# Patient Record
Sex: Female | Born: 1970
Health system: Southern US, Community
[De-identification: ages and names within clinical notes are randomized; demographics above are authoritative.]

## PROBLEM LIST (undated history)

## (undated) ENCOUNTER — Emergency Department (HOSPITAL_BASED_OUTPATIENT_CLINIC_OR_DEPARTMENT_OTHER): Payer: Self-pay

## (undated) ENCOUNTER — Emergency Department (HOSPITAL_BASED_OUTPATIENT_CLINIC_OR_DEPARTMENT_OTHER): Admission: EM | Payer: Self-pay | Source: Home / Self Care

## (undated) DIAGNOSIS — C50919 Malignant neoplasm of unspecified site of unspecified female breast: Secondary | ICD-10-CM

## (undated) DIAGNOSIS — R112 Nausea with vomiting, unspecified: Secondary | ICD-10-CM

## (undated) DIAGNOSIS — E785 Hyperlipidemia, unspecified: Secondary | ICD-10-CM

## (undated) DIAGNOSIS — R87619 Unspecified abnormal cytological findings in specimens from cervix uteri: Secondary | ICD-10-CM

## (undated) DIAGNOSIS — K219 Gastro-esophageal reflux disease without esophagitis: Secondary | ICD-10-CM

## (undated) DIAGNOSIS — Z9889 Other specified postprocedural states: Secondary | ICD-10-CM

## (undated) DIAGNOSIS — E119 Type 2 diabetes mellitus without complications: Secondary | ICD-10-CM

## (undated) DIAGNOSIS — C801 Malignant (primary) neoplasm, unspecified: Secondary | ICD-10-CM

## (undated) DIAGNOSIS — T7840XA Allergy, unspecified, initial encounter: Secondary | ICD-10-CM

## (undated) DIAGNOSIS — J45909 Unspecified asthma, uncomplicated: Secondary | ICD-10-CM

## (undated) HISTORY — DX: Malignant (primary) neoplasm, unspecified: C80.1

## (undated) HISTORY — PX: TUBAL LIGATION: SHX77

## (undated) HISTORY — DX: Unspecified asthma, uncomplicated: J45.909

## (undated) HISTORY — DX: Malignant neoplasm of unspecified site of unspecified female breast: C50.919

## (undated) HISTORY — DX: Hyperlipidemia, unspecified: E78.5

## (undated) HISTORY — PX: APPENDECTOMY: SHX54

## (undated) HISTORY — DX: Gastro-esophageal reflux disease without esophagitis: K21.9

## (undated) HISTORY — DX: Type 2 diabetes mellitus without complications: E11.9

## (undated) HISTORY — DX: Allergy, unspecified, initial encounter: T78.40XA

## (undated) HISTORY — DX: Unspecified abnormal cytological findings in specimens from cervix uteri: R87.619

---

## 1994-07-21 HISTORY — PX: LAPAROSCOPIC SALPINGO OOPHERECTOMY: SHX5927

## 1994-07-21 HISTORY — PX: OTHER SURGICAL HISTORY: SHX169

## 1998-07-21 HISTORY — PX: CHOLECYSTECTOMY: SHX55

## 2005-07-21 DIAGNOSIS — C50919 Malignant neoplasm of unspecified site of unspecified female breast: Secondary | ICD-10-CM

## 2005-07-21 HISTORY — DX: Malignant neoplasm of unspecified site of unspecified female breast: C50.919

## 2006-07-21 HISTORY — PX: BREAST LUMPECTOMY: SHX2

## 2006-11-03 DIAGNOSIS — R1011 Right upper quadrant pain: Secondary | ICD-10-CM

## 2006-11-03 HISTORY — DX: Right upper quadrant pain: R10.11

## 2007-07-22 HISTORY — PX: ENDOMETRIAL ABLATION: SHX621

## 2009-10-03 DIAGNOSIS — D126 Benign neoplasm of colon, unspecified: Secondary | ICD-10-CM | POA: Insufficient documentation

## 2009-10-03 HISTORY — DX: Benign neoplasm of colon, unspecified: D12.6

## 2012-01-06 DIAGNOSIS — Z8041 Family history of malignant neoplasm of ovary: Secondary | ICD-10-CM

## 2012-01-06 DIAGNOSIS — Z803 Family history of malignant neoplasm of breast: Secondary | ICD-10-CM | POA: Insufficient documentation

## 2012-01-06 HISTORY — DX: Family history of malignant neoplasm of breast: Z80.3

## 2012-01-06 HISTORY — DX: Family history of malignant neoplasm of ovary: Z80.41

## 2012-08-02 DIAGNOSIS — G43909 Migraine, unspecified, not intractable, without status migrainosus: Secondary | ICD-10-CM | POA: Insufficient documentation

## 2012-08-02 HISTORY — DX: Migraine, unspecified, not intractable, without status migrainosus: G43.909

## 2012-08-13 DIAGNOSIS — D279 Benign neoplasm of unspecified ovary: Secondary | ICD-10-CM

## 2012-08-13 HISTORY — DX: Benign neoplasm of unspecified ovary: D27.9

## 2013-12-21 DIAGNOSIS — R110 Nausea: Secondary | ICD-10-CM | POA: Insufficient documentation

## 2013-12-21 HISTORY — DX: Nausea: R11.0

## 2014-05-26 ENCOUNTER — Ambulatory Visit (INDEPENDENT_AMBULATORY_CARE_PROVIDER_SITE_OTHER): Payer: BC Managed Care – PPO | Admitting: Medical

## 2014-05-26 ENCOUNTER — Encounter: Payer: Self-pay | Admitting: Medical

## 2014-05-26 ENCOUNTER — Ambulatory Visit (HOSPITAL_BASED_OUTPATIENT_CLINIC_OR_DEPARTMENT_OTHER)
Admission: RE | Admit: 2014-05-26 | Discharge: 2014-05-26 | Disposition: A | Discharge status: Home / Self Care | Payer: BC Managed Care – PPO | Source: Ambulatory Visit | Attending: Medical | Admitting: Medical

## 2014-05-26 VITALS — BP 143/87 | HR 99 | Temp 98.6°F | Ht 64.5 in | Wt 179.6 lb

## 2014-05-26 DIAGNOSIS — R062 Wheezing: Secondary | ICD-10-CM | POA: Insufficient documentation

## 2014-05-26 DIAGNOSIS — R5383 Other fatigue: Secondary | ICD-10-CM

## 2014-05-26 DIAGNOSIS — R079 Chest pain, unspecified: Secondary | ICD-10-CM | POA: Diagnosis not present

## 2014-05-26 DIAGNOSIS — R600 Localized edema: Secondary | ICD-10-CM

## 2014-05-26 LAB — CBC WITH DIFFERENTIAL/PLATELET
BASOS ABS: 0 10*3/uL (ref 0.0–0.1)
Basophils Relative: 0.5 % (ref 0.0–3.0)
EOS ABS: 0.2 10*3/uL (ref 0.0–0.7)
Eosinophils Relative: 2.3 % (ref 0.0–5.0)
HEMATOCRIT: 41.2 % (ref 36.0–46.0)
Hemoglobin: 13.6 g/dL (ref 12.0–15.0)
LYMPHS ABS: 2.9 10*3/uL (ref 0.7–4.0)
Lymphocytes Relative: 30.2 % (ref 12.0–46.0)
MCHC: 33.1 g/dL (ref 30.0–36.0)
MCV: 90.4 fl (ref 78.0–100.0)
MONO ABS: 0.5 10*3/uL (ref 0.1–1.0)
Monocytes Relative: 4.8 % (ref 3.0–12.0)
NEUTROS PCT: 62.2 % (ref 43.0–77.0)
Neutro Abs: 6 10*3/uL (ref 1.4–7.7)
Platelets: 268 10*3/uL (ref 150.0–400.0)
RBC: 4.56 Mil/uL (ref 3.87–5.11)
RDW: 13 % (ref 11.5–15.5)
WBC: 9.6 10*3/uL (ref 4.0–10.5)

## 2014-05-26 LAB — COMPREHENSIVE METABOLIC PANEL
ALBUMIN: 3.4 g/dL — AB (ref 3.5–5.2)
ALK PHOS: 66 U/L (ref 39–117)
ALT: 22 U/L (ref 0–35)
AST: 22 U/L (ref 0–37)
BILIRUBIN TOTAL: 0.3 mg/dL (ref 0.2–1.2)
BUN: 11 mg/dL (ref 6–23)
CO2: 27 mEq/L (ref 19–32)
Calcium: 9.1 mg/dL (ref 8.4–10.5)
Chloride: 106 mEq/L (ref 96–112)
Creatinine, Ser: 0.8 mg/dL (ref 0.4–1.2)
GFR: 84.45 mL/min (ref >60.00)
Glucose, Bld: 103 mg/dL — ABNORMAL HIGH (ref 70–99)
POTASSIUM: 4 meq/L (ref 3.5–5.1)
Sodium: 139 mEq/L (ref 135–145)
TOTAL PROTEIN: 7 g/dL (ref 6.0–8.3)

## 2014-05-26 LAB — TSH: TSH: 2.1 u[IU]/mL (ref 0.35–4.50)

## 2014-05-26 LAB — T4, FREE: Free T4: 0.76 ng/dL (ref 0.60–1.60)

## 2014-05-26 NOTE — Assessment & Plan Note (Signed)
Mild with recent weight gain. Get cbc,cmp, and tsh. Follow labs and recheck in 2 wks.

## 2014-05-26 NOTE — Patient Instructions (Signed)
For your weight gain and pedal edema reported I will order cxr and thyroid studies.  For you recent fatigue, I will get cbc and cmp.  Keep follow up appointment with [facility] for follow up on your ovarian cancer. If you get abdominal bloating or very tight pants with weight gain the would recommend quicker eval with specialsit before December(when routing follow up is).  Follow up here in 2 wks or as needed.

## 2014-05-26 NOTE — Progress Notes (Signed)
Subjective:    Patient ID: [name], female    DOB: 1970/11/06, 43 y.o.   MRN: [id number]  HPI   Pt here first time. Pmh, psh, fh, sh and social history.  Allergies- spring and fall.  Asthma- rare when sick. Controlled since teens. Breast cancer- 2006. Lumpectomy and radiation. Pt gets checked by oncologist every 6 month at [facility]. Ovarian cancer- 1996. Pt has left ovary. Rt ovary removed. GERD- controlled. No symptoms but found on egd on work up for gallbladder.  Pt states recent some swelling hands and her feet. She states gained 15 pounds in 2 wks. Pt states no change in diet. Denies high salt diet. Pt feel mid dyspnea if running around a lot. Lower ext worse than hands. Pt weight 179. 2 wks ago 161. Pt has been weighing herself on same scale.   LMP- 5 yrs ago after   Past Medical History  Diagnosis Date  . Allergy   . Asthma     HX  . Cancer     Breast and Ovarian  . GERD (gastroesophageal reflux disease)     History   Social History  . Marital Status: Married    Spouse Name: N/A    Number of Children: N/A  . Years of Education: N/A   Occupational History  . Not on file.   Social History Main Topics  . Smoking status: Never Smoker   . Smokeless tobacco: Never Used  . Alcohol Use: 0.0 oz/week    0 Not specified per week  . Drug Use: No  . Sexual Activity: Yes   Other Topics Concern  . Not on file   Social History Narrative  . No narrative on file    Past Surgical History  Procedure Laterality Date  . Appendectomy    . Cholecystectomy    . Breast lumpectomy    . Laparoscopic oopherectomy    . Endometrial ablation      5 yrs ago. No menses since.    Family History  Problem Relation Age of Onset  . Diabetes Mother     Allergies not on file  No current outpatient prescriptions on file prior to visit.   No current facility-administered medications on file prior to visit.    BP 143/87 mmHg  Pulse 99  Temp(Src) 98.6 F (37 C) (Oral)   Ht 5' 4.5" (1.638 m)  Wt 179 lb 9.6 oz (81.466 kg)  BMI 30.36 kg/m2  SpO2 99%        Review of Systems  Constitutional: Positive for fatigue and unexpected weight change. Negative for fever and chills.  HENT: Negative.   Respiratory: Negative for cough, chest tightness, shortness of breath and wheezing.        None know but rare occasional winded on acitivity.  Cardiovascular: Negative for chest pain and palpitations.  Gastrointestinal: Negative.   Genitourinary: Negative.   Musculoskeletal: Negative.   Neurological: Negative.   Hematological: Negative for adenopathy. Does not bruise/bleed easily.  Psychiatric/Behavioral: Negative.        Objective:   Physical Exam  Constitutional: She is oriented to person, place, and time. She appears well-developed and well-nourished. No distress.  HENT:  Head: Normocephalic and atraumatic.  Eyes: Conjunctivae are normal. Pupils are equal, round, and reactive to light.  Neck: Normal range of motion. Neck supple. No JVD present. No tracheal deviation present. No thyromegaly present.  Cardiovascular: Normal rate and regular rhythm.   Pulmonary/Chest: Effort normal and breath sounds normal. No stridor.  Abdominal: Soft. Bowel sounds are normal. She exhibits no distension and no mass. There is no tenderness. There is no rebound and no guarding.  No ascites.  Musculoskeletal:  No pedal edema presently. Neg homans signs.  Lymphadenopathy:    She has no cervical adenopathy.  Neurological: She is alert and oriented to person, place, and time. No cranial nerve deficit. Coordination normal.  Skin: Skin is warm and dry. She is not diaphoretic.  Psychiatric: She has a normal mood and affect. Her behavior is normal. Judgment and thought content normal.           Assessment & Plan:

## 2014-05-26 NOTE — Assessment & Plan Note (Signed)
With weight gain will get a cxr today.

## 2014-05-26 NOTE — Progress Notes (Signed)
Pre visit review using our clinic review tool, if applicable. No additional management support is needed unless otherwise documented below in the visit note. 

## 2014-06-09 ENCOUNTER — Encounter: Payer: Self-pay | Admitting: Medical

## 2014-06-09 ENCOUNTER — Ambulatory Visit (INDEPENDENT_AMBULATORY_CARE_PROVIDER_SITE_OTHER): Payer: BC Managed Care – PPO | Admitting: Medical

## 2014-06-09 VITALS — BP 118/83 | HR 98 | Temp 98.0°F | Ht 65.0 in | Wt 175.8 lb

## 2014-06-09 DIAGNOSIS — Z8543 Personal history of malignant neoplasm of ovary: Secondary | ICD-10-CM

## 2014-06-09 DIAGNOSIS — R600 Localized edema: Secondary | ICD-10-CM

## 2014-06-09 DIAGNOSIS — R5383 Other fatigue: Secondary | ICD-10-CM

## 2014-06-09 NOTE — Progress Notes (Signed)
Subjective:    Patient ID: Carolyn Wilson, female    DOB: Nov 23, 1970, 43 y.o.   MRN: [id number]  HPI   Pt states she still is feeling a little tired but better energy. Pt states leg swelling is still present to some degree. Pt is accountant and sits all day. For her fatigue and pedal edema we did cxr and labs. All were normal. Only mild high bs but minimal.  Pt states from last visit to this visit her pants feel about the same. By pt scale at home she has lost 3 pounds. Pt has repeat mri, [place] and lab work in January for follow up of her ovarian cancer history.  Past Medical History  Diagnosis Date  . Allergy   . Asthma     HX  . Cancer     Breast and Ovarian  . GERD (gastroesophageal reflux disease)     History   Social History  . Marital Status: Married    Spouse Name: N/A    Number of Children: N/A  . Years of Education: N/A   Occupational History  . Not on file.   Social History Main Topics  . Smoking status: Never Smoker   . Smokeless tobacco: Never Used  . Alcohol Use: 0.0 oz/week    0 Not specified per week  . Drug Use: No  . Sexual Activity: Yes   Other Topics Concern  . Not on file   Social History Narrative    Past Surgical History  Procedure Laterality Date  . Appendectomy    . Cholecystectomy    . Breast lumpectomy    . Laparoscopic oopherectomy    . Endometrial ablation      5 yrs ago. No menses since.    Family History  Problem Relation Age of Onset  . Diabetes Mother     Not on File  Current Outpatient Prescriptions on File Prior to Visit  Medication Sig Dispense Refill  . esomeprazole (NEXIUM) 40 MG capsule Take 40 mg by mouth daily at 12 noon.     No current facility-administered medications on file prior to visit.    BP 118/83 mmHg  Pulse 98  Temp(Src) 98 F (36.7 C) (Oral)  Ht 5\' 5"  (1.651 m)  Wt 175 lb 12.8 oz (79.742 kg)  BMI 29.25 kg/m2  SpO2 95%         Review of Systems  Constitutional: Positive for  fatigue. Negative for fever and chills.       Better but still mild.  Respiratory: Negative for cough, chest tightness, shortness of breath and wheezing.   Cardiovascular: Negative for chest pain and palpitations.  Gastrointestinal: Negative for nausea, vomiting, abdominal pain, diarrhea and constipation.  Genitourinary: Negative for dysuria and flank pain.  Musculoskeletal:       Pedal edema better.  Hematological: Negative for adenopathy. Does not bruise/bleed easily.  Psychiatric/Behavioral: Negative for suicidal ideas.       Objective:   Physical Exam  Constitutional: She is oriented to person, place, and time. She appears well-developed and well-nourished. No distress.  HENT:  Head: Normocephalic.  Nose: Nose normal.  Eyes: Conjunctivae and EOM are normal. Pupils are equal, round, and reactive to light. Right eye exhibits no discharge. Left eye exhibits no discharge.  Neck: Normal range of motion. Neck supple. No JVD present. No tracheal deviation present. No thyromegaly present.  Cardiovascular: Normal rate, regular rhythm, normal heart sounds and intact distal pulses.  Exam reveals no gallop and no  friction rub.   No murmur heard. Pulmonary/Chest: Effort normal and breath sounds normal. No stridor. No respiratory distress. She has no wheezes. She has no rales. She exhibits no tenderness.  Abdominal: Soft. Bowel sounds are normal. She exhibits no distension and no mass. There is no tenderness. There is no rebound and no guarding.  Musculoskeletal: Normal range of motion. She exhibits no edema or tenderness.  On exam. I really don't see any pedal edema. At very best on palpation maybe 1 + pedal edema pretibial area.  Negative homans signs.  Lymphadenopathy:    She has no cervical adenopathy.  Neurological: She is alert and oriented to person, place, and time. She has normal reflexes. No cranial nerve deficit. Coordination normal.  Skin: Skin is dry. No rash noted. She is not  diaphoretic. No erythema. No pallor.  Psychiatric: She has a normal mood and affect. Her behavior is normal. Judgment and thought content normal.             Assessment & Plan:

## 2014-06-09 NOTE — Progress Notes (Signed)
Pre visit review using our clinic review tool, if applicable. No additional management support is needed unless otherwise documented below in the visit note. 

## 2014-06-09 NOTE — Assessment & Plan Note (Signed)
Your work up for prior fatigue was negative. And cxr done for pedal edema was negative. Since you have ovarian cancer hx, report weight gain recently in short time which sounds abnormal and your pants feel tight, I do want you to contact your specialist and talk to his nurse. See if appointment in January needs to be moved up for imaging studies.

## 2014-06-09 NOTE — Assessment & Plan Note (Signed)
  Some better and lab negative. Only mild bs elevation. Advised pt regarding diet and exercise.

## 2014-06-09 NOTE — Assessment & Plan Note (Signed)
cxr normal and I don't appreciate any significant pedal edema today.

## 2014-06-09 NOTE — Patient Instructions (Signed)
Your work up for prior fatigue was negative. And cxr done for pedal edema was negative. Since you have ovarian cancer hx, report weight gain recently in short time which sounds abnormal and your pants feel tight, I do want you to contact your specialist and talk to his nurse. See if appointment in January needs to be moved up for imaging studies.  You have had recent labs and if you want you could schedule for wellness exam. At that appointment lipid panel could be done. Please get copies of mammograms and papsmears.  Follow up 3 months or as needed.

## 2014-06-20 ENCOUNTER — Ambulatory Visit: Payer: BC Managed Care – PPO | Admitting: Physician Assistant

## 2014-06-20 ENCOUNTER — Telehealth: Payer: Self-pay | Admitting: Medical

## 2014-06-20 NOTE — Telephone Encounter (Signed)
Pt requesting a referral to ENT, please advise

## 2014-06-23 NOTE — Telephone Encounter (Signed)
Called patient left message regarding symptoms she is having for the need to be referred to ENT.

## 2014-06-26 ENCOUNTER — Other Ambulatory Visit: Payer: Self-pay

## 2014-06-27 NOTE — Telephone Encounter (Signed)
Called patient again. (2nd call) Advised I called on 4th but had not received call back as to what symptoms she was having that warranted referral to ENT. Per ES nothing was mentioned to him in OV.

## 2014-08-10 ENCOUNTER — Encounter: Payer: Self-pay | Admitting: Internal Medicine

## 2014-08-10 ENCOUNTER — Encounter (INDEPENDENT_AMBULATORY_CARE_PROVIDER_SITE_OTHER): Payer: Self-pay

## 2014-08-10 ENCOUNTER — Ambulatory Visit (INDEPENDENT_AMBULATORY_CARE_PROVIDER_SITE_OTHER): Payer: BLUE CROSS/BLUE SHIELD | Admitting: Internal Medicine

## 2014-08-10 ENCOUNTER — Other Ambulatory Visit: Payer: Self-pay | Admitting: *Deleted

## 2014-08-10 VITALS — BP 127/85 | HR 93 | Resp 16 | Ht 64.5 in | Wt 184.0 lb

## 2014-08-10 DIAGNOSIS — Z800 Family history of malignant neoplasm of digestive organs: Secondary | ICD-10-CM

## 2014-08-10 DIAGNOSIS — Z1371 Encounter for nonprocreative screening for genetic disease carrier status: Secondary | ICD-10-CM

## 2014-08-10 DIAGNOSIS — Z9889 Other specified postprocedural states: Secondary | ICD-10-CM

## 2014-08-10 DIAGNOSIS — R87619 Unspecified abnormal cytological findings in specimens from cervix uteri: Secondary | ICD-10-CM | POA: Diagnosis not present

## 2014-08-10 DIAGNOSIS — K219 Gastro-esophageal reflux disease without esophagitis: Secondary | ICD-10-CM

## 2014-08-10 DIAGNOSIS — R197 Diarrhea, unspecified: Secondary | ICD-10-CM

## 2014-08-10 DIAGNOSIS — C562 Malignant neoplasm of left ovary: Secondary | ICD-10-CM | POA: Diagnosis not present

## 2014-08-10 DIAGNOSIS — Z0000 Encounter for general adult medical examination without abnormal findings: Secondary | ICD-10-CM

## 2014-08-10 DIAGNOSIS — C50912 Malignant neoplasm of unspecified site of left female breast: Secondary | ICD-10-CM

## 2014-08-10 DIAGNOSIS — T7801XA Anaphylactic reaction due to peanuts, initial encounter: Secondary | ICD-10-CM

## 2014-08-10 MED ORDER — EPINEPHRINE 0.3 MG/0.3ML IJ SOAJ: INTRAMUSCULAR | Status: DC

## 2014-08-10 NOTE — Progress Notes (Signed)
Subjective:    Patient ID: Carolyn Wilson, female    DOB: May 25, 1971, 44 y.o.   MRN: [id number]  HPI Carolyn Wilson is a new pt here for first visit primary care  Former care by Gyn/Onc Dr. Manuela Schwartz [facility]  Has been in [place] for the past 4 months.  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  She is S/P lumpectomy for her breast CA and reports she did not have XRT or CTX.  She has not had any chemoprophylaxis at this point.  (Pt does not have have any of her prior records from Dr. Rosario Jacks at [facility])  She tells me she is BRCA1 and BRCA2 negative.   Also PMH of GERD, and post cholecystectomy Diarrhea, allergic rhinitis  She has anaphylaxis to peanuts'  FHof colon cancer in Father   She is S/P leep for abnormal pap  - again I have no prior records.   She wishes to find a new oncologist here.  Has been going to [facility] .  She reports she alternates breast MRI and 3D mm q 6 months.   She is going back to [facility] for her MRI soon.   She has not had chemoprophylaxis.  Strong FH of breast CA in [place],  Bricelyn and M first cousin   Peanut anaphylaxis not sure if epi-pen is expired  Allergies  Allergen Reactions  . Peanut-Containing Drug Products Anaphylaxis  . Adhesive [Tape]   . Reglan [Metoclopramide] Tinitus  . Ciprofloxacin Rash  . Sulfur Rash   Past Medical History  Diagnosis Date  . Allergy   . Asthma     HX  . Cancer     Breast and Ovarian  . GERD (gastroesophageal reflux disease)    Past Surgical History  Procedure Laterality Date  . Appendectomy    . Cholecystectomy    . Breast lumpectomy    . Laparoscopic oopherectomy    . Endometrial ablation      5 yrs ago. No menses since.   History   Social History  . Marital Status: Married    Spouse Name: N/A    Number of Children: N/A  . Years of Education: N/A   Occupational History  . Not on file.   Social  History Main Topics  . Smoking status: Never Smoker   . Smokeless tobacco: Never Used  . Alcohol Use: 0.0 oz/week    0 Not specified per week  . Drug Use: No  . Sexual Activity: Yes   Other Topics Concern  . Not on file   Social History Narrative   Family History  Problem Relation Age of Onset  . Diabetes Mother   . Colon cancer Father   . Breast cancer Maternal Aunt   . Ovarian cancer Maternal Aunt   . Breast cancer Maternal Grandmother   . Ovarian cancer Cousin   . Colon cancer Cousin   . Melanoma Cousin    Patient Active Problem List   Diagnosis Date Noted  . History of ovarian cancer 06/09/2014  . Fatigue 05/26/2014  . Pedal edema 05/26/2014   Current Outpatient Prescriptions on File Prior to Visit  Medication Sig Dispense Refill  . esomeprazole (NEXIUM) 40 MG capsule Take 40 mg by mouth daily at 12 noon.    . ondansetron (ZOFRAN) 4 MG tablet Take 4  mg by mouth every 8 (eight) hours as needed for nausea or vomiting.     No current facility-administered medications on file prior to visit.       Review of Systems See HPI    Objective:   Physical Exam Physical Exam  Nursing note and vitals reviewed.  Constitutional: She is oriented to person, place, and time. She appears well-developed and well-nourished.  HENT:  Head: Normocephalic and atraumatic.  Cardiovascular: Normal rate and regular rhythm. Exam reveals no gallop and no friction rub.  No murmur heard.  Pulmonary/Chest: Breath sounds normal. She has no wheezes. She has no rales.  Neurological: She is alert and oriented to person, place, and time.  Skin: Skin is warm and dry.  Psychiatric: She has a normal mood and affect. Her behavior is normal.              Assessment & Plan:  Breast CA left  Will have pt sign for prior records.  She will continue with [facility] until new oncologist established here.  Will refer.  Get all labs today  Ovarian CA will refer to "GYN oncology  GERD continue  Nexium  Anaphylaxis to peanuts  Will re-order Epi-pen  Post cholecystectomy diarrhea  On Bentyl for this  Will occasionally have nausea with this and uses Zofaran   FHcolon CA father   She has had colonoscopy [place]  abnromal pap  S/P remote LEEP 1996  Allergic rhintis  Schedule CPE

## 2014-08-13 DIAGNOSIS — Z800 Family history of malignant neoplasm of digestive organs: Secondary | ICD-10-CM

## 2014-08-13 DIAGNOSIS — T7801XA Anaphylactic reaction due to peanuts, initial encounter: Secondary | ICD-10-CM

## 2014-08-13 DIAGNOSIS — R87619 Unspecified abnormal cytological findings in specimens from cervix uteri: Secondary | ICD-10-CM | POA: Insufficient documentation

## 2014-08-13 DIAGNOSIS — C50919 Malignant neoplasm of unspecified site of unspecified female breast: Secondary | ICD-10-CM | POA: Insufficient documentation

## 2014-08-13 DIAGNOSIS — Z1371 Encounter for nonprocreative screening for genetic disease carrier status: Secondary | ICD-10-CM | POA: Insufficient documentation

## 2014-08-13 DIAGNOSIS — J309 Allergic rhinitis, unspecified: Secondary | ICD-10-CM

## 2014-08-13 DIAGNOSIS — R197 Diarrhea, unspecified: Secondary | ICD-10-CM

## 2014-08-13 DIAGNOSIS — K219 Gastro-esophageal reflux disease without esophagitis: Secondary | ICD-10-CM | POA: Insufficient documentation

## 2014-08-13 DIAGNOSIS — Z9889 Other specified postprocedural states: Secondary | ICD-10-CM

## 2014-08-13 DIAGNOSIS — C569 Malignant neoplasm of unspecified ovary: Secondary | ICD-10-CM | POA: Insufficient documentation

## 2014-08-13 HISTORY — DX: Anaphylactic reaction due to peanuts, initial encounter: T78.01XA

## 2014-08-13 HISTORY — DX: Diarrhea, unspecified: R19.7

## 2014-08-13 HISTORY — DX: Allergic rhinitis, unspecified: J30.9

## 2014-08-13 HISTORY — DX: Gastro-esophageal reflux disease without esophagitis: K21.9

## 2014-08-13 HISTORY — DX: Family history of malignant neoplasm of digestive organs: Z80.0

## 2014-08-13 HISTORY — DX: Other specified postprocedural states: Z98.890

## 2014-08-13 NOTE — Patient Instructions (Signed)
Schedule cpe 

## 2014-08-14 ENCOUNTER — Encounter: Payer: Self-pay | Admitting: Internal Medicine

## 2014-08-31 ENCOUNTER — Telehealth: Payer: Self-pay | Admitting: *Deleted

## 2014-08-31 NOTE — Telephone Encounter (Addendum)
Dr Emi Belfast office called to request records needed for New pt consult scheduled 09/01/2014 with Dr. Fermin Schwab. Spoke with Jiles Garter that stated that patient records were not available. Dr Juel Burrow office has not requested them.  Advised that the patient's records would need to be sent to our office before an appt could be made.  Appt reschduled for a later date and Dr. Nevada Crane office notified that records would be needed with pathology reports, etc.  She was going to tell the patient to call our office to reschedule once records received.

## 2014-09-01 ENCOUNTER — Ambulatory Visit: Payer: BLUE CROSS/BLUE SHIELD | Admitting: Gynecology

## 2014-09-15 ENCOUNTER — Telehealth: Payer: Self-pay | Admitting: Hematology & Oncology

## 2014-09-15 NOTE — Telephone Encounter (Signed)
Left vm w NEW PATIENT today to remind them of their appointment with Dr. Ennever. Also, advised them to bring all medication bottles and insurance card information. ° °

## 2014-09-18 ENCOUNTER — Encounter: Payer: Self-pay | Admitting: Hematology & Oncology

## 2014-09-18 ENCOUNTER — Other Ambulatory Visit (HOSPITAL_BASED_OUTPATIENT_CLINIC_OR_DEPARTMENT_OTHER): Payer: BLUE CROSS/BLUE SHIELD | Admitting: Lab

## 2014-09-18 ENCOUNTER — Ambulatory Visit (HOSPITAL_BASED_OUTPATIENT_CLINIC_OR_DEPARTMENT_OTHER): Payer: BLUE CROSS/BLUE SHIELD | Admitting: Hematology & Oncology

## 2014-09-18 ENCOUNTER — Ambulatory Visit: Payer: BLUE CROSS/BLUE SHIELD

## 2014-09-18 VITALS — BP 120/82 | HR 95 | Temp 98.4°F | Resp 14 | Ht 64.0 in | Wt 180.0 lb

## 2014-09-18 DIAGNOSIS — L989 Disorder of the skin and subcutaneous tissue, unspecified: Secondary | ICD-10-CM

## 2014-09-18 DIAGNOSIS — C50912 Malignant neoplasm of unspecified site of left female breast: Secondary | ICD-10-CM

## 2014-09-18 DIAGNOSIS — Z8543 Personal history of malignant neoplasm of ovary: Secondary | ICD-10-CM

## 2014-09-18 DIAGNOSIS — Z803 Family history of malignant neoplasm of breast: Secondary | ICD-10-CM

## 2014-09-18 DIAGNOSIS — C561 Malignant neoplasm of right ovary: Secondary | ICD-10-CM

## 2014-09-18 DIAGNOSIS — Z8041 Family history of malignant neoplasm of ovary: Secondary | ICD-10-CM

## 2014-09-18 LAB — CBC WITH DIFFERENTIAL (CANCER CENTER ONLY)
BASO#: 0.1 10*3/uL (ref 0.0–0.2)
BASO%: 0.6 % (ref 0.0–2.0)
EOS%: 2 % (ref 0.0–7.0)
Eosinophils Absolute: 0.2 10*3/uL (ref 0.0–0.5)
HEMATOCRIT: 42.3 % (ref 34.8–46.6)
HEMOGLOBIN: 14.4 g/dL (ref 11.6–15.9)
LYMPH#: 3.3 10*3/uL (ref 0.9–3.3)
LYMPH%: 36.7 % (ref 14.0–48.0)
MCH: 30.1 pg (ref 26.0–34.0)
MCHC: 34 g/dL (ref 32.0–36.0)
MCV: 89 fL (ref 81–101)
MONO#: 0.4 10*3/uL (ref 0.1–0.9)
MONO%: 4.5 % (ref 0.0–13.0)
NEUT#: 5.1 10*3/uL (ref 1.5–6.5)
NEUT%: 56.2 % (ref 39.6–80.0)
Platelets: 319 10*3/uL (ref 145–400)
RBC: 4.78 10*6/uL (ref 3.70–5.32)
RDW: 12.4 % (ref 11.1–15.7)
WBC: 9 10*3/uL (ref 3.9–10.0)

## 2014-09-18 NOTE — Progress Notes (Signed)
Referral MD  Reason for Referral: History of Sertoli-Leydig tumor of the left ovary   Chief Complaint  Patient presents with  . NEW PATIENT  : I just moved here from [place]. I need a oncologist.  HPI: Mrs. Martone is a very charming 44 year old white female. She is from [place] originally area and she and her family moved down to [place] recently. Per she's been getting follow-up at the [facility].  She has a very strong family history of breast and ovarian cancer. A paternal aunt had breast cancer at age 59 and a cousin died of breast cancer in her 82s.  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 She underwent left salpingo-oophorectomy and chemotherapy.  She had endometrial ablation in 2010.  She was tested for BRCA 1/2 and p53. These were negative.  She had a lumpectomy for a tumor in the left breast in 2007.  She's been getting MRIs and mammograms alternating every 6 months. She's been getting CA 125 tests with her appointments up in [place].  She was not interested in taking tamoxifen for breast cancer reduction.  She now is living down in [facility], we are seeing her to establish her oncology care locally.  She feels well. 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  She has not had any problems with skin issues. She's had no skin lesions removed.                                                                  She has had her gallbladder taken out. I think she's had her appendix removed.  She's had a problem with bowels or bladder. She does have a pancreatic issue in which she occasionally gets pancreatic duct blockage. She said that a stent was attempted without success. She said that last time she was hospitalized for this was 3 years ago.  She now is seeing Dr. Coralyn Mark downstairs for her internal medicine follow-up.  Overall, her performance status is  ECOG 0. She exercises for 5 times a day.                                                                                                             Past Medical History  Diagnosis Date  . Allergy   . Asthma     HX  . Cancer     Breast and Ovarian  . GERD (gastroesophageal reflux disease)   :  Past Surgical History  Procedure Laterality Date  . Appendectomy    . Cholecystectomy    . Breast lumpectomy    . Laparoscopic oopherectomy    . Endometrial ablation      5 yrs ago. No menses since.  :  Current outpatient prescriptions:  .  dicyclomine (BENTYL) 10 MG capsule, Take 10 mg by mouth 4 times daily (before meals and nightly),, Disp: , Rfl:  .  EPINEPHrine 0.3 mg/0.3 mL IJ SOAJ injection, Inject once into muscle for any allergic reaction to peanut exposure, Disp: 1 Device, Rfl: 1 .  esomeprazole (NEXIUM) 40 MG capsule, Take 40 mg by mouth daily at 12 noon., Disp: , Rfl:  .  Multiple Vitamin (MULTIVITAMIN) tablet, Take 1 tablet by mouth daily., Disp: , Rfl:  .  ondansetron (ZOFRAN) 4 MG tablet, Take 4 mg by mouth every 8 (eight) hours as needed for nausea or vomiting., Disp: , Rfl: :  :  Allergies  Allergen Reactions  . Peanut-Containing Drug Products Anaphylaxis  . Adhesive [Tape]   . Reglan [Metoclopramide] Tinitus  . Ciprofloxacin Rash  . Sulfur Rash  :  Family History  Problem Relation Age of Onset  . Diabetes Mother   . Colon cancer Father   . Breast cancer Maternal Aunt   . Ovarian cancer Maternal Aunt   . Breast cancer Maternal Grandmother   . Ovarian cancer Cousin   . Colon cancer Cousin   . Melanoma Cousin   :  History   Social History  . Marital Status: Married    Spouse Name: N/A  . Number of Children: N/A  . Years of Education: N/A   Occupational History  . Not on file.   Social History Main Topics  . Smoking status: Never Smoker   . Smokeless tobacco: Never Used     Comment: NEVER USED TOBACCO  . Alcohol Use: 0.0 oz/week    0  Standard drinks or equivalent per week  . Drug Use: No  . Sexual Activity: Yes   Other Topics Concern  . Not on file   Social History Narrative  :  Pertinent items are noted in HPI.  Exam: '@IPVITALS' @ Well-developed and well-nourished white female in no obvious distress. Vital signs show a temperature of 98.4. Pulse 95. Blood pressure 120/82. Weight is 180 pounds. Head and neck exam shows no ocular or oral lesions. She has no palpable cervical or supraclavicular lymph nodes. Lungs are clear. Cardiac exam regular rate and rhythm with no murmurs, rubs or bruits. Abdomen is soft. She has good bowel sounds. There is no fluid wave. She has a laparoscopy scars that are well-healed. Breast exam shows right breast with no masses, edema or erythema. There is no right axillary adenopathy. Left breast shows a well-healed lumpectomy at about the 2:00 position. No distinct masses noted in the left breast. There is no left axillary adenopathy. Back exam shows no tenderness over the spine, ribs or hips. Extremities shows no clubbing, cyanosis or edema. Skin exam shows no suspicious hyperpigmented lesions. There is a small dark lesion on her mid back just to the right of the spine. This probably measures about 3 mm. Neurological exam shows no focal deficits.    Recent Labs  09/18/14 1507  WBC 9.0  HGB 14.4  HCT 42.3  PLT 319   No results for input(s): NA, K, CL, CO2, GLUCOSE, BUN, CREATININE, CALCIUM in the last 72 hours.  l Bloodsmear review: none  Pathology:None    Assessment and Plan: [name] is a 44 year old premenopausal female. 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 This was back in 1996. She has no obvious history of breast cancer.  She certainly is at risk for disease. She has a strong family  history.  We will go ahead and set her up with a mammogram in July. She'll alternate mammograms with MRIs.  I talked to her about doing an extensive genetic  analysis. I talked her about the Myriad genetic profile that we can run. This test about 25 different mutations. I think it would be worthwhile to think about this. Again, she was tested for BRCA 1/2 an p53 which were normal.  She does have this skin lesion on her back. We will have to watch this closely.  I will plan to get her back in 6 months time.  I spent about 45 minutes with her today.

## 2014-09-19 ENCOUNTER — Telehealth: Payer: Self-pay | Admitting: *Deleted

## 2014-09-19 LAB — COMPREHENSIVE METABOLIC PANEL
ALK PHOS: 75 U/L (ref 39–117)
ALT: 20 U/L (ref 0–35)
AST: 20 U/L (ref 0–37)
Albumin: 4.4 g/dL (ref 3.5–5.2)
BUN: 8 mg/dL (ref 6–23)
CALCIUM: 9.9 mg/dL (ref 8.4–10.5)
CO2: 27 mEq/L (ref 19–32)
Chloride: 100 mEq/L (ref 96–112)
Creatinine, Ser: 0.83 mg/dL (ref 0.50–1.10)
GLUCOSE: 127 mg/dL — AB (ref 70–99)
POTASSIUM: 4.3 meq/L (ref 3.5–5.3)
SODIUM: 138 meq/L (ref 135–145)
TOTAL PROTEIN: 7.1 g/dL (ref 6.0–8.3)
Total Bilirubin: 0.4 mg/dL (ref 0.2–1.2)

## 2014-09-19 LAB — VITAMIN D 25 HYDROXY (VIT D DEFICIENCY, FRACTURES): Vit D, 25-Hydroxy: 25 ng/mL — ABNORMAL LOW (ref 30–100)

## 2014-09-19 LAB — CA 125: CA 125: 13 U/mL (ref <35)

## 2014-09-19 NOTE — Telephone Encounter (Addendum)
Message given. Patient will start supplement.   ----- Message from Carolyn Napoleon, MD sent at 09/19/2014  8:15 AM EST ----- Call - vit D is low!!!  Must be taking 2000units a day!!!  [name]

## 2014-09-20 ENCOUNTER — Ambulatory Visit: Payer: BLUE CROSS/BLUE SHIELD | Admitting: Internal Medicine

## 2014-09-22 ENCOUNTER — Telehealth: Payer: Self-pay | Admitting: Obstetrics & Gynecology

## 2014-09-22 NOTE — Telephone Encounter (Signed)
Called patient and left message to call back to schedule a new patient Dr. [name] Heck will see on behalf of Dr. Marin Olp.

## 2014-09-26 ENCOUNTER — Other Ambulatory Visit: Payer: Self-pay | Admitting: Hematology & Oncology

## 2014-09-26 DIAGNOSIS — Z1231 Encounter for screening mammogram for malignant neoplasm of breast: Secondary | ICD-10-CM

## 2014-09-28 ENCOUNTER — Encounter: Payer: Self-pay | Admitting: Hematology & Oncology

## 2014-10-04 ENCOUNTER — Telehealth: Payer: Self-pay | Admitting: Hematology & Oncology

## 2014-10-04 NOTE — Telephone Encounter (Addendum)
Pt called is getting disc of her scans sent to her, and said if I call [phone] at [facility] they will send the rest of the medical records. Bessie at [facility] will resend the records.

## 2014-10-05 ENCOUNTER — Encounter: Payer: Self-pay | Admitting: Hematology & Oncology

## 2014-10-05 ENCOUNTER — Telehealth: Payer: Self-pay | Admitting: Hematology & Oncology

## 2014-10-05 NOTE — Telephone Encounter (Signed)
Pt came in brought release of info to send to [facility] to mail disc of scans. I faxed it to [phone], phone [phone]. She also filled out our release of information for [place]. I scanned everything into Epic.

## 2014-10-11 ENCOUNTER — Encounter: Payer: Self-pay | Admitting: Hematology & Oncology

## 2014-10-31 ENCOUNTER — Encounter: Payer: Self-pay | Admitting: *Deleted

## 2014-11-03 ENCOUNTER — Telehealth: Payer: Self-pay | Admitting: *Deleted

## 2014-11-03 NOTE — Telephone Encounter (Signed)
Unable to reach patient at time of Pre-Visit Call.  Left message for patient to return call when available.    

## 2014-11-06 ENCOUNTER — Ambulatory Visit (INDEPENDENT_AMBULATORY_CARE_PROVIDER_SITE_OTHER): Payer: BLUE CROSS/BLUE SHIELD | Admitting: Medical

## 2014-11-06 ENCOUNTER — Telehealth: Payer: Self-pay | Admitting: Medical

## 2014-11-06 ENCOUNTER — Encounter: Payer: Self-pay | Admitting: Medical

## 2014-11-06 VITALS — BP 132/92 | HR 95 | Temp 98.1°F | Ht 64.0 in | Wt 178.6 lb

## 2014-11-06 DIAGNOSIS — R7989 Other specified abnormal findings of blood chemistry: Secondary | ICD-10-CM | POA: Diagnosis not present

## 2014-11-06 DIAGNOSIS — Z0000 Encounter for general adult medical examination without abnormal findings: Secondary | ICD-10-CM

## 2014-11-06 DIAGNOSIS — Z23 Encounter for immunization: Secondary | ICD-10-CM

## 2014-11-06 LAB — POCT URINALYSIS DIPSTICK
BILIRUBIN UA: NEGATIVE
GLUCOSE UA: NEGATIVE
Ketones, UA: NEGATIVE
LEUKOCYTES UA: NEGATIVE
Nitrite, UA: NEGATIVE
Protein, UA: NEGATIVE
RBC UA: NEGATIVE
Spec Grav, UA: >=1.03
Urobilinogen, UA: 4
pH, UA: 6

## 2014-11-06 LAB — CBC WITH DIFFERENTIAL/PLATELET
BASOS ABS: 0 10*3/uL (ref 0.0–0.1)
Basophils Relative: 0.5 % (ref 0.0–3.0)
EOS PCT: 2.1 % (ref 0.0–5.0)
Eosinophils Absolute: 0.2 10*3/uL (ref 0.0–0.7)
HCT: 42.5 % (ref 36.0–46.0)
Hemoglobin: 14.5 g/dL (ref 12.0–15.0)
Lymphocytes Relative: 26.4 % (ref 12.0–46.0)
Lymphs Abs: 2.1 10*3/uL (ref 0.7–4.0)
MCHC: 34.2 g/dL (ref 30.0–36.0)
MCV: 87.5 fl (ref 78.0–100.0)
MONO ABS: 0.3 10*3/uL (ref 0.1–1.0)
Monocytes Relative: 4.3 % (ref 3.0–12.0)
Neutro Abs: 5.4 10*3/uL (ref 1.4–7.7)
Neutrophils Relative %: 66.7 % (ref 43.0–77.0)
PLATELETS: 291 10*3/uL (ref 150.0–400.0)
RBC: 4.86 Mil/uL (ref 3.87–5.11)
RDW: 13.5 % (ref 11.5–15.5)
WBC: 8 10*3/uL (ref 4.0–10.5)

## 2014-11-06 LAB — COMPREHENSIVE METABOLIC PANEL
ALK PHOS: 84 U/L (ref 39–117)
ALT: 37 U/L — ABNORMAL HIGH (ref 0–35)
AST: 27 U/L (ref 0–37)
Albumin: 4.2 g/dL (ref 3.5–5.2)
BUN: 10 mg/dL (ref 6–23)
CO2: 28 mEq/L (ref 19–32)
CREATININE: 0.82 mg/dL (ref 0.40–1.20)
Calcium: 10.1 mg/dL (ref 8.4–10.5)
Chloride: 101 mEq/L (ref 96–112)
GFR: 80.72 mL/min (ref >60.00)
GLUCOSE: 132 mg/dL — AB (ref 70–99)
Potassium: 4.2 mEq/L (ref 3.5–5.1)
Sodium: 139 mEq/L (ref 135–145)
Total Bilirubin: 0.5 mg/dL (ref 0.2–1.2)
Total Protein: 7.3 g/dL (ref 6.0–8.3)

## 2014-11-06 LAB — LIPID PANEL
Cholesterol: 248 mg/dL — ABNORMAL HIGH (ref 0–200)
HDL: 40.4 mg/dL (ref >39.00)
NONHDL: 207.6
Total CHOL/HDL Ratio: 6
Triglycerides: 259 mg/dL — ABNORMAL HIGH (ref 0.0–149.0)
VLDL: 51.8 mg/dL — ABNORMAL HIGH (ref 0.0–40.0)

## 2014-11-06 LAB — TSH: TSH: 2.98 u[IU]/mL (ref 0.35–4.50)

## 2014-11-06 LAB — LDL CHOLESTEROL, DIRECT: Direct LDL: 165 mg/dL

## 2014-11-06 MED ORDER — SIMVASTATIN 20 MG PO TABS: 20.0000 mg | ORAL_TABLET | Freq: Every day | ORAL | Status: DC

## 2014-11-06 NOTE — Telephone Encounter (Signed)
rx simvistatin

## 2014-11-06 NOTE — Assessment & Plan Note (Signed)
Cbc, tsh, lipid, cmp. Tdap today. Pt had HIV screening in 2005. Will see gyn upcoming 1-2 weeks.

## 2014-11-06 NOTE — Progress Notes (Signed)
Pre visit review using our clinic review tool, if applicable. No additional management support is needed unless otherwise documented below in the visit note. 

## 2014-11-06 NOTE — Progress Notes (Signed)
Subjective:    Patient ID: Carolyn Wilson, female    DOB: 04-05-1971, 44 y.o.   MRN: [id number]  HPI  Pt in for physical. Pt states work and her insurance her to get a physical.    Pt has been exercising. Every day walks or runs. Pt diet good. Eats very healthy. No caffeine beverages. No smoke. No alcohol.  Pt has seen Dr. Marin Olp due to her history. Pt did follow up with oncologist after I last saw her.(hx of ovraian cancer)  Pt will see gynecologist for pap smear was 2 yrs ago. Last mammogram last July. Which was negative. Next one schedule in august.  Pt med records have been sent to Dr. Marin Olp.  Pt states HIV screening done in 2005 and negative.   Pt not sure when tdap done. I don't think we have full records. Pt states last time was [place]. So she is willing to get today.  Pt has some mild allergies recently.            Review of Systems  Constitutional: Negative for fever, chills and fatigue.  HENT: Positive for congestion and rhinorrhea. Negative for ear discharge, ear pain, nosebleeds, postnasal drip, sinus pressure, sore throat and trouble swallowing.   Respiratory: Negative for cough, chest tightness, shortness of breath and wheezing.   Cardiovascular: Negative for chest pain and palpitations.  Gastrointestinal: Negative for nausea, vomiting, abdominal pain, diarrhea and constipation.  Genitourinary: Negative for dysuria and flank pain.  Musculoskeletal: Negative for back pain.  Neurological: Negative for dizziness, tremors, seizures, syncope, numbness and headaches.  Hematological: Negative for adenopathy. Does not bruise/bleed easily.  Psychiatric/Behavioral: Negative for suicidal ideas and dysphoric mood. The patient is not nervous/anxious.    Past Medical History  Diagnosis Date  . Allergy   . Asthma     HX  . Cancer     Breast and Ovarian  . GERD (gastroesophageal reflux disease)     History   Social History  . Marital Status:  Married    Spouse Name: N/A  . Number of Children: N/A  . Years of Education: N/A   Occupational History  . Not on file.   Social History Main Topics  . Smoking status: Never Smoker   . Smokeless tobacco: Never Used     Comment: NEVER USED TOBACCO  . Alcohol Use: 0.0 oz/week    0 Standard drinks or equivalent per week  . Drug Use: No  . Sexual Activity: Yes   Other Topics Concern  . Not on file   Social History Narrative    Past Surgical History  Procedure Laterality Date  . Appendectomy    . Cholecystectomy    . Breast lumpectomy    . Laparoscopic oopherectomy    . Endometrial ablation      5 yrs ago. No menses since.    Family History  Problem Relation Age of Onset  . Diabetes Mother   . Colon cancer Father   . Breast cancer Maternal Aunt   . Ovarian cancer Maternal Aunt   . Breast cancer Maternal Grandmother   . Ovarian cancer Cousin   . Colon cancer Cousin   . Melanoma Cousin     Allergies  Allergen Reactions  . Peanut-Containing Drug Products Anaphylaxis  . Adhesive [Tape]   . Reglan [Metoclopramide] Tinitus  . Ciprofloxacin Rash  . Sulfur Rash    Current Outpatient Prescriptions on File Prior to Visit  Medication Sig Dispense Refill  . dicyclomine (BENTYL)  10 MG capsule Take 10 mg by mouth 4 times daily (before meals and nightly),    . EPINEPHrine 0.3 mg/0.3 mL IJ SOAJ injection Inject once into muscle for any allergic reaction to peanut exposure 1 Device 1  . esomeprazole (NEXIUM) 40 MG capsule Take 40 mg by mouth daily at 12 noon.    . ondansetron (ZOFRAN) 4 MG tablet Take 4 mg by mouth every 8 (eight) hours as needed for nausea or vomiting.     No current facility-administered medications on file prior to visit.    BP 132/92 mmHg  Pulse 95  Temp(Src) 98.1 F (36.7 C) (Oral)  Ht 5\' 4"  (1.626 m)  Wt 178 lb 9.6 oz (81.012 kg)  BMI 30.64 kg/m2  SpO2 97%      Objective:   Physical Exam  General   Mental Status- Alert.   Orientation-Oriented x3. Build and Nutrition Well Nourished and Well Developed.  Skin General: Normal.  Color- Normal color. Moisture- Dry.Temperature warm. Lesions: No suspicious lesions  Head, Eyes, Ears, Nose, Thoat Ears-Normal. Auditory Canal-Bilateral-Normal. Tympanic Membrane- Bilateral-Normal. Eyes Fundi- Bilateral-Normal. Pupil- Bilateral- Direct reaction to light normal. Nose & Sinuses- mild boggy turbinates. +pnd.  Neck Neck- No Bruits or Masses. Thyroid- Normal. No thyromegaly or nodules.  Breast Deffered to gyn. [place].  Chest and Lung Exam  Percussion: Quality and Intensity:-Percussion normal. Percussion of chest reveals- No Dullness. Palpation of the chest reveals- Non-tender. Auscultation: Breath sounds-Normal. Adventitious  Sounds:No adventitious   Vaginal Deferred to gyn upcoming.  Abdomen Inspection:- Inspection Normal. Inspection of abdomen reveals- No Hernias. Palpation/Percussion: Palpation and Percussion of the abdomen reveal- Non Tender and No Palpable masses. Liver: Other Characteristics- No Hepatmegaly Spleen:Other Characteristics- No Splenomegaly. Auscultation: Auscultation of the abdomen reveals-Bowel sounds normal and No Abdominal bruits.     Neurologic Mental Status- Normal Cranial Nerves- Normal Bilaterally, Motor- Normal. Strength:5/5 normal muscle strength- All Muscles.  Musculoskeletal Global Assessment General- Joints show full range of motion without obvious deformity and Normal muscle mass. Strength 5/5 in upper and lower extremities.  Lymphatic General lymphatics Description-No Generalized lymphadenopathy.        Assessment & Plan:

## 2014-11-06 NOTE — Patient Instructions (Addendum)
Wellness examination Cbc, tsh, lipid, cmp. Tdap today. Pt had HIV screening in 2005. Will see gyn upcoming 1-2 weeks.     UA as well. For your mild allergies on review. Recommend flonase otc and otc antihistamine such as zyrtec, allegra or claritin.  Preventive Care for Adults A healthy lifestyle and preventive care can promote health and wellness. Preventive health guidelines for women include the following key practices.  A routine yearly physical is a good way to check with your health care provider about your health and preventive screening. It is a chance to share any concerns and updates on your health and to receive a thorough exam.  Visit your dentist for a routine exam and preventive care every 6 months. Brush your teeth twice a day and floss once a day. Good oral hygiene prevents tooth decay and gum disease.  The frequency of eye exams is based on your age, health, family medical history, use of contact lenses, and other factors. Follow your health care provider's recommendations for frequency of eye exams.  Eat a healthy diet. Foods like vegetables, fruits, whole grains, low-fat dairy products, and lean protein foods contain the nutrients you need without too many calories. Decrease your intake of foods high in solid fats, added sugars, and salt. Eat the right amount of calories for you.Get information about a proper diet from your health care provider, if necessary.  Regular physical exercise is one of the most important things you can do for your health. Most adults should get at least 150 minutes of moderate-intensity exercise (any activity that increases your heart rate and causes you to sweat) each week. In addition, most adults need muscle-strengthening exercises on 2 or more days a week.  Maintain a healthy weight. The body mass index (BMI) is a screening tool to identify possible weight problems. It provides an estimate of body fat based on height and weight. Your health  care provider can find your BMI and can help you achieve or maintain a healthy weight.For adults 20 years and older:  A BMI below 18.5 is considered underweight.  A BMI of 18.5 to 24.9 is normal.  A BMI of 25 to 29.9 is considered overweight.  A BMI of 30 and above is considered obese.  Maintain normal blood lipids and cholesterol levels by exercising and minimizing your intake of saturated fat. Eat a balanced diet with plenty of fruit and vegetables. Blood tests for lipids and cholesterol should begin at age 23 and be repeated every 5 years. If your lipid or cholesterol levels are high, you are over 50, or you are at high risk for heart disease, you may need your cholesterol levels checked more frequently.Ongoing high lipid and cholesterol levels should be treated with medicines if diet and exercise are not working.  If you smoke, find out from your health care provider how to quit. If you do not use tobacco, do not start.  Lung cancer screening is recommended for adults aged 74-80 years who are at high risk for developing lung cancer because of a history of smoking. A yearly low-dose CT scan of the lungs is recommended for people who have at least a 30-pack-year history of smoking and are a current smoker or have quit within the past 15 years. A pack year of smoking is smoking an average of 1 pack of cigarettes a day for 1 year (for example: 1 pack a day for 30 years or 2 packs a day for 15 years). Yearly screening should  continue until the smoker has stopped smoking for at least 15 years. Yearly screening should be stopped for people who develop a health problem that would prevent them from having lung cancer treatment.  If you are pregnant, do not drink alcohol. If you are breastfeeding, be very cautious about drinking alcohol. If you are not pregnant and choose to drink alcohol, do not have more than 1 drink per day. One drink is considered to be 12 ounces (355 mL) of beer, 5 ounces (148 mL)  of wine, or 1.5 ounces (44 mL) of liquor.  Avoid use of street drugs. Do not share needles with anyone. Ask for help if you need support or instructions about stopping the use of drugs.  High blood pressure causes heart disease and increases the risk of stroke. Your blood pressure should be checked at least every 1 to 2 years. Ongoing high blood pressure should be treated with medicines if weight loss and exercise do not work.  If you are 12-56 years old, ask your health care provider if you should take aspirin to prevent strokes.  Diabetes screening involves taking a blood sample to check your fasting blood sugar level. This should be done once every 3 years, after age 63, if you are within normal weight and without risk factors for diabetes. Testing should be considered at a younger age or be carried out more frequently if you are overweight and have at least 1 risk factor for diabetes.  Breast cancer screening is essential preventive care for women. You should practice "breast self-awareness." This means understanding the normal appearance and feel of your breasts and may include breast self-examination. Any changes detected, no matter how small, should be reported to a health care provider. Women in their 72s and 30s should have a clinical breast exam (CBE) by a health care provider as part of a regular health exam every 1 to 3 years. After age 69, women should have a CBE every year. Starting at age 67, women should consider having a mammogram (breast X-ray test) every year. Women who have a family history of breast cancer should talk to their health care provider about genetic screening. Women at a high risk of breast cancer should talk to their health care providers about having an MRI and a mammogram every year.  Breast cancer gene (BRCA)-related cancer risk assessment is recommended for women who have family members with BRCA-related cancers. BRCA-related cancers include breast, ovarian, tubal,  and peritoneal cancers. Having family members with these cancers may be associated with an increased risk for harmful changes (mutations) in the breast cancer genes BRCA1 and BRCA2. Results of the assessment will determine the need for genetic counseling and BRCA1 and BRCA2 testing.  Routine pelvic exams to screen for cancer are no longer recommended for nonpregnant women who are considered low risk for cancer of the pelvic organs (ovaries, uterus, and vagina) and who do not have symptoms. Ask your health care provider if a screening pelvic exam is right for you.  If you have had past treatment for cervical cancer or a condition that could lead to cancer, you need Pap tests and screening for cancer for at least 20 years after your treatment. If Pap tests have been discontinued, your risk factors (such as having a new sexual partner) need to be reassessed to determine if screening should be resumed. Some women have medical problems that increase the chance of getting cervical cancer. In these cases, your health care provider may recommend more  frequent screening and Pap tests.  The HPV test is an additional test that may be used for cervical cancer screening. The HPV test looks for the virus that can cause the cell changes on the cervix. The cells collected during the Pap test can be tested for HPV. The HPV test could be used to screen women aged 2 years and older, and should be used in women of any age who have unclear Pap test results. After the age of 36, women should have HPV testing at the same frequency as a Pap test.  Colorectal cancer can be detected and often prevented. Most routine colorectal cancer screening begins at the age of 25 years and continues through age 37 years. However, your health care provider may recommend screening at an earlier age if you have risk factors for colon cancer. On a yearly basis, your health care provider may provide home test kits to check for hidden blood in the  stool. Use of a small camera at the end of a tube, to directly examine the colon (sigmoidoscopy or colonoscopy), can detect the earliest forms of colorectal cancer. Talk to your health care provider about this at age 80, when routine screening begins. Direct exam of the colon should be repeated every 5-10 years through age 72 years, unless early forms of pre-cancerous polyps or small growths are found.  People who are at an increased risk for hepatitis B should be screened for this virus. You are considered at high risk for hepatitis B if:  You were born in a country where hepatitis B occurs often. Talk with your health care provider about which countries are considered high risk.  Your parents were born in a high-risk country and you have not received a shot to protect against hepatitis B (hepatitis B vaccine).  You have HIV or AIDS.  You use needles to inject street drugs.  You live with, or have sex with, someone who has hepatitis B.  You get hemodialysis treatment.  You take certain medicines for conditions like cancer, organ transplantation, and autoimmune conditions.  Hepatitis C blood testing is recommended for all people born from [birth year] through 1965 and any individual with known risks for hepatitis C.  Practice safe sex. Use condoms and avoid high-risk sexual practices to reduce the spread of sexually transmitted infections (STIs). STIs include gonorrhea, chlamydia, syphilis, trichomonas, herpes, HPV, and human immunodeficiency virus (HIV). Herpes, HIV, and HPV are viral illnesses that have no cure. They can result in disability, cancer, and death.  You should be screened for sexually transmitted illnesses (STIs) including gonorrhea and chlamydia if:  You are sexually active and are younger than 24 years.  You are older than 24 years and your health care provider tells you that you are at risk for this type of infection.  Your sexual activity has changed since you were last  screened and you are at an increased risk for chlamydia or gonorrhea. Ask your health care provider if you are at risk.  If you are at risk of being infected with HIV, it is recommended that you take a prescription medicine daily to prevent HIV infection. This is called preexposure prophylaxis (PrEP). You are considered at risk if:  You are a heterosexual woman, are sexually active, and are at increased risk for HIV infection.  You take drugs by injection.  You are sexually active with a partner who has HIV.  Talk with your health care provider about whether you are at high risk of  being infected with HIV. If you choose to begin PrEP, you should first be tested for HIV. You should then be tested every 3 months for as long as you are taking PrEP.  Osteoporosis is a disease in which the bones lose minerals and strength with aging. This can result in serious bone fractures or breaks. The risk of osteoporosis can be identified using a bone density scan. Women ages 87 years and over and women at risk for fractures or osteoporosis should discuss screening with their health care providers. Ask your health care provider whether you should take a calcium supplement or vitamin D to reduce the rate of osteoporosis.  Menopause can be associated with physical symptoms and risks. Hormone replacement therapy is available to decrease symptoms and risks. You should talk to your health care provider about whether hormone replacement therapy is right for you.  Use sunscreen. Apply sunscreen liberally and repeatedly throughout the day. You should seek shade when your shadow is shorter than you. Protect yourself by wearing long sleeves, pants, a wide-brimmed hat, and sunglasses year round, whenever you are outdoors.  Once a month, do a whole body skin exam, using a mirror to look at the skin on your back. Tell your health care provider of new moles, moles that have irregular borders, moles that are larger than a pencil  eraser, or moles that have changed in shape or color.  Stay current with required vaccines (immunizations).  Influenza vaccine. All adults should be immunized every year.  Tetanus, diphtheria, and acellular pertussis (Td, Tdap) vaccine. Pregnant women should receive 1 dose of Tdap vaccine during each pregnancy. The dose should be obtained regardless of the length of time since the last dose. Immunization is preferred during the 27th-36th week of gestation. An adult who has not previously received Tdap or who does not know her vaccine status should receive 1 dose of Tdap. This initial dose should be followed by tetanus and diphtheria toxoids (Td) booster doses every 10 years. Adults with an unknown or incomplete history of completing a 3-dose immunization series with Td-containing vaccines should begin or complete a primary immunization series including a Tdap dose. Adults should receive a Td booster every 10 years.  Varicella vaccine. An adult without evidence of immunity to varicella should receive 2 doses or a second dose if she has previously received 1 dose. Pregnant females who do not have evidence of immunity should receive the first dose after pregnancy. This first dose should be obtained before leaving the health care facility. The second dose should be obtained 4-8 weeks after the first dose.  Human papillomavirus (HPV) vaccine. Females aged 13-26 years who have not received the vaccine previously should obtain the 3-dose series. The vaccine is not recommended for use in pregnant females. However, pregnancy testing is not needed before receiving a dose. If a female is found to be pregnant after receiving a dose, no treatment is needed. In that case, the remaining doses should be delayed until after the pregnancy. Immunization is recommended for any person with an immunocompromised condition through the age of 55 years if she did not get any or all doses earlier. During the 3-dose series, the  second dose should be obtained 4-8 weeks after the first dose. The third dose should be obtained 24 weeks after the first dose and 16 weeks after the second dose.  Zoster vaccine. One dose is recommended for adults aged 50 years or older unless certain conditions are present.  Measles, mumps, and  rubella (MMR) vaccine. Adults born before 55 generally are considered immune to measles and mumps. Adults born in [birth year] or later should have 1 or more doses of MMR vaccine unless there is a contraindication to the vaccine or there is laboratory evidence of immunity to each of the three diseases. A routine second dose of MMR vaccine should be obtained at least 28 days after the first dose for students attending postsecondary schools, health care workers, or international travelers. People who received inactivated measles vaccine or an unknown type of measles vaccine during 1963-1967 should receive 2 doses of MMR vaccine. People who received inactivated mumps vaccine or an unknown type of mumps vaccine before 1979 and are at high risk for mumps infection should consider immunization with 2 doses of MMR vaccine. For females of childbearing age, rubella immunity should be determined. If there is no evidence of immunity, females who are not pregnant should be vaccinated. If there is no evidence of immunity, females who are pregnant should delay immunization until after pregnancy. Unvaccinated health care workers born before [birth year] who lack laboratory evidence of measles, mumps, or rubella immunity or laboratory confirmation of disease should consider measles and mumps immunization with 2 doses of MMR vaccine or rubella immunization with 1 dose of MMR vaccine.  Pneumococcal 13-valent conjugate (PCV13) vaccine. When indicated, a person who is uncertain of her immunization history and has no record of immunization should receive the PCV13 vaccine. An adult aged 62 years or older who has certain medical conditions and has not  been previously immunized should receive 1 dose of PCV13 vaccine. This PCV13 should be followed with a dose of pneumococcal polysaccharide (PPSV23) vaccine. The PPSV23 vaccine dose should be obtained at least 8 weeks after the dose of PCV13 vaccine. An adult aged 45 years or older who has certain medical conditions and previously received 1 or more doses of PPSV23 vaccine should receive 1 dose of PCV13. The PCV13 vaccine dose should be obtained 1 or more years after the last PPSV23 vaccine dose.  Pneumococcal polysaccharide (PPSV23) vaccine. When PCV13 is also indicated, PCV13 should be obtained first. All adults aged 3 years and older should be immunized. An adult younger than age 22 years who has certain medical conditions should be immunized. Any person who resides in a nursing home or long-term care facility should be immunized. An adult smoker should be immunized. People with an immunocompromised condition and certain other conditions should receive both PCV13 and PPSV23 vaccines. People with human immunodeficiency virus (HIV) infection should be immunized as soon as possible after diagnosis. Immunization during chemotherapy or radiation therapy should be avoided. Routine use of PPSV23 vaccine is not recommended for American Indians, [place]atives, or people younger than 65 years unless there are medical conditions that require PPSV23 vaccine. When indicated, people who have unknown immunization and have no record of immunization should receive PPSV23 vaccine. One-time revaccination 5 years after the first dose of PPSV23 is recommended for people aged 19-64 years who have chronic kidney failure, nephrotic syndrome, asplenia, or immunocompromised conditions. People who received 1-2 doses of PPSV23 before age 99 years should receive another dose of PPSV23 vaccine at age 61 years or later if at least 5 years have passed since the previous dose. Doses of PPSV23 are not needed for people immunized with PPSV23 at  or after age 51 years.  Meningococcal vaccine. Adults with asplenia or persistent complement component deficiencies should receive 2 doses of quadrivalent meningococcal conjugate (MenACWY-D) vaccine. The doses should  be obtained at least 2 months apart. Microbiologists working with certain meningococcal bacteria, [place] recruits, people at risk during an outbreak, and people who travel to or live in countries with a high rate of meningitis should be immunized. A first-year college student up through age 67 years who is living in a residence hall should receive a dose if she did not receive a dose on or after her 16th birthday. Adults who have certain high-risk conditions should receive one or more doses of vaccine.  Hepatitis A vaccine. Adults who wish to be protected from this disease, have certain high-risk conditions, work with hepatitis A-infected animals, work in hepatitis A research labs, or travel to or work in countries with a high rate of hepatitis A should be immunized. Adults who were previously unvaccinated and who anticipate close contact with an international adoptee during the first 60 days after arrival in the [place] from a country with a high rate of hepatitis A should be immunized.  Hepatitis B vaccine. Adults who wish to be protected from this disease, have certain high-risk conditions, may be exposed to blood or other infectious body fluids, are household contacts or sex partners of hepatitis B positive people, are clients or workers in certain care facilities, or travel to or work in countries with a high rate of hepatitis B should be immunized.  Haemophilus influenzae type b (Hib) vaccine. A previously unvaccinated person with asplenia or sickle cell disease or having a scheduled splenectomy should receive 1 dose of Hib vaccine. Regardless of previous immunization, a recipient of a hematopoietic stem cell transplant should receive a 3-dose series 6-12 months after her  successful transplant. Hib vaccine is not recommended for adults with HIV infection. Preventive Services / Frequency Ages 72 to 65 years  Blood pressure check.** / Every 1 to 2 years.  Lipid and cholesterol check.** / Every 5 years beginning at age 25.  Clinical breast exam.** / Every 3 years for women in their 83s and 70s.  BRCA-related cancer risk assessment.** / For women who have family members with a BRCA-related cancer (breast, ovarian, tubal, or peritoneal cancers).  Pap test.** / Every 2 years from ages 28 through 49. Every 3 years starting at age 3 through age 49 or 13 with a history of 3 consecutive normal Pap tests.  HPV screening.** / Every 3 years from ages 65 through ages 24 to 10 with a history of 3 consecutive normal Pap tests.  Hepatitis C blood test.** / For any individual with known risks for hepatitis C.  Skin self-exam. / Monthly.  Influenza vaccine. / Every year.  Tetanus, diphtheria, and acellular pertussis (Tdap, Td) vaccine.** / Consult your health care provider. Pregnant women should receive 1 dose of Tdap vaccine during each pregnancy. 1 dose of Td every 10 years.  Varicella vaccine.** / Consult your health care provider. Pregnant females who do not have evidence of immunity should receive the first dose after pregnancy.  HPV vaccine. / 3 doses over 6 months, if 74 and younger. The vaccine is not recommended for use in pregnant females. However, pregnancy testing is not needed before receiving a dose.  Measles, mumps, rubella (MMR) vaccine.** / You need at least 1 dose of MMR if you were born in 1957 or later. You may also need a 2nd dose. For females of childbearing age, rubella immunity should be determined. If there is no evidence of immunity, females who are not pregnant should be vaccinated. If there is no evidence of  immunity, females who are pregnant should delay immunization until after pregnancy.  Pneumococcal 13-valent conjugate (PCV13) vaccine.** /  Consult your health care provider.  Pneumococcal polysaccharide (PPSV23) vaccine.** / 1 to 2 doses if you smoke cigarettes or if you have certain conditions.  Meningococcal vaccine.** / 1 dose if you are age 36 to 9 years and a Market researcher living in a residence hall, or have one of several medical conditions, you need to get vaccinated against meningococcal disease. You may also need additional booster doses.  Hepatitis A vaccine.** / Consult your health care provider.  Hepatitis B vaccine.** / Consult your health care provider.  Haemophilus influenzae type b (Hib) vaccine.** / Consult your health care provider. Ages 50 to 55 years  Blood pressure check.** / Every 1 to 2 years.  Lipid and cholesterol check.** / Every 5 years beginning at age 59 years.  Lung cancer screening. / Every year if you are aged 83-80 years and have a 30-pack-year history of smoking and currently smoke or have quit within the past 15 years. Yearly screening is stopped once you have quit smoking for at least 15 years or develop a health problem that would prevent you from having lung cancer treatment.  Clinical breast exam.** / Every year after age 63 years.  BRCA-related cancer risk assessment.** / For women who have family members with a BRCA-related cancer (breast, ovarian, tubal, or peritoneal cancers).  Mammogram.** / Every year beginning at age 77 years and continuing for as long as you are in good health. Consult with your health care provider.  Pap test.** / Every 3 years starting at age 52 years through age 51 or 69 years with a history of 3 consecutive normal Pap tests.  HPV screening.** / Every 3 years from ages 28 years through ages 17 to 7 years with a history of 3 consecutive normal Pap tests.  Fecal occult blood test (FOBT) of stool. / Every year beginning at age 5 years and continuing until age 22 years. You may not need to do this test if you get a colonoscopy every 10  years.  Flexible sigmoidoscopy or colonoscopy.** / Every 5 years for a flexible sigmoidoscopy or every 10 years for a colonoscopy beginning at age 92 years and continuing until age 76 years.  Hepatitis C blood test.** / For all people born from [birth year] through 1965 and any individual with known risks for hepatitis C.  Skin self-exam. / Monthly.  Influenza vaccine. / Every year.  Tetanus, diphtheria, and acellular pertussis (Tdap/Td) vaccine.** / Consult your health care provider. Pregnant women should receive 1 dose of Tdap vaccine during each pregnancy. 1 dose of Td every 10 years.  Varicella vaccine.** / Consult your health care provider. Pregnant females who do not have evidence of immunity should receive the first dose after pregnancy.  Zoster vaccine.** / 1 dose for adults aged 64 years or older.  Measles, mumps, rubella (MMR) vaccine.** / You need at least 1 dose of MMR if you were born in 1957 or later. You may also need a 2nd dose. For females of childbearing age, rubella immunity should be determined. If there is no evidence of immunity, females who are not pregnant should be vaccinated. If there is no evidence of immunity, females who are pregnant should delay immunization until after pregnancy.  Pneumococcal 13-valent conjugate (PCV13) vaccine.** / Consult your health care provider.  Pneumococcal polysaccharide (PPSV23) vaccine.** / 1 to 2 doses if you smoke cigarettes or if you  have certain conditions.  Meningococcal vaccine.** / Consult your health care provider.  Hepatitis A vaccine.** / Consult your health care provider.  Hepatitis B vaccine.** / Consult your health care provider.  Haemophilus influenzae type b (Hib) vaccine.** / Consult your health care provider. Ages 50 years and over  Blood pressure check.** / Every 1 to 2 years.  Lipid and cholesterol check.** / Every 5 years beginning at age 33 years.  Lung cancer screening. / Every year if you are aged 29-80 years  and have a 30-pack-year history of smoking and currently smoke or have quit within the past 15 years. Yearly screening is stopped once you have quit smoking for at least 15 years or develop a health problem that would prevent you from having lung cancer treatment.  Clinical breast exam.** / Every year after age 73 years.  BRCA-related cancer risk assessment.** / For women who have family members with a BRCA-related cancer (breast, ovarian, tubal, or peritoneal cancers).  Mammogram.** / Every year beginning at age 40 years and continuing for as long as you are in good health. Consult with your health care provider.  Pap test.** / Every 3 years starting at age 32 years through age 88 or 47 years with 3 consecutive normal Pap tests. Testing can be stopped between 65 and 70 years with 3 consecutive normal Pap tests and no abnormal Pap or HPV tests in the past 10 years.  HPV screening.** / Every 3 years from ages 81 years through ages 23 or 74 years with a history of 3 consecutive normal Pap tests. Testing can be stopped between 65 and 70 years with 3 consecutive normal Pap tests and no abnormal Pap or HPV tests in the past 10 years.  Fecal occult blood test (FOBT) of stool. / Every year beginning at age 47 years and continuing until age 67 years. You may not need to do this test if you get a colonoscopy every 10 years.  Flexible sigmoidoscopy or colonoscopy.** / Every 5 years for a flexible sigmoidoscopy or every 10 years for a colonoscopy beginning at age 12 years and continuing until age 16 years.  Hepatitis C blood test.** / For all people born from [birth year] through 1965 and any individual with known risks for hepatitis C.  Osteoporosis screening.** / A one-time screening for women ages 54 years and over and women at risk for fractures or osteoporosis.  Skin self-exam. / Monthly.  Influenza vaccine. / Every year.  Tetanus, diphtheria, and acellular pertussis (Tdap/Td) vaccine.** / 1 dose of Td  every 10 years.  Varicella vaccine.** / Consult your health care provider.  Zoster vaccine.** / 1 dose for adults aged 39 years or older.  Pneumococcal 13-valent conjugate (PCV13) vaccine.** / Consult your health care provider.  Pneumococcal polysaccharide (PPSV23) vaccine.** / 1 dose for all adults aged 2 years and older.  Meningococcal vaccine.** / Consult your health care provider.  Hepatitis A vaccine.** / Consult your health care provider.  Hepatitis B vaccine.** / Consult your health care provider.  Haemophilus influenzae type b (Hib) vaccine.** / Consult your health care provider. ** Family history and personal history of risk and conditions may change your health care provider's recommendations. Document Released: 09/02/2001 Document Revised: 11/21/2013 Document Reviewed: 12/02/2010 [facility] Patient Information 2015 [place], [place]. This information is not intended to replace advice given to you by your health care provider. Make sure you discuss any questions you have with your health care provider.

## 2014-11-07 NOTE — Telephone Encounter (Signed)
Caller name:Kimbery Haigh Relationship to patient:self Can be reached:[phone] Pharmacy:[facility] in [place] on [address]  Reason for call: RX called in for simvastatin (ZOCOR) 20 MG tablet - pharmacy called PT and stated that her current med cholestyranine ( powder) - takes intermittently and may interact with the RX for simvastatin

## 2014-11-07 NOTE — Telephone Encounter (Signed)
Called patient with lab results. Advised Rx for Simvastatin was sent to pharmacy. Advised to return in 3 months for repeat labs and follow up. Advised pregnancy should be avoided wile on Simvastatin. Patient states  LMP was 5 years ago.

## 2014-11-08 ENCOUNTER — Telehealth: Payer: Self-pay | Admitting: Medical

## 2014-11-08 NOTE — Telephone Encounter (Signed)
Pt states she rarely has to use cholestyramine. Maybe one time every 2 wks. So I advised pt to use simvastatin. But in event has to use choestyramine then hold dosing of simvistatin for 48 hours.

## 2014-11-09 ENCOUNTER — Encounter: Payer: Self-pay | Admitting: Obstetrics & Gynecology

## 2014-11-09 ENCOUNTER — Ambulatory Visit (INDEPENDENT_AMBULATORY_CARE_PROVIDER_SITE_OTHER): Payer: BLUE CROSS/BLUE SHIELD | Admitting: Obstetrics & Gynecology

## 2014-11-09 VITALS — BP 110/78 | HR 60 | Resp 16 | Ht 64.5 in | Wt 177.4 lb

## 2014-11-09 DIAGNOSIS — Z01419 Encounter for gynecological examination (general) (routine) without abnormal findings: Secondary | ICD-10-CM | POA: Diagnosis not present

## 2014-11-09 DIAGNOSIS — Z9889 Other specified postprocedural states: Secondary | ICD-10-CM

## 2014-11-09 DIAGNOSIS — Z124 Encounter for screening for malignant neoplasm of cervix: Secondary | ICD-10-CM

## 2014-11-09 DIAGNOSIS — D271 Benign neoplasm of left ovary: Secondary | ICD-10-CM | POA: Diagnosis not present

## 2014-11-09 NOTE — Progress Notes (Addendum)
44 y.o. [id number] MarriedCaucasianF here for annual exam/new patient visit.  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  After delivery had surgery and staging.  Left ovary and tube was removed.  Has done well since.  Having yearly PUS and ca-125's.  Pt reports she's never had any other blood work for follow-up.  D/W pt typical blood work that is followed is not a ca-125 but having been so long, seems like PUS yearly is reasonable and stopping ca-125 would be as well.  Pt comfortable with this.  Did have an endometrial ablation about five years ago for bleeding control.  It is not "working" now like it used to and she has some spotting, irregular bleeding now.  This is light but manageable.    No LMP recorded. Patient has had an ablation.          Sexually active: Yes.    The current method of family planning is none.    Exercising: Yes.    walking, weights, and cardio Smoker:  no  Health Maintenance: Pap:  7/15 WNL History of abnormal Pap:  yes MMG:  7/15-scheduled in 8/16 Colonoscopy:  2013-repeat in 5 years BMD:   none TDaP:  11/06/14 Screening Labs: PCP, Hb today: PCP, Urine today: PCP   reports that she has never smoked. She has never used smokeless tobacco. She reports that she drinks about 1.2 oz of alcohol per week. She reports that she does not use illicit drugs.  Past Medical History  Diagnosis Date  . Allergy   . Asthma     HX  . Cancer [phone]    ovarian  . GERD (gastroesophageal reflux disease)   . Breast cancer 2007  . Abnormal Pap smear of cervix     h/o colposcopy/biopsy  . Hyperlipemia     on medication     Past Surgical History  Procedure Laterality Date  . Appendectomy    . Cholecystectomy    . Breast lumpectomy      left breast  . Laparoscopic oopherectomy      right ovary and tube  . Endometrial ablation      5 yrs ago. No menses since.    Family History  Problem Relation Age of Onset  . Diabetes Mother   . Colon  cancer Father   . Breast cancer Maternal Aunt   . Ovarian cancer Maternal Aunt   . Breast cancer Maternal Grandmother   . Ovarian cancer Cousin   . Colon cancer Cousin   . Melanoma Cousin   . Cancer Other     breast and ovarian-maternal side    ROS:  Pertinent items are noted in HPI.  Otherwise, a comprehensive ROS was negative.  Exam:      Ht Readings from Last 3 Encounters:  11/06/14 5\' 4"  (1.626 m)  09/18/14 5\' 4"  (1.626 m)  08/10/14 5' 4.5" (1.638 m)    General appearance: alert, cooperative and appears stated age Head: Normocephalic, without obvious abnormality, atraumatic Neck: no adenopathy, supple, symmetrical, trachea midline and thyroid normal to inspection and palpation Lungs: clear to auscultation bilaterally Breasts: normal appearance, no masses or tenderness Heart: regular rate and rhythm Abdomen: soft, non-tender; bowel sounds normal; no masses,  no organomegaly Extremities: extremities normal, atraumatic, no cyanosis or edema Skin: Skin color, texture, turgor normal. No rashes or lesions Lymph nodes: Cervical, supraclavicular, and axillary nodes normal. No abnormal inguinal nodes palpated Neurologic: Grossly normal   Pelvic: External genitalia:  no lesions  Urethra:  normal appearing urethra with no masses, tenderness or lesions              Bartholins and Skenes: normal                 Vagina: normal appearing vagina with normal color and discharge, no lesions              Cervix: no lesions              Pap taken: Yes.   Bimanual Exam:  Uterus:  normal size, contour, position, consistency, mobility, non-tender              Adnexa: normal adnexa               Rectovaginal: Confirms               Anus:  normal sphincter tone, no lesions  Chaperone was present for exam.  A/P:  Well Woman with normal exam -Pap only obtained today.  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  Pt desires genetic testing.   MRI yearly vs every other yr for follow-up.  Will need to discuss with expert in field. PUS and endometrial biopsy planned.  Pt in agreement with this. return annually or prn  Note from Karen Chafe, RN:   Dr. [name] Heck, referral was placed and patient has not returned phone calls from Genetics to schedule.   I did note that there is a message on appointment notes from Dr. Marin Olp that lab at cancer center is to draw "[facility]" when she goes for next appointment there on 02/19/15.

## 2014-11-14 ENCOUNTER — Telehealth: Payer: Self-pay | Admitting: Genetic Counselor

## 2014-11-14 NOTE — Telephone Encounter (Signed)
Called pt and left message to schedule new pt gen counseling appt.  Dx: Sertoli-leydig cell tumor of ovary, left Referring: [place]

## 2014-11-15 ENCOUNTER — Telehealth: Payer: Self-pay | Admitting: Obstetrics & Gynecology

## 2014-11-15 NOTE — Telephone Encounter (Signed)
Opened in error

## 2014-11-16 ENCOUNTER — Telehealth: Payer: Self-pay | Admitting: Genetic Counselor

## 2014-11-16 LAB — IPS PAP TEST WITH REFLEX TO HPV

## 2014-11-16 NOTE — Telephone Encounter (Signed)
Left second message for patient regarding gen counseling.

## 2014-11-23 ENCOUNTER — Ambulatory Visit (INDEPENDENT_AMBULATORY_CARE_PROVIDER_SITE_OTHER): Payer: BLUE CROSS/BLUE SHIELD

## 2014-11-23 ENCOUNTER — Encounter: Payer: Self-pay | Admitting: Obstetrics & Gynecology

## 2014-11-23 ENCOUNTER — Ambulatory Visit (INDEPENDENT_AMBULATORY_CARE_PROVIDER_SITE_OTHER): Payer: BLUE CROSS/BLUE SHIELD | Admitting: Obstetrics & Gynecology

## 2014-11-23 VITALS — BP 128/84 | Resp 20 | Ht 64.5 in | Wt 181.0 lb

## 2014-11-23 DIAGNOSIS — Z9889 Other specified postprocedural states: Secondary | ICD-10-CM

## 2014-11-23 DIAGNOSIS — N8320 Unspecified ovarian cysts: Secondary | ICD-10-CM

## 2014-11-23 DIAGNOSIS — D271 Benign neoplasm of left ovary: Secondary | ICD-10-CM | POA: Diagnosis not present

## 2014-11-23 DIAGNOSIS — N938 Other specified abnormal uterine and vaginal bleeding: Secondary | ICD-10-CM | POA: Diagnosis not present

## 2014-11-23 DIAGNOSIS — N83201 Unspecified ovarian cyst, right side: Secondary | ICD-10-CM

## 2014-11-23 NOTE — Progress Notes (Signed)
44 y.o.  G1P1 MWF here for PUS after being seen after new patient exam on 11/08/13.  Pt with hx of Sertoli-Leydig tumor of left ovary diagnosed as 23.  S/P x-lap and LSO.  Had yearly PUS and ca-125's (?) since then.  Pt never had other tumor markers.  Reviewed with her lack of use of Ca-125 with this hx so will not continue this.  Pt also with hx of endometrial ablation and now with DUB.  Aware I am going to attempt and endometrial biopsy today but this may not be very useful, depending on scarring of endometrial cavity.  Have planned to du uner ultrasound guidance.      Patient's last menstrual period was 11/04/2014.  Sexually active:  yes  Contraception: no method  FINDINGS: UTERUS:  7 x 4.2 x 3.8cm EMS: 7.74mm, difficult visualization due to endometrial ablation hx ADNEXA:   Left ovary surgically absent   Right ovary 3.3 x 1.6 x 2.2cm with 2.0cm probable hemorrhagiccorpus luteal cyst CUL DE SAC: no free fluid  Endometrial biopsy recommended.  Discussed with patient.  Verbal and written consent obtained.   Procedure:  Speculum placed.  Cervix visualized and cleansed with betadine prep.  A single toothed tenaculum was applied to the anterior lip of the cervix.  Endometrial pipelle was advanced through the cervix into the endometrial cavity without difficulty.  Pipelle passed to 4.5cm.  Suction applied and pipelle removed with scant tissue sample obtained.  This was passed a second time.  I wil clearly not fully within the uterine cavity but also clearly had not performated the uterus.  Pt with significant discomfort so procedure ended.  Tenculum removed.  No bleeding noted.  Patient tolerated procedure well.  Reviewed findings with pt.  Due to complex but most likely benign finding on ovary will plan to repeat PUS in six-eight weeks to ensure cyst fully resolved.  Pt in agreement with plan.  Assessment:  DUB in pt with hx of endometrial ablation H/O Sertoli-leydig tumor on left ovary s/p  LSO Irregular endometrium on PUS today  Plan: Probably inadequate endometrial biopsy obtained today.  Could consider micronor for pt or additional OCP options.  Pt not really interested in this.  Would rather just watch.  Will consider.  Pt will be called with results of biopsy.  Repeat PUS 6-8 weeks.

## 2014-11-23 NOTE — Progress Notes (Signed)
Patient scheduled for follow up Pelvic ultrasound with Dr. [name] Heck in 6 weeks for 01/04/15. Patient verbalized understanding of the [facility] appointment cancellation policy. Advised will need to cancel or reschedule within 72 business hours of appointment (3 business days) or will have $100.00 late cancellation fee placed to account.

## 2014-11-24 ENCOUNTER — Telehealth: Payer: Self-pay | Admitting: Genetic Counselor

## 2014-11-24 NOTE — Telephone Encounter (Signed)
Left third message for pt regarding gen counseling appt.

## 2014-11-26 ENCOUNTER — Encounter: Payer: Self-pay | Admitting: Obstetrics & Gynecology

## 2014-11-30 ENCOUNTER — Encounter: Payer: BLUE CROSS/BLUE SHIELD | Admitting: Internal Medicine

## 2014-12-05 ENCOUNTER — Encounter: Payer: Self-pay | Admitting: *Deleted

## 2014-12-06 ENCOUNTER — Telehealth: Payer: Self-pay | Admitting: Emergency Medicine

## 2014-12-06 NOTE — Telephone Encounter (Addendum)
Called patient. She is advised of message from Dr. [name] Heck and verbalized understanding. She states will call back with any heavy or irregular bleeding. Routing to provider for final review. Patient agreeable to disposition. Will close encounter.

## 2014-12-06 NOTE — Telephone Encounter (Signed)
-----   Message from Megan Salon, MD sent at 12/05/2014  1:00 PM EDT ----- Please inform pt endometrial biopsy was negative for abnormal cells but there was not endometrial cells in the biopsy.  She's had a hx of an endometrial ablation and she knew when I did the biopsy that I didn't think I got into the endometrial canal due to scarring from her prior procedure.  She does not want to do anything else at this point but should let me know if her bleeding becomes any heavier or more irregular.  Thanks.

## 2015-01-04 ENCOUNTER — Encounter: Payer: Self-pay | Admitting: Obstetrics & Gynecology

## 2015-01-04 ENCOUNTER — Ambulatory Visit (INDEPENDENT_AMBULATORY_CARE_PROVIDER_SITE_OTHER): Payer: BLUE CROSS/BLUE SHIELD | Admitting: Obstetrics & Gynecology

## 2015-01-04 ENCOUNTER — Ambulatory Visit (INDEPENDENT_AMBULATORY_CARE_PROVIDER_SITE_OTHER): Payer: BLUE CROSS/BLUE SHIELD

## 2015-01-04 VITALS — BP 120/86 | HR 80 | Resp 14 | Wt 176.0 lb

## 2015-01-04 DIAGNOSIS — N8320 Unspecified ovarian cysts: Secondary | ICD-10-CM | POA: Diagnosis not present

## 2015-01-04 DIAGNOSIS — Z9889 Other specified postprocedural states: Secondary | ICD-10-CM

## 2015-01-04 DIAGNOSIS — D271 Benign neoplasm of left ovary: Secondary | ICD-10-CM

## 2015-01-04 DIAGNOSIS — N83201 Unspecified ovarian cyst, right side: Secondary | ICD-10-CM

## 2015-01-04 NOTE — Progress Notes (Signed)
44 y.o. Carolyn Wilson here for a pelvic ultrasound to re-evaluate prior 2.0cm probable hemorrhagic corpus luteal cyst.  Pt denies any pain.  Continues to have a little irregular spotting.  H/O endometrial ablation and attempted biopsy at last visit that was unsuccessful due to findings of endometrial scarring.  Biopsy was non-diagnostic which wasn't surprising given the scarring present.  Not on any birth control.  Discussed this at last visit.  Pt feels is unnecessary due to prior hx.  Aware she may have risk for pregnancy.  She is ok with this.    No LMP recorded. Patient has had an ablation.  Sexually active:  yes  Contraception: no method  FINDINGS: UTERUS: 6.7 x 5 x 3 cm without fibroids EMS: 3.88mm ADNEXA:   Left ovary surgically absent   Right ovary 2.7 x 1.0 x 2.0cm without any cyst present today.  Small 72mm right follicle present CUL DE SAC: no free fluid  Findings discussed with pt.  For now, no additional follow up needed.  Pt advised to call if has changes in bleeding.  Knows only option at this point would be hysterectomy and just doesn't feel problem is bad enough to consider at this point.    Assessment:  Resolution of right hemorrhagic corpus luteal cyst H/O DUB in pt with prior endometrial ablation, non-diagnostic endometrial biopsy obtained 11/23/14 H/o Sertoli-leydig tumor on left ovary s/p LSO No contraception, pt declines  Plan: Pt aware to call with any changes in bleeding Aware genetic counseling has also been trying to reach her.  States she will call back Has MMG scheduled Has follow up with Dr. Marin Olp scheduled  ~15 minutes spent with patient >50% of time was in face to face discussion of above.

## 2015-02-16 ENCOUNTER — Ambulatory Visit: Payer: BLUE CROSS/BLUE SHIELD

## 2015-02-19 ENCOUNTER — Other Ambulatory Visit (HOSPITAL_BASED_OUTPATIENT_CLINIC_OR_DEPARTMENT_OTHER): Payer: BLUE CROSS/BLUE SHIELD

## 2015-02-19 ENCOUNTER — Ambulatory Visit (HOSPITAL_BASED_OUTPATIENT_CLINIC_OR_DEPARTMENT_OTHER): Payer: BLUE CROSS/BLUE SHIELD | Admitting: Family

## 2015-02-19 VITALS — BP 132/89 | HR 92 | Temp 98.1°F | Resp 18 | Ht 64.5 in | Wt 178.0 lb

## 2015-02-19 DIAGNOSIS — Z8543 Personal history of malignant neoplasm of ovary: Secondary | ICD-10-CM

## 2015-02-19 DIAGNOSIS — C50912 Malignant neoplasm of unspecified site of left female breast: Secondary | ICD-10-CM

## 2015-02-19 DIAGNOSIS — C561 Malignant neoplasm of right ovary: Secondary | ICD-10-CM

## 2015-02-19 DIAGNOSIS — Z803 Family history of malignant neoplasm of breast: Secondary | ICD-10-CM | POA: Diagnosis not present

## 2015-02-19 DIAGNOSIS — D271 Benign neoplasm of left ovary: Secondary | ICD-10-CM

## 2015-02-19 DIAGNOSIS — Z8041 Family history of malignant neoplasm of ovary: Secondary | ICD-10-CM

## 2015-02-19 LAB — CBC WITH DIFFERENTIAL (CANCER CENTER ONLY)
BASO#: 0.1 10*3/uL (ref 0.0–0.2)
BASO%: 0.6 % (ref 0.0–2.0)
EOS ABS: 0.2 10*3/uL (ref 0.0–0.5)
EOS%: 2.3 % (ref 0.0–7.0)
HCT: 41.2 % (ref 34.8–46.6)
HGB: 14.3 g/dL (ref 11.6–15.9)
LYMPH#: 2.8 10*3/uL (ref 0.9–3.3)
LYMPH%: 27.2 % (ref 14.0–48.0)
MCH: 30.8 pg (ref 26.0–34.0)
MCHC: 34.7 g/dL (ref 32.0–36.0)
MCV: 89 fL (ref 81–101)
MONO#: 0.5 10*3/uL (ref 0.1–0.9)
MONO%: 5.2 % (ref 0.0–13.0)
NEUT%: 64.7 % (ref 39.6–80.0)
NEUTROS ABS: 6.6 10*3/uL — AB (ref 1.5–6.5)
PLATELETS: 303 10*3/uL (ref 145–400)
RBC: 4.64 10*6/uL (ref 3.70–5.32)
RDW: 12.8 % (ref 11.1–15.7)
WBC: 10.2 10*3/uL — ABNORMAL HIGH (ref 3.9–10.0)

## 2015-02-19 NOTE — Progress Notes (Signed)
Hematology and Oncology Follow Up Visit  Carolyn Wilson [id number] 31-May-1971 44 y.o. 02/19/2015   Principle Diagnosis:  History of Sertoli-Leydig tumor of the left ovary  Strong family history of breast and ovarian cancer - BRCA 1/2 and p53 negative  Current Therapy:   Observation    Interim History:  Carolyn Wilson is here today for a follow-up. She is doing well and has no complaints at this time. She is being followed by Dr. [name] Heck with gynecology now. She had a follow-up US for what was thought to be a hemorrhagic cyst. This has now resolved.  She has had no changes with her chest. No mass, lesion, rash or lymphadenopathy found on exam. He mammogram is scheduled for August 15th.  She has had no SOB which she attributes to allergies. This is with exertion and when she is outside.  She has had no problem with infections. No fever, chills, n/v, cough, rash, dizziness, SOB, chest pain, palpitations, abdominal pain, changes in bowel or bladder habits. No blood in her urine or stool.  No swelling, tenderness, numbness or tingling in her extremities. No c/o pain at this time.  She has a good appetite and is eating healthy. She is staying hydrated. Her weight is stable.     Medications:    Medication List       This list is accurate as of: 02/19/15 10:00 AM.  Always use your most recent med list.               cholestyramine 4 GM/DOSE powder  Commonly known as:  QUESTRAN  as needed.     dicyclomine 10 MG capsule  Commonly known as:  BENTYL  as needed. Take 10 mg by mouth 4 times daily (before meals and nightly),     EPINEPHrine 0.3 mg/0.3 mL Soaj injection  Commonly known as:  EPI-PEN  Inject once into muscle for any allergic reaction to peanut exposure     esomeprazole 40 MG capsule  Commonly known as:  NEXIUM  Take 40 mg by mouth daily at 12 noon.     ondansetron 4 MG tablet  Commonly known as:  ZOFRAN  Take 4 mg by mouth every 8 (eight) hours as needed for nausea or  vomiting.     Vitamin D 2000 UNITS Caps  Take 2,000 Units by mouth daily.        Allergies:  Allergies  Allergen Reactions  . Latex Rash    Causes blisters  . Peanut-Containing Drug Products Anaphylaxis  . Adhesive [Tape]   . Reglan [Metoclopramide] Tinitus  . Ciprofloxacin Rash  . Sulfur Rash    Past Medical History, Surgical history, Social history, and Family History were reviewed and updated.  Review of Systems: All other 10 point review of systems is negative.   Physical Exam:  height is 5' 4.5" (1.638 m) and weight is 178 lb (80.74 kg). Her oral temperature is 98.1 F (36.7 C). Her blood pressure is 132/89 and her pulse is 92. Her respiration is 18.   Wt Readings from Last 3 Encounters:  02/19/15 178 lb (80.74 kg)  01/04/15 176 lb (79.833 kg)  11/23/14 181 lb (82.101 kg)    Ocular: Sclerae unicteric, pupils equal, round and reactive to light Ear-nose-throat: Oropharynx clear, dentition fair Lymphatic: No cervical or supraclavicular adenopathy Lungs no rales or rhonchi, good excursion bilaterally Heart regular rate and rhythm, no murmur appreciated Abd soft, nontender, positive bowel sounds MSK no focal spinal tenderness, no joint edema Neuro:  non-focal, well-oriented, appropriate affect Breasts: No changes. No mass, lesion, rash or lymphadenopathy found on assessment.   Lab Results  Component Value Date   WBC 10.2* 02/19/2015   HGB 14.3 02/19/2015   HCT 41.2 02/19/2015   MCV 89 02/19/2015   PLT 303 02/19/2015   No results found for: FERRITIN, IRON, TIBC, UIBC, IRONPCTSAT Lab Results  Component Value Date   RBC 4.64 02/19/2015   No results found for: KPAFRELGTCHN, LAMBDASER, KAPLAMBRATIO No results found for: IGGSERUM, IGA, IGMSERUM No results found for: Odetta Pink, SPEI   Chemistry      Component Value Date/Time   NA 139 11/06/2014 0927   K 4.2 11/06/2014 0927   CL 101 11/06/2014 0927    CO2 28 11/06/2014 0927   BUN 10 11/06/2014 0927   CREATININE 0.82 11/06/2014 0927      Component Value Date/Time   CALCIUM 10.1 11/06/2014 0927   ALKPHOS 84 11/06/2014 0927   AST 27 11/06/2014 0927   ALT 37* 11/06/2014 0927   BILITOT 0.5 11/06/2014 0927     Impression and Plan: Carolyn Wilson is a 44 yo premenopausal female with a 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  She has a strong family history of breast and ovarian cancer.  She is scheduled to have a mammogram on August 15th. We will alternate mammograms and MRIs every 6 months. I place the order for the MRI in February 2017.  I will speak with her about genetic analysis with [facility]. If she is interested we will check this for her.  We will plan to see her back in 1 year for labs and follow-up.  She knows to contact us with any questions or concerns. We can certainly see her sooner if need be.   Eliezer Bottom, NP 8/1/201610:00 AM

## 2015-02-20 ENCOUNTER — Encounter: Payer: Self-pay | Admitting: *Deleted

## 2015-02-20 LAB — COMPREHENSIVE METABOLIC PANEL
ALK PHOS: 71 U/L (ref 33–115)
ALT: 26 U/L (ref 6–29)
AST: 19 U/L (ref 10–30)
Albumin: 4 g/dL (ref 3.6–5.1)
BILIRUBIN TOTAL: 0.4 mg/dL (ref 0.2–1.2)
BUN: 9 mg/dL (ref 7–25)
CHLORIDE: 103 mmol/L (ref 98–110)
CO2: 26 mmol/L (ref 20–31)
CREATININE: 0.73 mg/dL (ref 0.50–1.10)
Calcium: 9.4 mg/dL (ref 8.6–10.2)
GLUCOSE: 165 mg/dL — AB (ref 65–99)
Potassium: 3.9 mmol/L (ref 3.5–5.3)
Sodium: 140 mmol/L (ref 135–146)
Total Protein: 7.1 g/dL (ref 6.1–8.1)

## 2015-02-20 LAB — VITAMIN D 25 HYDROXY (VIT D DEFICIENCY, FRACTURES): Vit D, 25-Hydroxy: 36 ng/mL (ref 30–100)

## 2015-02-23 ENCOUNTER — Other Ambulatory Visit: Payer: Self-pay | Admitting: Family

## 2015-02-23 ENCOUNTER — Telehealth: Payer: Self-pay | Admitting: *Deleted

## 2015-02-23 NOTE — Telephone Encounter (Signed)
Patient would like to have the [facility] test completed, but had concerns about insurance coverage.  Spoke to the [facility] rep regarding coverage. Her insurance will not pay for the test, however [facility] has a program where the out of pocket expenses for patient's will not exceed $375. They also have financial assistance for those who can't afford that cost. Explained all of this to patient who chooses to come in for test. She will come into the office on Tuesday, August 16th at 11:30am.

## 2015-03-05 ENCOUNTER — Ambulatory Visit
Admission: RE | Admit: 2015-03-05 | Discharge: 2015-03-05 | Disposition: A | Discharge status: Home / Self Care | Payer: BLUE CROSS/BLUE SHIELD | Source: Ambulatory Visit | Attending: Hematology & Oncology | Admitting: Hematology & Oncology

## 2015-03-05 DIAGNOSIS — Z1231 Encounter for screening mammogram for malignant neoplasm of breast: Secondary | ICD-10-CM

## 2015-03-12 ENCOUNTER — Other Ambulatory Visit: Payer: Self-pay | Admitting: Hematology & Oncology

## 2015-03-12 DIAGNOSIS — R928 Other abnormal and inconclusive findings on diagnostic imaging of breast: Secondary | ICD-10-CM

## 2015-03-15 ENCOUNTER — Ambulatory Visit
Admission: RE | Admit: 2015-03-15 | Discharge: 2015-03-15 | Disposition: A | Discharge status: Home / Self Care | Payer: BLUE CROSS/BLUE SHIELD | Source: Ambulatory Visit | Attending: Hematology & Oncology | Admitting: Hematology & Oncology

## 2015-03-15 ENCOUNTER — Telehealth: Payer: Self-pay | Admitting: *Deleted

## 2015-03-15 DIAGNOSIS — R928 Other abnormal and inconclusive findings on diagnostic imaging of breast: Secondary | ICD-10-CM

## 2015-03-15 NOTE — Telephone Encounter (Signed)
Result Note     Call and tell her that I am very happy that the ultrasound did not show any obvious cancer. I am very happy for her. Hope that she has a Tax inspector Day!!!! Thanks   Message left on personal voice mail

## 2015-04-11 ENCOUNTER — Encounter: Payer: Self-pay | Admitting: Hematology & Oncology

## 2015-04-18 ENCOUNTER — Other Ambulatory Visit: Payer: Self-pay

## 2015-07-22 DIAGNOSIS — E119 Type 2 diabetes mellitus without complications: Secondary | ICD-10-CM

## 2015-07-22 HISTORY — DX: Type 2 diabetes mellitus without complications: E11.9

## 2015-07-23 ENCOUNTER — Encounter: Payer: Self-pay | Admitting: Obstetrics & Gynecology

## 2015-07-24 ENCOUNTER — Ambulatory Visit (INDEPENDENT_AMBULATORY_CARE_PROVIDER_SITE_OTHER): Payer: BLUE CROSS/BLUE SHIELD | Admitting: Obstetrics & Gynecology

## 2015-07-24 ENCOUNTER — Telehealth: Payer: Self-pay | Admitting: Emergency Medicine

## 2015-07-24 ENCOUNTER — Encounter: Payer: Self-pay | Admitting: Obstetrics & Gynecology

## 2015-07-24 VITALS — BP 100/60 | HR 80 | Resp 16 | Ht 64.5 in | Wt 180.0 lb

## 2015-07-24 DIAGNOSIS — Z9889 Other specified postprocedural states: Secondary | ICD-10-CM

## 2015-07-24 DIAGNOSIS — N9489 Other specified conditions associated with female genital organs and menstrual cycle: Secondary | ICD-10-CM | POA: Diagnosis not present

## 2015-07-24 DIAGNOSIS — N926 Irregular menstruation, unspecified: Secondary | ICD-10-CM

## 2015-07-24 DIAGNOSIS — R102 Pelvic and perineal pain: Secondary | ICD-10-CM

## 2015-07-24 LAB — POCT URINALYSIS DIPSTICK
Bilirubin, UA: NEGATIVE
Glucose, UA: NEGATIVE
KETONES UA: NEGATIVE
LEUKOCYTES UA: NEGATIVE
Nitrite, UA: NEGATIVE
PROTEIN UA: NEGATIVE
UROBILINOGEN UA: NEGATIVE
pH, UA: 7

## 2015-07-24 LAB — POCT URINE PREGNANCY: PREG TEST UR: NEGATIVE

## 2015-07-24 NOTE — Telephone Encounter (Signed)
Responded to patient via mychart and telephone call created for clinical triage.

## 2015-07-24 NOTE — Telephone Encounter (Signed)
Chief Complaint  Patient presents with  . Advice Only    Patient sent mychart message     ===View-only below this line===   ----- Message -----    From: [name]    Sent: 07/23/2015  3:26 PM EST      To: Lyman Speller, MD Subject: Non-Urgent Medical Question   I am having pain in my left side and have been bleeding some today some old looking blood and some bright red. Should I wait to see what happens or should I schedule an appointment?

## 2015-07-24 NOTE — Progress Notes (Signed)
GYNECOLOGY  VISIT   HPI: 45 y.o. G53P0101 Married Caucasian female here for complaint of vaginal bleeding that started yesterday.  She reports she had to change her pad about five times yesterday and there was real bleeding like a period.  There wasn't just spotting like in the spring and summer of last year.    This weekend she had some nausea and did have emesis.  This is better today.  Denies acne or breast tenderness.  She is having a little lower abdominal cramping but this is mild and not requiring medication.  Pt did have genetic testing last year.  This was negative except for an unknown variant.    Pt had PUS in June.  Endometrium was 3.85mm then.  Attempted biopsy was done last year as well and was un diagnostic (and was difficult) due to prior endometrial ablation.    GYNECOLOGIC HISTORY: Patient's last menstrual period was 07/22/2015. Contraception: Pt did have tubal ligation with LSO (did not have then in my notes so this was updated)        Past Medical History  Diagnosis Date  . Allergy   . Asthma     HX  . Cancer ([place]) [id number]    ovarian  . GERD (gastroesophageal reflux disease)   . Breast cancer ([place]) 2007  . Abnormal Pap smear of cervix     h/o colposcopy/biopsy  . Hyperlipemia     on medication     Past Surgical History  Procedure Laterality Date  . Appendectomy    . Cholecystectomy    . Breast lumpectomy      left breast  . Laparoscopic salpingo oopherectomy Left    . Endometrial ablation      5 yrs ago. No menses since.    Current Outpatient Prescriptions  Medication Sig Dispense Refill  . Cholecalciferol (VITAMIN D) 2000 UNITS CAPS Take 2,000 Units by mouth daily.    [name] dicyclomine (BENTYL) 10 MG capsule as needed. Take 10 mg by mouth 4 times daily (before meals and nightly),    . EPINEPHrine 0.3 mg/0.3 mL IJ SOAJ injection Inject once into muscle for any allergic reaction to peanut exposure 1 Device 1  . esomeprazole (NEXIUM) 40 MG capsule Take 40  mg by mouth daily at 12 noon.    . Multiple Vitamin (MULTI VITAMIN DAILY) TABS     . ondansetron (ZOFRAN) 4 MG tablet Take 4 mg by mouth every 8 (eight) hours as needed for nausea or vomiting.    . vitamin B-12 (CYANOCOBALAMIN) 1000 MCG tablet Take 1,000 mcg by mouth daily,     No current facility-administered medications for this visit.     ALLERGIES: Latex; Peanut-containing drug products; Sulfa antibiotics; Adhesive; Reglan; Ciprofloxacin; and Sulfur  Family History  Problem Relation Age of Onset  . Diabetes Mother   . Colon cancer Father   . Breast cancer Maternal Aunt   . Ovarian cancer Maternal Aunt   . Breast cancer Maternal Grandmother   . Ovarian cancer Cousin   . Colon cancer Cousin   . Melanoma Cousin   . Cancer Other     breast and ovarian-maternal side    SH:  Married, non smoker   ROS  PHYSICAL EXAMINATION:    BP 100/60 mmHg  Pulse 80  Resp 16  Ht 5' 4.5" (1.638 m)  Wt 180 lb (81.647 kg)  BMI 30.43 kg/m2  LMP 07/22/2015    General appearance: alert, cooperative and appears stated age Abdomen: soft, non-tender; bowel  sounds normal; no masses,  no organomegaly  Pelvic: External genitalia:  no lesions              Urethra:  normal appearing urethra with no masses, tenderness or lesions              Bartholins and Skenes: normal                 Vagina: normal appearing vagina with normal color and discharge, no lesions              Cervix: no lesions              Bimanual Exam:  Uterus:  normal size, contour, position, consistency, mobility, non-tender              Adnexa: no mass, fullness, tenderness              Rectovaginal: No..  Confirms.              Anus:  normal sphincter tone, no lesions  UPT Neg today  ASSESSMENT DUB in pt with prior endometrial ablation H/O Sertoli Leydig tumor s/p laparoscopic LSO  PLAN With normal Pap smear and normal ultrasound evaluation within the last 6-8 months, feel ok to just monitor for now.  Pt will call if has  any future bleeding or if her bleeding does not stop over the next two to three days.  Pt may be failing her ablation but with ovarian hx, she is a little anxious.  If bleeding continues or occurs again, will proceed with PUS.  Pt comfortable with plan.   An After Visit Summary was printed and given to the patient.  ______ minutes face to face time of which over 50% was spent in counseling.

## 2015-07-24 NOTE — Telephone Encounter (Signed)
Call to patient. Reviewed mychart message. Patient with history of ablation and LSO.  She states she has not had a cycle in 6 months. C/o of a "dull ache" in LLQ. Motrin helps with pain. Some nausea and dizziness a few days ago but that has resolved. Not on contraception. No bowel or bladder changes per patient.  Office visit today with Dr. [name] Heck scheduled for 915.  Routing to Damary Doland for final review. Patient agreeable to disposition. Will close encounter.

## 2015-08-06 ENCOUNTER — Telehealth: Payer: Self-pay | Admitting: Medical

## 2015-08-06 DIAGNOSIS — R739 Hyperglycemia, unspecified: Secondary | ICD-10-CM

## 2015-08-06 NOTE — Telephone Encounter (Signed)
[facility]  Patient Name: Carolyn Wilson  DOB: 08-Feb-1971    Initial Comment Caller states she is having dizziness and she tried eatig and it did not help- muscle aches and pains- goign on for about a week   Nurse Assessment  Nurse: Harlow Mares, RN, Suanne Marker Date/Time (Eastern Time): 08/06/2015 10:35:16 AM  Confirm and document reason for call. If symptomatic, describe symptoms. You must click the next button to save text entered. ---Caller states she is having dizziness and she tried eating and it did not help- muscle aches and pains- going on for about a week. Reports a feeling of the room was spinning when she closes her eyes. The only increase of meds: has been increase in her Nexium but has been taking for about 1 month.  Has the patient traveled out of the country within the last 30 days? ---No  Does the patient have any new or worsening symptoms? ---Yes  Will a triage be completed? ---Yes  Related visit to physician within the last 2 weeks? ---No  Does the PT have any chronic conditions? (i.e. diabetes, asthma, etc.) ---Yes  List chronic conditions. ---acid reflux, IBS  Did the patient indicate they were pregnant? ---No  Is this a behavioral health or substance abuse call? ---No     Guidelines    Guideline Title Affirmed Question Affirmed Notes       Final Disposition User        Comments  Caller scheduled to see Dr. Wylie Hail tomorrow 08/07/15 @ 8am at the [facility] location.

## 2015-08-06 NOTE — Telephone Encounter (Signed)
Noted. Message routed to PCP for FYI.  

## 2015-08-06 NOTE — Telephone Encounter (Signed)
Pt called in with dizziness. She ate but it's not helping. Transferred to [facility] with [facility].

## 2015-08-06 NOTE — Telephone Encounter (Signed)
Looks like I saw pt about a year ago. She needs to be seen.

## 2015-08-07 ENCOUNTER — Encounter: Payer: Self-pay | Admitting: Medical

## 2015-08-07 ENCOUNTER — Ambulatory Visit (INDEPENDENT_AMBULATORY_CARE_PROVIDER_SITE_OTHER): Payer: BLUE CROSS/BLUE SHIELD | Admitting: Medical

## 2015-08-07 VITALS — BP 100/60 | HR 79 | Temp 97.7°F | Ht 64.5 in | Wt 181.0 lb

## 2015-08-07 DIAGNOSIS — R5383 Other fatigue: Secondary | ICD-10-CM | POA: Diagnosis not present

## 2015-08-07 DIAGNOSIS — R252 Cramp and spasm: Secondary | ICD-10-CM

## 2015-08-07 DIAGNOSIS — R42 Dizziness and giddiness: Secondary | ICD-10-CM | POA: Diagnosis not present

## 2015-08-07 LAB — CBC WITH DIFFERENTIAL/PLATELET
BASOS ABS: 0 10*3/uL (ref 0.0–0.1)
Basophils Relative: 0.4 % (ref 0.0–3.0)
Eosinophils Absolute: 0.2 10*3/uL (ref 0.0–0.7)
Eosinophils Relative: 2.7 % (ref 0.0–5.0)
HEMATOCRIT: 41.8 % (ref 36.0–46.0)
HEMOGLOBIN: 14.1 g/dL (ref 12.0–15.0)
LYMPHS PCT: 27 % (ref 12.0–46.0)
Lymphs Abs: 2.3 10*3/uL (ref 0.7–4.0)
MCHC: 33.8 g/dL (ref 30.0–36.0)
MCV: 88.3 fl (ref 78.0–100.0)
MONOS PCT: 5 % (ref 3.0–12.0)
Monocytes Absolute: 0.4 10*3/uL (ref 0.1–1.0)
NEUTROS ABS: 5.6 10*3/uL (ref 1.4–7.7)
Neutrophils Relative %: 64.9 % (ref 43.0–77.0)
Platelets: 308 10*3/uL (ref 150.0–400.0)
RBC: 4.73 Mil/uL (ref 3.87–5.11)
RDW: 12.7 % (ref 11.5–15.5)
WBC: 8.6 10*3/uL (ref 4.0–10.5)

## 2015-08-07 LAB — COMPREHENSIVE METABOLIC PANEL
ALK PHOS: 89 U/L (ref 39–117)
ALT: 28 U/L (ref 0–35)
AST: 20 U/L (ref 0–37)
Albumin: 3.9 g/dL (ref 3.5–5.2)
BUN: 10 mg/dL (ref 6–23)
CO2: 30 mEq/L (ref 19–32)
Calcium: 9.1 mg/dL (ref 8.4–10.5)
Chloride: 102 mEq/L (ref 96–112)
Creatinine, Ser: 0.78 mg/dL (ref 0.40–1.20)
GFR: 85.22 mL/min (ref >60.00)
GLUCOSE: 190 mg/dL — AB (ref 70–99)
POTASSIUM: 3.8 meq/L (ref 3.5–5.1)
Sodium: 138 mEq/L (ref 135–145)
TOTAL PROTEIN: 6.9 g/dL (ref 6.0–8.3)
Total Bilirubin: 0.4 mg/dL (ref 0.2–1.2)

## 2015-08-07 LAB — MAGNESIUM: MAGNESIUM: 1.9 mg/dL (ref 1.5–2.5)

## 2015-08-07 MED ORDER — MECLIZINE HCL 12.5 MG PO TABS: 12.5000 mg | ORAL_TABLET | Freq: Three times a day (TID) | ORAL | Status: DC | PRN

## 2015-08-07 NOTE — Patient Instructions (Addendum)
Your dizziness and cramps have improved since onset. But you still report some residual symptoms.  We will get labs today to see if abnormality reveals cause.  If you have dizziness that persists could try meclizine(presciption given).  If dizziness with neurologic symptoms(as discussed) then emergency department evaluation.  Follow up in 10-14 days or as needed

## 2015-08-07 NOTE — Progress Notes (Signed)
Pre visit review using our clinic review tool, if applicable. No additional management support is needed unless otherwise documented below in the visit note. 

## 2015-08-07 NOTE — Progress Notes (Signed)
Subjective:    Patient ID: Carolyn Wilson, female    DOB: [birth year], 45 y.o.   MRN: [id number]  HPI  Pt in dizzy recently. Pt states started around about one week ago. Pt states around this time she had some muscle contractions/crampng. She could actually see and feel cramp at times. Remembers seeing small muscle in hands cramps. Her calf muscles were cramping at night.  Pt states dizziness has been on and off and light headed sensation intermittent. Now this is improved and occuring mostly at night.  Pt cramps still occuring occasional but worse on week ago.  Pt has been eating 2 banana a day since she got the cramps.  Pt not exercising. But she plans to. She bought treadmill and elliptical.  Was feeling fatigued with dizziness as well.   Review of Systems  Constitutional: Positive for fatigue. Negative for fever, chills, diaphoresis and activity change.       See hpi.  Respiratory: Negative for cough, chest tightness and shortness of breath.   Cardiovascular: Negative for chest pain, palpitations and leg swelling.  Gastrointestinal: Negative for nausea, vomiting and abdominal pain.  Musculoskeletal: Negative for neck pain and neck stiffness.       Cramps  Neurological: Positive for dizziness. Negative for facial asymmetry, speech difficulty and weakness.       See hpi.  Psychiatric/Behavioral: Negative for behavioral problems, confusion and agitation. The patient is not nervous/anxious.     Past Medical History  Diagnosis Date  . Allergy   . Asthma     HX  . Cancer ([place]) [id number]    ovarian  . GERD (gastroesophageal reflux disease)   . Breast cancer ([place]) 2007  . Abnormal Pap smear of cervix     h/o colposcopy/biopsy  . Hyperlipemia     on medication     Social History   Social History  . Marital Status: Married    Spouse Name: N/A  . Number of Children: N/A  . Years of Education: N/A   Occupational History  . Not on file.   Social History Main  Topics  . Smoking status: Never Smoker   . Smokeless tobacco: Never Used     Comment: NEVER USED TOBACCO  . Alcohol Use: 1.2 oz/week    2 Standard drinks or equivalent per week  . Drug Use: No  . Sexual Activity:    Partners: Male   Other Topics Concern  . Not on file   Social History Narrative    Past Surgical History  Procedure Laterality Date  . Appendectomy    . Cholecystectomy    . Breast lumpectomy      left breast  . Laparoscopic salpingo oopherectomy Left      with right tubal ligation  . Endometrial ablation      5 yrs ago. No menses since.    Family History  Problem Relation Age of Onset  . Diabetes Mother   . Colon cancer Father   . Breast cancer Maternal Aunt   . Ovarian cancer Maternal Aunt   . Breast cancer Maternal Grandmother   . Ovarian cancer Cousin   . Colon cancer Cousin   . Melanoma Cousin   . Cancer Other     breast and ovarian-maternal side    Allergies  Allergen Reactions  . Latex Rash    Causes blisters  . Peanut-Containing Drug Products Anaphylaxis  . Sulfa Antibiotics Rash and Shortness Of Breath    Shortness of breath, rash  .  Adhesive [Tape]   . Reglan [Metoclopramide] Tinitus  . Ciprofloxacin Rash  . Sulfur Rash    Current Outpatient Prescriptions on File Prior to Visit  Medication Sig Dispense Refill  . Cholecalciferol (VITAMIN D) 2000 UNITS CAPS Take 2,000 Units by mouth daily.    [name] EPINEPHrine 0.3 mg/0.3 mL IJ SOAJ injection Inject once into muscle for any allergic reaction to peanut exposure 1 Device 1  . Multiple Vitamin (MULTI VITAMIN DAILY) TABS     . ondansetron (ZOFRAN) 4 MG tablet Take 4 mg by mouth every 8 (eight) hours as needed for nausea or vomiting.    . vitamin B-12 (CYANOCOBALAMIN) 1000 MCG tablet Take 1,000 mcg by mouth daily,     No current facility-administered medications on file prior to visit.    BP 100/60 mmHg  Pulse 79  Temp(Src) 97.7 F (36.5 C) (Oral)  Ht 5' 4.5" (1.638 m)  Wt 181 lb  (82.101 kg)  BMI 30.60 kg/m2  SpO2 97%  LMP 07/22/2015       Objective:   Physical Exam  General Mental Status- Alert. General Appearance- Not in acute distress.   Skin General: Color- Normal Color. Moisture- Normal Moisture.  Neck Carotid Arteries- Normal color. Moisture- Normal Moisture. No carotid bruits. No JVD.   Chest and Lung Exam Auscultation: Breath Sounds:-Normal.   Cardiovascular Auscultation:Rythm- Regular. Murmurs & Other Heart Sounds:Auscultation of the heart reveals- No Murmurs.   Abdomen Inspection:-Inspeection Normal. Palpation/Percussion:Note:No mass. Palpation and Percussion of the abdomen reveal- Non Tender, Non Distended + BS, no rebound or guarding.  Neurologic Cranial Nerve exam:- CN III-XII intact(No nystagmus), symmetric smile. Drift Test:- No drift. Romberg Exam:- Negative.  .Finger to Nose:- Normal/Intact Strength:- 5/5 equal and symmetric strength both upper and lower extremities.      Assessment & Plan:  Your dizziness and cramps have improved since onset. But you still report some residual symptoms.  We will get labs today to see if abnormality reveals cause.  If you have dizziness that persists could try meclizine(presciption given).  If dizziness with neurologic symptoms(as discussed) then emergency department evaluation.  Follow up in 10-14 days or as needed

## 2015-08-08 ENCOUNTER — Other Ambulatory Visit (INDEPENDENT_AMBULATORY_CARE_PROVIDER_SITE_OTHER): Payer: BLUE CROSS/BLUE SHIELD

## 2015-08-08 ENCOUNTER — Telehealth: Payer: Self-pay | Admitting: Medical

## 2015-08-08 ENCOUNTER — Encounter: Payer: Self-pay | Admitting: Medical

## 2015-08-08 DIAGNOSIS — R739 Hyperglycemia, unspecified: Secondary | ICD-10-CM

## 2015-08-08 LAB — HEMOGLOBIN A1C: HEMOGLOBIN A1C: 7.9 % — AB (ref 4.6–6.5)

## 2015-08-08 MED ORDER — METFORMIN HCL 500 MG PO TABS: 500.0000 mg | ORAL_TABLET | Freq: Two times a day (BID) | ORAL | Status: DC

## 2015-08-08 NOTE — Telephone Encounter (Signed)
Left message for pt that Rx was sent into pharmacy.

## 2015-08-08 NOTE — Telephone Encounter (Signed)
Can you add a1-c to lab that was drawn yesterday?

## 2015-08-08 NOTE — Telephone Encounter (Signed)
Will you make sure a1-c runs the lab. I put in the order. Thanks.

## 2015-08-08 NOTE — Telephone Encounter (Signed)
Lab add on sheet faxed.

## 2015-08-08 NOTE — Telephone Encounter (Signed)
rx metformin sent in. 

## 2015-08-09 NOTE — Progress Notes (Signed)
Quick Note:  Pt has seen results on MyChart and message also sent for patient to call back if any questions. ______ 

## 2015-08-15 ENCOUNTER — Encounter: Payer: Self-pay | Admitting: Family

## 2015-08-22 ENCOUNTER — Other Ambulatory Visit: Payer: Self-pay | Admitting: Family

## 2015-08-22 ENCOUNTER — Ambulatory Visit (HOSPITAL_COMMUNITY)
Admission: RE | Admit: 2015-08-22 | Discharge: 2015-08-22 | Disposition: A | Discharge status: Home / Self Care | Payer: BLUE CROSS/BLUE SHIELD | Source: Ambulatory Visit | Attending: Family | Admitting: Family

## 2015-08-22 DIAGNOSIS — D271 Benign neoplasm of left ovary: Secondary | ICD-10-CM

## 2015-08-22 DIAGNOSIS — Z803 Family history of malignant neoplasm of breast: Secondary | ICD-10-CM | POA: Diagnosis not present

## 2015-08-22 DIAGNOSIS — Z8041 Family history of malignant neoplasm of ovary: Secondary | ICD-10-CM | POA: Insufficient documentation

## 2015-08-22 MED ORDER — GADOBENATE DIMEGLUMINE 529 MG/ML IV SOLN
20.0000 mL | Freq: Once | INTRAVENOUS | Status: AC | PRN
Start: 2015-08-22 — End: 2015-08-22
  Administered 2015-08-22: 17 mL via INTRAVENOUS

## 2015-08-23 ENCOUNTER — Other Ambulatory Visit: Payer: Self-pay | Admitting: Family

## 2015-08-23 DIAGNOSIS — N6310 Unspecified lump in the right breast, unspecified quadrant: Secondary | ICD-10-CM

## 2015-08-24 ENCOUNTER — Other Ambulatory Visit: Payer: Self-pay | Admitting: Family

## 2015-08-24 ENCOUNTER — Other Ambulatory Visit: Payer: Self-pay

## 2015-08-24 DIAGNOSIS — N63 Unspecified lump in unspecified breast: Secondary | ICD-10-CM

## 2015-08-25 ENCOUNTER — Encounter: Payer: Self-pay | Admitting: Family

## 2015-08-28 ENCOUNTER — Encounter: Payer: Self-pay | Admitting: Family

## 2015-08-28 ENCOUNTER — Other Ambulatory Visit: Payer: BLUE CROSS/BLUE SHIELD

## 2015-08-28 ENCOUNTER — Other Ambulatory Visit: Payer: Self-pay | Admitting: Family

## 2015-08-28 DIAGNOSIS — Z09 Encounter for follow-up examination after completed treatment for conditions other than malignant neoplasm: Secondary | ICD-10-CM

## 2015-08-28 DIAGNOSIS — N6310 Unspecified lump in the right breast, unspecified quadrant: Secondary | ICD-10-CM

## 2015-08-29 ENCOUNTER — Ambulatory Visit
Admission: RE | Admit: 2015-08-29 | Discharge: 2015-08-29 | Disposition: A | Discharge status: Home / Self Care | Payer: BLUE CROSS/BLUE SHIELD | Source: Ambulatory Visit

## 2015-08-29 ENCOUNTER — Ambulatory Visit
Admission: RE | Admit: 2015-08-29 | Discharge: 2015-08-29 | Disposition: A | Discharge status: Home / Self Care | Payer: BLUE CROSS/BLUE SHIELD | Source: Ambulatory Visit | Attending: Family | Admitting: Family

## 2015-08-29 ENCOUNTER — Other Ambulatory Visit: Payer: Self-pay | Admitting: Family

## 2015-08-29 ENCOUNTER — Encounter: Payer: Self-pay | Admitting: Family

## 2015-08-29 DIAGNOSIS — N63 Unspecified lump in unspecified breast: Secondary | ICD-10-CM

## 2015-08-30 ENCOUNTER — Encounter: Payer: Self-pay | Admitting: Medical

## 2015-08-30 ENCOUNTER — Telehealth: Payer: Self-pay | Admitting: Hematology & Oncology

## 2015-08-30 ENCOUNTER — Telehealth: Payer: Self-pay | Admitting: Family

## 2015-08-30 NOTE — Telephone Encounter (Signed)
I spoke with [name] and went over her mammogram and [place] results with her. I got verbal consent to obtain her breast scan results from [facility] for comparison per radiology's recommendation. Request is being sent by Baxter Flattery.

## 2015-08-30 NOTE — Telephone Encounter (Signed)
[employer]:  [facility] in [facility] Address: [address] [place] [address] Phone: [phone] Medical Records: [phone]   Medical Records Received:

## 2015-09-04 ENCOUNTER — Encounter: Payer: Self-pay | Admitting: Medical

## 2015-09-04 ENCOUNTER — Ambulatory Visit (INDEPENDENT_AMBULATORY_CARE_PROVIDER_SITE_OTHER): Payer: BLUE CROSS/BLUE SHIELD | Admitting: Medical

## 2015-09-04 VITALS — BP 110/70 | HR 90 | Temp 97.7°F | Ht 64.5 in | Wt 174.6 lb

## 2015-09-04 DIAGNOSIS — E119 Type 2 diabetes mellitus without complications: Secondary | ICD-10-CM | POA: Diagnosis not present

## 2015-09-04 HISTORY — DX: Type 2 diabetes mellitus without complications: E11.9

## 2015-09-04 LAB — POCT GLUCOSE (DEVICE FOR HOME USE)
GLUCOSE FASTING, POC: 132 mg/dL — AB (ref 70–99)
POC GLUCOSE: 132 mg/dL — AB (ref 70–99)

## 2015-09-04 MED ORDER — CANAGLIFLOZIN 100 MG PO TABS: 100.0000 mg | ORAL_TABLET | Freq: Every day | ORAL | Status: DC

## 2015-09-04 NOTE — Progress Notes (Signed)
Subjective:    Patient ID: Carolyn Wilson, female    DOB: [birth year], 45 y.o.   MRN: [id number]  HPI   Pt in for follow up. She states no longer has any dizziness. She also has more energy. Pt states with metformin at onset had diarrhea. But also at same time she feels had superimposed Gi illness.   Pt has not stopped med all together but is taking it one tablet a day. With one tablet a day will have 2 loose a day. That is not bothering her. She is willing just to stay on 1 tablet a day.  Pt does not have a glucometer.  Pt has not eating breakfast today.  Pt is exercising a couple of days a week.    Review of Systems  Constitutional: Negative for fever, chills and fatigue.  Respiratory: Negative for cough, chest tightness, shortness of breath and wheezing.   Cardiovascular: Negative for chest pain and palpitations.  Musculoskeletal: Negative for back pain.  Hematological: Negative for adenopathy. Does not bruise/bleed easily.  Psychiatric/Behavioral: Negative for suicidal ideas, hallucinations, behavioral problems and confusion. The patient is not nervous/anxious.      Past Medical History  Diagnosis Date  . Allergy   . Asthma     HX  . Cancer ([place]) [id number]    ovarian  . GERD (gastroesophageal reflux disease)   . Breast cancer ([place]) 2007  . Abnormal Pap smear of cervix     h/o colposcopy/biopsy  . Hyperlipemia     on medication     Social History   Social History  . Marital Status: Married    Spouse Name: N/A  . Number of Children: N/A  . Years of Education: N/A   Occupational History  . Not on file.   Social History Main Topics  . Smoking status: Never Smoker   . Smokeless tobacco: Never Used     Comment: NEVER USED TOBACCO  . Alcohol Use: 1.2 oz/week    2 Standard drinks or equivalent per week  . Drug Use: No  . Sexual Activity:    Partners: Male   Other Topics Concern  . Not on file   Social History Narrative    Past Surgical History    Procedure Laterality Date  . Appendectomy    . Cholecystectomy    . Breast lumpectomy      left breast  . Laparoscopic salpingo oopherectomy Left      with right tubal ligation  . Endometrial ablation      5 yrs ago. No menses since.    Family History  Problem Relation Age of Onset  . Diabetes Mother   . Colon cancer Father   . Breast cancer Maternal Aunt   . Ovarian cancer Maternal Aunt   . Breast cancer Maternal Grandmother   . Ovarian cancer Cousin   . Colon cancer Cousin   . Melanoma Cousin   . Cancer Other     breast and ovarian-maternal side    Allergies  Allergen Reactions  . Latex Rash    Causes blisters  . Peanut-Containing Drug Products Anaphylaxis  . Sulfa Antibiotics Rash and Shortness Of Breath    Shortness of breath, rash  . Adhesive [Tape]   . Reglan [Metoclopramide] Tinitus  . Ciprofloxacin Rash  . Sulfur Rash    Current Outpatient Prescriptions on File Prior to Visit  Medication Sig Dispense Refill  . EPINEPHrine 0.3 mg/0.3 mL IJ SOAJ injection Inject once into muscle for any allergic  reaction to peanut exposure 1 Device 1  . meclizine (ANTIVERT) 12.5 MG tablet Take 1 tablet (12.5 mg total) by mouth 3 (three) times daily as needed for dizziness. 21 tablet 0  . metFORMIN (GLUCOPHAGE) 500 MG tablet Take 1 tablet (500 mg total) by mouth 2 (two) times daily with a meal. 60 tablet 3  . Multiple Vitamin (MULTI VITAMIN DAILY) TABS     . omeprazole (PRILOSEC) 20 MG capsule Take 20 mg by mouth daily.    . ondansetron (ZOFRAN) 4 MG tablet Take 4 mg by mouth every 8 (eight) hours as needed for nausea or vomiting.    . vitamin B-12 (CYANOCOBALAMIN) 1000 MCG tablet Take 1,000 mcg by mouth daily,     No current facility-administered medications on file prior to visit.    BP 110/70 mmHg  Pulse 90  Temp(Src) 97.7 F (36.5 C) (Oral)  Ht 5' 4.5" (1.638 m)  Wt 174 lb 9.6 oz (79.198 kg)  BMI 29.52 kg/m2  SpO2 98%       Objective:   Physical  Exam   General Mental Status- Alert. General Appearance- Not in acute distress.   Skin General: Color- Normal Color. Moisture- Normal Moisture.  Neck Carotid Arteries- Normal color. Moisture- Normal Moisture. No carotid bruits. No JVD.  Chest and Lung Exam Auscultation: Breath Sounds:-Normal.  Cardiovascular Auscultation:Rythm- Regular. Murmurs & Other Heart Sounds:Auscultation of the heart reveals- No Murmurs.  Abdomen Inspection:-Inspeection Normal. Palpation/Percussion:Note:No mass. Palpation and Percussion of the abdomen reveal- Non Tender, Non Distended + BS, no rebound or guarding.   Neurologic Cranial Nerve exam:- CN III-XII intact(No nystagmus), symmetric smile. Strength:- 5/5 equal and symmetric strength both upper and lower extremities.     Assessment & Plan:  Your blood sugar today is 132. You had severe  loose stools with metformin twice daily. Now much improved with just one tablet a day.   Your GI symptoms may resolve completely and then can increase to twice daily.(Can do this after next a1c if needed)  I can add invokana today.  Check with insurance regarding if they have a preference on glucometer. Then let us know and could give rx.  Give urine microalbumin today.  Follow up in November 06, 2015 or as needed

## 2015-09-04 NOTE — Patient Instructions (Addendum)
Your blood sugar today is 132. You had severe  loose stools with metformin twice daily. Now much improved with just one tablet a day.   Your GI symptoms may resolve completely and then can increase to twice daily.(Can do this after next a1c if needed)  I can add invokana today.  Check with insurance regarding if they have a preference on glucometer. Then let us know and could give rx.  Give urine microalbumin today.  Follow up in November 06, 2015 or as needed

## 2015-09-04 NOTE — Progress Notes (Signed)
Pre visit review using our clinic review tool, if applicable. No additional management support is needed unless otherwise documented below in the visit note. 

## 2015-09-04 NOTE — Telephone Encounter (Signed)
Pt has Contour Next glucometer. Will you send her in strips. She is going to use [facility]. I asked her to let us know which store she will use.

## 2015-09-05 LAB — MICROALBUMIN, URINE: Microalb, Ur: 0.6 mg/dL

## 2015-09-05 MED ORDER — GLUCOSE BLOOD VI STRP: 1.0000 | ORAL_STRIP | Status: DC | PRN

## 2015-09-05 NOTE — Addendum Note (Signed)
Addended by: Tasia Catchings on: 09/05/2015 08:52 AM   Modules accepted: Orders

## 2015-09-05 NOTE — Addendum Note (Signed)
Addended by: Tasia Catchings on: 09/05/2015 08:55 AM   Modules accepted: Orders

## 2015-09-05 NOTE — Addendum Note (Signed)
Addended by: Tasia Catchings on: 09/05/2015 08:51 AM   Modules accepted: Orders, Medications

## 2015-09-06 ENCOUNTER — Encounter: Payer: Self-pay | Admitting: Family

## 2015-09-11 ENCOUNTER — Encounter: Payer: Self-pay | Admitting: Family

## 2015-09-12 ENCOUNTER — Encounter: Payer: Self-pay | Admitting: Family

## 2015-09-12 ENCOUNTER — Other Ambulatory Visit: Payer: Self-pay | Admitting: Family

## 2015-09-13 ENCOUNTER — Encounter: Payer: Self-pay | Admitting: Family

## 2015-09-17 ENCOUNTER — Other Ambulatory Visit: Payer: Self-pay | Admitting: Family

## 2015-09-17 DIAGNOSIS — N6001 Solitary cyst of right breast: Secondary | ICD-10-CM

## 2015-09-17 DIAGNOSIS — Z803 Family history of malignant neoplasm of breast: Secondary | ICD-10-CM

## 2015-09-20 DIAGNOSIS — K6289 Other specified diseases of anus and rectum: Secondary | ICD-10-CM

## 2015-09-20 DIAGNOSIS — K625 Hemorrhage of anus and rectum: Secondary | ICD-10-CM | POA: Insufficient documentation

## 2015-09-20 HISTORY — DX: Hemorrhage of anus and rectum: K62.5

## 2015-09-20 HISTORY — DX: Other specified diseases of anus and rectum: K62.89

## 2015-09-21 ENCOUNTER — Encounter: Payer: Self-pay | Admitting: Medical

## 2015-09-21 ENCOUNTER — Other Ambulatory Visit: Payer: Self-pay | Admitting: Family

## 2015-09-21 DIAGNOSIS — E119 Type 2 diabetes mellitus without complications: Secondary | ICD-10-CM

## 2015-09-21 DIAGNOSIS — Z803 Family history of malignant neoplasm of breast: Secondary | ICD-10-CM

## 2015-09-23 NOTE — Telephone Encounter (Signed)
See referral. Can you get her in before mid April.

## 2015-10-15 ENCOUNTER — Encounter: Payer: Self-pay | Admitting: Medical

## 2015-10-17 ENCOUNTER — Telehealth: Payer: Self-pay | Admitting: *Deleted

## 2015-10-17 MED ORDER — METFORMIN HCL ER 500 MG PO TB24: 500.0000 mg | ORAL_TABLET | Freq: Every day | ORAL | Status: DC

## 2015-10-17 MED ORDER — METFORMIN HCL ER (MOD) 500 MG PO TB24: 500.0000 mg | ORAL_TABLET | Freq: Every day | ORAL | Status: DC

## 2015-10-17 NOTE — Telephone Encounter (Signed)
I sent in metformin ER 500 mg 1 tab po a day. Hopefully her insurance will cover. Assume it will since this is what diabetic educator through insurance recommended

## 2015-10-17 NOTE — Telephone Encounter (Signed)
Received fax from [facility] stating that Metformin [glumets] 500mg  tablet is not covered by [employer]; request change to generic Glucophage XR 500  Mg/SLS 03/29

## 2015-10-17 NOTE — Telephone Encounter (Signed)
Will you change her meformin to XR. Er is not covered. Same sig

## 2015-11-21 ENCOUNTER — Encounter: Payer: Self-pay | Admitting: Medical

## 2015-11-21 ENCOUNTER — Ambulatory Visit (INDEPENDENT_AMBULATORY_CARE_PROVIDER_SITE_OTHER): Payer: BLUE CROSS/BLUE SHIELD | Admitting: Medical

## 2015-11-21 VITALS — BP 108/78 | HR 87 | Temp 98.1°F | Ht 65.0 in | Wt 162.6 lb

## 2015-11-21 DIAGNOSIS — Z0189 Encounter for other specified special examinations: Secondary | ICD-10-CM

## 2015-11-21 DIAGNOSIS — Z113 Encounter for screening for infections with a predominantly sexual mode of transmission: Secondary | ICD-10-CM

## 2015-11-21 DIAGNOSIS — R739 Hyperglycemia, unspecified: Secondary | ICD-10-CM

## 2015-11-21 DIAGNOSIS — Z0000 Encounter for general adult medical examination without abnormal findings: Secondary | ICD-10-CM

## 2015-11-21 HISTORY — DX: Encounter for general adult medical examination without abnormal findings: Z00.00

## 2015-11-21 LAB — POC URINALSYSI DIPSTICK (AUTOMATED)
BILIRUBIN UA: NEGATIVE
Blood, UA: NEGATIVE
GLUCOSE UA: NEGATIVE
Ketones, UA: NEGATIVE
LEUKOCYTES UA: NEGATIVE
NITRITE UA: NEGATIVE
Spec Grav, UA: 1.025
Urobilinogen, UA: 0.2
pH, UA: 6

## 2015-11-21 LAB — CBC WITH DIFFERENTIAL/PLATELET
BASOS PCT: 0.3 % (ref 0.0–3.0)
Basophils Absolute: 0 10*3/uL (ref 0.0–0.1)
EOS ABS: 0.2 10*3/uL (ref 0.0–0.7)
Eosinophils Relative: 1.7 % (ref 0.0–5.0)
HCT: 43.2 % (ref 36.0–46.0)
HEMOGLOBIN: 14.5 g/dL (ref 12.0–15.0)
Lymphocytes Relative: 24.7 % (ref 12.0–46.0)
Lymphs Abs: 2.5 10*3/uL (ref 0.7–4.0)
MCHC: 33.5 g/dL (ref 30.0–36.0)
MCV: 88.2 fl (ref 78.0–100.0)
MONO ABS: 0.4 10*3/uL (ref 0.1–1.0)
Monocytes Relative: 4.1 % (ref 3.0–12.0)
Neutro Abs: 6.9 10*3/uL (ref 1.4–7.7)
Neutrophils Relative %: 69.2 % (ref 43.0–77.0)
Platelets: 295 10*3/uL (ref 150.0–400.0)
RBC: 4.9 Mil/uL (ref 3.87–5.11)
RDW: 13.5 % (ref 11.5–15.5)
WBC: 10 10*3/uL (ref 4.0–10.5)

## 2015-11-21 LAB — COMPREHENSIVE METABOLIC PANEL
ALBUMIN: 4.4 g/dL (ref 3.5–5.2)
ALK PHOS: 80 U/L (ref 39–117)
ALT: 26 U/L (ref 0–35)
AST: 20 U/L (ref 0–37)
BILIRUBIN TOTAL: 0.6 mg/dL (ref 0.2–1.2)
BUN: 12 mg/dL (ref 6–23)
CO2: 25 mEq/L (ref 19–32)
CREATININE: 0.88 mg/dL (ref 0.40–1.20)
Calcium: 9.7 mg/dL (ref 8.4–10.5)
Chloride: 103 mEq/L (ref 96–112)
GFR: 74.05 mL/min (ref >60.00)
Glucose, Bld: 125 mg/dL — ABNORMAL HIGH (ref 70–99)
Potassium: 4.3 mEq/L (ref 3.5–5.1)
SODIUM: 138 meq/L (ref 135–145)
TOTAL PROTEIN: 7.5 g/dL (ref 6.0–8.3)

## 2015-11-21 LAB — LIPID PANEL
CHOLESTEROL: 221 mg/dL — AB (ref 0–200)
HDL: 36.8 mg/dL — ABNORMAL LOW (ref >39.00)
LDL Cholesterol: 155 mg/dL — ABNORMAL HIGH (ref 0–99)
NonHDL: 183.78
Total CHOL/HDL Ratio: 6
Triglycerides: 144 mg/dL (ref 0.0–149.0)
VLDL: 28.8 mg/dL (ref 0.0–40.0)

## 2015-11-21 LAB — HIV ANTIBODY (ROUTINE TESTING W REFLEX): HIV: NONREACTIVE

## 2015-11-21 LAB — TSH: TSH: 2.78 u[IU]/mL (ref 0.35–4.50)

## 2015-11-21 MED ORDER — ROSUVASTATIN CALCIUM 10 MG PO TABS: 10.0000 mg | ORAL_TABLET | Freq: Every day | ORAL | Status: DC

## 2015-11-21 MED ORDER — METFORMIN HCL ER 500 MG PO TB24: 500.0000 mg | ORAL_TABLET | Freq: Every day | ORAL | Status: DC

## 2015-11-21 NOTE — Progress Notes (Signed)
Pre visit review using our clinic review tool, if applicable. No additional management support is needed unless otherwise documented below in the visit note. 

## 2015-11-21 NOTE — Telephone Encounter (Signed)
Sent in crestor to her pharmacy. Repeat lipid panel in 3 months fasting

## 2015-11-21 NOTE — Patient Instructions (Signed)
Wellness examination Cbc, cmp, tsh,lipid panel, hiv screen and ua today.   Please try to find out which pneumovaccine was given to your in past. Then we could give second/other type.   We will repeat your a1-c in July to make sure not done too soon. But good job on your weight loss, exercise and sugar control.     Follow up mid July for a1-c.   Preventive Care for Adults, Female A healthy lifestyle and preventive care can promote health and wellness. Preventive health guidelines for women include the following key practices.  A routine yearly physical is a good way to check with your health care provider about your health and preventive screening. It is a chance to share any concerns and updates on your health and to receive a thorough exam.  Visit your dentist for a routine exam and preventive care every 6 months. Brush your teeth twice a day and floss once a day. Good oral hygiene prevents tooth decay and gum disease.  The frequency of eye exams is based on your age, health, family medical history, use of contact lenses, and other factors. Follow your health care provider's recommendations for frequency of eye exams.  Eat a healthy diet. Foods like vegetables, fruits, whole grains, low-fat dairy products, and lean protein foods contain the nutrients you need without too many calories. Decrease your intake of foods high in solid fats, added sugars, and salt. Eat the right amount of calories for you.Get information about a proper diet from your health care provider, if necessary.  Regular physical exercise is one of the most important things you can do for your health. Most adults should get at least 150 minutes of moderate-intensity exercise (any activity that increases your heart rate and causes you to sweat) each week. In addition, most adults need muscle-strengthening exercises on 2 or more days a week.  Maintain a healthy weight. The body mass index (BMI) is a screening tool to  identify possible weight problems. It provides an estimate of body fat based on height and weight. Your health care provider can find your BMI and can help you achieve or maintain a healthy weight.For adults 20 years and older:  A BMI below 18.5 is considered underweight.  A BMI of 18.5 to 24.9 is normal.  A BMI of 25 to 29.9 is considered overweight.  A BMI of 30 and above is considered obese.  Maintain normal blood lipids and cholesterol levels by exercising and minimizing your intake of saturated fat. Eat a balanced diet with plenty of fruit and vegetables. Blood tests for lipids and cholesterol should begin at age 20 and be repeated every 5 years. If your lipid or cholesterol levels are high, you are over 50, or you are at high risk for heart disease, you may need your cholesterol levels checked more frequently.Ongoing high lipid and cholesterol levels should be treated with medicines if diet and exercise are not working.  If you smoke, find out from your health care provider how to quit. If you do not use tobacco, do not start.  Lung cancer screening is recommended for adults aged 55-80 years who are at high risk for developing lung cancer because of a history of smoking. A yearly low-dose CT scan of the lungs is recommended for people who have at least a 30-pack-year history of smoking and are a current smoker or have quit within the past 15 years. A pack year of smoking is smoking an average of 1 pack of   cigarettes a day for 1 year (for example: 1 pack a day for 30 years or 2 packs a day for 15 years). Yearly screening should continue until the smoker has stopped smoking for at least 15 years. Yearly screening should be stopped for people who develop a health problem that would prevent them from having lung cancer treatment.  If you are pregnant, do not drink alcohol. If you are breastfeeding, be very cautious about drinking alcohol. If you are not pregnant and choose to drink alcohol, do  not have more than 1 drink per day. One drink is considered to be 12 ounces (355 mL) of beer, 5 ounces (148 mL) of wine, or 1.5 ounces (44 mL) of liquor.  Avoid use of street drugs. Do not share needles with anyone. Ask for help if you need support or instructions about stopping the use of drugs.  High blood pressure causes heart disease and increases the risk of stroke. Your blood pressure should be checked at least every 1 to 2 years. Ongoing high blood pressure should be treated with medicines if weight loss and exercise do not work.  If you are 37-79 years old, ask your health care provider if you should take aspirin to prevent strokes.  Diabetes screening is done by taking a blood sample to check your blood glucose level after you have not eaten for a certain period of time (fasting). If you are not overweight and you do not have risk factors for diabetes, you should be screened once every 3 years starting at age 71. If you are overweight or obese and you are 45-48 years of age, you should be screened for diabetes every year as part of your cardiovascular risk assessment.  Breast cancer screening is essential preventive care for women. You should practice "breast self-awareness." This means understanding the normal appearance and feel of your breasts and may include breast self-examination. Any changes detected, no matter how small, should be reported to a health care provider. Women in their 3s and 30s should have a clinical breast exam (CBE) by a health care provider as part of a regular health exam every 1 to 3 years. After age 69, women should have a CBE every year. Starting at age 64, women should consider having a mammogram (breast X-ray test) every year. Women who have a family history of breast cancer should talk to their health care provider about genetic screening. Women at a high risk of breast cancer should talk to their health care providers about having an MRI and a mammogram every  year.  Breast cancer gene (BRCA)-related cancer risk assessment is recommended for women who have family members with BRCA-related cancers. BRCA-related cancers include breast, ovarian, tubal, and peritoneal cancers. Having family members with these cancers may be associated with an increased risk for harmful changes (mutations) in the breast cancer genes BRCA1 and BRCA2. Results of the assessment will determine the need for genetic counseling and BRCA1 and BRCA2 testing.  Your health care provider may recommend that you be screened regularly for cancer of the pelvic organs (ovaries, uterus, and vagina). This screening involves a pelvic examination, including checking for microscopic changes to the surface of your cervix (Pap test). You may be encouraged to have this screening done every 3 years, beginning at age 28.  For women ages 22-65, health care providers may recommend pelvic exams and Pap testing every 3 years, or they may recommend the Pap and pelvic exam, combined with testing for human papilloma virus (  HPV), every 5 years. Some types of HPV increase your risk of cervical cancer. Testing for HPV may also be done on women of any age with unclear Pap test results.  Other health care providers may not recommend any screening for nonpregnant women who are considered low risk for pelvic cancer and who do not have symptoms. Ask your health care provider if a screening pelvic exam is right for you.  If you have had past treatment for cervical cancer or a condition that could lead to cancer, you need Pap tests and screening for cancer for at least 20 years after your treatment. If Pap tests have been discontinued, your risk factors (such as having a new sexual partner) need to be reassessed to determine if screening should resume. Some women have medical problems that increase the chance of getting cervical cancer. In these cases, your health care provider may recommend more frequent screening and Pap  tests.  Colorectal cancer can be detected and often prevented. Most routine colorectal cancer screening begins at the age of 50 years and continues through age 75 years. However, your health care provider may recommend screening at an earlier age if you have risk factors for colon cancer. On a yearly basis, your health care provider may provide home test kits to check for hidden blood in the stool. Use of a small camera at the end of a tube, to directly examine the colon (sigmoidoscopy or colonoscopy), can detect the earliest forms of colorectal cancer. Talk to your health care provider about this at age 50, when routine screening begins. Direct exam of the colon should be repeated every 5-10 years through age 75 years, unless early forms of precancerous polyps or small growths are found.  People who are at an increased risk for hepatitis B should be screened for this virus. You are considered at high risk for hepatitis B if:  You were born in a country where hepatitis B occurs often. Talk with your health care provider about which countries are considered high risk.  Your parents were born in a high-risk country and you have not received a shot to protect against hepatitis B (hepatitis B vaccine).  You have HIV or AIDS.  You use needles to inject street drugs.  You live with, or have sex with, someone who has hepatitis B.  You get hemodialysis treatment.  You take certain medicines for conditions like cancer, organ transplantation, and autoimmune conditions.  Hepatitis C blood testing is recommended for all people born from 1945 through 1965 and any individual with known risks for hepatitis C.  Practice safe sex. Use condoms and avoid high-risk sexual practices to reduce the spread of sexually transmitted infections (STIs). STIs include gonorrhea, chlamydia, syphilis, trichomonas, herpes, HPV, and human immunodeficiency virus (HIV). Herpes, HIV, and HPV are viral illnesses that have no cure.  They can result in disability, cancer, and death.  You should be screened for sexually transmitted illnesses (STIs) including gonorrhea and chlamydia if:  You are sexually active and are younger than 24 years.  You are older than 24 years and your health care provider tells you that you are at risk for this type of infection.  Your sexual activity has changed since you were last screened and you are at an increased risk for chlamydia or gonorrhea. Ask your health care provider if you are at risk.  If you are at risk of being infected with HIV, it is recommended that you take a prescription medicine daily to   prevent HIV infection. This is called preexposure prophylaxis (PrEP). You are considered at risk if:  You are sexually active and do not regularly use condoms or know the HIV status of your partner(s).  You take drugs by injection.  You are sexually active with a partner who has HIV.  Talk with your health care provider about whether you are at high risk of being infected with HIV. If you choose to begin PrEP, you should first be tested for HIV. You should then be tested every 3 months for as long as you are taking PrEP.  Osteoporosis is a disease in which the bones lose minerals and strength with aging. This can result in serious bone fractures or breaks. The risk of osteoporosis can be identified using a bone density scan. Women ages 74 years and over and women at risk for fractures or osteoporosis should discuss screening with their health care providers. Ask your health care provider whether you should take a calcium supplement or vitamin D to reduce the rate of osteoporosis.  Menopause can be associated with physical symptoms and risks. Hormone replacement therapy is available to decrease symptoms and risks. You should talk to your health care provider about whether hormone replacement therapy is right for you.  Use sunscreen. Apply sunscreen liberally and repeatedly throughout the  day. You should seek shade when your shadow is shorter than you. Protect yourself by wearing long sleeves, pants, a wide-brimmed hat, and sunglasses year round, whenever you are outdoors.  Once a month, do a whole body skin exam, using a mirror to look at the skin on your back. Tell your health care provider of new moles, moles that have irregular borders, moles that are larger than a pencil eraser, or moles that have changed in shape or color.  Stay current with required vaccines (immunizations).  Influenza vaccine. All adults should be immunized every year.  Tetanus, diphtheria, and acellular pertussis (Td, Tdap) vaccine. Pregnant women should receive 1 dose of Tdap vaccine during each pregnancy. The dose should be obtained regardless of the length of time since the last dose. Immunization is preferred during the 27th-36th week of gestation. An adult who has not previously received Tdap or who does not know her vaccine status should receive 1 dose of Tdap. This initial dose should be followed by tetanus and diphtheria toxoids (Td) booster doses every 10 years. Adults with an unknown or incomplete history of completing a 3-dose immunization series with Td-containing vaccines should begin or complete a primary immunization series including a Tdap dose. Adults should receive a Td booster every 10 years.  Varicella vaccine. An adult without evidence of immunity to varicella should receive 2 doses or a second dose if she has previously received 1 dose. Pregnant females who do not have evidence of immunity should receive the first dose after pregnancy. This first dose should be obtained before leaving the health care facility. The second dose should be obtained 4-8 weeks after the first dose.  Human papillomavirus (HPV) vaccine. Females aged 13-26 years who have not received the vaccine previously should obtain the 3-dose series. The vaccine is not recommended for use in pregnant females. However, pregnancy  testing is not needed before receiving a dose. If a female is found to be pregnant after receiving a dose, no treatment is needed. In that case, the remaining doses should be delayed until after the pregnancy. Immunization is recommended for any person with an immunocompromised condition through the age of 38 years if she  did not get any or all doses earlier. During the 3-dose series, the second dose should be obtained 4-8 weeks after the first dose. The third dose should be obtained 24 weeks after the first dose and 16 weeks after the second dose.  Zoster vaccine. One dose is recommended for adults aged 60 years or older unless certain conditions are present.  Measles, mumps, and rubella (MMR) vaccine. Adults born before 1957 generally are considered immune to measles and mumps. Adults born in 1957 or later should have 1 or more doses of MMR vaccine unless there is a contraindication to the vaccine or there is laboratory evidence of immunity to each of the three diseases. A routine second dose of MMR vaccine should be obtained at least 28 days after the first dose for students attending postsecondary schools, health care workers, or international travelers. People who received inactivated measles vaccine or an unknown type of measles vaccine during 1963-1967 should receive 2 doses of MMR vaccine. People who received inactivated mumps vaccine or an unknown type of mumps vaccine before 1979 and are at high risk for mumps infection should consider immunization with 2 doses of MMR vaccine. For females of childbearing age, rubella immunity should be determined. If there is no evidence of immunity, females who are not pregnant should be vaccinated. If there is no evidence of immunity, females who are pregnant should delay immunization until after pregnancy. Unvaccinated health care workers born before 1957 who lack laboratory evidence of measles, mumps, or rubella immunity or laboratory confirmation of disease should  consider measles and mumps immunization with 2 doses of MMR vaccine or rubella immunization with 1 dose of MMR vaccine.  Pneumococcal 13-valent conjugate (PCV13) vaccine. When indicated, a person who is uncertain of his immunization history and has no record of immunization should receive the PCV13 vaccine. All adults 65 years of age and older should receive this vaccine. An adult aged 19 years or older who has certain medical conditions and has not been previously immunized should receive 1 dose of PCV13 vaccine. This PCV13 should be followed with a dose of pneumococcal polysaccharide (PPSV23) vaccine. Adults who are at high risk for pneumococcal disease should obtain the PPSV23 vaccine at least 8 weeks after the dose of PCV13 vaccine. Adults older than 45 years of age who have normal immune system function should obtain the PPSV23 vaccine dose at least 1 year after the dose of PCV13 vaccine.  Pneumococcal polysaccharide (PPSV23) vaccine. When PCV13 is also indicated, PCV13 should be obtained first. All adults aged 65 years and older should be immunized. An adult younger than age 65 years who has certain medical conditions should be immunized. Any person who resides in a nursing home or long-term care facility should be immunized. An adult smoker should be immunized. People with an immunocompromised condition and certain other conditions should receive both PCV13 and PPSV23 vaccines. People with human immunodeficiency virus (HIV) infection should be immunized as soon as possible after diagnosis. Immunization during chemotherapy or radiation therapy should be avoided. Routine use of PPSV23 vaccine is not recommended for American Indians, [place] Natives, or people younger than 65 years unless there are medical conditions that require PPSV23 vaccine. When indicated, people who have unknown immunization and have no record of immunization should receive PPSV23 vaccine. One-time revaccination 5 years after the first  dose of PPSV23 is recommended for people aged 19-64 years who have chronic kidney failure, nephrotic syndrome, asplenia, or immunocompromised conditions. People who received 1-2 doses of PPSV23   before age 65 years should receive another dose of PPSV23 vaccine at age 65 years or later if at least 5 years have passed since the previous dose. Doses of PPSV23 are not needed for people immunized with PPSV23 at or after age 65 years.  Meningococcal vaccine. Adults with asplenia or persistent complement component deficiencies should receive 2 doses of quadrivalent meningococcal conjugate (MenACWY-D) vaccine. The doses should be obtained at least 2 months apart. Microbiologists working with certain meningococcal bacteria, military recruits, people at risk during an outbreak, and people who travel to or live in countries with a high rate of meningitis should be immunized. A first-year college student up through age 21 years who is living in a residence hall should receive a dose if she did not receive a dose on or after her 16th birthday. Adults who have certain high-risk conditions should receive one or more doses of vaccine.  Hepatitis A vaccine. Adults who wish to be protected from this disease, have certain high-risk conditions, work with hepatitis A-infected animals, work in hepatitis A research labs, or travel to or work in countries with a high rate of hepatitis A should be immunized. Adults who were previously unvaccinated and who anticipate close contact with an international adoptee during the first 60 days after arrival in the [place] from a country with a high rate of hepatitis A should be immunized.  Hepatitis B vaccine. Adults who wish to be protected from this disease, have certain high-risk conditions, may be exposed to blood or other infectious body fluids, are household contacts or sex partners of hepatitis B positive people, are clients or workers in certain care facilities, or travel to or  work in countries with a high rate of hepatitis B should be immunized.  Haemophilus influenzae type b (Hib) vaccine. A previously unvaccinated person with asplenia or sickle cell disease or having a scheduled splenectomy should receive 1 dose of Hib vaccine. Regardless of previous immunization, a recipient of a hematopoietic stem cell transplant should receive a 3-dose series 6-12 months after her successful transplant. Hib vaccine is not recommended for adults with HIV infection. Preventive Services / Frequency Ages 19 to 39 years  Blood pressure check.** / Every 3-5 years.  Lipid and cholesterol check.** / Every 5 years beginning at age 20.  Clinical breast exam.** / Every 3 years for women in their 20s and 30s.  BRCA-related cancer risk assessment.** / For women who have family members with a BRCA-related cancer (breast, ovarian, tubal, or peritoneal cancers).  Pap test.** / Every 2 years from ages 21 through 29. Every 3 years starting at age 30 through age 65 or 70 with a history of 3 consecutive normal Pap tests.  HPV screening.** / Every 3 years from ages 30 through ages 65 to 70 with a history of 3 consecutive normal Pap tests.  Hepatitis C blood test.** / For any individual with known risks for hepatitis C.  Skin self-exam. / Monthly.  Influenza vaccine. / Every year.  Tetanus, diphtheria, and acellular pertussis (Tdap, Td) vaccine.** / Consult your health care provider. Pregnant women should receive 1 dose of Tdap vaccine during each pregnancy. 1 dose of Td every 10 years.  Varicella vaccine.** / Consult your health care provider. Pregnant females who do not have evidence of immunity should receive the first dose after pregnancy.  HPV vaccine. / 3 doses over 6 months, if 26 and younger. The vaccine is not recommended for use in pregnant females. However, pregnancy testing is   not needed before receiving a dose.  Measles, mumps, rubella (MMR) vaccine.** / You need at least 1 dose  of MMR if you were born in 1957 or later. You may also need a 2nd dose. For females of childbearing age, rubella immunity should be determined. If there is no evidence of immunity, females who are not pregnant should be vaccinated. If there is no evidence of immunity, females who are pregnant should delay immunization until after pregnancy.  Pneumococcal 13-valent conjugate (PCV13) vaccine.** / Consult your health care provider.  Pneumococcal polysaccharide (PPSV23) vaccine.** / 1 to 2 doses if you smoke cigarettes or if you have certain conditions.  Meningococcal vaccine.** / 1 dose if you are age 19 to 21 years and a first-year college student living in a residence hall, or have one of several medical conditions, you need to get vaccinated against meningococcal disease. You may also need additional booster doses.  Hepatitis A vaccine.** / Consult your health care provider.  Hepatitis B vaccine.** / Consult your health care provider.  Haemophilus influenzae type b (Hib) vaccine.** / Consult your health care provider. Ages 40 to 64 years  Blood pressure check.** / Every year.  Lipid and cholesterol check.** / Every 5 years beginning at age 20 years.  Lung cancer screening. / Every year if you are aged 55-80 years and have a 30-pack-year history of smoking and currently smoke or have quit within the past 15 years. Yearly screening is stopped once you have quit smoking for at least 15 years or develop a health problem that would prevent you from having lung cancer treatment.  Clinical breast exam.** / Every year after age 40 years.  BRCA-related cancer risk assessment.** / For women who have family members with a BRCA-related cancer (breast, ovarian, tubal, or peritoneal cancers).  Mammogram.** / Every year beginning at age 40 years and continuing for as long as you are in good health. Consult with your health care provider.  Pap test.** / Every 3 years starting at age 30 years through age  65 or 70 years with a history of 3 consecutive normal Pap tests.  HPV screening.** / Every 3 years from ages 30 years through ages 65 to 70 years with a history of 3 consecutive normal Pap tests.  Fecal occult blood test (FOBT) of stool. / Every year beginning at age 50 years and continuing until age 75 years. You may not need to do this test if you get a colonoscopy every 10 years.  Flexible sigmoidoscopy or colonoscopy.** / Every 5 years for a flexible sigmoidoscopy or every 10 years for a colonoscopy beginning at age 50 years and continuing until age 75 years.  Hepatitis C blood test.** / For all people born from 1945 through 1965 and any individual with known risks for hepatitis C.  Skin self-exam. / Monthly.  Influenza vaccine. / Every year.  Tetanus, diphtheria, and acellular pertussis (Tdap/Td) vaccine.** / Consult your health care provider. Pregnant women should receive 1 dose of Tdap vaccine during each pregnancy. 1 dose of Td every 10 years.  Varicella vaccine.** / Consult your health care provider. Pregnant females who do not have evidence of immunity should receive the first dose after pregnancy.  Zoster vaccine.** / 1 dose for adults aged 60 years or older.  Measles, mumps, rubella (MMR) vaccine.** / You need at least 1 dose of MMR if you were born in 1957 or later. You may also need a second dose. For females of childbearing age, rubella immunity   should be determined. If there is no evidence of immunity, females who are not pregnant should be vaccinated. If there is no evidence of immunity, females who are pregnant should delay immunization until after pregnancy.  Pneumococcal 13-valent conjugate (PCV13) vaccine.** / Consult your health care provider.  Pneumococcal polysaccharide (PPSV23) vaccine.** / 1 to 2 doses if you smoke cigarettes or if you have certain conditions.  Meningococcal vaccine.** / Consult your health care provider.  Hepatitis A vaccine.** / Consult your  health care provider.  Hepatitis B vaccine.** / Consult your health care provider.  Haemophilus influenzae type b (Hib) vaccine.** / Consult your health care provider. Ages 13 years and over  Blood pressure check.** / Every year.  Lipid and cholesterol check.** / Every 5 years beginning at age 85 years.  Lung cancer screening. / Every year if you are aged 41-80 years and have a 30-pack-year history of smoking and currently smoke or have quit within the past 15 years. Yearly screening is stopped once you have quit smoking for at least 15 years or develop a health problem that would prevent you from having lung cancer treatment.  Clinical breast exam.** / Every year after age 25 years.  BRCA-related cancer risk assessment.** / For women who have family members with a BRCA-related cancer (breast, ovarian, tubal, or peritoneal cancers).  Mammogram.** / Every year beginning at age 49 years and continuing for as long as you are in good health. Consult with your health care provider.  Pap test.** / Every 3 years starting at age 24 years through age 65 or 89 years with 3 consecutive normal Pap tests. Testing can be stopped between 65 and 70 years with 3 consecutive normal Pap tests and no abnormal Pap or HPV tests in the past 10 years.  HPV screening.** / Every 3 years from ages 49 years through ages 26 or 44 years with a history of 3 consecutive normal Pap tests. Testing can be stopped between 65 and 70 years with 3 consecutive normal Pap tests and no abnormal Pap or HPV tests in the past 10 years.  Fecal occult blood test (FOBT) of stool. / Every year beginning at age 12 years and continuing until age 11 years. You may not need to do this test if you get a colonoscopy every 10 years.  Flexible sigmoidoscopy or colonoscopy.** / Every 5 years for a flexible sigmoidoscopy or every 10 years for a colonoscopy beginning at age 52 years and continuing until age 64 years.  Hepatitis C blood test.** /  For all people born from [birth year] through 1965 and any individual with known risks for hepatitis C.  Osteoporosis screening.** / A one-time screening for women ages 84 years and over and women at risk for fractures or osteoporosis.  Skin self-exam. / Monthly.  Influenza vaccine. / Every year.  Tetanus, diphtheria, and acellular pertussis (Tdap/Td) vaccine.** / 1 dose of Td every 10 years.  Varicella vaccine.** / Consult your health care provider.  Zoster vaccine.** / 1 dose for adults aged 96 years or older.  Pneumococcal 13-valent conjugate (PCV13) vaccine.** / Consult your health care provider.  Pneumococcal polysaccharide (PPSV23) vaccine.** / 1 dose for all adults aged 75 years and older.  Meningococcal vaccine.** / Consult your health care provider.  Hepatitis A vaccine.** / Consult your health care provider.  Hepatitis B vaccine.** / Consult your health care provider.  Haemophilus influenzae type b (Hib) vaccine.** / Consult your health care provider. ** Family history and personal history of  risk and conditions may change your health care provider's recommendations.   This information is not intended to replace advice given to you by your health care provider. Make sure you discuss any questions you have with your health care provider.   Document Released: 09/02/2001 Document Revised: 07/28/2014 Document Reviewed: 12/02/2010 Elsevier Interactive Patient Education [employer].

## 2015-11-21 NOTE — Assessment & Plan Note (Signed)
Cbc, cmp, tsh,lipid panel, hiv screen and ua today.   Please try to find out which pneumovaccine was given to your in past. Then we could give second/other type.   We will repeat your a1-c in July to make sure not done too soon. But good job on your weight loss, exercise and sugar control.

## 2015-11-21 NOTE — Progress Notes (Signed)
Subjective:    Patient ID: Carolyn Wilson, female    DOB: 01/17/1971, 45 y.o.   MRN: [id number]  HPI Pt here for cpe.  I have reviewed pt PMH, PSH, FH, Social History and Surgical History  Pt eating well recently. No sodas. Avoid processed foods with sugars. Exercising 4 days a week.   Pt is fasting.  Pt last pap smear sept 2016 and normal. [facility] Pt had mammogram just recently. Some finding that were worrisome but in end radiologist from [facility] Radiologist stated negative and area of concern had already been biopsied.  Pt not sure which pneumonia vaccine she got in past. She will investigate which one she got.  Pt went to her optometrist in January. Pt had eye exam in January. No comments on changes related to diabetes although. She was not aware of diagnosis at the time.  Will get hiv screening.   Pt gives update that sugars have been in controlled.     Review of Systems  Constitutional: Negative for fever, chills, activity change and fatigue.  Respiratory: Negative for cough, chest tightness and shortness of breath.   Cardiovascular: Negative for chest pain, palpitations and leg swelling.  Gastrointestinal: Negative for nausea, vomiting and abdominal pain.  Musculoskeletal: Negative for neck pain and neck stiffness.  Skin: Negative for pallor and rash.  Neurological: Negative for dizziness, seizures, syncope, weakness, light-headedness and headaches.  Hematological: Negative for adenopathy. Does not bruise/bleed easily.  Psychiatric/Behavioral: Negative for behavioral problems, confusion and agitation. The patient is not nervous/anxious.     Past Medical History  Diagnosis Date  . Allergy   . Asthma     HX  . Cancer ([place]) [id number]    ovarian  . GERD (gastroesophageal reflux disease)   . Breast cancer ([place]) 2007  . Abnormal Pap smear of cervix     h/o colposcopy/biopsy  . Hyperlipemia     on  medication      Social History   Social History  . Marital Status: Married    Spouse Name: N/A  . Number of Children: N/A  . Years of Education: N/A   Occupational History  . Not on file.   Social History Main Topics  . Smoking status: Never Smoker   . Smokeless tobacco: Never Used     Comment: NEVER USED TOBACCO  . Alcohol Use: 1.2 oz/week    2 Standard drinks or equivalent per week  . Drug Use: No  . Sexual Activity:    Partners: Male   Other Topics Concern  . Not on file   Social History Narrative    Past Surgical History  Procedure Laterality Date  . Appendectomy    . Cholecystectomy    . Breast lumpectomy      left breast  . Laparoscopic salpingo oopherectomy Left      with right tubal ligation  . Endometrial ablation      5 yrs ago. No menses since.    Family History  Problem Relation Age of Onset  . Diabetes Mother   . Colon cancer Father   . Breast cancer Maternal Aunt   . Ovarian cancer Maternal Aunt   . Breast cancer Maternal Grandmother   . Ovarian cancer Cousin   . Colon cancer Cousin   . Melanoma Cousin   . Cancer Other     breast and ovarian-maternal side    Allergies  Allergen Reactions  . Latex Rash  Causes blisters  . Peanut-Containing Drug Products Anaphylaxis  . Sulfa Antibiotics Rash and Shortness Of Breath    Shortness of breath, rash  . Adhesive [Tape]   . Reglan [Metoclopramide] Tinitus  . Ciprofloxacin Rash  . Sulfur Rash    Current Outpatient Prescriptions on File Prior to Visit  Medication Sig Dispense Refill  . EPINEPHrine 0.3 mg/0.3 mL IJ SOAJ injection Inject once into muscle for any allergic reaction to peanut exposure 1 Device 1  . glucose blood (BAYER CONTOUR NEXT TEST) test strip 1 each by Other route as needed for other. Check blood sugar twice a day before meals and as needed. E11.9 100 each 10  . meclizine (ANTIVERT) 12.5 MG tablet Take 1 tablet (12.5 mg total) by mouth 3 (three) times daily as needed for  dizziness. 21 tablet 0  . Multiple Vitamin (MULTI VITAMIN DAILY) TABS     . omeprazole (PRILOSEC) 20 MG capsule Take 20 mg by mouth daily.    . ondansetron (ZOFRAN) 4 MG tablet Take 4 mg by mouth every 8 (eight) hours as needed for nausea or vomiting.    . vitamin B-12 (CYANOCOBALAMIN) 1000 MCG tablet Take 1,000 mcg by mouth daily,     No current facility-administered medications on file prior to visit.    BP 108/78 mmHg  Pulse 87  Temp(Src) 98.1 F (36.7 C) (Oral)  Ht 5\' 5"  (1.651 m)  Wt 162 lb 9.6 oz (73.755 kg)  BMI 27.06 kg/m2  SpO2 98%       Objective:   Physical Exam  .General Mental Status- Alert. General Appearance- Not in acute distress.   Skin General: Color- Normal Color. Moisture- Normal Moisture. No worrisome lesion on skin.  Neck Carotid Arteries- Normal color. Moisture- Normal Moisture. No carotid bruits. No JVD.  Chest and Lung Exam Auscultation: Breath Sounds:-Normal.  Cardiovascular Auscultation:Rythm- Regular. Murmurs & Other Heart Sounds:Auscultation of the heart reveals- No Murmurs.  Abdomen Inspection:-Inspeection Normal. Palpation/Percussion:Note:No mass. Palpation and Percussion of the abdomen reveal- Non Tender, Non Distended + BS, no rebound or guarding.  Neurologic Cranial Nerve exam:- CN III-XII intact(No nystagmus), symmetric smile. Strength:- 5/5 equal and symmetric strength both upper and lower extremities.      Assessment & Plan:  Wellness examination Cbc, cmp, tsh,lipid panel, hiv screen and ua today.   Please try to find out which pneumovaccine was given to your in past. Then we could give second/other type.   We will repeat your a1-c in July to make sure not done too soon. But good job on your weight loss, exercise and sugar control.

## 2015-11-22 NOTE — Progress Notes (Signed)
Quick Note:  Pt has seen results on MyChart and message also sent for patient to call back if any questions. ______ 

## 2015-11-27 NOTE — Telephone Encounter (Signed)
Its already in her chart. Its been updated.

## 2015-11-27 NOTE — Telephone Encounter (Signed)
Pt states doctor in [place] records show psv 23 done 05-22-2012 and 05-12-2002. Will you abstract that. Thanks.

## 2015-12-19 ENCOUNTER — Encounter: Payer: Self-pay | Admitting: Medical

## 2015-12-24 ENCOUNTER — Encounter: Payer: Self-pay | Admitting: Obstetrics & Gynecology

## 2015-12-25 ENCOUNTER — Telehealth: Payer: Self-pay

## 2015-12-25 NOTE — Telephone Encounter (Signed)
Telephone encounter created to discuss mychart message with patient. 

## 2015-12-25 NOTE — Telephone Encounter (Signed)
Non-Urgent Medical Question  Message [id number]   From  Zeyah Cobo Hickle   To  Megan Salon, MD   Sent  12/24/2015 1:37 PM     Good Afternoon,  For about the past week I have developed what I think are hot flashes.. I feel like my temperature is high but when you feel of me I am in a cold sweat. It happens several times a day and night with not real reason for it.. How do I know if it's hot flashes or something else going on? Is there anything I can do to help them? Sorry for all the questions...  Thanks,  Carolyn Wilson      Responsible Party    Pool - [facility] Clinical Pool No one has taken responsibility for this message.     No actions have been taken on this message.     Spoke with patient. Patient states that for about a week she has been feeling very hot like she has a temperature, but when she feels her skin she is in a "cold sweat." Has taken her temperature and denies fever. Denies any recent illness or any other symptoms. Advised she will need to be seen in the office for further evaluation and possible lab work. She is agreeable. Appointment scheduled for 12/27/2015 at 8:45 am with Dr.Miller. She is agreeable to date and time.  Routing to provider for final review. Patient agreeable to disposition. Will close encounter.

## 2015-12-27 ENCOUNTER — Encounter: Payer: Self-pay | Admitting: Obstetrics & Gynecology

## 2015-12-27 ENCOUNTER — Ambulatory Visit (INDEPENDENT_AMBULATORY_CARE_PROVIDER_SITE_OTHER): Payer: BLUE CROSS/BLUE SHIELD | Admitting: Obstetrics & Gynecology

## 2015-12-27 VITALS — BP 118/76 | HR 84 | Resp 16 | Wt 159.0 lb

## 2015-12-27 DIAGNOSIS — N951 Menopausal and female climacteric states: Secondary | ICD-10-CM | POA: Diagnosis not present

## 2015-12-27 DIAGNOSIS — R232 Flushing: Secondary | ICD-10-CM

## 2015-12-27 NOTE — Progress Notes (Signed)
GYNECOLOGY  VISIT   HPI: 45 y.o. G67P0101 Married Caucasian female with 2 1/2 to 3 weeks of hot flashes that seem to occur around 5-6am.  This isn't really happening at night.  She hasn't noted increased vaginal dryness.    Started Glucophage in January when diagnosed with diabetes.  Pt reports she didn't tolerated it from GI standpoint.  She has been on the ER since April.  She did see a GI specialist during this time due to the significant changes with her GI tract while she was on the non-extended release glucophage.  Seeing endocrinologist at [facility].  Last hbA1C was 09/25/15 was 6.9.    Pt would like to consider HRT if appropriate.  Discussed with patient risks and benefits and specifically the WHI study including but not limited to risks of increased risks of heart disease, MI, stroke, DVT, and breast cancer.  Increased risks of gall bladder disease and change in cholesterol panels also discussed.  Possibility of bleeding was discussed as patient does have a uterus.  This is not likely considering the hx of endometrial ablation and no bleeding since the procedure.  Benefits of improved quality of life, improved bone density and decreased risks of colon cancer also discussed.   Lastly, pt reports she will NOT go back to the breast center for any breast evaluation.  Had right breast finding and need for possible biopsy.  Spent almost two hours trying to convince them that she does not need to have the left breast biopsied.  States there was "confusion" and this was very nerve wracking.  Supposed to have six month follow up planned.  Patient Active Problem List   Diagnosis Date Noted  . Wellness examination 11/21/2015  . Diabetes ([place]) 09/04/2015  . Allergy with anaphylaxis due to peanuts 08/13/2014  . Abnormal Pap smear of cervix  remote Leep  1996 per pt 08/13/2014  . Allergic rhinitis 08/13/2014  . S/P endometrial ablation 08/13/2014  . GERD (gastroesophageal reflux disease) 08/13/2014  .  Diarrhea post cholecystectomy since 1999 08/13/2014  . FHx: colon cancer  father  08/13/2014  . 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 Headache, migraine 08/02/2012  . Family history of breast cancer 01/06/2012  . Family history of malignant neoplasm of ovary 01/06/2012    Past Medical History  Diagnosis Date  . Allergy   . Asthma     HX  . Cancer ([place]) [id number]    ovarian  . GERD (gastroesophageal reflux disease)   . Breast cancer ([place]) 2007  . Abnormal Pap smear of cervix     h/o colposcopy/biopsy  . Hyperlipemia     on medication   . Diabetes mellitus without complication ([place]) [id number]     diagnosed by PCP, [name]     Past Surgical History  Procedure Laterality Date  . Appendectomy    . Cholecystectomy    . Breast lumpectomy      left breast  . Laparoscopic salpingo oopherectomy Left      with right tubal ligation  . Endometrial ablation      5 yrs ago. No menses since.    MEDS:  Reviewed in EPIC and UTD  ALLERGIES: Latex; Peanut-containing drug products; Sulfa antibiotics; Adhesive; Canagliflozin; Reglan; Ciprofloxacin; and Sulfur  Family History  Problem Relation Age of Onset  . Diabetes Mother   . Colon cancer Father   . Breast cancer Maternal Aunt   . Ovarian cancer Maternal Aunt   . Breast cancer Maternal Grandmother   .  Ovarian cancer Cousin   . Colon cancer Cousin   . Melanoma Cousin   . Cancer Other     breast and ovarian-maternal side    SH:  Married, non smoker  Review of Systems  All other systems reviewed and are negative.   PHYSICAL EXAMINATION:    BP 118/76 mmHg  Pulse 84  Resp 16  Wt 159 lb (72.122 kg)    Physical Exam  Constitutional: She is oriented to person, place, and time. She appears well-developed and well-nourished.  Neurological: She is alert and oriented to person, place, and time.  Skin: Skin is warm and dry.  Psychiatric: She has a normal mood and affect.   Assessment: Hot flashes,  possibly menopausal related H/O endometrial ablation Recent diagnosis of diabetes H/O Laparoscopic LSO with tubal libation  Plan: FSH and estradiol levels today.  If Benedict in menopausal range, will plan to start HRT.   ~25 minutes spent with patient >50% of time was in face to face discussion of above.

## 2015-12-28 LAB — FOLLICLE STIMULATING HORMONE: FSH: 67.8 m[IU]/mL

## 2015-12-28 LAB — ESTRADIOL: ESTRADIOL: 26 pg/mL

## 2015-12-31 ENCOUNTER — Telehealth: Payer: Self-pay | Admitting: Obstetrics & Gynecology

## 2015-12-31 MED ORDER — ESTRADIOL-NORETHINDRONE ACET 0.5-0.1 MG PO TABS: 1.0000 | ORAL_TABLET | Freq: Every day | ORAL | Status: DC

## 2015-12-31 NOTE — Telephone Encounter (Signed)
Patient calling for recent lab results.

## 2015-12-31 NOTE — Telephone Encounter (Signed)
Call to patient. Advised results still pending. Patient states just very anxious. PCP indicated she would need additional testing if these labs were normal. Advised would check with Dr [name] Heck to let her know of concerns and call her back as soon as possible.

## 2015-12-31 NOTE — Addendum Note (Signed)
Addended by: Megan Salon on: 12/31/2015 05:57 PM   Modules accepted: Orders

## 2016-01-02 NOTE — Telephone Encounter (Signed)
This phone message was actually prior to her receiving the phone call with initial results. She was worried if the results did not show menopause, then something else was wrong. She has talked to [facility], see result note and has office visit scheduled for 01-18-16. Encounter closed.

## 2016-01-02 NOTE — Telephone Encounter (Signed)
I'm not sure I understand her concerns about being post menopausal.  We discussed this thoroughly, I thought.  Please schedule OV, again, so we can discuss further.  Thanks.

## 2016-01-03 ENCOUNTER — Encounter: Payer: Self-pay | Admitting: Medical

## 2016-01-15 ENCOUNTER — Telehealth: Payer: Self-pay | Admitting: Obstetrics & Gynecology

## 2016-01-15 NOTE — Telephone Encounter (Signed)
Patient called and cancelled her appointment for 01/18/16 with Dr. [name] Heck for a "medication concern." She will be out of town from 01/18/16-02/04/16. She said, "I am not really having any symptoms right now so I am fine to wait until 02/07/16." She was already scheduled for a one month follow up on 02/07/16 so she will discuss her concerns then.  FYI only.

## 2016-01-18 ENCOUNTER — Ambulatory Visit: Payer: Self-pay | Admitting: Obstetrics & Gynecology

## 2016-01-21 ENCOUNTER — Telehealth: Payer: Self-pay | Admitting: Medical

## 2016-01-21 NOTE — Telephone Encounter (Signed)
Order placed for A1C pt contacted to inform that the orders are in. Pt will come in on 01/23/16 for A1C recheck.

## 2016-01-21 NOTE — Telephone Encounter (Signed)
Pt called in because she says that she received a reminding to schedule an appt. to have A1C checked. Pt would like to have orders placed. Pt's lab appt is scheduled for Wednesday.

## 2016-01-21 NOTE — Addendum Note (Signed)
Addended by: Emi Holes on: 01/21/2016 10:36 AM   Modules accepted: Orders

## 2016-01-23 ENCOUNTER — Other Ambulatory Visit: Payer: BLUE CROSS/BLUE SHIELD

## 2016-01-25 ENCOUNTER — Encounter: Payer: Self-pay | Admitting: Medical

## 2016-01-25 ENCOUNTER — Other Ambulatory Visit (INDEPENDENT_AMBULATORY_CARE_PROVIDER_SITE_OTHER): Payer: BLUE CROSS/BLUE SHIELD

## 2016-01-25 DIAGNOSIS — R739 Hyperglycemia, unspecified: Secondary | ICD-10-CM | POA: Diagnosis not present

## 2016-01-25 LAB — HEMOGLOBIN A1C: Hgb A1c MFr Bld: 6.5 % (ref 4.6–6.5)

## 2016-01-28 ENCOUNTER — Other Ambulatory Visit: Payer: Self-pay

## 2016-01-28 MED ORDER — METFORMIN HCL ER 500 MG PO TB24: 500.0000 mg | ORAL_TABLET | Freq: Every day | ORAL | Status: DC

## 2016-01-28 NOTE — Telephone Encounter (Signed)
Rx for metformin filled 01/28/16 until patient comes in the office for a follow up appointment.

## 2016-01-28 NOTE — Progress Notes (Signed)
Quick Note:  Pt has seen results on MyChart and message also sent for patient to call back if any questions. ______ 

## 2016-02-07 ENCOUNTER — Encounter: Payer: Self-pay | Admitting: Obstetrics & Gynecology

## 2016-02-07 ENCOUNTER — Ambulatory Visit (INDEPENDENT_AMBULATORY_CARE_PROVIDER_SITE_OTHER): Payer: BLUE CROSS/BLUE SHIELD | Admitting: Obstetrics & Gynecology

## 2016-02-07 VITALS — BP 118/74 | HR 92 | Resp 16 | Ht 65.0 in | Wt 159.2 lb

## 2016-02-07 DIAGNOSIS — E349 Endocrine disorder, unspecified: Secondary | ICD-10-CM

## 2016-02-07 DIAGNOSIS — Z840 Family history of diseases of the skin and subcutaneous tissue: Secondary | ICD-10-CM

## 2016-02-07 DIAGNOSIS — Z842 Family history of other diseases of the genitourinary system: Secondary | ICD-10-CM

## 2016-02-07 NOTE — Progress Notes (Signed)
Scheduled patient while in office for bilateral 3D screening mammogram on 03/17/2016 at 9:15 am at [facility]. Patient is agreeable to date and time. ROI form filled out by patient while in office and faxed to the [facility] for release of records to [place]. Patient placed in mammogram hold.

## 2016-02-09 NOTE — Progress Notes (Signed)
GYNECOLOGY  VISIT   HPI: 45 y.o. G25P0101 Married Caucasian female to discuss two issues.  First is about recommendation to start HRT.  Pt was seen in June with complaint of hot flashes.  FSH was obtained and was 50.  Estradiol level was low as well.  D/W pt this indicated menopausal range.  She is confused how this could be possible "without knowing it".  Pt with hx of endometrial ablation so she hasn't bled in many years.  D/W pt, typically, first symptoms noted approaching menopause is menstrual cycle changes.  With ablation, she did not have any of these.  Typical menopausal symptoms reviewed as well as possibility of really having very minimal symptoms.   Pt reports that she started on lipid lowering agent and wondered if this was the cause of hot flashes so she stopped it.  Hot flashes have resolved as well as some joint issues.  She does not want to start HRT.  Reviewed with pt risks and that the decision is hers whether she should start this.  She is relieved by that as she thought she "had to".  Also reviewed with pt, due to younger age, would like to repeat [facility] and would plan to do this with her next AEX.  Pt comfortable with plan.  Second issue is that pt was recently contacted by [place] [facility] about follow up MRI that is due.  Pt was seen in February and was called back for follow up ultrasound on the right side.  When she went for follow-up, the tech was insistent follow up was due on the left.  Pt knew it was on the right.  Ultimately, radiologist had to come out and talk with pt and then realized pt was correct and imaging was due on the right.  She states she just had no confidence in the breast center.  Therefore, she feels repeating the MRI is just overkill as well.  Has hx of breast biopsies done at [facility] in same area where [place] is following pt (at least per her hx).  Would like recommendation about another facility for testing/screening.  GYNECOLOGIC HISTORY: No LMP recorded.  Patient has had an ablation.   Past Medical History  Diagnosis Date  . Allergy   . Asthma     HX  . Cancer ([place]) [id number]    ovarian  . GERD (gastroesophageal reflux disease)   . Breast cancer ([place]) 2007  . Abnormal Pap smear of cervix     h/o colposcopy/biopsy  . Hyperlipemia     on medication   . Diabetes mellitus without complication ([place]) [id number]     diagnosed by PCP, [name]     Past Surgical History  Procedure Laterality Date  . Appendectomy    . Cholecystectomy    . Breast lumpectomy      left breast  . Laparoscopic salpingo oopherectomy Left      with right tubal ligation  . Endometrial ablation      5 yrs ago. No menses since.    MEDS:  Reviewed in EPIC and UTD  ALLERGIES: Latex; Peanut-containing drug products; Sulfa antibiotics; Adhesive; Canagliflozin; Reglan; Ciprofloxacin; and Sulfur  Family History  Problem Relation Age of Onset  . Diabetes Mother   . Colon cancer Father   . Breast cancer Maternal Aunt   . Ovarian cancer Maternal Aunt   . Breast cancer Maternal Grandmother   . Ovarian cancer Cousin   . Colon cancer Cousin   . Melanoma Cousin   .  Cancer Other     breast and ovarian-maternal side    SH:  Married, non smoker  Review of Systems  All other systems reviewed and are negative.   PHYSICAL EXAMINATION:    BP 118/74 mmHg  Pulse 92  Resp 16  Ht 5\' 5"  (1.651 m)  Wt 159 lb 3.2 oz (72.213 kg)  BMI 26.49 kg/m2    General appearance: alert, cooperative and appears stated age No other physical exam performed.  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  Ultrasound showed simple cyst.  Plan: Pt will not start HRT at this time.  I am comfortable with plan and her decision Will see if [facility] will do screening 3D MMG and review all  prior images and make recommendation about follow up for pt.   ~15 minutes spent with patient >50% of time was in face to face discussion of above.

## 2016-02-20 ENCOUNTER — Encounter: Payer: Self-pay | Admitting: Obstetrics & Gynecology

## 2016-02-25 ENCOUNTER — Encounter: Payer: Self-pay | Admitting: Hematology & Oncology

## 2016-02-25 ENCOUNTER — Other Ambulatory Visit (HOSPITAL_BASED_OUTPATIENT_CLINIC_OR_DEPARTMENT_OTHER): Payer: BLUE CROSS/BLUE SHIELD

## 2016-02-25 ENCOUNTER — Ambulatory Visit (HOSPITAL_BASED_OUTPATIENT_CLINIC_OR_DEPARTMENT_OTHER): Payer: BLUE CROSS/BLUE SHIELD | Admitting: Hematology & Oncology

## 2016-02-25 VITALS — BP 113/71 | HR 78 | Temp 97.4°F | Resp 20 | Ht 65.0 in | Wt 156.0 lb

## 2016-02-25 DIAGNOSIS — D270 Benign neoplasm of right ovary: Secondary | ICD-10-CM

## 2016-02-25 DIAGNOSIS — Z8041 Family history of malignant neoplasm of ovary: Secondary | ICD-10-CM

## 2016-02-25 DIAGNOSIS — Z8543 Personal history of malignant neoplasm of ovary: Secondary | ICD-10-CM

## 2016-02-25 DIAGNOSIS — R197 Diarrhea, unspecified: Secondary | ICD-10-CM

## 2016-02-25 DIAGNOSIS — Z803 Family history of malignant neoplasm of breast: Secondary | ICD-10-CM

## 2016-02-25 DIAGNOSIS — R922 Inconclusive mammogram: Secondary | ICD-10-CM

## 2016-02-25 DIAGNOSIS — E119 Type 2 diabetes mellitus without complications: Secondary | ICD-10-CM

## 2016-02-25 DIAGNOSIS — D271 Benign neoplasm of left ovary: Secondary | ICD-10-CM

## 2016-02-25 LAB — CMP (CANCER CENTER ONLY)
ALBUMIN: 3.5 g/dL (ref 3.3–5.5)
ALK PHOS: 67 U/L (ref 26–84)
ALT: 22 U/L (ref 10–47)
AST: 20 U/L (ref 11–38)
BILIRUBIN TOTAL: 0.7 mg/dL (ref 0.20–1.60)
BUN, Bld: 13 mg/dL (ref 7–22)
CALCIUM: 9.1 mg/dL (ref 8.0–10.3)
CO2: 29 meq/L (ref 18–33)
CREATININE: 1.1 mg/dL (ref 0.6–1.2)
Chloride: 105 mEq/L (ref 98–108)
Glucose, Bld: 182 mg/dL — ABNORMAL HIGH (ref 73–118)
Potassium: 4.7 mEq/L (ref 3.3–4.7)
SODIUM: 139 meq/L (ref 128–145)
Total Protein: 6.7 g/dL (ref 6.4–8.1)

## 2016-02-25 LAB — CBC WITH DIFFERENTIAL (CANCER CENTER ONLY)
BASO#: 0 10*3/uL (ref 0.0–0.2)
BASO%: 0.3 % (ref 0.0–2.0)
EOS%: 2.4 % (ref 0.0–7.0)
Eosinophils Absolute: 0.1 10*3/uL (ref 0.0–0.5)
HCT: 39.9 % (ref 34.8–46.6)
HGB: 13.3 g/dL (ref 11.6–15.9)
LYMPH#: 1.8 10*3/uL (ref 0.9–3.3)
LYMPH%: 31.1 % (ref 14.0–48.0)
MCH: 30.4 pg (ref 26.0–34.0)
MCHC: 33.3 g/dL (ref 32.0–36.0)
MCV: 91 fL (ref 81–101)
MONO#: 0.2 10*3/uL (ref 0.1–0.9)
MONO%: 4.2 % (ref 0.0–13.0)
NEUT#: 3.5 10*3/uL (ref 1.5–6.5)
NEUT%: 62 % (ref 39.6–80.0)
Platelets: 269 10*3/uL (ref 145–400)
RBC: 4.38 10*6/uL (ref 3.70–5.32)
RDW: 12.9 % (ref 11.1–15.7)
WBC: 5.7 10*3/uL (ref 3.9–10.0)

## 2016-02-25 NOTE — Progress Notes (Signed)
Hematology and Oncology Follow Up Visit  Carolyn Wilson [id number] August 27, 1970 45 y.o. 02/25/2016   Principle Diagnosis:  History of Sertoli-Leydig tumor of the left ovary  Strong family history of breast and ovarian cancer - BRCA 1/2 and p53 negative  Current Therapy:   Observation    Interim History:  Carolyn Wilson is here today for a follow-up. She is doing well and has no complaints at this time. She is being followed by Dr. Hyacinth Meeker with gynecology now.   She was found have diabetes. I'm quite surprised by this. She is very active. She is on metformin.   Her son is a wrestler up at [employer]. He is in the heavyweight category. It sounds like he has a future with wrestling.   There is no problems with nausea or vomiting. She has had no change in bowel or bladder habits. She is now postmenopausal according to her gynecologist.   She is due for a mammogram this month.   Of note, she is BRCA negative.  Overall, her performance status is ECOG 0.   Medications:    Medication List       Accurate as of 02/25/16  9:31 AM. Always use your most recent med list.          aspirin 81 MG tablet Take 81 mg by mouth daily.   EPINEPHrine 0.3 mg/0.3 mL Soaj injection Commonly known as:  EPI-PEN Inject once into muscle for any allergic reaction to peanut exposure   glucose blood test strip Commonly known as:  BAYER CONTOUR NEXT TEST 1 each by Other route as needed for other. Check blood sugar twice a day before meals and as needed. E11.9   metFORMIN 500 MG 24 hr tablet Commonly known as:  GLUCOPHAGE XR Take 1 tablet (500 mg total) by mouth daily with breakfast.   MULTI VITAMIN DAILY Tabs   omeprazole 20 MG capsule Commonly known as:  PRILOSEC Take 20 mg by mouth daily.   ondansetron 4 MG tablet Commonly known as:  ZOFRAN Take 4 mg by mouth every 8 (eight) hours as needed for nausea or vomiting.       Allergies:  Allergies  Allergen Reactions  . Latex  Rash    Causes blisters  . Peanut-Containing Drug Products Anaphylaxis  . Sulfa Antibiotics Rash and Shortness Of Breath    Shortness of breath, rash  . Adhesive [Tape]   . Canagliflozin Other (See Comments)  . Reglan [Metoclopramide] Tinitus  . Ciprofloxacin Rash  . Sulfur Rash    Past Medical History, Surgical history, Social history, and Family History were reviewed and updated.  Review of Systems: All other 10 point review of systems is negative.   Physical Exam:  height is 5\' 5"  (1.651 m) and weight is 156 lb (70.8 kg). Her oral temperature is 97.4 F (36.3 C). Her blood pressure is 113/71 and her pulse is 78. Her respiration is 20.   Wt Readings from Last 3 Encounters:  02/25/16 156 lb (70.8 kg)  02/07/16 159 lb 3.2 oz (72.2 kg)  12/27/15 159 lb (72.1 kg)    Well-developed and well-nourished white female in no obvious distress. Head and neck exam shows no ocular or oral lesions. She has no palpable cervical or supraclavicular lymph nodes. Lungs are clear bilaterally. Cardiac exam regular rate and rhythm with no murmurs, rubs or bruits. Breast exam shows left breast with no masses, edema or erythema. There is no left axillary adenopathy. Right breast shows some slight  firmness about the aerial at the 10:00 position. No distinct masses noted. There is no discharge noted. There is no right axillary adenopathy. Abdomen is soft. She has good bowel sounds. There is no fluid wave. There is no palpable liver or spleen tip. Back exam shows no tenderness over the spine, ribs or hips. Extremities shows no clubbing, cyanosis or edema. She has good range was rejoice. Neurological exam shows no focal neurological deficits.  .   Lab Results  Component Value Date   WBC 5.7 02/25/2016   HGB 13.3 02/25/2016   HCT 39.9 02/25/2016   MCV 91 02/25/2016   PLT 269 02/25/2016   No results found for: FERRITIN, IRON, TIBC, UIBC, IRONPCTSAT Lab Results  Component Value Date   RBC 4.38 02/25/2016     No results found for: KPAFRELGTCHN, LAMBDASER, KAPLAMBRATIO No results found for: IGGSERUM, IGA, IGMSERUM No results found for: Odetta Pink, SPEI   Chemistry      Component Value Date/Time   NA 139 02/25/2016 0909   K 4.7 02/25/2016 0909   CL 105 02/25/2016 0909   CO2 29 02/25/2016 0909   BUN 13 02/25/2016 0909   CREATININE 1.1 02/25/2016 0909      Component Value Date/Time   CALCIUM 9.1 02/25/2016 0909   ALKPHOS 67 02/25/2016 0909   AST 20 02/25/2016 0909   ALT 22 02/25/2016 0909   BILITOT 0.70 02/25/2016 0909     Impression and Plan: Carolyn Wilson is a 45 yo post-menopausal female with a 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  She has a strong family history of breast and ovarian cancer.  She is scheduled to have a mammogram on August 28th.   She is BRCA negative. As such, I'm not sure we will have to go with MRIs. She does have dense breasts. I'll like to hope that the new 3-D mammograms will be able to detect any problem.   On her genetic testing, she was found to be positive for BARD1, which does not have any clinical significance.  I would like to see her back in 6 months.  Volanda Napoleon, MD 8/7/20179:31 AM

## 2016-02-26 LAB — VITAMIN D 25 HYDROXY (VIT D DEFICIENCY, FRACTURES): VIT D 25 HYDROXY: 39.1 ng/mL (ref 30.0–100.0)

## 2016-03-27 ENCOUNTER — Encounter: Payer: Self-pay | Admitting: Obstetrics & Gynecology

## 2016-03-28 ENCOUNTER — Encounter: Payer: Self-pay | Admitting: Hematology & Oncology

## 2016-04-01 ENCOUNTER — Ambulatory Visit: Payer: BLUE CROSS/BLUE SHIELD | Admitting: Obstetrics & Gynecology

## 2016-04-01 ENCOUNTER — Encounter: Payer: Self-pay | Admitting: Obstetrics & Gynecology

## 2016-04-02 ENCOUNTER — Telehealth: Payer: Self-pay | Admitting: Obstetrics & Gynecology

## 2016-04-02 NOTE — Telephone Encounter (Signed)
Spoke with Janett Billow at [facility]. Janett Billow is calling to report that the patient's bilateral mammogram from 03/17/2016 is normal, but the radiologist has reviewed the patient's prior bilateral breast MRI that was performed at [facility] on 08/22/2015 and is agreeable with the recommendation for a 6 month follow up bilateral breast MRI. Janett Billow has contacted the patient and discussed recommendations with her. Per Janett Billow the patient is agreeable to proceed with 6 month follow up bilateral breast MRI at this time. Janett Billow has discussed with patient recommendation to have this performed at [facility] so that the same machine is used for imaging comparison. The patient is agreeable per Janett Billow. Bennie Pierini I will review this with Dr.Miller so that appropriate orders may be placed and imaging can be scheduled. Janett Billow is agreeable.  Dr.Miller, okay to proceed with ordering bilateral breast MRI with and without contrast?

## 2016-04-02 NOTE — Telephone Encounter (Signed)
Spoke with patient regarding my chart message seen below. Patient reports she was advised by [facility] that it is recommended that she proceed with having an MRI at this time. Patient is agreeable and asking about the process for scheduling. Advised patient I will review with Dr.Miller and order will be placed for imaging. This will then be pre-approved with her insurance company and we will notify the imaging location once this has been performed. At that time the imaging location will contact her directly to schedule her appointment. Patient has concerns due to prior experience she had with MRI imaging performed at [facility] in 08/2015.  Requesting Dr.Miller's recommendation regarding imaging location due to her concerns. Advised I will speak with Dr.Miller and return call with further recommendations. Patient is agreeable.  Non-Urgent Medical Question  Message [id number]  From Zayneb Parilla Maiello To Megan Salon, MD Sent 04/01/2016 10:44 AM  I spoke with a tech today at [facility], they said the radiologist had left mammogram open and was not wanting to override the order for the MRI, I spoke with my insurance company this morning and they said it would have to be done as a pre-approval? Any suggestions? If I do the MRI do I let [facility] reschedule they keep sending me letters... Sorry for all the questions.   Responsible Party   Pool - [facility] Clinical Pool No one has taken responsibility for this message.  No actions have been taken on this message.

## 2016-04-02 NOTE — Telephone Encounter (Signed)
Spoke with patient. Please see telephone encounter dated 04/02/2016.

## 2016-04-02 NOTE — Telephone Encounter (Signed)
Janett Billow at [facility] is asking to talk with Dr.Miller's nurse regarding a Breast MRI on this patient.

## 2016-04-05 NOTE — Telephone Encounter (Signed)
She did have a negative experience as she reports the area of concern was on one side and when the imaging was to be done they kept trying to image the opposite side.  She had to advocate for herself in a way that she felt was more than should be necessary.  So, we could schedule the MRI with [facility] on [address].  That's my suggestion.

## 2016-04-08 NOTE — Telephone Encounter (Signed)
Patient returned call to office. Patient states that she received her flu shot yesterday and was asked if she had recently had an MRI with or without contrast. Patient is asking if this is contraindicated for her to have her MRI performed tomorrow. Advised I am unaware of this interfering with MRI imaging. Advised I will review this with Dr.Miller and return call with further recommendations. Patient is agreeable.

## 2016-04-08 NOTE — Telephone Encounter (Signed)
Spoke with [facility] on [address] who state they do not perform breast MRIs at their location. [facility] in [place] does perform breast MRIs. Call to patient to see how she would like to proceed with scheduling. Patient reports she would like to be seen with [facility] in [place]. States she is available any day and time in the morning. Advised I will contact [facility] regarding scheduling and return call with appointment date and time. Patient is agreeable.  Call to [facility]. Bilateral breast MRI w wo contrast scheduled for tomorrow 04/09/2016 at 9 am with 8:30 am arrival. Patient has been notified of appointment date and time and is agreeable.   Paper order for bilateral breast MRI w wo contrast to Dr.Miller's desk for review and signature to be faxed to [facility] at [phone].

## 2016-04-08 NOTE — Telephone Encounter (Signed)
PA for MRI bilateral breast w wo contrast requires peer to peer review through [facility]. Contact number is [phone]. There is no reference number to the call per Wandra Feinstein representative. Patient's name and DOB will need to be referenced.   Call to [facility] appointment rescheduled to 04/17/2016 at 9 am with 8:30 am arrival to ensure enough time for peer to peer review. Signed order has been faxed to [facility] with cover sheet and confirmation to [phone].  Call to patient advised PA requires further review and appointment has been rescheduled to ensure enough time for completion. Patient is agreeable to new appointment date and time and verbalizes understanding.

## 2016-04-08 NOTE — Telephone Encounter (Signed)
Spoke with [facility]. Per imaging center no contraindication for bilateral breast MRI w wo contrast following flu vaccination. Patient has been notified.

## 2016-04-10 NOTE — Telephone Encounter (Signed)
Her MRI was denied.  I do not know what else to do.  They will cover her MRI in February but not a six month repeat, follow up MRI.  Please let her know.  We can cancel the MRI if she desires.    I am also happy to let her have follow up with a breast surgeon, as well, for consultation.

## 2016-04-11 ENCOUNTER — Other Ambulatory Visit: Payer: Self-pay | Admitting: Family

## 2016-04-11 DIAGNOSIS — Z803 Family history of malignant neoplasm of breast: Secondary | ICD-10-CM

## 2016-04-11 DIAGNOSIS — N6001 Solitary cyst of right breast: Secondary | ICD-10-CM

## 2016-04-11 NOTE — Telephone Encounter (Signed)
Spoke with patient. Patient has spoken with [place] and her insurance company and bilateral breast MRI w wo contrast will be $3,000 without insurance PA and having to meet deductible. Patient has decided she would like to cancel her MRI that is scheduled for 04/17/2016 and will call [facility] to cancel this appointment. Patient would like to proceed with consultation with a breast surgeon at this time. Advised I will let Dr.Miller know so that referral can be placed for consultation. Patient is aware our referrals coordinator will work to get this appointment scheduled and she will be notified of appointment date and time.

## 2016-04-11 NOTE — Telephone Encounter (Signed)
Spoke with patient. Advised patient of message as seen below from [place]. Patient verbalizes understanding. Patient would like to speak with [place] and her insurance company before canceling her appointment. She is aware she may cancel her appointment with [facility] directly if she desire after reviewing cost. If she chooses to cancel her appointment she will contact the office to let Dr.Miller know.

## 2016-04-14 ENCOUNTER — Other Ambulatory Visit: Payer: Self-pay | Admitting: Obstetrics & Gynecology

## 2016-04-14 DIAGNOSIS — Z9189 Other specified personal risk factors, not elsewhere classified: Secondary | ICD-10-CM

## 2016-04-14 DIAGNOSIS — Z803 Family history of malignant neoplasm of breast: Secondary | ICD-10-CM

## 2016-04-14 NOTE — Telephone Encounter (Signed)
Referral has been place for Dr. Serita Grammes.  Ok to close encounter.

## 2016-04-22 ENCOUNTER — Encounter: Payer: Self-pay | Admitting: Medical

## 2016-04-28 ENCOUNTER — Encounter: Payer: Self-pay | Admitting: Obstetrics & Gynecology

## 2016-04-28 ENCOUNTER — Ambulatory Visit (INDEPENDENT_AMBULATORY_CARE_PROVIDER_SITE_OTHER): Payer: BLUE CROSS/BLUE SHIELD | Admitting: Obstetrics & Gynecology

## 2016-04-28 VITALS — BP 120/80 | HR 96 | Resp 16 | Ht 64.0 in | Wt 154.0 lb

## 2016-04-28 DIAGNOSIS — Z01419 Encounter for gynecological examination (general) (routine) without abnormal findings: Secondary | ICD-10-CM

## 2016-04-28 NOTE — Progress Notes (Signed)
45 y.o. [id number] MarriedCaucasianF here for annual exam.  Doing well.  Denies vaginal bleeding.  Pt and I discussed recent MRI denial.  Pt feels this is overkill and doesn't really want to do this if not covered by insurance.  I did the peer to peer review and it will be covered again for yearly MRI.  Pt comfortable with waiting until then for MRI.  Son is junior in TEFL teacher for wrestling event for the [employer].  Triple major on scholarship.  Will graduate 1 1/2 years early.  Patient's last menstrual period was 07/22/2007 (approximate).          Sexually active: Yes.    The current method of family planning is tubal ligation.    Exercising: Yes.    Cardio, weights Smoker:  yes  Health Maintenance: Pap:  11/09/14 Neg  History of abnormal Pap:  yes MMG:  09/12/15 MR Breast Bilateral w wo Contrast BIRADS4A: Low suspicious.  Colonoscopy:  None BMD:   None TDaP:  10/2014  Pneumonia vaccine(s):  05/2012  Zostavax:   No Hep C testing: No Screening Labs: PCP, Urine today: PCP   reports that she has never smoked. She has never used smokeless tobacco. She reports that she drinks about 1.2 oz of alcohol per week . She reports that she does not use drugs.  Past Medical History:  Diagnosis Date  . Abnormal Pap smear of cervix    h/o colposcopy/biopsy  . Allergy   . Asthma    HX  . Breast cancer ([place]) 2007  . Cancer ([place]) [id number]   ovarian  . Diabetes mellitus without complication ([place]) [id number]    diagnosed by PCP, [name]   . GERD (gastroesophageal reflux disease)   . Hyperlipemia    on medication     Past Surgical History:  Procedure Laterality Date  . APPENDECTOMY    . BREAST LUMPECTOMY     left breast  . CHOLECYSTECTOMY    . ENDOMETRIAL ABLATION     5 yrs ago. No menses since.  [name] LAPAROSCOPIC SALPINGO OOPHERECTOMY Left     with right tubal ligation    Current Outpatient Prescriptions  Medication Sig Dispense Refill  . aspirin 81 MG tablet Take 81 mg by mouth  daily.    [name] EPINEPHrine 0.3 mg/0.3 mL IJ SOAJ injection Inject once into muscle for any allergic reaction to peanut exposure 1 Device 1  . glucose blood (BAYER CONTOUR NEXT TEST) test strip 1 each by Other route as needed for other. Check blood sugar twice a day before meals and as needed. E11.9 100 each 10  . metFORMIN (GLUCOPHAGE XR) 500 MG 24 hr tablet Take 1 tablet (500 mg total) by mouth daily with breakfast. 30 tablet 3  . Multiple Vitamin (MULTI VITAMIN DAILY) TABS     . omeprazole (PRILOSEC) 20 MG capsule Take 20 mg by mouth daily.    . ondansetron (ZOFRAN) 4 MG tablet Take 4 mg by mouth every 8 (eight) hours as needed for nausea or vomiting.     No current facility-administered medications for this visit.     Family History  Problem Relation Age of Onset  . Diabetes Mother   . Colon cancer Father   . Breast cancer Maternal Aunt   . Ovarian cancer Maternal Aunt   . Ovarian cancer Cousin   . Colon cancer Cousin   . Melanoma Cousin   . Breast cancer Maternal Grandmother   . Cancer Other  breast and ovarian-maternal side    ROS:  Pertinent items are noted in HPI.  Otherwise, a comprehensive ROS was negative.  Exam:   BP 120/80 (BP Location: Right Arm, Patient Position: Sitting, Cuff Size: Normal)   Pulse 96   Resp 16   Ht 5\' 4"  (1.626 m)   Wt 154 lb (69.9 kg)   LMP 07/22/2007 (Approximate)   BMI 26.43 kg/m   Weight change:-26#    Height: 5\' 4"  (162.6 cm)  Ht Readings from Last 3 Encounters:  04/28/16 5\' 4"  (1.626 m)  02/25/16 5\' 5"  (1.651 m)  02/07/16 5\' 5"  (1.651 m)    General appearance: alert, cooperative and appears stated age Head: Normocephalic, without obvious abnormality, atraumatic Neck: no adenopathy, supple, symmetrical, trachea midline and thyroid normal to inspection and palpation Lungs: clear to auscultation bilaterally Breasts: normal appearance, no masses or tenderness Heart: regular rate and rhythm Abdomen: soft, non-tender; bowel sounds  normal; no masses,  no organomegaly Extremities: extremities normal, atraumatic, no cyanosis or edema Skin: Skin color, texture, turgor normal. No rashes or lesions Lymph nodes: Cervical, supraclavicular, and axillary nodes normal. No abnormal inguinal nodes palpated Neurologic: Grossly normal   Pelvic: External genitalia:  no lesions              Urethra:  normal appearing urethra with no masses, tenderness or lesions              Bartholins and Skenes: normal                 Vagina: normal appearing vagina with normal color and discharge, no lesions              Cervix: no lesions              Pap taken: No. Bimanual Exam:  Uterus:  normal size, contour, position, consistency, mobility, non-tender              Adnexa: normal adnexa and no mass, fullness, tenderness               Rectovaginal: Confirms               Anus:  normal sphincter tone, no lesions  Chaperone was present for exam.  A:    Well Woman with normal exam H/O endometrial ablation 2007, now with some spotting, DUB H/O sertoli Leydig tumor diagnosed age 11, s/p LSO H/O periductal stromal tumor of breast Strong family hx of ovarian and breast cancer "Real" peanut allergy  P:         Mammogram yearly that is done alternately with MRI.  Recent follow up diagnostic imaging with MRI was declined by insurance.  Pt does not want to pay out of pocket for this. Pap 2016.  Pt had neg HR HPV testing at [facility].  Will repeat pap and HR HPV 1 year.  Return annually or prn

## 2016-05-27 ENCOUNTER — Telehealth: Payer: Self-pay | Admitting: Medical

## 2016-05-27 NOTE — Telephone Encounter (Signed)
Patient scheduled with nurse for 06/03/2016

## 2016-05-27 NOTE — Telephone Encounter (Signed)
ok 

## 2016-05-27 NOTE — Telephone Encounter (Signed)
Relation to [id number] Call back number: [phone]    Reason for call:  Patient would like to schedule TB nurse visit, due to new hire with [employer], please advise

## 2016-05-27 NOTE — Telephone Encounter (Signed)
Ok to schedule as long as PPD hx is negative?

## 2016-06-03 ENCOUNTER — Ambulatory Visit (INDEPENDENT_AMBULATORY_CARE_PROVIDER_SITE_OTHER): Payer: BLUE CROSS/BLUE SHIELD

## 2016-06-03 ENCOUNTER — Telehealth: Payer: Self-pay | Admitting: Medical

## 2016-06-03 DIAGNOSIS — Z111 Encounter for screening for respiratory tuberculosis: Secondary | ICD-10-CM | POA: Diagnosis not present

## 2016-06-03 NOTE — Telephone Encounter (Signed)
I spoke with the patient and let her know that when she came in to the office on 06/05/16 to have her TB skin test read that Percell Miller would listen to her heart and lungs at that visit so we could complete the form that was dropped off on 06/03/16. Patient voices understanding.

## 2016-06-03 NOTE — Progress Notes (Signed)
Pre visit review using our clinic tool,if applicable. No additional management support is needed unless otherwise documented below in the visit note.   Patient in for PPD. Given Left forearm.

## 2016-06-03 NOTE — Telephone Encounter (Signed)
When pt is in for ppd read. Will you get me and I will do a quick physical. Listen to lungs, heart, check gross hearing and if you or other could check vision. I don't feel comfortable pulling info from gyn exam. But my exam will be very quick. So please hold form.

## 2016-06-05 LAB — TB SKIN TEST
Induration: 0 mm
TB Skin Test: NEGATIVE

## 2016-07-01 ENCOUNTER — Encounter: Payer: Self-pay | Admitting: Medical

## 2016-07-02 MED ORDER — METFORMIN HCL ER 500 MG PO TB24: 500.0000 mg | ORAL_TABLET | Freq: Every day | ORAL | 1 refills | Status: DC

## 2016-08-04 ENCOUNTER — Ambulatory Visit (INDEPENDENT_AMBULATORY_CARE_PROVIDER_SITE_OTHER): Payer: BC Managed Care – PPO | Admitting: Medical

## 2016-08-04 ENCOUNTER — Encounter: Payer: Self-pay | Admitting: Medical

## 2016-08-04 VITALS — HR 89 | Temp 98.0°F | Resp 16 | Ht 64.0 in | Wt 160.0 lb

## 2016-08-04 DIAGNOSIS — Z789 Other specified health status: Secondary | ICD-10-CM | POA: Diagnosis not present

## 2016-08-04 DIAGNOSIS — E119 Type 2 diabetes mellitus without complications: Secondary | ICD-10-CM

## 2016-08-04 DIAGNOSIS — Z0000 Encounter for general adult medical examination without abnormal findings: Secondary | ICD-10-CM | POA: Diagnosis not present

## 2016-08-04 DIAGNOSIS — Z1283 Encounter for screening for malignant neoplasm of skin: Secondary | ICD-10-CM | POA: Diagnosis not present

## 2016-08-04 LAB — CBC WITH DIFFERENTIAL/PLATELET
BASOS ABS: 0 10*3/uL (ref 0.0–0.1)
BASOS PCT: 0.4 % (ref 0.0–3.0)
EOS ABS: 0.2 10*3/uL (ref 0.0–0.7)
Eosinophils Relative: 2.7 % (ref 0.0–5.0)
HCT: 40.4 % (ref 36.0–46.0)
Hemoglobin: 13.7 g/dL (ref 12.0–15.0)
LYMPHS PCT: 35.1 % (ref 12.0–46.0)
Lymphs Abs: 2.4 10*3/uL (ref 0.7–4.0)
MCHC: 33.8 g/dL (ref 30.0–36.0)
MCV: 87.9 fl (ref 78.0–100.0)
MONO ABS: 0.3 10*3/uL (ref 0.1–1.0)
Monocytes Relative: 4.2 % (ref 3.0–12.0)
Neutro Abs: 3.9 10*3/uL (ref 1.4–7.7)
Neutrophils Relative %: 57.6 % (ref 43.0–77.0)
Platelets: 301 10*3/uL (ref 150.0–400.0)
RBC: 4.59 Mil/uL (ref 3.87–5.11)
RDW: 13.6 % (ref 11.5–15.5)
WBC: 6.7 10*3/uL (ref 4.0–10.5)

## 2016-08-04 LAB — COMPREHENSIVE METABOLIC PANEL
ALBUMIN: 4.2 g/dL (ref 3.5–5.2)
ALT: 14 U/L (ref 0–35)
AST: 19 U/L (ref 0–37)
Alkaline Phosphatase: 53 U/L (ref 39–117)
BILIRUBIN TOTAL: 0.4 mg/dL (ref 0.2–1.2)
BUN: 15 mg/dL (ref 6–23)
CHLORIDE: 104 meq/L (ref 96–112)
CO2: 28 meq/L (ref 19–32)
CREATININE: 0.87 mg/dL (ref 0.40–1.20)
Calcium: 9.7 mg/dL (ref 8.4–10.5)
GFR: 74.79 mL/min (ref >60.00)
Glucose, Bld: 104 mg/dL — ABNORMAL HIGH (ref 70–99)
Potassium: 4.2 mEq/L (ref 3.5–5.1)
SODIUM: 138 meq/L (ref 135–145)
Total Protein: 7 g/dL (ref 6.0–8.3)

## 2016-08-04 LAB — LIPID PANEL
CHOLESTEROL: 218 mg/dL — AB (ref 0–200)
HDL: 48 mg/dL (ref >39.00)
LDL CALC: 146 mg/dL — AB (ref 0–99)
NONHDL: 170.45
Total CHOL/HDL Ratio: 5
Triglycerides: 122 mg/dL (ref 0.0–149.0)
VLDL: 24.4 mg/dL (ref 0.0–40.0)

## 2016-08-04 LAB — HM DIABETES EYE EXAM

## 2016-08-04 LAB — MICROALBUMIN / CREATININE URINE RATIO
Creatinine,U: 185.3 mg/dL
MICROALB UR: 0.8 mg/dL (ref 0.0–1.9)
Microalb Creat Ratio: 0.4 mg/g (ref 0.0–30.0)

## 2016-08-04 LAB — HEMOGLOBIN A1C: HEMOGLOBIN A1C: 6.5 % (ref 4.6–6.5)

## 2016-08-04 LAB — TSH: TSH: 1.52 u[IU]/mL (ref 0.35–4.50)

## 2016-08-04 NOTE — Progress Notes (Signed)
Subjective:    Patient ID: Carolyn Wilson, female    DOB: [birth year], 46 y.o.   MRN: [id number]  HPI   I have reviewed pt PMH, PSH, FH, Social History and Surgical History.  Pt eating well recently. No sodas. Avoid processed foods with sugars. Exercising 4 days a week.  Pt is fasting.  Pt last paps mear was normal and in 2016.  Pt states her last mammogram was normal in end. But she had various studies done. Some confusion on read. [facility] read most recent and compared to prior and she stated nothing new. Pt not feeling any lumps or area of concern as well.   Pt bs in am usually 116-124. Pt on metformin xr. She used to be on invokana but stopped.(no side effect and reasonably priced at the time.   Pt has some gerd. She uses nexium and rare zofran.  Pt also mentions today may have never had chicken pox. She is not sure. So will do labs today.     Review of Systems  Constitutional: Negative for chills, fatigue and fever.  HENT: Negative.   Respiratory: Negative for cough, chest tightness, shortness of breath and wheezing.   Cardiovascular: Negative for palpitations.  Gastrointestinal: Negative.   Musculoskeletal: Negative for back pain.  Skin: Negative for rash.  Neurological: Negative for dizziness and headaches.  Hematological: Negative for adenopathy. Does not bruise/bleed easily.  Psychiatric/Behavioral: Negative for agitation, behavioral problems, decreased concentration, dysphoric mood, self-injury and suicidal ideas. The patient is not nervous/anxious.     Past Medical History:  Diagnosis Date  . Abnormal Pap smear of cervix    h/o colposcopy/biopsy  . Allergy   . Asthma    HX  . Breast cancer ([place]) 2007  . Cancer ([place]) [id number]   ovarian  . Diabetes mellitus without complication ([place]) [id number]    diagnosed by PCP, [name]   . GERD (gastroesophageal reflux disease)   . Hyperlipemia    on medication      Social History   Social  History  . Marital status: Married    Spouse name: N/A  . Number of children: N/A  . Years of education: N/A   Occupational History  . Not on file.   Social History Main Topics  . Smoking status: Never Smoker  . Smokeless tobacco: Never Used     Comment: NEVER USED TOBACCO  . Alcohol use 1.2 oz/week    2 Standard drinks or equivalent per week  . Drug use: No  . Sexual activity: Yes    Partners: Male    Birth control/ protection: Surgical   Other Topics Concern  . Not on file   Social History Narrative  . No narrative on file    Past Surgical History:  Procedure Laterality Date  . APPENDECTOMY    . BREAST LUMPECTOMY     left breast  . CHOLECYSTECTOMY    . ENDOMETRIAL ABLATION     5 yrs ago. No menses since.  [name] LAPAROSCOPIC SALPINGO OOPHERECTOMY Left     with right tubal ligation    Family History  Problem Relation Age of Onset  . Diabetes Mother   . Colon cancer Father   . Breast cancer Maternal Aunt   . Ovarian cancer Maternal Aunt   . Ovarian cancer Cousin   . Colon cancer Cousin   . Melanoma Cousin   . Breast cancer Maternal Grandmother   . Cancer Other     breast and ovarian-maternal  side    Allergies  Allergen Reactions  . Latex Rash    Causes blisters  . Peanut-Containing Drug Products Anaphylaxis  . Sulfa Antibiotics Rash and Shortness Of Breath    Shortness of breath, rash Shortness of breath, rash  . Adhesive [Tape]   . Canagliflozin Other (See Comments)  . Reglan [Metoclopramide] Tinitus  . Ciprofloxacin Rash  . Sulfur Rash    Current Outpatient Prescriptions on File Prior to Visit  Medication Sig Dispense Refill  . aspirin 81 MG tablet Take 81 mg by mouth daily.    [name] EPINEPHrine 0.3 mg/0.3 mL IJ SOAJ injection Inject once into muscle for any allergic reaction to peanut exposure 1 Device 1  . glucose blood (BAYER CONTOUR NEXT TEST) test strip 1 each by Other route as needed for other. Check blood sugar twice a day before meals and as  needed. E11.9 100 each 10  . metFORMIN (GLUCOPHAGE XR) 500 MG 24 hr tablet Take 1 tablet (500 mg total) by mouth daily with breakfast. 30 tablet 1  . Multiple Vitamin (MULTI VITAMIN DAILY) TABS     . ondansetron (ZOFRAN) 4 MG tablet Take 4 mg by mouth every 8 (eight) hours as needed for nausea or vomiting.     No current facility-administered medications on file prior to visit.     Pulse 89   Temp 98 F (36.7 C) (Oral)   Resp 16   Ht 5\' 4"  (1.626 m)   Wt 160 lb (72.6 kg)   SpO2 100%   BMI 27.46 kg/m       Objective:   Physical Exam  General Mental Status- Alert. General Appearance- Not in acute distress.   Skin  Scattered small moles on her back. One moderate size and mild irregular. Neck Carotid Arteries- Normal color. Moisture- Normal Moisture. No carotid bruits. No JVD.  Chest and Lung Exam Auscultation: Breath Sounds:-Normal.  Cardiovascular Auscultation:Rythm- Regular. Murmurs & Other Heart Sounds:Auscultation of the heart reveals- No Murmurs.  Abdomen Inspection:-Inspeection Normal. Palpation/Percussion:Note:No mass. Palpation and Percussion of the abdomen reveal- Non Tender, Non Distended + BS, no rebound or guarding.    Neurologic Cranial Nerve exam:- CN III-XII intact(No nystagmus), symmetric smile. Drift Test:- No drift. Romberg Exam:- Negative.  Heal to Toe Gait exam:-Normal. Finger to Nose:- Normal/Intact Strength:- 5/5 equal and symmetric strength both upper and lower extremities.      Assessment & Plan:  For you wellness exam today I have ordered cbc, cmp, tsh, lipid panel, and  ua.  Vaccines up to date  Will get a1c today.   When you get your diabetic eye exam done please get them to send [place] notes.  Recommend exercise and healthy diet.  We will let you know lab results as they come in.  Varicella igg antibody testing today.  Follow up date appointment will be determined after lab review.  Janisse Ghan, Percell Miller, PA-C

## 2016-08-04 NOTE — Progress Notes (Signed)
Pre visit review using our clinic review tool, if applicable. No additional management support is needed unless otherwise documented below in the visit note/SLS  

## 2016-08-04 NOTE — Patient Instructions (Addendum)
For you wellness exam today I have ordered cbc, cmp, tsh, lipid panel, and  ua.  Vaccines up to date  Will get a1c today.   When you get your diabetic eye exam done please get them to send [place] notes.  Recommend exercise and healthy diet.  We will let you know lab results as they come in.  Varicella igg antibody testing today.  Follow up date appointment will be determined after lab review.   Preventive Care 40-64 Years, Female Preventive care refers to lifestyle choices and visits with your health care provider that can promote health and wellness. What does preventive care include?  A yearly physical exam. This is also called an annual well check.  Dental exams once or twice a year.  Routine eye exams. Ask your health care provider how often you should have your eyes checked.  Personal lifestyle choices, including:  Daily care of your teeth and gums.  Regular physical activity.  Eating a healthy diet.  Avoiding tobacco and drug use.  Limiting alcohol use.  Practicing safe sex.  Taking low-dose aspirin daily starting at age 28.  Taking vitamin and mineral supplements as recommended by your health care provider. What happens during an annual well check? The services and screenings done by your health care provider during your annual well check will depend on your age, overall health, lifestyle risk factors, and family history of disease. Counseling  Your health care provider may ask you questions about your:  Alcohol use.  Tobacco use.  Drug use.  Emotional well-being.  Home and relationship well-being.  Sexual activity.  Eating habits.  Work and work Statistician.  Method of birth control.  Menstrual cycle.  Pregnancy history. Screening  You may have the following tests or measurements:  Height, weight, and BMI.  Blood pressure.  Lipid and cholesterol levels. These may be checked every 5 years, or more frequently if you are over 47 years  old.  Skin check.  Lung cancer screening. You may have this screening every year starting at age 68 if you have a 30-pack-year history of smoking and currently smoke or have quit within the past 15 years.  Fecal occult blood test (FOBT) of the stool. You may have this test every year starting at age 41.  Flexible sigmoidoscopy or colonoscopy. You may have a sigmoidoscopy every 5 years or a colonoscopy every 10 years starting at age 17.  Hepatitis C blood test.  Hepatitis B blood test.  Sexually transmitted disease (STD) testing.  Diabetes screening. This is done by checking your blood sugar (glucose) after you have not eaten for a while (fasting). You may have this done every 1-3 years.  Mammogram. This may be done every 1-2 years. Talk to your health care provider about when you should start having regular mammograms. This may depend on whether you have a family history of breast cancer.  BRCA-related cancer screening. This may be done if you have a family history of breast, ovarian, tubal, or peritoneal cancers.  Pelvic exam and Pap test. This may be done every 3 years starting at age 64. Starting at age 22, this may be done every 5 years if you have a Pap test in combination with an HPV test.  Bone density scan. This is done to screen for osteoporosis. You may have this scan if you are at high risk for osteoporosis. Discuss your test results, treatment options, and if necessary, the need for more tests with your health care provider. Vaccines  Your health care provider may recommend certain vaccines, such as:  Influenza vaccine. This is recommended every year.  Tetanus, diphtheria, and acellular pertussis (Tdap, Td) vaccine. You may need a Td booster every 10 years.  Varicella vaccine. You may need this if you have not been vaccinated.  Zoster vaccine. You may need this after age 58.  Measles, mumps, and rubella (MMR) vaccine. You may need at least one dose of MMR if you were  born in 1957 or later. You may also need a second dose.  Pneumococcal 13-valent conjugate (PCV13) vaccine. You may need this if you have certain conditions and were not previously vaccinated.  Pneumococcal polysaccharide (PPSV23) vaccine. You may need one or two doses if you smoke cigarettes or if you have certain conditions.  Meningococcal vaccine. You may need this if you have certain conditions.  Hepatitis A vaccine. You may need this if you have certain conditions or if you travel or work in places where you may be exposed to hepatitis A.  Hepatitis B vaccine. You may need this if you have certain conditions or if you travel or work in places where you may be exposed to hepatitis B.  Haemophilus influenzae type b (Hib) vaccine. You may need this if you have certain conditions. Talk to your health care provider about which screenings and vaccines you need and how often you need them. This information is not intended to replace advice given to you by your health care provider. Make sure you discuss any questions you have with your health care provider. Document Released: 08/03/2015 Document Revised: 03/26/2016 Document Reviewed: 05/08/2015 Elsevier Interactive Patient Education  2017 [employer].

## 2016-08-05 LAB — VARICELLA ZOSTER ANTIBODY, IGG: Varicella IgG: 175.2 Index — ABNORMAL HIGH (ref <135.00)

## 2016-08-20 ENCOUNTER — Telehealth: Payer: Self-pay | Admitting: Medical

## 2016-08-20 NOTE — Telephone Encounter (Signed)
°  Relation to pt: self  Call back number: [phone]  Pharmacy: [facility]   Reason for call:  Due to insurance change please remove [place], [place] and send all refill request 90 day supply to [facility]   Patient requesting a 90 day supply esomeprazole (NEXIUM) 20 MG capsule please send to mail order.

## 2016-08-22 MED ORDER — ESOMEPRAZOLE MAGNESIUM 20 MG PO CPDR: 20.0000 mg | DELAYED_RELEASE_CAPSULE | Freq: Every day | ORAL | 1 refills | Status: DC

## 2016-08-22 NOTE — Telephone Encounter (Signed)
Rx request to pharmacy/SLS  

## 2016-08-25 ENCOUNTER — Ambulatory Visit (HOSPITAL_BASED_OUTPATIENT_CLINIC_OR_DEPARTMENT_OTHER): Payer: BC Managed Care – PPO | Admitting: Hematology & Oncology

## 2016-08-25 ENCOUNTER — Encounter: Payer: Self-pay | Admitting: Medical

## 2016-08-25 ENCOUNTER — Other Ambulatory Visit (HOSPITAL_BASED_OUTPATIENT_CLINIC_OR_DEPARTMENT_OTHER): Payer: BC Managed Care – PPO

## 2016-08-25 VITALS — BP 119/74 | HR 90 | Temp 97.4°F | Wt 155.0 lb

## 2016-08-25 DIAGNOSIS — Z803 Family history of malignant neoplasm of breast: Secondary | ICD-10-CM | POA: Diagnosis not present

## 2016-08-25 DIAGNOSIS — R197 Diarrhea, unspecified: Secondary | ICD-10-CM

## 2016-08-25 DIAGNOSIS — D270 Benign neoplasm of right ovary: Secondary | ICD-10-CM

## 2016-08-25 DIAGNOSIS — Z8543 Personal history of malignant neoplasm of ovary: Secondary | ICD-10-CM | POA: Diagnosis not present

## 2016-08-25 DIAGNOSIS — Z8041 Family history of malignant neoplasm of ovary: Secondary | ICD-10-CM

## 2016-08-25 DIAGNOSIS — E785 Hyperlipidemia, unspecified: Secondary | ICD-10-CM

## 2016-08-25 DIAGNOSIS — E559 Vitamin D deficiency, unspecified: Secondary | ICD-10-CM

## 2016-08-25 LAB — CBC WITH DIFFERENTIAL (CANCER CENTER ONLY)
BASO#: 0 10*3/uL (ref 0.0–0.2)
BASO%: 0.3 % (ref 0.0–2.0)
EOS%: 2.2 % (ref 0.0–7.0)
Eosinophils Absolute: 0.2 10*3/uL (ref 0.0–0.5)
HCT: 40.7 % (ref 34.8–46.6)
HEMOGLOBIN: 13.8 g/dL (ref 11.6–15.9)
LYMPH#: 2.3 10*3/uL (ref 0.9–3.3)
LYMPH%: 30.4 % (ref 14.0–48.0)
MCH: 30.7 pg (ref 26.0–34.0)
MCHC: 33.9 g/dL (ref 32.0–36.0)
MCV: 90 fL (ref 81–101)
MONO#: 0.5 10*3/uL (ref 0.1–0.9)
MONO%: 6.2 % (ref 0.0–13.0)
NEUT%: 60.9 % (ref 39.6–80.0)
NEUTROS ABS: 4.5 10*3/uL (ref 1.5–6.5)
PLATELETS: 271 10*3/uL (ref 145–400)
RBC: 4.5 10*6/uL (ref 3.70–5.32)
RDW: 13.3 % (ref 11.1–15.7)
WBC: 7.4 10*3/uL (ref 3.9–10.0)

## 2016-08-25 LAB — CMP (CANCER CENTER ONLY)
ALBUMIN: 3.8 g/dL (ref 3.3–5.5)
ALT(SGPT): 17 U/L (ref 10–47)
AST: 25 U/L (ref 11–38)
Alkaline Phosphatase: 58 U/L (ref 26–84)
BUN, Bld: 10 mg/dL (ref 7–22)
CALCIUM: 10 mg/dL (ref 8.0–10.3)
CHLORIDE: 102 meq/L (ref 98–108)
CO2: 29 mEq/L (ref 18–33)
CREATININE: 0.9 mg/dL (ref 0.6–1.2)
Glucose, Bld: 131 mg/dL — ABNORMAL HIGH (ref 73–118)
Potassium: 3.8 mEq/L (ref 3.3–4.7)
SODIUM: 143 meq/L (ref 128–145)
TOTAL PROTEIN: 6.8 g/dL (ref 6.4–8.1)
Total Bilirubin: 0.6 mg/dl (ref 0.20–1.60)

## 2016-08-25 NOTE — Progress Notes (Signed)
Hematology and Oncology Follow Up Visit  Carolyn Wilson [id number] Oct 05, 1970 46 y.o. 08/25/2016   Principle Diagnosis:  History of Sertoli-Leydig tumor of the left ovary  Strong family history of breast and ovarian cancer - BRCA 1/2 and p53 negative  Current Therapy:   Observation    Interim History:  Carolyn Wilson is here today for a follow-up. Unfortunately, her mom is in the hospital up in [place]. She has pulmonary emboli. She apparently has a pelvic mass. There is cancer in the family. I told her that we can really help out anyway possible. She has a questions she can always call us.   Otherwise, she, herself is doing well. She does have elevated lipids. She is working on this. She is exercising.   She did have a nice Christmas. She denies new years.   Her son is in graduate program up in [place] [employer]. It sounds like he is doing very well.   There's been no problems with nausea or vomiting. She's had no change in bowel or bladder habits. She's had no cough. She's got through the flu season. She did get a flu vaccine.   She is due for a mammogram I think in March.   Of note, she is BRCA negative.  Overall, her performance status is ECOG 0.   Medications:  Allergies as of 08/25/2016      Reactions   Latex Rash   Causes blisters   Peanut-containing Drug Products Anaphylaxis   Sulfa Antibiotics Rash, Shortness Of Breath   Shortness of breath, rash Shortness of breath, rash   Adhesive [tape]    Canagliflozin Other (See Comments)   Reglan [metoclopramide] Tinitus   Ciprofloxacin Rash   Sulfur Rash      Medication List       Accurate as of 08/25/16  8:14 AM. Always use your most recent med list.          aspirin 81 MG tablet Take 81 mg by mouth daily.   EPINEPHrine 0.3 mg/0.3 mL Soaj injection Commonly known as:  EPI-PEN Inject once into muscle for any allergic reaction to peanut exposure   esomeprazole 20 MG capsule Commonly known as:   NEXIUM Take 1 capsule (20 mg total) by mouth daily at 12 noon.   glucose blood test strip Commonly known as:  BAYER CONTOUR NEXT TEST 1 each by Other route as needed for other. Check blood sugar twice a day before meals and as needed. E11.9   metFORMIN 500 MG 24 hr tablet Commonly known as:  GLUCOPHAGE XR Take 1 tablet (500 mg total) by mouth daily with breakfast.   MULTI VITAMIN DAILY Tabs   ondansetron 4 MG tablet Commonly known as:  ZOFRAN Take 4 mg by mouth every 8 (eight) hours as needed for nausea or vomiting.       Allergies:  Allergies  Allergen Reactions  . Latex Rash    Causes blisters  . Peanut-Containing Drug Products Anaphylaxis  . Sulfa Antibiotics Rash and Shortness Of Breath    Shortness of breath, rash Shortness of breath, rash  . Adhesive [Tape]   . Canagliflozin Other (See Comments)  . Reglan [Metoclopramide] Tinitus  . Ciprofloxacin Rash  . Sulfur Rash    Past Medical History, Surgical history, Social history, and Family History were reviewed and updated.  Review of Systems: All other 10 point review of systems is negative.   Physical Exam:  weight is 155 lb (70.3 kg). Her oral temperature is 97.4 F (  36.3 C). Her blood pressure is 119/74 and her pulse is 90.   Wt Readings from Last 3 Encounters:  08/25/16 155 lb (70.3 kg)  08/04/16 160 lb (72.6 kg)  04/28/16 154 lb (69.9 kg)    Well-developed and well-nourished white female in no obvious distress. Head and neck exam shows no ocular or oral lesions. She has no palpable cervical or supraclavicular lymph nodes. Lungs are clear bilaterally. Cardiac exam regular rate and rhythm with no murmurs, rubs or bruits. Breast exam shows left breast with no masses, edema or erythema. There is no left axillary adenopathy. Right breast shows some slight firmness about the aerial at the 10:00 position. No distinct masses noted. There is no discharge noted. There is no right axillary adenopathy. Abdomen is soft.  She has good bowel sounds. There is no fluid wave. There is no palpable liver or spleen tip. Back exam shows no tenderness over the spine, ribs or hips. Extremities shows no clubbing, cyanosis or edema. She has good range was rejoice. Neurological exam shows no focal neurological deficits.  .   Lab Results  Component Value Date   WBC 6.7 08/04/2016   HGB 13.7 08/04/2016   HCT 40.4 08/04/2016   MCV 87.9 08/04/2016   PLT 301.0 08/04/2016   No results found for: FERRITIN, IRON, TIBC, UIBC, IRONPCTSAT Lab Results  Component Value Date   RBC 4.59 08/04/2016   No results found for: KPAFRELGTCHN, LAMBDASER, KAPLAMBRATIO No results found for: IGGSERUM, IGA, IGMSERUM No results found for: Odetta Pink, SPEI   Chemistry      Component Value Date/Time   NA 143 08/25/2016 0741   K 3.8 08/25/2016 0741   CL 102 08/25/2016 0741   CO2 29 08/25/2016 0741   BUN 10 08/25/2016 0741   CREATININE 0.9 08/25/2016 0741      Component Value Date/Time   CALCIUM 10.0 08/25/2016 0741   ALKPHOS 58 08/25/2016 0741   AST 25 08/25/2016 0741   ALT 17 08/25/2016 0741   BILITOT 0.60 08/25/2016 0741     Impression and Plan: Carolyn Wilson is a 46 yo post-menopausal female with a 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   She has a strong family history of breast and ovarian cancer.   From my point of view everything looks okay.  We will plan to get her back in 6 months. She does have gynecology follow-up. She does see Dr. Harvie Heck of internal medicine. I'm sure he will help manage the hyperlipidemia.   I did tell her to take 2000 units of vitamin D.  I'll have to see one her last bone density test was.  Volanda Napoleon, MD 2/5/20188:14 AM

## 2016-08-27 MED ORDER — ONETOUCH ULTRASOFT LANCETS MISC: 12 refills | Status: DC

## 2016-08-27 MED ORDER — GLUCOSE BLOOD VI STRP: ORAL_STRIP | 12 refills | Status: DC

## 2016-09-02 ENCOUNTER — Telehealth: Payer: Self-pay

## 2016-09-02 NOTE — Telephone Encounter (Signed)
Left message to call [place] [facility] at [phone].  Need to check to see if patient is scheduled for mammogram and breast MRI. If not does she need assistance with scheduling? Which location?

## 2016-09-04 NOTE — Telephone Encounter (Signed)
Patient returned call

## 2016-09-05 NOTE — Telephone Encounter (Signed)
Spoke with the [facility] regarding recommended imaging at this time as patient is due for MRI and mammogram. Per [facility] the patient will need to have a mammogram first. Spoke with patient. Patient has moved to [facility] and would like to have her imaging done with [facility] imaging. Patient is available to any day for appointment. Advised will contact their office and return call regarding appointment date and time. Patient is agreeable.  Spoke with [facility]. 3D mammogram scheduled for 09/09/2016 at 9:00 am. Per [facility] they will request all patient records from the [facility] before the patient's appointment.  Left message for patient to call [facility] at [phone].

## 2016-09-05 NOTE — Telephone Encounter (Signed)
Spoke with patient. Advised of information received from the [facility]. Advised of 3D screening mammogram appointment scheduled for 09/09/2016 at 9 am at [place], [place] [id number]. Patient is agreeable to date and time. [facility] will request her previous records. Advised recommendations for MRI will be made following screening. Patient is agreeable.  Routing to [place] for review before closing.

## 2016-09-10 ENCOUNTER — Encounter: Payer: Self-pay | Admitting: Medical

## 2016-09-10 MED ORDER — ONDANSETRON 4 MG PO TBDP: 4.0000 mg | ORAL_TABLET | Freq: Three times a day (TID) | ORAL | 0 refills | Status: DC | PRN

## 2016-09-10 NOTE — Telephone Encounter (Signed)
zofran rx sent to pt pharmacy. Notify pt.

## 2016-09-11 NOTE — Telephone Encounter (Signed)
MyChart message sent to patient making her aware.

## 2016-09-15 ENCOUNTER — Other Ambulatory Visit: Payer: Self-pay | Admitting: Medical

## 2016-09-16 ENCOUNTER — Telehealth: Payer: Self-pay | Admitting: Obstetrics & Gynecology

## 2016-09-16 NOTE — Telephone Encounter (Signed)
MMG results to Dr. Talbert Nan, covering provider for Dr. [name] Heck,  for review.  Dr. Talbert Nan can you review MMG results and advise?  Cc: Dr. [name] Heck

## 2016-09-16 NOTE — Telephone Encounter (Signed)
Spoke with patient, advised of results and recommendations as seen below per Dr. Talbert Nan. Patient verbalizes understanding and is agreeable.  Routing to provider for final review. Patient is agreeable to disposition. Will close encounter.

## 2016-09-16 NOTE — Telephone Encounter (Signed)
Please let the patient know that her mammogram is normal. She has dense breasts. She has a high risk of breast cancer and is gettting MRI's as well. F/U mammogram in 1 year.

## 2016-09-16 NOTE — Telephone Encounter (Signed)
Patient called requesting her mammogram results from last week done at [facility].

## 2016-09-23 ENCOUNTER — Encounter: Payer: Self-pay | Admitting: Obstetrics & Gynecology

## 2016-10-13 ENCOUNTER — Ambulatory Visit (INDEPENDENT_AMBULATORY_CARE_PROVIDER_SITE_OTHER): Payer: BC Managed Care – PPO | Admitting: Medical

## 2016-10-13 VITALS — BP 115/80 | HR 97 | Temp 98.0°F | Ht 65.4 in | Wt 147.4 lb

## 2016-10-13 DIAGNOSIS — J3489 Other specified disorders of nose and nasal sinuses: Secondary | ICD-10-CM | POA: Diagnosis not present

## 2016-10-13 DIAGNOSIS — R110 Nausea: Secondary | ICD-10-CM | POA: Diagnosis not present

## 2016-10-13 DIAGNOSIS — R059 Cough, unspecified: Secondary | ICD-10-CM

## 2016-10-13 DIAGNOSIS — R0981 Nasal congestion: Secondary | ICD-10-CM

## 2016-10-13 DIAGNOSIS — R05 Cough: Secondary | ICD-10-CM | POA: Diagnosis not present

## 2016-10-13 DIAGNOSIS — B349 Viral infection, unspecified: Secondary | ICD-10-CM | POA: Diagnosis not present

## 2016-10-13 LAB — POC INFLUENZA A&B (BINAX/QUICKVUE)
INFLUENZA B, POC: NEGATIVE
Influenza A, POC: NEGATIVE

## 2016-10-13 NOTE — Progress Notes (Addendum)
Subjective:    Patient ID: Carolyn Wilson, female    DOB: 07/28/1970, 46 y.o.   MRN: [id number]  HPI   Pt in with recent illness since Saturday. Pt states on Saturday got sick with some dizziness followed by vomiting. She vomited about 10 times on Saturday. Pt states on sunday vomiting started to tapered off(today no vomiting). Pt states no diarrhea. Pt does not describe eating any suspicious foods.    Pt states on Saturday night she started to get nasal congested. Pt is blowing out some mucous from her sinuses. Pt ears hurt some.  Pt coughing up some mild mucous. No wheezing.  No states faint body aches at best. Really does not repeat diffuse severes myalgias.  Pt states flew back from conference.(work conference.)   Pt states up until yesterday sugars have been 101 on average. With recent illness max was 150 recent.     Review of Systems  Constitutional: Positive for chills, fatigue and fever.       10 0 at most.  HENT: Positive for congestion, ear pain, sinus pain and sinus pressure. Negative for sneezing.   Eyes: Negative for itching.  Respiratory: Positive for cough. Negative for chest tightness and wheezing.   Cardiovascular: Negative for chest pain and palpitations.  Gastrointestinal: Positive for vomiting. Negative for abdominal pain, anal bleeding, constipation and diarrhea.  Endocrine: Negative for polydipsia, polyphagia and polyuria.  Genitourinary: Negative for difficulty urinating, dysuria and vaginal pain.  Musculoskeletal: Negative for back pain, joint swelling, neck pain and neck stiffness.       Faint low level mygaliga at best  Neurological: Positive for headaches. Negative for dizziness.  Hematological: Negative for adenopathy. Does not bruise/bleed easily.  Psychiatric/Behavioral: Negative for behavioral problems and confusion.    Past Medical History:  Diagnosis Date  . Abnormal Pap smear of cervix    h/o colposcopy/biopsy  . Allergy   . Asthma     HX  . Breast cancer ([place]) 2007  . Cancer ([place]) [id number]   ovarian  . Diabetes mellitus without complication ([place]) 57/2620    diagnosed by PCP, [name]   . GERD (gastroesophageal reflux disease)   . Hyperlipemia    on medication      Social History   Social History  . Marital status: Married    Spouse name: N/A  . Number of children: N/A  . Years of education: N/A   Occupational History  . Not on file.   Social History Main Topics  . Smoking status: Never Smoker  . Smokeless tobacco: Never Used     Comment: NEVER USED TOBACCO  . Alcohol use 1.2 oz/week    2 Standard drinks or equivalent per week  . Drug use: No  . Sexual activity: Yes    Partners: Male    Birth control/ protection: Surgical   Other Topics Concern  . Not on file   Social History Narrative  . No narrative on file    Past Surgical History:  Procedure Laterality Date  . APPENDECTOMY    . BREAST LUMPECTOMY     left breast  . CHOLECYSTECTOMY    . ENDOMETRIAL ABLATION     5 yrs ago. No menses since.  [name] LAPAROSCOPIC SALPINGO OOPHERECTOMY Left     with right tubal ligation    Family History  Problem Relation Age of Onset  . Diabetes Mother   . Colon cancer Father   . Breast cancer Maternal Aunt   . Ovarian cancer Maternal  Aunt   . Ovarian cancer Cousin   . Colon cancer Cousin   . Melanoma Cousin   . Breast cancer Maternal Grandmother   . Cancer Other     breast and ovarian-maternal side    Allergies  Allergen Reactions  . Latex Rash    Causes blisters  . Peanut-Containing Drug Products Anaphylaxis  . Sulfa Antibiotics Rash and Shortness Of Breath    Shortness of breath, rash Shortness of breath, rash  . Adhesive [Tape]   . Canagliflozin Other (See Comments)  . Reglan [Metoclopramide] Tinitus  . Ciprofloxacin Rash  . Sulfur Rash    Current Outpatient Prescriptions on File Prior to Visit  Medication Sig Dispense Refill  . aspirin 81 MG tablet Take 81 mg by mouth daily.      [name] EPINEPHrine 0.3 mg/0.3 mL IJ SOAJ injection Inject once into muscle for any allergic reaction to peanut exposure 1 Device 1  . esomeprazole (NEXIUM) 20 MG capsule Take 1 capsule (20 mg total) by mouth daily at 12 noon. 90 capsule 1  . glucose blood (ONETOUCH VERIO) test strip Check blood sugar daily as directed 100 each 12  . Lancets (ONETOUCH ULTRASOFT) lancets Check blood sugar daily as directed. 100 each 12  . metFORMIN (GLUCOPHAGE-XR) 500 MG 24 hr tablet take 1 tablet by mouth once daily WITH BREAKFAST 30 tablet 1  . Multiple Vitamin (MULTI VITAMIN DAILY) TABS     . ondansetron (ZOFRAN ODT) 4 MG disintegrating tablet Take 1 tablet (4 mg total) by mouth every 8 (eight) hours as needed for nausea or vomiting. 20 tablet 0  . ondansetron (ZOFRAN) 4 MG tablet Take 4 mg by mouth every 8 (eight) hours as needed for nausea or vomiting.     No current facility-administered medications on file prior to visit.     BP 115/80   Pulse 97   Temp 98 F (36.7 C) (Oral)   Ht 5' 5.4" (1.661 m)   Wt 147 lb 6.4 oz (66.9 kg)   SpO2 98%   BMI 24.23 kg/m       Objective:   Physical Exam   General  Mental Status - Alert. General Appearance - Well groomed. Not in acute distress.  Skin Rashes- No Rashes.  HEENT Head- Normal. Ear Auditory Canal - Left- Normal. Right - Normal.Tympanic Membrane- Left- Normal. Right- Normal. Eye Sclera/Conjunctiva- Left- Normal. Right- Normal. Nose & Sinuses Nasal Mucosa- Left-  Boggy and Congested. Right-  Boggy and  Congested.Bilateral  No maxillary and  No frontal sinus pressure. Mouth & Throat Lips: Upper Lip- Normal: no dryness, cracking, pallor, cyanosis, or vesicular eruption. Lower Lip-Normal: no dryness, cracking, pallor, cyanosis or vesicular eruption. Buccal Mucosa- Bilateral- No Aphthous ulcers. Oropharynx- No Discharge or Erythema. Tonsils: Characteristics- Bilateral- No Erythema or Congestion. Size/Enlargement- Bilateral- No enlargement.  Discharge- bilateral-None.  Neck Neck- Supple. No Masses.   Chest and Lung Exam Auscultation: Breath Sounds:-Clear even and unlabored.  Cardiovascular Auscultation:Rythm- Regular, rate and rhythm. Murmurs & Other Heart Sounds:Ausculatation of the heart reveal- No Murmurs.  Lymphatic Head & Neck General Head & Neck Lymphatics: Bilateral: Description- No Localized lymphadenopathy.    Abdomen Inspection:-Inspection Normal.  Palpation/Perucssion: Palpation and Percussion of the abdomen reveal- Non Tender, No Rebound tenderness, No rigidity(Guarding) and No Palpable abdominal masses.  Liver:-Normal.  Spleen:- Normal.   Back- no cva tenderness.         Assessment & Plan:  You have possible flu but doubtful. Since at edge of treatment window will get  rapid test and follow. Treat if positive.  For viral syndrome rest hydrate and tylenol for fever.  For nasal congestion  And sinus pressure flonase rx. For cough benzonatate.  For recent nausea and vomiting. Rx zofran.   Work until March 28,2018.  If symptoms worsen or change notify us.  Rapid flu test was negative today.  Follow up 7-10 days or as needed.

## 2016-10-13 NOTE — Patient Instructions (Addendum)
You have possible flu but doubtful. Since at edge of treatment window will get rapid test and follow. Treat if positive.  For viral syndrome rest hydrate and tylenol for fever.  For nasal congestion  And sinus pressure flonase rx. For cough benzonatate.  For recent nausea and vomiting. Rx zofran.   Work until March 28,2018.  If symptoms worsen or change notify us.  Follow up 7-10 days or as needed.

## 2016-10-13 NOTE — Progress Notes (Signed)
Pre visit review using our clinic tool,if applicable. No additional management support is needed unless otherwise documented below in the visit note.  

## 2016-10-23 ENCOUNTER — Encounter: Payer: Self-pay | Admitting: Medical

## 2016-10-23 NOTE — Progress Notes (Signed)
Abstract Diabetic eye exam from [place], Cathy A Fulp, OD on 08/04/2016; send for scan/SLS 04/045

## 2016-11-06 ENCOUNTER — Encounter: Payer: Self-pay | Admitting: Medical

## 2016-11-07 MED ORDER — METFORMIN HCL ER 500 MG PO TB24: ORAL_TABLET | ORAL | 0 refills | Status: DC

## 2016-11-25 ENCOUNTER — Ambulatory Visit (INDEPENDENT_AMBULATORY_CARE_PROVIDER_SITE_OTHER): Payer: BC Managed Care – PPO | Admitting: Medical

## 2016-11-25 ENCOUNTER — Telehealth: Payer: Self-pay | Admitting: Medical

## 2016-11-25 ENCOUNTER — Encounter: Payer: Self-pay | Admitting: Medical

## 2016-11-25 VITALS — BP 113/83 | HR 93 | Temp 97.8°F | Resp 16 | Ht 65.0 in | Wt 149.4 lb

## 2016-11-25 DIAGNOSIS — E785 Hyperlipidemia, unspecified: Secondary | ICD-10-CM

## 2016-11-25 DIAGNOSIS — E119 Type 2 diabetes mellitus without complications: Secondary | ICD-10-CM | POA: Diagnosis not present

## 2016-11-25 LAB — COMPREHENSIVE METABOLIC PANEL
ALK PHOS: 58 U/L (ref 39–117)
ALT: 20 U/L (ref 0–35)
AST: 18 U/L (ref 0–37)
Albumin: 4.3 g/dL (ref 3.5–5.2)
BILIRUBIN TOTAL: 0.6 mg/dL (ref 0.2–1.2)
BUN: 12 mg/dL (ref 6–23)
CO2: 31 meq/L (ref 19–32)
Calcium: 9.8 mg/dL (ref 8.4–10.5)
Chloride: 103 mEq/L (ref 96–112)
Creatinine, Ser: 0.86 mg/dL (ref 0.40–1.20)
GFR: 75.69 mL/min (ref >60.00)
GLUCOSE: 125 mg/dL — AB (ref 70–99)
POTASSIUM: 4.3 meq/L (ref 3.5–5.1)
SODIUM: 139 meq/L (ref 135–145)
Total Protein: 7.2 g/dL (ref 6.0–8.3)

## 2016-11-25 LAB — LIPID PANEL
CHOL/HDL RATIO: 5
Cholesterol: 227 mg/dL — ABNORMAL HIGH (ref 0–200)
HDL: 49 mg/dL (ref >39.00)
LDL Cholesterol: 148 mg/dL — ABNORMAL HIGH (ref 0–99)
NONHDL: 178.14
Triglycerides: 151 mg/dL — ABNORMAL HIGH (ref 0.0–149.0)
VLDL: 30.2 mg/dL (ref 0.0–40.0)

## 2016-11-25 LAB — HEMOGLOBIN A1C: Hgb A1c MFr Bld: 6.6 % — ABNORMAL HIGH (ref 4.6–6.5)

## 2016-11-25 MED ORDER — METFORMIN HCL ER 500 MG PO TB24
ORAL_TABLET | ORAL | 0 refills | Status: DC
Start: 2016-11-25 — End: 2016-12-30

## 2016-11-25 MED ORDER — EZETIMIBE 10 MG PO TABS: 10.0000 mg | ORAL_TABLET | Freq: Every day | ORAL | 3 refills | Status: DC

## 2016-11-25 NOTE — Progress Notes (Signed)
Subjective:    Patient ID: Carolyn Wilson, female    DOB: Jul 20, 1971, 46 y.o.   MRN: [id number]  HPI  Pt feels good/ well today  Pt in for follow on sugar levels/diabetes. Pt has been exercising. 6 days a week. She does kick boxing, zumba and lifting weights. Pt has lost wight since last January. Purposeful weight loss.  Pt cholesterol in January was on high side. Pt tried statin in past and had muscle aches. Mom had very high cholesterol. Pt has eaten better/ess cholesterol since last visit.     Review of Systems  Constitutional: Negative for chills, fatigue and fever.  Respiratory: Negative for cough, chest tightness, shortness of breath and wheezing.   Cardiovascular: Negative for palpitations.  Gastrointestinal: Negative for abdominal pain.  Genitourinary: Negative for dysuria and frequency.  Musculoskeletal: Negative for back pain and gait problem.  Neurological: Negative for dizziness, seizures, speech difficulty, numbness and headaches.  Hematological: Negative for adenopathy. Does not bruise/bleed easily.  Psychiatric/Behavioral: Negative for confusion.    Past Medical History:  Diagnosis Date  . Abnormal Pap smear of cervix    h/o colposcopy/biopsy  . Allergy   . Asthma    HX  . Breast cancer ([place]) 2007  . Cancer ([place]) [id number]   ovarian  . Diabetes mellitus without complication ([place]) 21/1941    diagnosed by PCP, [name]   . GERD (gastroesophageal reflux disease)   . Hyperlipemia    on medication      Social History   Social History  . Marital status: Married    Spouse name: N/A  . Number of children: N/A  . Years of education: N/A   Occupational History  . Not on file.   Social History Main Topics  . Smoking status: Never Smoker  . Smokeless tobacco: Never Used     Comment: NEVER USED TOBACCO  . Alcohol use 1.2 oz/week    2 Standard drinks or equivalent per week  . Drug use: No  . Sexual activity: Yes    Partners: Male    Birth  control/ protection: Surgical   Other Topics Concern  . Not on file   Social History Narrative  . No narrative on file    Past Surgical History:  Procedure Laterality Date  . APPENDECTOMY    . BREAST LUMPECTOMY     left breast  . CHOLECYSTECTOMY    . ENDOMETRIAL ABLATION     5 yrs ago. No menses since.  [name] LAPAROSCOPIC SALPINGO OOPHERECTOMY Left     with right tubal ligation    Family History  Problem Relation Age of Onset  . Diabetes Mother   . Colon cancer Father   . Breast cancer Maternal Aunt   . Ovarian cancer Maternal Aunt   . Ovarian cancer Cousin   . Colon cancer Cousin   . Melanoma Cousin   . Breast cancer Maternal Grandmother   . Cancer Other     breast and ovarian-maternal side    Allergies  Allergen Reactions  . Latex Rash    Causes blisters  . Peanut-Containing Drug Products Anaphylaxis  . Sulfa Antibiotics Rash and Shortness Of Breath    Shortness of breath, rash Shortness of breath, rash  . Adhesive [Tape]   . Canagliflozin Other (See Comments)  . Reglan [Metoclopramide] Tinitus  . Ciprofloxacin Rash  . Sulfur Rash    Current Outpatient Prescriptions on File Prior to Visit  Medication Sig Dispense Refill  . aspirin 81 MG tablet  Take 81 mg by mouth daily.    [name] EPINEPHrine 0.3 mg/0.3 mL IJ SOAJ injection Inject once into muscle for any allergic reaction to peanut exposure 1 Device 1  . esomeprazole (NEXIUM) 20 MG capsule Take 1 capsule (20 mg total) by mouth daily at 12 noon. 90 capsule 1  . glucose blood (ONETOUCH VERIO) test strip Check blood sugar daily as directed 100 each 12  . Lancets (ONETOUCH ULTRASOFT) lancets Check blood sugar daily as directed. 100 each 12  . metFORMIN (GLUCOPHAGE-XR) 500 MG 24 hr tablet take 1 tablet by mouth once daily WITH BREAKFAST 30 tablet 0  . Multiple Vitamin (MULTI VITAMIN DAILY) TABS     . ondansetron (ZOFRAN ODT) 4 MG disintegrating tablet Take 1 tablet (4 mg total) by mouth every 8 (eight) hours as needed  for nausea or vomiting. 20 tablet 0  . ondansetron (ZOFRAN) 4 MG tablet Take 4 mg by mouth every 8 (eight) hours as needed for nausea or vomiting.     No current facility-administered medications on file prior to visit.     BP 113/83 (BP Location: Right Arm, Patient Position: Sitting, Cuff Size: Normal)   Pulse 93   Temp 97.8 F (36.6 C) (Oral)   Resp 16   Ht 5\' 5"  (1.651 m)   Wt 149 lb 6.4 oz (67.8 kg)   SpO2 100%   BMI 24.86 kg/m       Objective:   Physical Exam  General- No acute distress. Pleasant patient. Neck- Full range of motion, no jvd Lungs- Clear, even and unlabored. Heart- regular rate and rhythm. Neurologic- CNII- XII grossly intact.   Lower ext- see quality metrics.      Assessment & Plan:  For diabetes will get a1c today and assess average blood sugar. Then decide if changes need to be made.  For hyperlipidemia will check lipid panel and consider medication if needed.   Continue diet and exercise.  Follow up date to be determined after lab review.  Lindy Pennisi, Percell Miller, PA-C   Pt uses [facility] in [place].

## 2016-11-25 NOTE — Patient Instructions (Signed)
For diabetes will get a1c today and assess average blood sugar. Then decide if changes need to be made.  For hyperlipidemia will check lipid panel and consider medication if needed.   Continue diet and exercise.  Follow up date to be determined after lab review.

## 2016-11-25 NOTE — Progress Notes (Signed)
Pre visit review using our clinic review tool, if applicable. No additional management support is needed unless otherwise documented below in the visit note. 

## 2016-11-25 NOTE — Telephone Encounter (Signed)
rx zetia and meformin sent to pharmacy.

## 2016-11-27 ENCOUNTER — Encounter: Payer: Self-pay | Admitting: Medical

## 2016-11-27 ENCOUNTER — Telehealth: Payer: Self-pay

## 2016-11-27 MED ORDER — OMEPRAZOLE 20 MG PO CPDR: 20.0000 mg | DELAYED_RELEASE_CAPSULE | Freq: Every day | ORAL | 3 refills | Status: DC

## 2016-11-27 NOTE — Telephone Encounter (Signed)
Omeprazole 20 mg sent to pt pharmacy. Please notify her. Dc nexium.

## 2016-11-27 NOTE — Telephone Encounter (Signed)
Pt  requesting change to omeprazole from Nexium due to cost.   Please advise.

## 2016-12-03 ENCOUNTER — Ambulatory Visit: Payer: BC Managed Care – PPO | Admitting: Medical

## 2016-12-05 ENCOUNTER — Telehealth: Payer: Self-pay | Admitting: Medical

## 2016-12-05 NOTE — Telephone Encounter (Signed)
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 Could erupt if she is sick, stressed, etc.   Pt expresses understanding.

## 2016-12-05 NOTE — Telephone Encounter (Signed)
Caller name: Relationship to patient: Self Can be reached: [phone] Pharmacy:  Reason for call: Patient request call back to find out if she can get shingles from her husband who was just dx today.

## 2016-12-17 ENCOUNTER — Encounter: Payer: Self-pay | Admitting: Family Medicine

## 2016-12-17 ENCOUNTER — Telehealth: Payer: Self-pay | Admitting: Obstetrics & Gynecology

## 2016-12-17 ENCOUNTER — Ambulatory Visit (INDEPENDENT_AMBULATORY_CARE_PROVIDER_SITE_OTHER): Payer: BC Managed Care – PPO | Admitting: Family Medicine

## 2016-12-17 DIAGNOSIS — M25511 Pain in right shoulder: Secondary | ICD-10-CM | POA: Diagnosis not present

## 2016-12-17 MED ORDER — PREDNISONE 10 MG PO TABS: ORAL_TABLET | ORAL | 0 refills | Status: DC

## 2016-12-17 NOTE — Telephone Encounter (Signed)
Patient has not had an cycle in ten years. She has started bleeding and wants to speak with Dr. [name] Heck.

## 2016-12-17 NOTE — Telephone Encounter (Signed)
Spoke with patient. Patient reports spotting started on 12/16/16, woke up with blood in panties this morning, wearing a panty liner. Patient reports ablation 12 years ago, no cycle since. Patient reports vaginal dryness over a month ago, was recommended to use Replens, has not used. Patient denies pain, nausea/vomiting, or urinary complaints. Recommended OV for further evaluation, patient scheduled with Dr. [name] Heck on 12/18/16 at 9:15am. Patient is agreeable to date and time.   Routing to provider for final review. Patient is agreeable to disposition. Will close encounter.

## 2016-12-17 NOTE — Patient Instructions (Signed)
Your current exam is consistent with rotator cuff strain (of supraspinatus muscles) with nerve irritation. You may have scapulothoracic bursitis as well but right now your exam is negative for this. Take prednisone as directed for 6 days. Day AFTER finishing this you can take ibuprofen or aleve if needed for pain and inflammation. As pain starts to improve start the theraband strengthening exercises and scapular strengthening 3 sets of 10 once a day. Consider physical therapy, imaging if not improving as expected. Focus on lower body exercises and cardio the next couple days then as pain allows can do upper body exercises. Follow up with me in 2 weeks but call me early next week if you're still struggling.

## 2016-12-18 ENCOUNTER — Encounter: Payer: Self-pay | Admitting: Obstetrics & Gynecology

## 2016-12-18 ENCOUNTER — Ambulatory Visit: Payer: Self-pay | Admitting: Obstetrics & Gynecology

## 2016-12-18 VITALS — BP 104/78 | HR 92 | Resp 16 | Ht 65.0 in | Wt 151.0 lb

## 2016-12-18 DIAGNOSIS — M25511 Pain in right shoulder: Secondary | ICD-10-CM | POA: Insufficient documentation

## 2016-12-18 DIAGNOSIS — Z9189 Other specified personal risk factors, not elsewhere classified: Secondary | ICD-10-CM

## 2016-12-18 DIAGNOSIS — N950 Postmenopausal bleeding: Secondary | ICD-10-CM

## 2016-12-18 HISTORY — DX: Pain in right shoulder: M25.511

## 2016-12-18 NOTE — Progress Notes (Signed)
PCP and consultation requested by: Mackie Pai, PA-C  Subjective:   HPI: Patient is a 46 y.o. female here for right shoulder pain.  Patient denies known injury or trauma. She feels improved today compared to previous but states about 6 days ago she started to get right shoulder pain. Caused a constant ache. Pain worse with shrugging and reaching across her body. Tried heat, icing, naproxen, topical cream. Heard popping sounds when shrugging also. No prior issues with this shoulder. Pain is 4/10 in lateral shoulder, scapular regions. No skin changes, numbness.  Past Medical History:  Diagnosis Date  . Abnormal Pap smear of cervix    h/o colposcopy/biopsy  . Allergy   . Asthma    HX  . Breast cancer ([place]) 2007  . Cancer ([place]) [id number]   ovarian  . Diabetes mellitus without complication ([place]) 97/9480    diagnosed by PCP, [name]   . GERD (gastroesophageal reflux disease)   . Hyperlipemia    on medication     Current Outpatient Prescriptions on File Prior to Visit  Medication Sig Dispense Refill  . aspirin 81 MG tablet Take 81 mg by mouth daily.    [name] EPINEPHrine 0.3 mg/0.3 mL IJ SOAJ injection Inject once into muscle for any allergic reaction to peanut exposure 1 Device 1  . ezetimibe (ZETIA) 10 MG tablet Take 1 tablet (10 mg total) by mouth daily. 30 tablet 3  . glucose blood (ONETOUCH VERIO) test strip Check blood sugar daily as directed 100 each 12  . Lancets (ONETOUCH ULTRASOFT) lancets Check blood sugar daily as directed. 100 each 12  . metFORMIN (GLUCOPHAGE-XR) 500 MG 24 hr tablet take 1 tablet by mouth once daily WITH BREAKFAST 90 tablet 0  . Multiple Vitamin (MULTI VITAMIN DAILY) TABS     . omeprazole (PRILOSEC) 20 MG capsule Take 1 capsule (20 mg total) by mouth daily. 30 capsule 3  . ondansetron (ZOFRAN ODT) 4 MG disintegrating tablet Take 1 tablet (4 mg total) by mouth every 8 (eight) hours as needed for nausea or vomiting. 20 tablet 0   No current  facility-administered medications on file prior to visit.     Past Surgical History:  Procedure Laterality Date  . APPENDECTOMY    . BREAST LUMPECTOMY     left breast  . CHOLECYSTECTOMY    . ENDOMETRIAL ABLATION     5 yrs ago. No menses since.  [name] LAPAROSCOPIC SALPINGO OOPHERECTOMY Left     with right tubal ligation    Allergies  Allergen Reactions  . Latex Rash    Causes blisters  . Peanut-Containing Drug Products Anaphylaxis  . Sulfa Antibiotics Rash and Shortness Of Breath    Shortness of breath, rash Shortness of breath, rash  . Adhesive [Tape]   . Canagliflozin Other (See Comments)  . Reglan [Metoclopramide] Tinitus  . Ciprofloxacin Rash  . Sulfur Rash    Social History   Social History  . Marital status: Married    Spouse name: N/A  . Number of children: N/A  . Years of education: N/A   Occupational History  . Not on file.   Social History Main Topics  . Smoking status: Never Smoker  . Smokeless tobacco: Never Used     Comment: NEVER USED TOBACCO  . Alcohol use 1.2 oz/week    2 Standard drinks or equivalent per week  . Drug use: No  . Sexual activity: Yes    Partners: Male    Birth control/ protection: Surgical  Other Topics Concern  . Not on file   Social History Narrative  . No narrative on file    Family History  Problem Relation Age of Onset  . Diabetes Mother   . Colon cancer Father   . Breast cancer Maternal Aunt   . Ovarian cancer Maternal Aunt   . Ovarian cancer Cousin   . Colon cancer Cousin   . Melanoma Cousin   . Breast cancer Maternal Grandmother   . Cancer Other        breast and ovarian-maternal side    BP 114/80   Pulse 90   Ht 5\' 5"  (1.651 m)   Wt 148 lb (67.1 kg)   LMP 12/15/2016   BMI 24.63 kg/m   Review of Systems: See HPI above.     Objective:  Physical Exam:  Gen: NAD, comfortable in exam room  Right shoulder: No swelling, ecchymoses.  No gross deformity, no scapular winging. TTP medial to scapula,  in trapezius, over supraspinatus muscle. FROM without painful arc.  No crepitus of scapula on shrug. Negative Hawkins, Neers. Negative Yergasons. Strength 5/5 with empty can and resisted internal/external rotation.  Pain empty can only. Negative apprehension. NV intact distally.  Neck: No gross deformity, swelling, bruising. TTP noted above.  No midline/bony TTP. FROM neck without pain. BUE strength 5/5.   Sensation intact to light touch.   2+ equal reflexes in triceps, biceps, brachioradialis tendons. Negative spurlings. NV intact distal BUEs.   Assessment & Plan:  1. Right shoulder pain - current exam consistent with supraspinatus strain though she describes possible scapulothoracic bursitis, local nerve root irritation also.  We discussed options - she would like to try prednisone dose pack then start home exercise program.  She will consider physical therapy if not improving.  F/u in 2 weeks for reevaluation.  Call us if any changes in her current symptoms.

## 2016-12-18 NOTE — Progress Notes (Signed)
GYNECOLOGY  VISIT   HPI: 46 y.o. [id number] Married Caucasian female here for complaint of bleeding.  Pt reports this started three days ago.  She has passed clots with this.  Denies breast tenderness or other PMS symptoms.  Denies acne.  Is having some occasional hot flashes and some vaginal dryness.    Pt has hx of endometrial ablation.  Most recent [place] was 70 and obtained 12/27/15.  Last PUS was 2016.  Endometrium was 65mm then.    GYNECOLOGIC HISTORY: Patient's last menstrual period was 12/15/2016. Contraception: BTL Menopausal hormone therapy: none  Patient Active Problem List   Diagnosis Date Noted  . Wellness examination 11/21/2015  . Diabetes ([place]) 09/04/2015  . Allergy with anaphylaxis due to peanuts 08/13/2014  . Abnormal Pap smear of cervix  remote [place]  1996 per pt 08/13/2014  . Allergic rhinitis 08/13/2014  . S/P endometrial ablation 08/13/2014  . GERD (gastroesophageal reflux disease) 08/13/2014  . Diarrhea post cholecystectomy since 1999 08/13/2014  . FHx: colon cancer  father  08/13/2014  . 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 Headache, migraine 08/02/2012  . Family history of breast cancer 01/06/2012  . Family history of malignant neoplasm of ovary 01/06/2012    Past Medical History:  Diagnosis Date  . Abnormal Pap smear of cervix    h/o colposcopy/biopsy  . Allergy   . Asthma    HX  . Breast cancer ([place]) 2007  . Cancer ([place]) [id number]   ovarian  . Diabetes mellitus without complication ([place]) 49/1791    diagnosed by PCP, [name]   . GERD (gastroesophageal reflux disease)   . Hyperlipemia    on medication     Past Surgical History:  Procedure Laterality Date  . APPENDECTOMY    . BREAST LUMPECTOMY     left breast  . CHOLECYSTECTOMY    . ENDOMETRIAL ABLATION     5 yrs ago. No menses since.  [name] LAPAROSCOPIC SALPINGO OOPHERECTOMY Left     with right tubal ligation    MEDS:  Reviewed in EPIC and UTD  ALLERGIES: Latex;  Peanut-containing drug products; Sulfa antibiotics; Adhesive [tape]; Canagliflozin; Reglan [metoclopramide]; Ciprofloxacin; and Sulfur  Family History  Problem Relation Age of Onset  . Diabetes Mother   . Colon cancer Father   . Breast cancer Maternal Aunt   . Ovarian cancer Maternal Aunt   . Ovarian cancer Cousin   . Colon cancer Cousin   . Melanoma Cousin   . Breast cancer Maternal Grandmother   . Cancer Other        breast and ovarian-maternal side    SH:  Married, non smoker  Review of Systems  Genitourinary:       Vaginal bleeding  All other systems reviewed and are negative.   PHYSICAL EXAMINATION:    BP 104/78 (BP Location: Left Arm, Patient Position: Sitting, Cuff Size: Normal)   Pulse 92   Resp 16   Ht 5\' 5"  (1.651 m)   Wt 151 lb (68.5 kg)   LMP 12/15/2016   BMI 25.13 kg/m     General appearance: alert, cooperative and appears stated age Abdomen: soft, non-tender; bowel sounds normal; no masses,  no organomegaly  Pelvic: External genitalia:  no lesions              Urethra:  normal appearing urethra with no masses, tenderness or lesions              Bartholins and Skenes: normal  Vagina: normal appearing vagina with normal color and discharge, no lesions              Cervix: no lesions and dark, old blood present at os              Bimanual Exam:  Uterus:  normal size, contour, position, consistency, mobility, non-tender              Adnexa: no mass, fullness, tenderness              Anus:  no lesions  Endometrial biopsy recommended.  Discussed with patient.  Verbal and written consent obtained.   Procedure:  Speculum placed.  Cervix visualized and cleansed with betadine prep.  A single toothed tenaculum was applied to the anterior lip of the cervix.  Endometrial pipelle was advanced through the cervix into the endometrial cavity without difficulty.  Pipelle passed to 4.5cm.  Suction applied and pipelle removed with scant tissue obtained.  Second  pass performed.  Do not feel I was in the endometrial cavity with this biopsy but due to concerns about perforation and pt discomfort, no additional attempts made today.  Tenculum removed.  No bleeding noted.  Patient tolerated procedure well.  Chaperone was present for exam.  Assessment: PMP bleeding with menopausal range [facility] 6/17 Strong personal risk for breast cancer  Plan: Endometrial biopsy obtained today Repeat [place] obtained today Planning MRI in August.  Pt is aware she will be called for scheduling.  Lifetime risk of breast cancer is 26.5% with Ronelle Nigh.

## 2016-12-18 NOTE — Assessment & Plan Note (Signed)
current exam consistent with supraspinatus strain though she describes possible scapulothoracic bursitis, local nerve root irritation also.  We discussed options - she would like to try prednisone dose pack then start home exercise program.  She will consider physical therapy if not improving.  F/u in 2 weeks for reevaluation.  Call us if any changes in her current symptoms.

## 2016-12-19 LAB — FOLLICLE STIMULATING HORMONE: FSH: 17.3 m[IU]/mL

## 2016-12-23 ENCOUNTER — Telehealth: Payer: Self-pay | Admitting: Obstetrics & Gynecology

## 2016-12-23 NOTE — Telephone Encounter (Signed)
You can let her know the biopsy was normal and showed no abnormal cells but it didn't show any endometrial tissue.  This is due to the history of endometrial ablation.  Her [place] was in normal range as compared to the prior one so I think this was just a regular bleeding episode where she produced some estrogen.  I do not think any additional evaluation is needed at this time but I'd like to know if she has future bleeding episodes.  Thanks.

## 2016-12-23 NOTE — Telephone Encounter (Signed)
Spoke with patient. Advised of message as seen below from Dr.Miller. Patient is agreeable and verbalizes understanding.  Routing to provider for final review. Patient agreeable to disposition. Will close encounter   

## 2016-12-23 NOTE — Telephone Encounter (Signed)
Patient is asking for recent lab results.

## 2016-12-23 NOTE — Telephone Encounter (Signed)
Routing to [place] for review or labs and pathology report from 12/18/2016.

## 2016-12-26 ENCOUNTER — Telehealth: Payer: Self-pay | Admitting: Medical

## 2016-12-26 NOTE — Telephone Encounter (Signed)
Sugars should come down over the next few days.  Is she having sugar >300?

## 2016-12-26 NOTE — Telephone Encounter (Signed)
Notified pt and she voices understanding. States she took last prednisone on Tuesday. FBS today was 168 and usually in the lower 100s. Advised her to see how readings are over the weekend and let us know Monday if they are still elevated. Pt reports that she was feeling nauseated and a little dizzy when BS was elevated. Advised her if symptoms persist over the weekend to let us know and we should bring her in for further evaluation.

## 2016-12-26 NOTE — Telephone Encounter (Signed)
Noted and agree. 

## 2016-12-26 NOTE — Telephone Encounter (Signed)
Caller name: [place] Relation to pt: self  Call back number: [phone] Pharmacy:  Reason for call: Pt states saw the orthopedics for shoulder pain and was given by orthopedicts doctor rx of prednisone- pt states already finish her meds on Tuesday and still is having blood sugar level high, pt would like to know if this is normal and how long does it take it blood sugar level to come back to normal. Please advise.

## 2016-12-29 ENCOUNTER — Encounter: Payer: Self-pay | Admitting: Medical

## 2016-12-29 NOTE — Telephone Encounter (Signed)
Continue to work on diabetic diet.  Check sugars twice daily for the next 1 week then send sugars via mychart or phone call.  Call if sugar 250.

## 2016-12-29 NOTE — Telephone Encounter (Signed)
Notified pt and she voices understanding. 

## 2016-12-29 NOTE — Telephone Encounter (Signed)
Pt was told to call back and speak with Edward's nurse if her blood sugar was still elevated, states her sugar level was 172 this morning, please advise

## 2016-12-30 MED ORDER — METFORMIN HCL ER 500 MG PO TB24: 500.0000 mg | ORAL_TABLET | Freq: Two times a day (BID) | ORAL | 1 refills | Status: DC

## 2016-12-30 NOTE — Telephone Encounter (Signed)
Have her increase her Metformin XR 500 mg po bid and continue to monitor blood sugars closely. Disp #60 with 1 rf til seen

## 2016-12-30 NOTE — Telephone Encounter (Signed)
Metformin increased to bid and Rx sent to [facility]; Pt informed via MyChart.

## 2016-12-31 ENCOUNTER — Ambulatory Visit: Payer: BC Managed Care – PPO | Admitting: Family Medicine

## 2017-01-05 ENCOUNTER — Encounter: Payer: Self-pay | Admitting: Medical

## 2017-01-15 ENCOUNTER — Ambulatory Visit (INDEPENDENT_AMBULATORY_CARE_PROVIDER_SITE_OTHER): Payer: BC Managed Care – PPO | Admitting: Family Medicine

## 2017-01-15 ENCOUNTER — Encounter: Payer: Self-pay | Admitting: Family Medicine

## 2017-01-15 DIAGNOSIS — M25511 Pain in right shoulder: Secondary | ICD-10-CM

## 2017-01-16 NOTE — Progress Notes (Signed)
PCP and consultation requested by: Mackie Pai, PA-C  Subjective:   HPI: Patient is a 46 y.o. female here for right shoulder pain.  5/30: Patient denies known injury or trauma. She feels improved today compared to previous but states about 6 days ago she started to get right shoulder pain. Caused a constant ache. Pain worse with shrugging and reaching across her body. Tried heat, icing, naproxen, topical cream. Heard popping sounds when shrugging also. No prior issues with this shoulder. Pain is 4/10 in lateral shoulder, scapular regions. No skin changes, numbness.  6/28: Patient reports she is doing well. About 60% improved following prednisone, home exercises. Pain level can still get to 4/10 though, dull, achy posteriorly in scapula. Occasionally icing. No skin changes, numbness. No radiation of pain.  Past Medical History:  Diagnosis Date  . Abnormal Pap smear of cervix    h/o colposcopy/biopsy  . Allergy   . Asthma    HX  . Breast cancer ([place]) 2007  . Cancer ([place]) [id number]   ovarian  . Diabetes mellitus without complication ([place]) 34/7425    diagnosed by PCP, [name]   . GERD (gastroesophageal reflux disease)   . Hyperlipemia    on medication     Current Outpatient Prescriptions on File Prior to Visit  Medication Sig Dispense Refill  . aspirin 81 MG tablet Take 81 mg by mouth daily.    [name] EPINEPHrine 0.3 mg/0.3 mL IJ SOAJ injection Inject once into muscle for any allergic reaction to peanut exposure 1 Device 1  . ezetimibe (ZETIA) 10 MG tablet Take 1 tablet (10 mg total) by mouth daily. 30 tablet 3  . glucose blood (ONETOUCH VERIO) test strip Check blood sugar daily as directed 100 each 12  . Lancets (ONETOUCH ULTRASOFT) lancets Check blood sugar daily as directed. 100 each 12  . metFORMIN (GLUCOPHAGE-XR) 500 MG 24 hr tablet Take 1 tablet (500 mg total) by mouth 2 (two) times daily. 60 tablet 1  . Multiple Vitamin (MULTI VITAMIN DAILY) TABS     .  omeprazole (PRILOSEC) 20 MG capsule Take 1 capsule (20 mg total) by mouth daily. 30 capsule 3  . ondansetron (ZOFRAN ODT) 4 MG disintegrating tablet Take 1 tablet (4 mg total) by mouth every 8 (eight) hours as needed for nausea or vomiting. 20 tablet 0  . predniSONE (DELTASONE) 10 MG tablet 6 tabs po day 1, 5 tabs po day 2, 4 tabs po day 3, 3 tabs po day 4, 2 tabs po day 5, 1 tab po day 6 21 tablet 0   No current facility-administered medications on file prior to visit.     Past Surgical History:  Procedure Laterality Date  . APPENDECTOMY    . BREAST LUMPECTOMY     left breast  . CHOLECYSTECTOMY    . ENDOMETRIAL ABLATION     5 yrs ago. No menses since.  [name] LAPAROSCOPIC SALPINGO OOPHERECTOMY Left     with right tubal ligation    Allergies  Allergen Reactions  . Latex Rash    Causes blisters  . Peanut-Containing Drug Products Anaphylaxis  . Sulfa Antibiotics Rash and Shortness Of Breath    Shortness of breath, rash Shortness of breath, rash  . Adhesive [Tape]   . Canagliflozin Other (See Comments)  . Reglan [Metoclopramide] Tinitus  . Ciprofloxacin Rash  . Sulfur Rash    Social History   Social History  . Marital status: Married    Spouse name: N/A  . Number of children:  N/A  . Years of education: N/A   Occupational History  . Not on file.   Social History Main Topics  . Smoking status: Never Smoker  . Smokeless tobacco: Never Used     Comment: NEVER USED TOBACCO  . Alcohol use 1.2 oz/week    2 Standard drinks or equivalent per week  . Drug use: No  . Sexual activity: Yes    Partners: Male    Birth control/ protection: Surgical   Other Topics Concern  . Not on file   Social History Narrative  . No narrative on file    Family History  Problem Relation Age of Onset  . Diabetes Mother   . Colon cancer Father   . Breast cancer Maternal Aunt   . Ovarian cancer Maternal Aunt   . Ovarian cancer Cousin   . Colon cancer Cousin   . Melanoma Cousin   . Breast  cancer Maternal Grandmother   . Cancer Other        breast and ovarian-maternal side    BP 123/88   Pulse 80   Ht 5\' 5"  (1.651 m)   Wt 148 lb (67.1 kg)   BMI 24.63 kg/m   Review of Systems: See HPI above.     Objective:  Physical Exam:  Gen: NAD, comfortable in exam room  Right shoulder: No swelling, ecchymoses.  No gross deformity, no scapular winging. Mild TTP medial to scapula, in trapezius. FROM without painful arc.  No crepitus of scapula on shrug. Negative Hawkins, Neers. Negative Yergasons. Strength 5/5 with empty can and resisted internal/external rotation.  No pain. Negative apprehension. NV intact distally.   Assessment & Plan:  1. Right shoulder pain - Patient still with some posterior pain but clinically improving as I would expect.  She will continue with home exercises.  Again discussed physical therapy if she plateaus or worsens.  Ibuprofen or aleve if needed.  F/u prn otherwise.

## 2017-01-16 NOTE — Assessment & Plan Note (Signed)
Patient still with some posterior pain but clinically improving as I would expect.  She will continue with home exercises.  Again discussed physical therapy if she plateaus or worsens.  Ibuprofen or aleve if needed.  F/u prn otherwise.

## 2017-02-23 ENCOUNTER — Other Ambulatory Visit: Payer: BC Managed Care – PPO

## 2017-02-23 ENCOUNTER — Ambulatory Visit: Payer: BC Managed Care – PPO | Admitting: Hematology & Oncology

## 2017-03-11 ENCOUNTER — Other Ambulatory Visit: Payer: Self-pay | Admitting: *Deleted

## 2017-03-11 ENCOUNTER — Encounter: Payer: Self-pay | Admitting: Medical

## 2017-03-11 ENCOUNTER — Ambulatory Visit (HOSPITAL_BASED_OUTPATIENT_CLINIC_OR_DEPARTMENT_OTHER): Payer: BC Managed Care – PPO | Admitting: Hematology & Oncology

## 2017-03-11 ENCOUNTER — Ambulatory Visit (INDEPENDENT_AMBULATORY_CARE_PROVIDER_SITE_OTHER): Payer: BC Managed Care – PPO | Admitting: Medical

## 2017-03-11 ENCOUNTER — Other Ambulatory Visit (HOSPITAL_BASED_OUTPATIENT_CLINIC_OR_DEPARTMENT_OTHER): Payer: BC Managed Care – PPO

## 2017-03-11 ENCOUNTER — Telehealth: Payer: Self-pay | Admitting: Medical

## 2017-03-11 VITALS — BP 104/75 | HR 91 | Temp 97.8°F | Resp 16 | Wt 152.0 lb

## 2017-03-11 VITALS — BP 116/71 | HR 91 | Temp 98.2°F | Resp 17 | Ht 64.5 in | Wt 152.6 lb

## 2017-03-11 DIAGNOSIS — Z803 Family history of malignant neoplasm of breast: Secondary | ICD-10-CM

## 2017-03-11 DIAGNOSIS — H9201 Otalgia, right ear: Secondary | ICD-10-CM

## 2017-03-11 DIAGNOSIS — E119 Type 2 diabetes mellitus without complications: Secondary | ICD-10-CM

## 2017-03-11 DIAGNOSIS — E785 Hyperlipidemia, unspecified: Secondary | ICD-10-CM

## 2017-03-11 DIAGNOSIS — Z8543 Personal history of malignant neoplasm of ovary: Secondary | ICD-10-CM

## 2017-03-11 DIAGNOSIS — H60311 Diffuse otitis externa, right ear: Secondary | ICD-10-CM

## 2017-03-11 DIAGNOSIS — E559 Vitamin D deficiency, unspecified: Secondary | ICD-10-CM

## 2017-03-11 DIAGNOSIS — Z853 Personal history of malignant neoplasm of breast: Secondary | ICD-10-CM | POA: Diagnosis not present

## 2017-03-11 LAB — CBC WITH DIFFERENTIAL (CANCER CENTER ONLY)
BASO#: 0 10*3/uL (ref 0.0–0.2)
BASO%: 0.5 % (ref 0.0–2.0)
EOS%: 2.1 % (ref 0.0–7.0)
Eosinophils Absolute: 0.1 10*3/uL (ref 0.0–0.5)
HCT: 43.6 % (ref 34.8–46.6)
HEMOGLOBIN: 14.9 g/dL (ref 11.6–15.9)
LYMPH#: 1.7 10*3/uL (ref 0.9–3.3)
LYMPH%: 26.2 % (ref 14.0–48.0)
MCH: 31 pg (ref 26.0–34.0)
MCHC: 34.2 g/dL (ref 32.0–36.0)
MCV: 91 fL (ref 81–101)
MONO#: 0.4 10*3/uL (ref 0.1–0.9)
MONO%: 5.3 % (ref 0.0–13.0)
NEUT%: 65.9 % (ref 39.6–80.0)
NEUTROS ABS: 4.3 10*3/uL (ref 1.5–6.5)
Platelets: 273 10*3/uL (ref 145–400)
RBC: 4.81 10*6/uL (ref 3.70–5.32)
RDW: 12.6 % (ref 11.1–15.7)
WBC: 6.6 10*3/uL (ref 3.9–10.0)

## 2017-03-11 LAB — CMP (CANCER CENTER ONLY)
ALBUMIN: 4 g/dL (ref 3.3–5.5)
ALK PHOS: 65 U/L (ref 26–84)
ALT: 25 U/L (ref 10–47)
AST: 28 U/L (ref 11–38)
BILIRUBIN TOTAL: 0.9 mg/dL (ref 0.20–1.60)
BUN, Bld: 9 mg/dL (ref 7–22)
CO2: 29 mEq/L (ref 18–33)
Calcium: 9.2 mg/dL (ref 8.0–10.3)
Chloride: 104 mEq/L (ref 98–108)
Creat: 1.2 mg/dl (ref 0.6–1.2)
Glucose, Bld: 125 mg/dL — ABNORMAL HIGH (ref 73–118)
Potassium: 4.5 mEq/L (ref 3.3–4.7)
SODIUM: 144 meq/L (ref 128–145)
TOTAL PROTEIN: 7.5 g/dL (ref 6.4–8.1)

## 2017-03-11 MED ORDER — NEOMYCIN-POLYMYXIN-HC 3.5-10000-1 OT SOLN: 3.0000 [drp] | Freq: Three times a day (TID) | OTIC | 0 refills | Status: DC

## 2017-03-11 NOTE — Telephone Encounter (Signed)
Pt seen in office today.

## 2017-03-11 NOTE — Telephone Encounter (Signed)
[place] [phone]   Called in because pt would like to know if PCP need labs from her also to avoid being stuck multiple times? Gabriel Cirri stated that she will take more tubes of the labs that she is collecting if provider needs them . Please advise further.

## 2017-03-11 NOTE — Progress Notes (Signed)
Hematology and Oncology Follow Up Visit  Carolyn Wilson [id number] 03-13-1971 46 y.o. 03/11/2017   Principle Diagnosis:  History of Sertoli-Leydig tumor of the left ovary  Strong family history of breast and ovarian cancer - BRCA 1/2 and p53 negative  Current Therapy:   Observation    Interim History:  Carolyn Wilson is back for follow-up. We last saw her back in February. Since then, she's been doing quite well. She's had a very good summer. They have been to [place]. She enjoyed the [employer].  She is complaining of some pain in the right ear. This may going on for about 3 days. She has no decreased hearing. There is no tinnitus.  She's working. She is going to the local pool. She wears sunscreen and doing very well with this.  She's had no problems with cough. His been no nausea or vomiting. She's had no change in bowel or bladder habits. She's had no rashes or she's had no leg swelling.  Her mom is doing okay. She has an ovarian mass but refuses to have surgery for this. She had pulmonary emboli. She is on Coumadin. She has some heart failure.  Carolyn Wilson does not have any fever. She's had no prior with infections.  She had her mammogram in February. Everything looked fine. She does have some dense breasts.   Her son is doing well. He will graduate college in December.  Her overall, her performance status is ECOG 0.  Medications:  Allergies as of 03/11/2017      Reactions   Latex Rash   Causes blisters   Peanut-containing Drug Products Anaphylaxis   Sulfa Antibiotics Rash, Shortness Of Breath   Shortness of breath, rash Shortness of breath, rash   Adhesive [tape]    Canagliflozin Other (See Comments)   Reglan [metoclopramide] Tinitus   Ciprofloxacin Rash   Sulfur Rash      Medication List       Accurate as of 03/11/17  8:53 AM. Always use your most recent med list.          aspirin 81 MG tablet Take 81 mg by mouth daily.   EPINEPHrine 0.3 mg/0.3 mL  Soaj injection Commonly known as:  EPI-PEN Inject once into muscle for any allergic reaction to peanut exposure   ezetimibe 10 MG tablet Commonly known as:  ZETIA Take 1 tablet (10 mg total) by mouth daily.   glucose blood test strip Commonly known as:  ONETOUCH VERIO Check blood sugar daily as directed   metFORMIN 500 MG 24 hr tablet Commonly known as:  GLUCOPHAGE-XR Take 1 tablet (500 mg total) by mouth 2 (two) times daily.   MULTI VITAMIN DAILY Tabs   omeprazole 20 MG capsule Commonly known as:  PRILOSEC Take 1 capsule (20 mg total) by mouth daily.   ondansetron 4 MG disintegrating tablet Commonly known as:  ZOFRAN ODT Take 1 tablet (4 mg total) by mouth every 8 (eight) hours as needed for nausea or vomiting.   onetouch ultrasoft lancets Check blood sugar daily as directed.   predniSONE 10 MG tablet Commonly known as:  DELTASONE 6 tabs po day 1, 5 tabs po day 2, 4 tabs po day 3, 3 tabs po day 4, 2 tabs po day 5, 1 tab po day 6       Allergies:  Allergies  Allergen Reactions  . Latex Rash    Causes blisters  . Peanut-Containing Drug Products Anaphylaxis  . Sulfa Antibiotics Rash and Shortness Of  Breath    Shortness of breath, rash Shortness of breath, rash  . Adhesive [Tape]   . Canagliflozin Other (See Comments)  . Reglan [Metoclopramide] Tinitus  . Ciprofloxacin Rash  . Sulfur Rash    Past Medical History, Surgical history, Social history, and Family History were reviewed and updated.  Review of Systems: All other 10 point review of systems is negative.   Physical Exam:  vitals were not taken for this visit.  Wt Readings from Last 3 Encounters:  01/15/17 148 lb (67.1 kg)  12/18/16 151 lb (68.5 kg)  12/17/16 148 lb (67.1 kg)    Well-developed and well-nourished white female. Head and neck exam shows no ocular or oral lesions. There are no palpable cervical or supraclavicular lymph nodes. I looked into her ears. She has some slight inflammation of  the right external auditory canal. There is no fluid behind the right tympanic membrane.  Lungs are clear. Cardiac exam regular rate and rhythm with no murmurs, rubs or bruits. Breast exam shows no breast masses bilaterally. She has no bilateral axillary adenopathy. Abdomen is soft. She has good bowel sounds. There is no fluid wave. There is no palpable liver or spleen tip. Extremities shows no clubbing, cyanosis or edema. She has good range of motion of her joints. Back exam shows no kyphosis or osteoporotic changes. Neurological exam shows no focal neurological deficits. Skin exam shows no rashes, ecchymoses or petechia. s.  .   Lab Results  Component Value Date   WBC 6.6 03/11/2017   HGB 14.9 03/11/2017   HCT 43.6 03/11/2017   MCV 91 03/11/2017   PLT 273 03/11/2017   No results found for: FERRITIN, IRON, TIBC, UIBC, IRONPCTSAT Lab Results  Component Value Date   RBC 4.81 03/11/2017   No results found for: KPAFRELGTCHN, LAMBDASER, KAPLAMBRATIO No results found for: Kandis Cocking, IGMSERUM No results found for: Odetta Pink, SPEI   Chemistry      Component Value Date/Time   NA 139 11/25/2016 0841   NA 143 08/25/2016 0741   K 4.3 11/25/2016 0841   K 3.8 08/25/2016 0741   CL 103 11/25/2016 0841   CL 102 08/25/2016 0741   CO2 31 11/25/2016 0841   CO2 29 08/25/2016 0741   BUN 12 11/25/2016 0841   BUN 10 08/25/2016 0741   CREATININE 0.86 11/25/2016 0841   CREATININE 0.9 08/25/2016 0741      Component Value Date/Time   CALCIUM 9.8 11/25/2016 0841   CALCIUM 10.0 08/25/2016 0741   ALKPHOS 58 11/25/2016 0841   ALKPHOS 58 08/25/2016 0741   AST 18 11/25/2016 0841   AST 25 08/25/2016 0741   ALT 20 11/25/2016 0841   ALT 17 08/25/2016 0741   BILITOT 0.6 11/25/2016 0841   BILITOT 0.60 08/25/2016 0741     Impression and Plan: Carolyn Wilson is a 46 yo post-menopausal female with a history of a 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   I will go ahead and order some Cortisporin eardrops. She may have a little bit of otitis externa. The Cortisporin will help.  I don't see any issues with respect to recurrence of the Sertoli-Leydig tumor.  I did her breast exam. I cannot find anything on the breast exam..   We will get her back in 6 months.  Volanda Napoleon, MD 8/22/20188:53 AM

## 2017-03-11 NOTE — Patient Instructions (Addendum)
For your diabetes added a1c and notified Dr. Marin Olp office to run.  For high cholesterol will follow labs drawn by Dr. Marin Olp office. Will see if zetia helped bring down levels.  For hx of low vit d follow labs as well.  Regarding your rt ear canal inflammation. See if ear drops Dr. Marin Olp wrote you helps. If by Friday worse pain let me know and would rx oral antibiotic.  Follow up date to be determined. Likely 3 months.

## 2017-03-11 NOTE — Progress Notes (Signed)
Subjective:    Patient ID: Carolyn Wilson, female    DOB: [birth year], 46 y.o.   MRN: [id number]  HPI  Pt in for follow up.   Pt had vacation in [place] 3 weeks ago.  Pt cmp and cbc by Dr. Marin Olp. Pt also had vit d and lipid panel drawn.  Pt states her sugars have been controlled most of the time. But when she was on prednisone her sugars did increase. She was on prednisone for 8 days for shoulder pain. She states 3 weeks after prednisone stopped sugars normalized. Now mostly low 100's.  History of low vit d. So Dr. Marin Olp put order in.  Pt has high cholesterol. Pt started zetia. But did not try krill oil.   Review of Systems  Constitutional: Negative for chills, fatigue and fever.  HENT: Positive for ear pain. Negative for congestion, hearing loss, nosebleeds, postnasal drip, rhinorrhea, sinus pressure, sneezing, sore throat and voice change.   Respiratory: Negative for cough, chest tightness, shortness of breath and wheezing.   Cardiovascular: Negative for chest pain and palpitations.  Gastrointestinal: Negative for abdominal pain, blood in stool and nausea.  Genitourinary: Negative for difficulty urinating, dysuria, flank pain, frequency, vaginal bleeding and vaginal discharge.  Musculoskeletal: Negative for back pain.  Skin: Negative for pallor and rash.  Neurological: Negative for dizziness.  Hematological: Negative for adenopathy. Does not bruise/bleed easily.  Psychiatric/Behavioral: Negative for behavioral problems, confusion, sleep disturbance and suicidal ideas. The patient is not nervous/anxious.     Past Medical History:  Diagnosis Date  . Abnormal Pap smear of cervix    h/o colposcopy/biopsy  . Allergy   . Asthma    HX  . Breast cancer ([place]) 2007  . Cancer ([place]) [id number]   ovarian  . Diabetes mellitus without complication ([place]) 81/0175    diagnosed by PCP, [name]   . GERD (gastroesophageal reflux disease)   . Hyperlipemia    on medication      Social History   Social History  . Marital status: Married    Spouse name: N/A  . Number of children: N/A  . Years of education: N/A   Occupational History  . Not on file.   Social History Main Topics  . Smoking status: Never Smoker  . Smokeless tobacco: Never Used     Comment: NEVER USED TOBACCO  . Alcohol use 1.2 oz/week    2 Standard drinks or equivalent per week  . Drug use: No  . Sexual activity: Yes    Partners: Male    Birth control/ protection: Surgical   Other Topics Concern  . Not on file   Social History Narrative  . No narrative on file    Past Surgical History:  Procedure Laterality Date  . APPENDECTOMY    . BREAST LUMPECTOMY     left breast  . CHOLECYSTECTOMY    . ENDOMETRIAL ABLATION     5 yrs ago. No menses since.  [name] LAPAROSCOPIC SALPINGO OOPHERECTOMY Left     with right tubal ligation    Family History  Problem Relation Age of Onset  . Diabetes Mother   . Colon cancer Father   . Breast cancer Maternal Aunt   . Ovarian cancer Maternal Aunt   . Ovarian cancer Cousin   . Colon cancer Cousin   . Melanoma Cousin   . Breast cancer Maternal Grandmother   . Cancer Other        breast and ovarian-maternal side    Allergies  Allergen Reactions  . Latex Rash    Causes blisters  . Peanut-Containing Drug Products Anaphylaxis  . Sulfa Antibiotics Rash and Shortness Of Breath    Shortness of breath, rash Shortness of breath, rash  . Adhesive [Tape]   . Canagliflozin Other (See Comments)  . Reglan [Metoclopramide] Tinitus  . Ciprofloxacin Rash  . Sulfur Rash    Current Outpatient Prescriptions on File Prior to Visit  Medication Sig Dispense Refill  . aspirin 81 MG tablet Take 81 mg by mouth daily.    [name] EPINEPHrine 0.3 mg/0.3 mL IJ SOAJ injection Inject once into muscle for any allergic reaction to peanut exposure 1 Device 1  . ezetimibe (ZETIA) 10 MG tablet Take 1 tablet (10 mg total) by mouth daily. 30 tablet 3  . glucose blood (ONETOUCH  VERIO) test strip Check blood sugar daily as directed 100 each 12  . Lancets (ONETOUCH ULTRASOFT) lancets Check blood sugar daily as directed. 100 each 12  . metFORMIN (GLUCOPHAGE-XR) 500 MG 24 hr tablet Take 1 tablet (500 mg total) by mouth 2 (two) times daily. 60 tablet 1  . Multiple Vitamin (MULTI VITAMIN DAILY) TABS     . neomycin-polymyxin-hydrocortisone (CORTISPORIN) OTIC solution Place 3 drops into the right ear 3 (three) times daily. 10 mL 0  . omeprazole (PRILOSEC) 20 MG capsule Take 1 capsule (20 mg total) by mouth daily. 30 capsule 3  . ondansetron (ZOFRAN ODT) 4 MG disintegrating tablet Take 1 tablet (4 mg total) by mouth every 8 (eight) hours as needed for nausea or vomiting. 20 tablet 0  . predniSONE (DELTASONE) 10 MG tablet 6 tabs po day 1, 5 tabs po day 2, 4 tabs po day 3, 3 tabs po day 4, 2 tabs po day 5, 1 tab po day 6 (Patient not taking: Reported on 03/11/2017) 21 tablet 0   No current facility-administered medications on file prior to visit.     BP 116/71 (BP Location: Left Arm, Patient Position: Sitting, Cuff Size: Normal)   Pulse 91   Temp 98.2 F (36.8 C) (Oral)   Resp 17   Ht 5' 4.5" (1.638 m)   Wt 152 lb 9.6 oz (69.2 kg)   SpO2 100%   BMI 25.79 kg/m       Objective:   Physical Exam  General Mental Status- Alert. General Appearance- Not in acute distress.   Skin General: Color- Normal Color. Moisture- Normal Moisture.  Neck Carotid Arteries- Normal color. Moisture- Normal Moisture. No carotid bruits. No JVD.  Chest and Lung Exam Auscultation: Breath Sounds:-Normal.  Cardiovascular Auscultation:Rythm- Regular. Murmurs & Other Heart Sounds:Auscultation of the heart reveals- No Murmurs.  Abdomen Inspection:-Inspeection Normal. Palpation/Percussion:Note:No mass. Palpation and Percussion of the abdomen reveal- Non Tender, Non Distended + BS, no rebound or guarding.  Neurologic Cranial Nerve exam:- CN III-XII intact(No nystagmus), symmetric  smile. Strength:- 5/5 equal and symmetric strength both upper and lower extremities.  Feet see quality metrics.  .Ear Auditory Canal - Left- Normal. Right - mild swollen. Tympanic Membrane- Left- Normal. Right- Normal. Eye Sclera/Conjunctiva- Left- Normal. Right- Normal. Nose & Sinuses Nasal Mucosa- Left-  Boggy and Congested. Right-  Boggy and  Congested.Bilateral maxillary and frontal sinus pressure. Mouth & Throat Lips: Upper Lip- Normal: no dryness, cracking, pallor, cyanosis, or vesicular eruption. Lower Lip-Normal: no dryness, cracking, pallor, cyanosis or vesicular eruption. Buccal Mucosa- Bilateral- No Aphthous ulcers. Oropharynx- No Discharge or Erythema. Tonsils: Characteristics- Bilateral- No Erythema or Congestion. Size/Enlargement- Bilateral- No enlargement. Discharge- bilateral-None.  Assessment & Plan:  For your diabetes added a1c and notified Dr. Marin Olp office to run.  For high cholesterol will follow labs drawn by Dr. Marin Olp office. Will see if zetia helped bring down levels.  For hx of low vit d follow labs as well.  Regarding your rt ear canal inflammation. See if ear drops Dr. Marin Olp wrote you helps. If by Friday worse pain let me know and would rx oral antibiotic.  Follow up date to be determined. Likely 3 months.   [place] Winnick, Percell Miller, PA-C

## 2017-03-12 ENCOUNTER — Other Ambulatory Visit: Payer: Self-pay

## 2017-03-12 ENCOUNTER — Encounter: Payer: Self-pay | Admitting: Medical

## 2017-03-12 LAB — VITAMIN D 25 HYDROXY (VIT D DEFICIENCY, FRACTURES): Vitamin D, 25-Hydroxy: 41.4 ng/mL (ref 30.0–100.0)

## 2017-03-12 LAB — HEMOGLOBIN A1C
ESTIMATED AVERAGE GLUCOSE: 131 mg/dL
HEMOGLOBIN A1C: 6.2 % — AB (ref 4.8–5.6)

## 2017-03-12 LAB — LIPID PANEL
CHOLESTEROL TOTAL: 185 mg/dL (ref 100–199)
Chol/HDL Ratio: 3.9 ratio (ref 0.0–4.4)
HDL: 48 mg/dL (ref >39)
LDL CALC: 108 mg/dL — AB (ref 0–99)
TRIGLYCERIDES: 145 mg/dL (ref 0–149)
VLDL Cholesterol Cal: 29 mg/dL (ref 5–40)

## 2017-03-12 MED ORDER — EZETIMIBE 10 MG PO TABS: 10.0000 mg | ORAL_TABLET | Freq: Every day | ORAL | 3 refills | Status: DC

## 2017-03-12 MED ORDER — METFORMIN HCL ER 500 MG PO TB24: 500.0000 mg | ORAL_TABLET | Freq: Two times a day (BID) | ORAL | 1 refills | Status: DC

## 2017-03-13 ENCOUNTER — Encounter: Payer: Self-pay | Admitting: *Deleted

## 2017-03-17 MED ORDER — OMEPRAZOLE 20 MG PO CPDR: 20.0000 mg | DELAYED_RELEASE_CAPSULE | Freq: Every day | ORAL | 5 refills | Status: DC

## 2017-03-17 MED ORDER — EZETIMIBE 10 MG PO TABS: 10.0000 mg | ORAL_TABLET | Freq: Every day | ORAL | 5 refills | Status: DC

## 2017-03-17 MED ORDER — METFORMIN HCL ER 500 MG PO TB24: 500.0000 mg | ORAL_TABLET | Freq: Two times a day (BID) | ORAL | 5 refills | Status: DC

## 2017-03-18 ENCOUNTER — Encounter: Payer: Self-pay | Admitting: Medical

## 2017-03-20 ENCOUNTER — Telehealth: Payer: Self-pay

## 2017-03-20 DIAGNOSIS — Z9189 Other specified personal risk factors, not elsewhere classified: Secondary | ICD-10-CM

## 2017-03-20 NOTE — Telephone Encounter (Signed)
Patient returning your call.

## 2017-03-20 NOTE — Telephone Encounter (Signed)
Spoke with patient. Patient states that she would like to proceed with breast MRI with [facility] or [facility].  Call to [place]. Bilateral breast MRI w wo contrast scheduled for 03/26/2017 at 10:15 AM at [facility] on [address], [place]. Patient is agreeable to date and time. Paper order to [place] for review and signature to fax to [phone].  Routing to [employer] for pre-authorization.

## 2017-03-20 NOTE — Telephone Encounter (Signed)
Left message to call Krayton Wortley at [phone]. 

## 2017-03-20 NOTE — Telephone Encounter (Signed)
Left message to call Kaitlyn at [phone]. 

## 2017-03-20 NOTE — Telephone Encounter (Signed)
-----   Message from Megan Salon, MD sent at 03/17/2017  9:11 AM EDT ----- Regarding: precert and schedule MRI Pt has lifetime risk of breat cancer of 26.5%.   Doing yearly MRI and it is time to schedule.  She has a bad experience with the breast center so this may need to be scheduled with [place] [facility] or [facility].  Please check with her.    CC:  Suzy for precert.  Thanks.

## 2017-03-25 NOTE — Telephone Encounter (Signed)
Spoke with Leanne from [place] who states pre-authorization is required for bilateral breast MRI w wo contrast that is scheduled for 03/26/2017. Advised will review with insurance/billing department and return call. Telephone number to reach Leanne is [phone].

## 2017-03-26 NOTE — Telephone Encounter (Signed)
Patient placed in imaging hold.  Routing to provider for final review. Patient agreeable to disposition. Will close encounter.

## 2017-03-26 NOTE — Telephone Encounter (Signed)
Authorization for the Bilateral Breast MRI w/o Contrast, has been approved by [employer].  The authorization number is [id number], which is valid 03/26/17 through 04/24/17. This information has been conveyed to [place] with [facility].   cc: Dr [name] Heck  cc: Reesa Chew, RN

## 2017-04-16 ENCOUNTER — Telehealth: Payer: Self-pay | Admitting: Obstetrics & Gynecology

## 2017-04-16 NOTE — Telephone Encounter (Signed)
Patient calling to check if results have been received from her MRI of the breast.

## 2017-04-16 NOTE — Telephone Encounter (Signed)
Breast MRI report reviewed.  She has no findings suspicious for malignancy.  She has bilateral breast cysts that are benign.  She has sone still present in the right breast that they are following and is unchanged.   The radiologist is recommending yearly MRI of the breast and yearly mammogram and alternating every 6 months.   Cc- Dr. [name] Heck

## 2017-04-16 NOTE — Telephone Encounter (Signed)
MRI Results to covering provider for review.  Dr. Quincy Simmonds, please review and advise?  Cc: Dr. [name] Heck

## 2017-04-16 NOTE — Telephone Encounter (Signed)
Spoke with Joycelyn Schmid at [facility]. Will fax breast MRI report dated 04/13/17 to [phone].

## 2017-04-16 NOTE — Telephone Encounter (Signed)
Spoke with patient. Advised MRI report has not been received, called and requested from [facility]. Advised patient Dr. [name] Heck is out of the office, once report has been reviewed and reviewed by covering provider, will return call. Patient verbalizes understanding and is agreeable.

## 2017-04-16 NOTE — Telephone Encounter (Signed)
Spoke with patient, advised as seen below per Dr. Quincy Simmonds. Patient verbalizes understanding and is agreeable. Will close encounter.

## 2017-04-27 ENCOUNTER — Telehealth: Payer: Self-pay | Admitting: Obstetrics & Gynecology

## 2017-04-27 DIAGNOSIS — N926 Irregular menstruation, unspecified: Secondary | ICD-10-CM

## 2017-04-27 NOTE — Telephone Encounter (Signed)
Patient called and states she was to call us if she started bleeding again and she has.

## 2017-04-27 NOTE — Telephone Encounter (Signed)
Please have pt come for PUS and repeat FSH.  Thanks.

## 2017-04-27 NOTE — Telephone Encounter (Signed)
Spoke with patient. Reports spotting old blood on 04/26/17, denies any other symptoms. History of ablation. Patient was advised to return call for any future bleeding.   Last FSH 17.3 on 12/18/16; 67.8 on 12/27/15.  Last PUS 12/2014.  EMB 12/18/16 -no endometrial tissue obtained, ECC benign.  Advised patient would review with Dr. [name] Heck and return call with recommendations, patient is agreeable.  Dr. [name] Heck- please review and advise?

## 2017-04-27 NOTE — Telephone Encounter (Signed)
Left message to call Stayce Delancy at [phone].  

## 2017-04-27 NOTE — Telephone Encounter (Signed)
Spoke with patient, advised as seen below per Dr. [name] Heck. Patient declined appointment for 10/11, 10/16 and 10/18. Patient scheduled for PUS on 10/23 at 2:30pm, with consult to follow at 3pm with Dr. [name] Heck. Order placed. Patient verbalizes understanding and is agreeable.  Routing to provider for final review. Patient is agreeable to disposition. Will close encounter.  Cc: Lerry Liner

## 2017-05-05 ENCOUNTER — Telehealth: Payer: Self-pay | Admitting: Medical

## 2017-05-05 ENCOUNTER — Encounter: Payer: Self-pay | Admitting: Obstetrics & Gynecology

## 2017-05-05 DIAGNOSIS — N6001 Solitary cyst of right breast: Secondary | ICD-10-CM

## 2017-05-05 DIAGNOSIS — Z803 Family history of malignant neoplasm of breast: Secondary | ICD-10-CM

## 2017-05-05 DIAGNOSIS — Z9189 Other specified personal risk factors, not elsewhere classified: Secondary | ICD-10-CM

## 2017-05-05 MED ORDER — ONDANSETRON 4 MG PO TBDP: 4.0000 mg | ORAL_TABLET | Freq: Three times a day (TID) | ORAL | 1 refills | Status: DC | PRN

## 2017-05-05 NOTE — Telephone Encounter (Signed)
Would you change pt pharmacy to [facility].Take out her mail order pharmacy. She has not used that in more than a year.

## 2017-05-05 NOTE — Telephone Encounter (Signed)
rx zofran sent to pharmacy

## 2017-05-06 NOTE — Telephone Encounter (Signed)
Done

## 2017-05-11 ENCOUNTER — Other Ambulatory Visit: Payer: Self-pay

## 2017-05-11 MED ORDER — ONDANSETRON 4 MG PO TBDP: 4.0000 mg | ORAL_TABLET | Freq: Three times a day (TID) | ORAL | 1 refills | Status: DC | PRN

## 2017-05-12 ENCOUNTER — Encounter: Payer: Self-pay | Admitting: Obstetrics & Gynecology

## 2017-05-12 ENCOUNTER — Ambulatory Visit (INDEPENDENT_AMBULATORY_CARE_PROVIDER_SITE_OTHER): Payer: BC Managed Care – PPO

## 2017-05-12 ENCOUNTER — Ambulatory Visit (INDEPENDENT_AMBULATORY_CARE_PROVIDER_SITE_OTHER): Payer: BC Managed Care – PPO | Admitting: Obstetrics & Gynecology

## 2017-05-12 VITALS — BP 126/72 | HR 84 | Resp 12 | Wt 159.0 lb

## 2017-05-12 DIAGNOSIS — N926 Irregular menstruation, unspecified: Secondary | ICD-10-CM | POA: Diagnosis not present

## 2017-05-12 DIAGNOSIS — N924 Excessive bleeding in the premenopausal period: Secondary | ICD-10-CM

## 2017-05-12 NOTE — Progress Notes (Signed)
46 y.o. [id number] Married Caucasian female here for pelvic ultrasound due to perimenopausal bleeding.  Pt has undergone an endometrial ablation and endometrial biopsy has been attempted in the past without success.  [place] has fluctuated in the last year with [facility] 67 about a year ago and 17 about 4 months ago.  Denies hot flashes or night sweats.  Denies vaginal dryness   Just have breast MRI done.  Was negative.  Planning MMG in six months.    Patient's last menstrual period was 04/27/2017.  Contraception: BLT  Findings:  UTERUS: 6.5 x 4.4 x 23.4cm EMS:3.37mm ADNEXA: Left ovary: surgically absent       Right ovary: 2.5 x 1.8 x 1.47m CUL DE SAC: no free fluid  Discussion:  Findings discussed.  Will repeat [place] today.  If still low, do not feel pt needs repeat PUS unless she has new symptoms or once she is fully menopausal and if she has additional bleeding.    Assessment:  Perimenopausal bleeding  Plan:  Follow up for AEX as scheduled  ~15 minutes spent with patient >50% of time was in face to face discussion of above.

## 2017-05-13 ENCOUNTER — Encounter: Payer: Self-pay | Admitting: Obstetrics & Gynecology

## 2017-05-13 LAB — FOLLICLE STIMULATING HORMONE: FSH: 15.9 m[IU]/mL

## 2017-05-18 ENCOUNTER — Ambulatory Visit: Payer: BLUE CROSS/BLUE SHIELD | Admitting: Obstetrics & Gynecology

## 2017-05-18 ENCOUNTER — Telehealth: Payer: Self-pay | Admitting: *Deleted

## 2017-05-18 NOTE — Telephone Encounter (Signed)
-----   Message from Megan Salon, MD sent at 05/17/2017  9:54 PM EDT ----- Regarding: appts with Dr. Sande Brothers, Will you please let this pt know Dr. Marin Olp still wants to see her yearly but that is all.  She has an appt scheduled for 2/19 so she should keep that.  Ok to make phone note.  Thanks.  Vinnie Level

## 2017-05-18 NOTE — Telephone Encounter (Signed)
Call to patient. Message given to patient as seen below from Dr. [name] Heck. Patient verbalized understanding.   Patient agreeable to disposition. Will close encounter.

## 2017-06-09 ENCOUNTER — Ambulatory Visit: Payer: BC Managed Care – PPO | Admitting: Obstetrics & Gynecology

## 2017-06-23 ENCOUNTER — Encounter: Payer: Self-pay | Admitting: Medical

## 2017-06-24 ENCOUNTER — Telehealth: Payer: Self-pay | Admitting: Medical

## 2017-06-24 MED ORDER — METFORMIN HCL ER 500 MG PO TB24: ORAL_TABLET | ORAL | 3 refills | Status: DC

## 2017-06-24 NOTE — Telephone Encounter (Signed)
Call patient and advise sent in Metformin with higher dosing.  Explained to her to gradually increase adding 1 tablet every 10-14 days.  In order to minimize side effects.  She would max out at 2 tablets twice daily.

## 2017-07-27 ENCOUNTER — Ambulatory Visit: Payer: BC Managed Care – PPO | Admitting: Obstetrics & Gynecology

## 2017-07-27 ENCOUNTER — Other Ambulatory Visit (HOSPITAL_COMMUNITY)
Admission: RE | Admit: 2017-07-27 | Discharge: 2017-07-27 | Disposition: A | Discharge status: Home / Self Care | Payer: BC Managed Care – PPO | Source: Ambulatory Visit | Attending: Obstetrics & Gynecology | Admitting: Obstetrics & Gynecology

## 2017-07-27 ENCOUNTER — Encounter: Payer: Self-pay | Admitting: Obstetrics & Gynecology

## 2017-07-27 VITALS — BP 130/70 | HR 96 | Resp 16 | Ht 64.5 in | Wt 156.8 lb

## 2017-07-27 DIAGNOSIS — Z01419 Encounter for gynecological examination (general) (routine) without abnormal findings: Secondary | ICD-10-CM | POA: Diagnosis not present

## 2017-07-27 DIAGNOSIS — R000 Tachycardia, unspecified: Secondary | ICD-10-CM

## 2017-07-27 DIAGNOSIS — Z124 Encounter for screening for malignant neoplasm of cervix: Secondary | ICD-10-CM | POA: Diagnosis not present

## 2017-07-27 NOTE — Progress Notes (Signed)
47 y.o. [id number] MarriedCaucasianF here for annual exam.  Has had one additional episode of spotting.  No pelvic pain.  Doing well.       Reports episode of elevated heart rate up to 160 while exercising with trainer.  Was doing stairs but she typically does stairs and never has heart rate about 130.  At same time, has some chest cramping.  This resolved with rest and she has exercised since (not at vigorously).  Trainer recommended assessment.  Pt asks for my suggestions.  PCP:  Hattie Perch.  Blood work was in September.    No LMP recorded. Patient has had an ablation.          Sexually active: Yes.    The current method of family planning is tubal ligation.    Exercising: Yes.    cardio, weights Smoker:  no  Health Maintenance: Pap:  11/09/14 Neg  History of abnormal Pap:  yes MMG:  04/13/17 MRI Breast Bilateral BIRADS2:benign  Colonoscopy: 4/17 polyps. F/u 5 years.  Has internal hemorrhoids.  Considering banding.   BMD:   Never TDaP:  2016 Pneumonia vaccine(s):  2013 Shingrix:   No Hep C testing: N/A Screening Labs: PCP   reports that  has never smoked. she has never used smokeless tobacco. She reports that she drinks about 1.2 oz of alcohol per week. She reports that she does not use drugs.  Past Medical History:  Diagnosis Date  . Abnormal Pap smear of cervix    h/o colposcopy/biopsy  . Allergy   . Asthma    HX  . Breast cancer ([place]) 2007  . Cancer ([place]) [id number]   ovarian  . Diabetes mellitus without complication ([place]) 43/3295    diagnosed by PCP, [name]   . GERD (gastroesophageal reflux disease)   . Hyperlipemia    on medication     Past Surgical History:  Procedure Laterality Date  . APPENDECTOMY     age [age]  . BREAST LUMPECTOMY Left 2008   left breast  . CHOLECYSTECTOMY  2000  . ENDOMETRIAL ABLATION  2009  . LAPAROSCOPIC SALPINGO OOPHERECTOMY Left 1996   with right tubal ligation  . TUBAL LIGATION      Current Outpatient Medications  Medication  Sig Dispense Refill  . aspirin 81 MG tablet Take 81 mg by mouth daily.    [name] ezetimibe (ZETIA) 10 MG tablet Take 1 tablet (10 mg total) by mouth daily. 30 tablet 5  . glucose blood (ONETOUCH VERIO) test strip Check blood sugar daily as directed 100 each 12  . Lancets (ONETOUCH ULTRASOFT) lancets Check blood sugar daily as directed. 100 each 12  . metFORMIN (GLUCOPHAGE-XR) 500 MG 24 hr tablet Take 1,000 mg by mouth daily.    . Multiple Vitamin (MULTI VITAMIN DAILY) TABS     . omeprazole (PRILOSEC) 20 MG capsule Take 1 capsule (20 mg total) by mouth daily. 30 capsule 5  . ondansetron (ZOFRAN ODT) 4 MG disintegrating tablet Take 1 tablet (4 mg total) by mouth every 8 (eight) hours as needed for nausea or vomiting. 18 tablet 1  . EPINEPHrine 0.3 mg/0.3 mL IJ SOAJ injection Inject once into muscle for any allergic reaction to peanut exposure (Patient not taking: Reported on 07/27/2017) 1 Device 1   No current facility-administered medications for this visit.     Family History  Problem Relation Age of Onset  . Diabetes Mother   . Colon cancer Father   . Breast cancer Maternal Aunt   .  Ovarian cancer Maternal Aunt   . Ovarian cancer Cousin   . Colon cancer Cousin   . Melanoma Cousin   . Breast cancer Maternal Grandmother   . Cancer Other        breast and ovarian-maternal side    ROS:  Pertinent items are noted in HPI.  Otherwise, a comprehensive ROS was negative.  Exam:   BP 130/70 (BP Location: Right Arm, Patient Position: Sitting, Cuff Size: Normal)   Pulse 96   Resp 16   Ht 5' 4.5" (1.638 m)   Wt 156 lb 12.8 oz (71.1 kg)   BMI 26.50 kg/m   Height: 5' 4.5" (163.8 cm)  Ht Readings from Last 3 Encounters:  07/27/17 5' 4.5" (1.638 m)  03/11/17 5' 4.5" (1.638 m)  01/15/17 '5\' 5"'  (1.651 m)    General appearance: alert, cooperative and appears stated age Head: Normocephalic, without obvious abnormality, atraumatic Neck: no adenopathy, supple, symmetrical, trachea midline and thyroid  normal to inspection and palpation Lungs: clear to auscultation bilaterally Breasts: normal appearance, no masses or tenderness Heart: regular rate and rhythm Abdomen: soft, non-tender; bowel sounds normal; no masses,  no organomegaly Extremities: extremities normal, atraumatic, no cyanosis or edema Skin: Skin color, texture, turgor normal. No rashes or lesions Lymph nodes: Cervical, supraclavicular, and axillary nodes normal. No abnormal inguinal nodes palpated Neurologic: Grossly normal   Pelvic: External genitalia:  no lesions              Urethra:  normal appearing urethra with no masses, tenderness or lesions              Bartholins and Skenes: normal                 Vagina: normal appearing vagina with normal color and discharge, no lesions              Cervix: no lesions              Pap taken: Yes.   Bimanual Exam:  Uterus:  normal size, contour, position, consistency, mobility, non-tender              Adnexa: normal adnexa and no mass, fullness, tenderness               Rectovaginal: Confirms               Anus:  normal sphincter tone, no lesions  Chaperone was present for exam.  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  MRI due 9/19. Plan PUS 10/19.   Pap smear and HR obtained today. Lab work done with [employer] 8/18 Referral to cardiology due to elevated heart rate with recent training Return annually or prn

## 2017-07-27 NOTE — Progress Notes (Signed)
Patient scheduled while in office for screening MMG at [facility] [place] location, on 09/10/17 at 7:30am. Fax written order to [phone]. Patient is agreeable to date and time.   Written order to Dr. [name] Heck for signature.

## 2017-07-29 LAB — CYTOLOGY - PAP
DIAGNOSIS: NEGATIVE
HPV (WINDOPATH): NOT DETECTED

## 2017-08-05 ENCOUNTER — Other Ambulatory Visit: Payer: Self-pay

## 2017-08-05 ENCOUNTER — Encounter: Payer: Self-pay | Admitting: Cardiology

## 2017-08-05 ENCOUNTER — Ambulatory Visit: Payer: BC Managed Care – PPO | Admitting: Cardiology

## 2017-08-05 DIAGNOSIS — R000 Tachycardia, unspecified: Secondary | ICD-10-CM

## 2017-08-05 HISTORY — DX: Tachycardia, unspecified: R00.0

## 2017-08-05 NOTE — Patient Instructions (Signed)
Medication Instructions:  Your physician recommends that you continue on your current medications as directed. Please refer to the Current Medication list given to you today.  Labwork: Your physician recommends that you have the following labs drawn: troponin  Testing/Procedures: Your physician has requested that you have an echocardiogram. Echocardiography is a painless test that uses sound waves to create images of your heart. It provides your doctor with information about the size and shape of your heart and how well your heart's chambers and valves are working. This procedure takes approximately one hour. There are no restrictions for this procedure.  Follow-Up: Your physician recommends that you schedule a follow-up appointment in: 3 weeks  Any Other Special Instructions Will Be Listed Below (If Applicable).     If you need a refill on your cardiac medications before your next appointment, please call your pharmacy.   [place], RN, BSN   Echocardiogram An echocardiogram, or echocardiography, uses sound waves (ultrasound) to produce an image of your heart. The echocardiogram is simple, painless, obtained within a short period of time, and offers valuable information to your health care provider. The images from an echocardiogram can provide information such as:  Evidence of coronary artery disease (CAD).  Heart size.  Heart muscle function.  Heart valve function.  Aneurysm detection.  Evidence of a past heart attack.  Fluid buildup around the heart.  Heart muscle thickening.  Assess heart valve function.  Tell a health care provider about:  Any allergies you have.  All medicines you are taking, including vitamins, herbs, eye drops, creams, and over-the-counter medicines.  Any problems you or family members have had with anesthetic medicines.  Any blood disorders you have.  Any surgeries you have had.  Any medical conditions you  have.  Whether you are pregnant or may be pregnant. What happens before the procedure? No special preparation is needed. Eat and drink normally. What happens during the procedure?  In order to produce an image of your heart, gel will be applied to your chest and a wand-like tool (transducer) will be moved over your chest. The gel will help transmit the sound waves from the transducer. The sound waves will harmlessly bounce off your heart to allow the heart images to be captured in real-time motion. These images will then be recorded.  You may need an IV to receive a medicine that improves the quality of the pictures. What happens after the procedure? You may return to your normal schedule including diet, activities, and medicines, unless your health care provider tells you otherwise. This information is not intended to replace advice given to you by your health care provider. Make sure you discuss any questions you have with your health care provider. Document Released: 07/04/2000 Document Revised: 02/23/2016 Document Reviewed: 03/14/2013 Elsevier Interactive Patient Education  2017 [employer].

## 2017-08-05 NOTE — Progress Notes (Signed)
Cardiology Office Note:    Date:  08/05/2017   ID:  [name], DOB 20-Nov-1970, MRN [id number]  PCP:  Mackie Pai, PA-C  Cardiologist:  Shirlee More, MD   Referring MD: Megan Salon, MD  ASSESSMENT:    1. Tachycardia    PLAN:    In order of problems listed above:  1. I suspect the episode was overtraining and peak heart rate which is at increased cardiovascular risk with type 2 diabetes and cancer will undergo further evaluation including a troponin assay today and stress echocardiogram.  Next appointment 3 weeks   Medication Adjustments/Labs and Tests Ordered: Current medicines are reviewed at length with the patient today.  Concerns regarding medicines are outlined above.  Orders Placed This Encounter  Procedures  . Troponin I  . EKG 12-Lead  . ECHOCARDIOGRAM STRESS TEST   No orders of the defined types were placed in this encounter.    Chief Complaint  Patient presents with  . Tachycardia    rapid HR during exercise    History of Present Illness:    Carolyn Wilson is a 47 y.o. female with T2DM, breast cancer  and Sertoli-Leydig tumor who is being seen today for the evaluation of rapid HR up to 160 while exercising  at the request of Megan Salon, MD.  She has type 2 diabetes very well controlled with metformin and an optical  lifestyle.  She does intense exercise including crosstraining at very high workloads.  On one occasion her Fitbit showed a heart rate of 168 and she stopped exercise at the advice of a personal trainer.  She continues to exercise but at a lower level.  She has had no exertional chest pain palpitation or recent syncope.  As an adolescent she participated in cross-country had one episode of exertional syncope and had symptomatic PVCs.  The day of the event she had no awareness of her heart. Since then she has been somewhat concerned perhaps apprehensive and has had a vague discomfort in the left chest not exertional in nature  not relieved with rest not severe.  Her EKG is normal we will check a troponin today and she will undergo evaluation including a stress echocardiogram with her risk factors of cancer and type 2 diabetes. Past Medical History:  Diagnosis Date  . Abnormal Pap smear of cervix    h/o colposcopy/biopsy  . Allergy   . Asthma    HX  . Breast cancer ([place]) 2007  . Cancer ([place]) [id number]   ovarian  . Diabetes mellitus without complication ([place]) 36/6440    diagnosed by PCP, [name]   . GERD (gastroesophageal reflux disease)   . Hyperlipemia    on medication     Past Surgical History:  Procedure Laterality Date  . APPENDECTOMY     age [age]  . BREAST LUMPECTOMY Left 2008   left breast  . CHOLECYSTECTOMY  2000  . ENDOMETRIAL ABLATION  2009  . LAPAROSCOPIC SALPINGO OOPHERECTOMY Left 1996   with right tubal ligation  . TUBAL LIGATION      Current Medications: Current Meds  Medication Sig  . aspirin 81 MG tablet Take 81 mg by mouth daily.  [name] EPINEPHrine 0.3 mg/0.3 mL IJ SOAJ injection Inject once into muscle for any allergic reaction to peanut exposure  . ezetimibe (ZETIA) 10 MG tablet Take 1 tablet (10 mg total) by mouth daily.  [name] glucose blood (ONETOUCH VERIO) test strip Check blood sugar daily as directed  .  Lancets (ONETOUCH ULTRASOFT) lancets Check blood sugar daily as directed.  . metFORMIN (GLUCOPHAGE-XR) 500 MG 24 hr tablet Take 1,000 mg by mouth daily.  . Multiple Vitamin (MULTI VITAMIN DAILY) TABS   . omeprazole (PRILOSEC) 20 MG capsule Take 1 capsule (20 mg total) by mouth daily.  . ondansetron (ZOFRAN ODT) 4 MG disintegrating tablet Take 1 tablet (4 mg total) by mouth every 8 (eight) hours as needed for nausea or vomiting.     Allergies:   Latex; Peanut-containing drug products; Sulfa antibiotics; Adhesive [tape]; Canagliflozin; Reglan [metoclopramide]; Ciprofloxacin; and Sulfur   Social History   Socioeconomic History  . Marital status: Married    Spouse name: None    . Number of children: None  . Years of education: None  . Highest education level: None  Social Needs  . Financial resource strain: None  . Food insecurity - worry: None  . Food insecurity - inability: None  . Transportation needs - medical: None  . Transportation needs - non-medical: None  Occupational History  . None  Tobacco Use  . Smoking status: Never Smoker  . Smokeless tobacco: Never Used  . Tobacco comment: NEVER USED TOBACCO  Substance and Sexual Activity  . Alcohol use: Yes    Alcohol/week: 1.2 oz    Types: 2 Standard drinks or equivalent per week  . Drug use: No  . Sexual activity: Yes    Partners: Male    Birth control/protection: Surgical  Other Topics Concern  . None  Social History Narrative  . None     Family History: The patient's family history includes Breast cancer in her maternal aunt and maternal grandmother; Cancer in her other; Colon cancer in her cousin and father; Diabetes in her mother; Melanoma in her cousin; Ovarian cancer in her cousin and maternal aunt. Her mother had an unprovoked pulmonary embolism ROS:   Review of Systems  Constitution: Negative.  HENT: Negative.   Eyes: Negative.  Negative for visual disturbance.  Cardiovascular: Positive for chest pain.  Respiratory: Negative.   Endocrine: Negative.   Hematologic/Lymphatic: Negative.   Skin: Negative.   Musculoskeletal: Negative.   Gastrointestinal: Negative.   Genitourinary: Negative.   Neurological: Negative.   Psychiatric/Behavioral: Negative.   Allergic/Immunologic: Negative.    Please see the history of present illness.     All other systems reviewed and are negative.  EKGs/Labs/Other Studies Reviewed:    The following studies were reviewed today:   EKG:  EKG is  ordered today.  The ekg ordered today demonstrates [facility] and normal.  Recent Labs: 03/11/2017: ALT(SGPT) 25; BUN, Bld 9; Creat 1.2; HGB 14.9; Platelets 273; Potassium 4.5; Sodium 144  Recent Lipid Panel     Component Value Date/Time   CHOL 185 03/11/2017 0822   TRIG 145 03/11/2017 0822   HDL 48 03/11/2017 0822   CHOLHDL 3.9 03/11/2017 0822   CHOLHDL 5 11/25/2016 0841   VLDL 30.2 11/25/2016 0841   LDLCALC 108 (H) 03/11/2017 0822   LDLDIRECT 165.0 11/06/2014 0927    Physical Exam:    VS:  BP 116/72 (BP Location: Left Arm, Patient Position: Sitting, Cuff Size: Normal)   Pulse (!) 107   Ht 5' 4.5" (1.638 m)   Wt 164 lb (74.4 kg)   SpO2 98%   BMI 27.72 kg/m     Wt Readings from Last 3 Encounters:  08/05/17 164 lb (74.4 kg)  07/27/17 156 lb 12.8 oz (71.1 kg)  05/12/17 159 lb (72.1 kg)     GEN:  Well nourished, well developed in no acute distress HEENT: Normal NECK: No JVD; No carotid bruits LYMPHATICS: No lymphadenopathy CARDIAC: RRR, no murmurs, rubs, gallops RESPIRATORY:  Clear to auscultation without rales, wheezing or rhonchi  ABDOMEN: Soft, non-tender, non-distended MUSCULOSKELETAL:  No edema; No deformity  SKIN: Warm and dry NEUROLOGIC:  Alert and oriented x 3 PSYCHIATRIC:  Normal affect     Signed, Shirlee More, MD  08/05/2017 1:09 PM    [place]

## 2017-08-06 LAB — TROPONIN I

## 2017-08-10 ENCOUNTER — Ambulatory Visit: Payer: BC Managed Care – PPO | Admitting: Cardiology

## 2017-08-25 NOTE — Progress Notes (Signed)
Cardiology Office Note:    Date:  08/26/2017   ID:  Carolyn Wilson, DOB February 24, 1971, MRN [id number]  PCP:  Mackie Pai, PA-C  Cardiologist:  Shirlee More, MD    Referring MD: Mackie Pai, PA-C    ASSESSMENT:    1. Abnormal cardiovascular stress test   2. Tachycardia    PLAN:    In order of problems listed above:  1. I remain concerned as her resting heart rate was 95 blood pressure response was excessive and she had limiting chest discomfort on the treadmill.  Although the EKG images and echo images are normal she requires further evaluation after discussion of risks benefits and options will undergo cardiac CTA with fractional flow reserve.  She has had recent lab work and her thyroid TSH is normal along with a CBC and a resting tachycardia may be influenced by diabetes and autonomic dysfunction.  I am not can restrict her activities and I will plan to see her back in the office if the CTA is abnormal. 2. Resting heart rate is relatively high despite being a endurance athlete normal CBC and TSH.  She may have an element of autonomic dysfunction from diabetes.   Next appointment: prn   Medication Adjustments/Labs and Tests Ordered: Current medicines are reviewed at length with the patient today.  Concerns regarding medicines are outlined above.  Orders Placed This Encounter  Procedures  . CT CORONARY FRACTIONAL FLOW RESERVE ANATOMICAL DATA REVIEW  . CT COROARY FRACTIONAL FLOW RESERVE (FFR)   Meds ordered this encounter  Medications  . metoprolol tartrate (LOPRESSOR) 50 MG tablet    Sig: Take 1 tablet (50 mg total) by mouth once for 1 dose. Take 1 hour prior to cardiac CTA.    Dispense:  1 tablet    Refill:  0    Chief Complaint  Patient presents with  . Follow-up    3 week flup after stress echo this am     History of Present Illness:    Carolyn Wilson is a 47 y.o. female with a hx of T2DM, breast cancer  and Sertoli-Leydig tumor last seen 07/26/17 for  tachycardia with exercise.. Compliance with diet, lifestyle and medications: Yes She had a stress echo done this morning and unfortunately despite a normal EKG and echo images she had exertional chest pain that was limiting resting tachycardia hypertensive blood pressure response for a woman and surprisingly an exercise tolerance which seems to be disproportionately low to her activity level is well trained endurance athlete.  With her cancers she is at high risk and I asked her to undergo further evaluation with cardiac CTA.  If normal I will see her back as needed Past Medical History:  Diagnosis Date  . Abnormal Pap smear of cervix    h/o colposcopy/biopsy  . Allergy   . Asthma    HX  . Breast cancer ([place]) 2007  . Cancer ([place]) [id number]   ovarian  . Diabetes mellitus without complication ([place]) 73/2202    diagnosed by PCP, [name]   . GERD (gastroesophageal reflux disease)   . Hyperlipemia    on medication     Past Surgical History:  Procedure Laterality Date  . APPENDECTOMY     age [age]  . BREAST LUMPECTOMY Left 2008   left breast  . CHOLECYSTECTOMY  2000  . ENDOMETRIAL ABLATION  2009  . LAPAROSCOPIC SALPINGO OOPHERECTOMY Left 1996   with right tubal ligation  . TUBAL LIGATION  Current Medications: Current Meds  Medication Sig  . aspirin 81 MG tablet Take 81 mg by mouth daily.  [name] EPINEPHrine 0.3 mg/0.3 mL IJ SOAJ injection Inject once into muscle for any allergic reaction to peanut exposure  . ezetimibe (ZETIA) 10 MG tablet Take 1 tablet (10 mg total) by mouth daily.  [name] glucose blood (ONETOUCH VERIO) test strip Check blood sugar daily as directed  . Lancets (ONETOUCH ULTRASOFT) lancets Check blood sugar daily as directed.  . metFORMIN (GLUCOPHAGE-XR) 500 MG 24 hr tablet Take 500 mg by mouth 2 (two) times daily.   [name] omeprazole (PRILOSEC) 20 MG capsule Take 1 capsule (20 mg total) by mouth daily.  . ondansetron (ZOFRAN ODT) 4 MG disintegrating tablet Take 1 tablet (4  mg total) by mouth every 8 (eight) hours as needed for nausea or vomiting.  . vitamin C (ASCORBIC ACID) 500 MG tablet Take 500 mg by mouth daily.     Allergies:   Latex; Peanut-containing drug products; Sulfa antibiotics; Adhesive [tape]; Canagliflozin; Reglan [metoclopramide]; Ciprofloxacin; and Sulfur   Social History   Socioeconomic History  . Marital status: Married    Spouse name: None  . Number of children: None  . Years of education: None  . Highest education level: None  Social Needs  . Financial resource strain: None  . Food insecurity - worry: None  . Food insecurity - inability: None  . Transportation needs - medical: None  . Transportation needs - non-medical: None  Occupational History  . None  Tobacco Use  . Smoking status: Never Smoker  . Smokeless tobacco: Never Used  . Tobacco comment: NEVER USED TOBACCO  Substance and Sexual Activity  . Alcohol use: Yes    Alcohol/week: 1.2 oz    Types: 2 Standard drinks or equivalent per week  . Drug use: No  . Sexual activity: Yes    Partners: Male    Birth control/protection: Surgical  Other Topics Concern  . None  Social History Narrative  . None     Family History: The patient's family history includes Breast cancer in her maternal aunt and maternal grandmother; Cancer in her other; Colon cancer in her cousin and father; Diabetes in her mother; Melanoma in her cousin; Ovarian cancer in her cousin and maternal aunt. ROS:   Please see the history of present illness.    All other systems reviewed and are negative.  EKGs/Labs/Other Studies Reviewed:    The following studies were reviewed today:  EKG:  EKG ordered today.  Her previous stress test EKG is reported to me is sinus rhythm normal heart rate 95  Recent Labs: 03/11/2017: ALT(SGPT) 25; BUN, Bld 9; Creat 1.2; HGB 14.9; Platelets 273; Potassium 4.5; Sodium 144  Recent Lipid Panel    Component Value Date/Time   CHOL 185 03/11/2017 0822   TRIG 145  03/11/2017 0822   HDL 48 03/11/2017 0822   CHOLHDL 3.9 03/11/2017 0822   CHOLHDL 5 11/25/2016 0841   VLDL 30.2 11/25/2016 0841   LDLCALC 108 (H) 03/11/2017 0822   LDLDIRECT 165.0 11/06/2014 0927    Physical Exam:    VS:  BP 102/76 (BP Location: Right Arm, Patient Position: Sitting, Cuff Size: Normal)   Pulse (!) 117   Ht 5' 4.5" (1.638 m)   Wt 161 lb (73 kg)   SpO2 98%   BMI 27.21 kg/m     Wt Readings from Last 3 Encounters:  08/26/17 161 lb (73 kg)  08/05/17 164 lb (74.4 kg)  07/27/17  156 lb 12.8 oz (71.1 kg)     GEN:  Well nourished, well developed in no acute distress HEENT: Normal NECK: No JVD; No carotid bruits LYMPHATICS: No lymphadenopathy CARDIAC:  RRR, no murmurs, rubs, gallops RESPIRATORY:  Clear to auscultation without rales, wheezing or rhonchi  ABDOMEN: Soft, non-tender, non-distended MUSCULOSKELETAL:  No edema; No deformity  SKIN: Warm and dry NEUROLOGIC:  Alert and oriented x 3 PSYCHIATRIC:  Normal affect    Signed, Shirlee More, MD  08/26/2017 3:12 PM    [place]

## 2017-08-26 ENCOUNTER — Ambulatory Visit (HOSPITAL_BASED_OUTPATIENT_CLINIC_OR_DEPARTMENT_OTHER)
Admission: RE | Admit: 2017-08-26 | Discharge: 2017-08-26 | Disposition: A | Discharge status: Home / Self Care | Payer: BC Managed Care – PPO | Source: Ambulatory Visit | Attending: Cardiology | Admitting: Cardiology

## 2017-08-26 ENCOUNTER — Encounter: Payer: Self-pay | Admitting: Hematology & Oncology

## 2017-08-26 ENCOUNTER — Inpatient Hospital Stay (HOSPITAL_BASED_OUTPATIENT_CLINIC_OR_DEPARTMENT_OTHER): Payer: BC Managed Care – PPO | Admitting: Hematology & Oncology

## 2017-08-26 ENCOUNTER — Encounter: Payer: Self-pay | Admitting: Cardiology

## 2017-08-26 ENCOUNTER — Other Ambulatory Visit: Payer: Self-pay | Admitting: Cardiology

## 2017-08-26 ENCOUNTER — Other Ambulatory Visit: Payer: Self-pay

## 2017-08-26 ENCOUNTER — Inpatient Hospital Stay: Payer: BC Managed Care – PPO | Attending: Hematology & Oncology

## 2017-08-26 ENCOUNTER — Ambulatory Visit: Payer: BC Managed Care – PPO | Admitting: Cardiology

## 2017-08-26 VITALS — BP 131/75 | HR 107 | Temp 97.3°F | Resp 18 | Wt 161.0 lb

## 2017-08-26 VITALS — BP 102/76 | HR 117 | Ht 64.5 in | Wt 161.0 lb

## 2017-08-26 DIAGNOSIS — R9439 Abnormal result of other cardiovascular function study: Secondary | ICD-10-CM | POA: Diagnosis not present

## 2017-08-26 DIAGNOSIS — E119 Type 2 diabetes mellitus without complications: Secondary | ICD-10-CM | POA: Diagnosis not present

## 2017-08-26 DIAGNOSIS — R000 Tachycardia, unspecified: Secondary | ICD-10-CM | POA: Insufficient documentation

## 2017-08-26 DIAGNOSIS — Z8543 Personal history of malignant neoplasm of ovary: Secondary | ICD-10-CM

## 2017-08-26 DIAGNOSIS — R079 Chest pain, unspecified: Secondary | ICD-10-CM | POA: Insufficient documentation

## 2017-08-26 DIAGNOSIS — Z803 Family history of malignant neoplasm of breast: Secondary | ICD-10-CM

## 2017-08-26 LAB — CBC WITH DIFFERENTIAL (CANCER CENTER ONLY)
Basophils Absolute: 0 10*3/uL (ref 0.0–0.1)
Basophils Relative: 0 %
EOS ABS: 0.1 10*3/uL (ref 0.0–0.5)
EOS PCT: 1 %
HCT: 40.5 % (ref 34.8–46.6)
Hemoglobin: 13.7 g/dL (ref 11.6–15.9)
LYMPHS ABS: 2.5 10*3/uL (ref 0.9–3.3)
LYMPHS PCT: 25 %
MCH: 30.6 pg (ref 26.0–34.0)
MCHC: 33.8 g/dL (ref 32.0–36.0)
MCV: 90.4 fL (ref 81.0–101.0)
MONO ABS: 0.5 10*3/uL (ref 0.1–0.9)
Monocytes Relative: 5 %
Neutro Abs: 6.7 10*3/uL — ABNORMAL HIGH (ref 1.5–6.5)
Neutrophils Relative %: 69 %
PLATELETS: 294 10*3/uL (ref 145–400)
RBC: 4.48 MIL/uL (ref 3.70–5.32)
RDW: 12.6 % (ref 11.1–15.7)
WBC: 10 10*3/uL (ref 3.9–10.0)

## 2017-08-26 LAB — CMP (CANCER CENTER ONLY)
ALBUMIN: 3.7 g/dL (ref 3.5–5.0)
ALK PHOS: 55 U/L (ref 26–84)
ALT: 27 U/L (ref 0–55)
AST: 24 U/L (ref 5–34)
Anion gap: 7 (ref 5–15)
BUN: 11 mg/dL (ref 7–22)
CHLORIDE: 109 mmol/L — AB (ref 98–108)
CO2: 29 mmol/L (ref 18–33)
CREATININE: 0.7 mg/dL (ref 0.60–1.10)
Calcium: 9.4 mg/dL (ref 8.0–10.3)
GLUCOSE: 101 mg/dL (ref 73–118)
POTASSIUM: 4 mmol/L (ref 3.3–4.7)
SODIUM: 145 mmol/L (ref 128–145)
Total Bilirubin: 0.7 mg/dL (ref 0.2–1.2)
Total Protein: 7.3 g/dL (ref 6.4–8.1)

## 2017-08-26 MED ORDER — METOPROLOL TARTRATE 50 MG PO TABS: 50.0000 mg | ORAL_TABLET | Freq: Once | ORAL | 0 refills | Status: DC

## 2017-08-26 NOTE — Progress Notes (Signed)
Hematology and Oncology Follow Up Visit  Carolyn Wilson [id number] 1970-07-29 47 y.o. 08/26/2017   Principle Diagnosis:  History of Sertoli-Leydig tumor of the left ovary  Strong family history of breast and ovarian cancer - BRCA 1/2 and p53 negative  Current Therapy:   Observation    Interim History:  Carolyn Wilson is back for follow-up.  She is doing well.  We last saw her back in August.  Her son graduated from [employer].  He is interviewing for homeland security now.  Hopefully, he will get a nice job with them.  She apparently had an abnormal stress test.  She has some tachycardia.  She is supposed to have a cardiac CT scan.  She does have diabetes.  I would think that the cardiac CT scan will be looking for calcifications.  If she has coronary artery calcifications, I suspect that she probably will have a cardiac cath.  Otherwise, she is done well.  She is exercising.  She is trying to lose a little bit of weight.  She is trying to watch her diet.  She is working.  It is a little bit stressful at her job.  Her mom is having some problems.  Her mom apparently has some anemia.  She was just transfused.   Her overall, her performance status is ECOG 0.  Medications:  Allergies as of 08/26/2017      Reactions   Latex Rash   Causes blisters   Peanut-containing Drug Products Anaphylaxis   Sulfa Antibiotics Rash, Shortness Of Breath   Shortness of breath, rash Shortness of breath, rash   Adhesive [tape]    Canagliflozin Other (See Comments)   Reglan [metoclopramide] Tinitus   Ciprofloxacin Rash   Sulfur Rash      Medication List        Accurate as of 08/26/17  4:11 PM. Always use your most recent med list.          aspirin 81 MG tablet Take 81 mg by mouth daily.   EPINEPHrine 0.3 mg/0.3 mL Soaj injection Commonly known as:  EPI-PEN Inject once into muscle for any allergic reaction to peanut exposure   ezetimibe 10 MG tablet Commonly known as:   ZETIA Take 1 tablet (10 mg total) by mouth daily.   glucose blood test strip Commonly known as:  ONETOUCH VERIO Check blood sugar daily as directed   metFORMIN 500 MG 24 hr tablet Commonly known as:  GLUCOPHAGE-XR Take 500 mg by mouth 2 (two) times daily.   metoprolol tartrate 50 MG tablet Commonly known as:  LOPRESSOR Take 1 tablet (50 mg total) by mouth once for 1 dose. Take 1 hour prior to cardiac CTA.   omeprazole 20 MG capsule Commonly known as:  PRILOSEC Take 1 capsule (20 mg total) by mouth daily.   ondansetron 4 MG disintegrating tablet Commonly known as:  ZOFRAN ODT Take 1 tablet (4 mg total) by mouth every 8 (eight) hours as needed for nausea or vomiting.   onetouch ultrasoft lancets Check blood sugar daily as directed.   vitamin C 500 MG tablet Commonly known as:  ASCORBIC ACID Take 500 mg by mouth daily.       Allergies:  Allergies  Allergen Reactions  . Latex Rash    Causes blisters  . Peanut-Containing Drug Products Anaphylaxis  . Sulfa Antibiotics Rash and Shortness Of Breath    Shortness of breath, rash Shortness of breath, rash  . Adhesive [Tape]   . Canagliflozin Other (  See Comments)  . Reglan [Metoclopramide] Tinitus  . Ciprofloxacin Rash  . Sulfur Rash    Past Medical History, Surgical history, Social history, and Family History were reviewed and updated.  Review of Systems: Review of Systems  Constitutional: Negative.   HENT: Negative.   Eyes: Negative.   Respiratory: Negative.   Cardiovascular: Positive for palpitations.  Gastrointestinal: Negative.   Genitourinary: Negative.   Musculoskeletal: Negative.   Skin: Positive for rash.  Neurological: Negative.   Endo/Heme/Allergies: Negative.   Psychiatric/Behavioral: Negative.      Physical Exam:  weight is 161 lb (73 kg). Her oral temperature is 97.3 F (36.3 C) (abnormal). Her blood pressure is 131/75 and her pulse is 107 (abnormal). Her respiration is 18 and oxygen saturation  is 100%.   Wt Readings from Last 3 Encounters:  08/26/17 161 lb (73 kg)  08/26/17 161 lb (73 kg)  08/05/17 164 lb (74.4 kg)    Physical Exam  Constitutional: She is oriented to person, place, and time.  HENT:  Head: Normocephalic and atraumatic.  Mouth/Throat: Oropharynx is clear and moist.  Eyes: EOM are normal. Pupils are equal, round, and reactive to light.  Neck: Normal range of motion.  Cardiovascular: Normal rate, regular rhythm and normal heart sounds.  Pulmonary/Chest: Effort normal and breath sounds normal.  Abdominal: Soft. Bowel sounds are normal.  Musculoskeletal: Normal range of motion. She exhibits no edema, tenderness or deformity.  Lymphadenopathy:    She has no cervical adenopathy.  Neurological: She is alert and oriented to person, place, and time.  Skin: Skin is warm and dry. No rash noted. No erythema.  Psychiatric: She has a normal mood and affect. Her behavior is normal. Judgment and thought content normal.  Vitals reviewed.  .   Lab Results  Component Value Date   WBC 10.0 08/26/2017   HGB 14.9 03/11/2017   HCT 40.5 08/26/2017   MCV 90.4 08/26/2017   PLT 294 08/26/2017   No results found for: FERRITIN, IRON, TIBC, UIBC, IRONPCTSAT Lab Results  Component Value Date   RBC 4.48 08/26/2017   No results found for: KPAFRELGTCHN, LAMBDASER, KAPLAMBRATIO No results found for: Kandis Cocking, IGMSERUM No results found for: Odetta Pink, SPEI   Chemistry      Component Value Date/Time   NA 145 08/26/2017 1457   NA 144 03/11/2017 0822   K 4.0 08/26/2017 1457   K 4.5 03/11/2017 0822   CL 109 (H) 08/26/2017 1457   CL 104 03/11/2017 0822   CO2 29 08/26/2017 1457   CO2 29 03/11/2017 0822   BUN 11 08/26/2017 1457   BUN 9 03/11/2017 0822   CREATININE 0.70 08/26/2017 1457   CREATININE 1.2 03/11/2017 0822      Component Value Date/Time   CALCIUM 9.4 08/26/2017 1457   CALCIUM 9.2 03/11/2017 0822    ALKPHOS 55 08/26/2017 1457   ALKPHOS 65 03/11/2017 0822   AST 24 08/26/2017 1457   ALT 27 08/26/2017 1457   ALT 25 03/11/2017 0822   BILITOT 0.7 08/26/2017 1457     Impression and Plan: Carolyn Wilson is a 47 yo post-menopausal female with a 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   I do not see any issues from my point of view.  Hopefully, the cardiac CT scan will turn out okay.  We will plan to get her back in 6 more months.  When we see her back, we  will do her breast exam.   Volanda Napoleon, MD 2/6/20194:11 PM

## 2017-08-26 NOTE — Patient Instructions (Addendum)
Medication Instructions:  Your physician recommends that you continue on your current medications as directed. Please refer to the Current Medication list given to you today.  Labwork: If Dr. Marin Olp does not checking BMP/CMP today, come back to our office.  Testing/Procedures: Your physician has requested that you have cardiac CT. Cardiac computed tomography (CT) is a painless test that uses an x-ray machine to take clear, detailed pictures of your heart. For further information please visit [facility]ugeFiesta[facility]tn. Please follow instruction sheet as given.  Please arrive at the [facility] main entrance of [facility] at xx:xx AM (30-45 minutes prior to test start time)  [place] [address] [place], [place] [address] [phone]  Proceed to the [facility] Radiology Department (First Floor).  Please follow these instructions carefully (unless otherwise directed):  Hold all erectile dysfunction medications at least 48 hours prior to test.  On the Night Before the Test: . Drink plenty of water. . Do not consume any caffeinated/decaffeinated beverages or chocolate 12 hours prior to your test. . Do not take any antihistamines 12 hours prior to your test. . If you take Metformin do not take 24 hours prior to test.  On the Day of the Test: . Drink plenty of water. Do not drink any water within one hour of the test. . Do not eat any food 4 hours prior to the test. . You may take your regular medications prior to the test. . IF NOT ON A BETA BLOCKER - Take 50 mg of lopressor (metoprolol) one hour before the test.  After the Test: . Drink plenty of water. . After receiving IV contrast, you may experience a mild flushed feeling. This is normal. . On occasion, you may experience a mild rash up to 24 hours after the test. This is not dangerous. If this occurs, you can take Benadryl 25 mg and increase your fluid intake. . If you experience trouble breathing, this  can be serious. If it is severe call 911 IMMEDIATELY. If it is mild, please call our office. . If you take any of these medications: Glipizide/Metformin, Avandament, Glucavance, please do not take 48 hours after completing test.   Follow-Up: Your physician recommends that you schedule a follow-up appointment as needed if symptoms worsen or fail to improve.  Any Other Special Instructions Will Be Listed Below (If Applicable).     If you need a refill on your cardiac medications before your next appointment, please call your pharmacy.

## 2017-08-26 NOTE — Progress Notes (Signed)
Echocardiogram Echocardiogram Stress Test has been performed.  Carolyn Wilson 08/26/2017, 1:17 PM

## 2017-09-05 ENCOUNTER — Other Ambulatory Visit: Payer: Self-pay | Admitting: Medical

## 2017-09-07 ENCOUNTER — Telehealth: Payer: Self-pay | Admitting: Medical

## 2017-09-07 ENCOUNTER — Encounter: Payer: Self-pay | Admitting: Medical

## 2017-09-07 DIAGNOSIS — E119 Type 2 diabetes mellitus without complications: Secondary | ICD-10-CM

## 2017-09-07 NOTE — Telephone Encounter (Signed)
Future a1c placed.

## 2017-09-09 ENCOUNTER — Other Ambulatory Visit: Payer: BC Managed Care – PPO

## 2017-09-09 ENCOUNTER — Ambulatory Visit: Payer: BC Managed Care – PPO | Admitting: Hematology & Oncology

## 2017-09-10 ENCOUNTER — Other Ambulatory Visit (INDEPENDENT_AMBULATORY_CARE_PROVIDER_SITE_OTHER): Payer: BC Managed Care – PPO

## 2017-09-10 DIAGNOSIS — E119 Type 2 diabetes mellitus without complications: Secondary | ICD-10-CM | POA: Diagnosis not present

## 2017-09-10 LAB — HEMOGLOBIN A1C: Hgb A1c MFr Bld: 6.5 % (ref 4.6–6.5)

## 2017-09-12 ENCOUNTER — Encounter: Payer: Self-pay | Admitting: Medical

## 2017-09-16 ENCOUNTER — Other Ambulatory Visit: Payer: Self-pay | Admitting: Medical

## 2017-09-18 ENCOUNTER — Ambulatory Visit (HOSPITAL_COMMUNITY)
Admission: RE | Admit: 2017-09-18 | Discharge: 2017-09-18 | Disposition: A | Discharge status: Home / Self Care | Payer: BC Managed Care – PPO | Source: Ambulatory Visit | Attending: Cardiology | Admitting: Cardiology

## 2017-09-18 DIAGNOSIS — R079 Chest pain, unspecified: Secondary | ICD-10-CM | POA: Diagnosis not present

## 2017-09-18 DIAGNOSIS — R9439 Abnormal result of other cardiovascular function study: Secondary | ICD-10-CM

## 2017-09-18 MED ORDER — METOPROLOL TARTRATE 5 MG/5ML IV SOLN
5.0000 mg | INTRAVENOUS | Status: DC | PRN
  Administered 2017-09-18 (×2): 5 mg via INTRAVENOUS
  Filled 2017-09-18 (×2): qty 5

## 2017-09-18 MED ORDER — NITROGLYCERIN 0.4 MG SL SUBL
0.8000 mg | SUBLINGUAL_TABLET | Freq: Once | SUBLINGUAL | Status: AC
  Administered 2017-09-18: 0.8 mg via SUBLINGUAL

## 2017-09-18 MED ORDER — SODIUM CHLORIDE 0.9 % IV SOLN
Freq: Once | INTRAVENOUS | Status: AC
  Administered 2017-09-18: 16:00:00 via INTRAVENOUS

## 2017-09-18 MED ORDER — IOPAMIDOL (ISOVUE-370) INJECTION 76%
100.0000 mL | Freq: Once | INTRAVENOUS | Status: AC | PRN
  Administered 2017-09-18: 100 mL via INTRAVENOUS

## 2017-09-18 MED ORDER — NITROGLYCERIN 0.4 MG SL SUBL
SUBLINGUAL_TABLET | SUBLINGUAL | Status: AC
  Administered 2017-09-18: 0.8 mg via SUBLINGUAL
  Filled 2017-09-18: qty 2

## 2017-09-18 MED ORDER — DILTIAZEM HCL 25 MG/5ML IV SOLN
5.0000 mg | Freq: Once | INTRAVENOUS | Status: AC
  Administered 2017-09-18: 5 mg via INTRAVENOUS
  Filled 2017-09-18: qty 5

## 2017-09-18 MED ORDER — METOPROLOL TARTRATE 5 MG/5ML IV SOLN
INTRAVENOUS | Status: AC
  Administered 2017-09-18: 5 mg via INTRAVENOUS
  Filled 2017-09-18: qty 10

## 2017-09-18 MED ORDER — METOPROLOL TARTRATE 5 MG/5ML IV SOLN
INTRAVENOUS | Status: AC
  Filled 2017-09-18: qty 5

## 2017-09-18 NOTE — Progress Notes (Signed)
Spoke with Dr. Meda Coffee, gave order for 527mL bolus of NS and to proceed with CT scan of heart.

## 2017-09-18 NOTE — Progress Notes (Signed)
CT scan completed, patient received NS bolus, patient denies dizziness and states that she feels fine but tired. Discharged to main entrance.

## 2017-09-18 NOTE — Progress Notes (Signed)
Called Dr. Meda Coffee after second dose of metoprolol, HR is 78. Dr. Meda Coffee ordered Cardizem 5mg .

## 2017-09-21 ENCOUNTER — Encounter (INDEPENDENT_AMBULATORY_CARE_PROVIDER_SITE_OTHER): Payer: Self-pay

## 2017-09-21 ENCOUNTER — Encounter: Payer: Self-pay | Admitting: Obstetrics & Gynecology

## 2017-10-19 ENCOUNTER — Encounter: Payer: Self-pay | Admitting: Obstetrics & Gynecology

## 2017-10-19 ENCOUNTER — Telehealth: Payer: Self-pay | Admitting: Obstetrics & Gynecology

## 2017-10-19 NOTE — Telephone Encounter (Signed)
Call to patient. Left message to call back to triage nurse.  Last seen Jan 2019 for annual with Dr [name] Heck.

## 2017-10-19 NOTE — Telephone Encounter (Signed)
Spoke with patient. Patient states that she feels like she is on "fire" for 30-60 seconds, but skin feels clammy to the touch. This is occurring 5-6 times an hour. Occurs all day and night. Denies any other symptoms. Patient is asking for recommendations for medication for symptom relief. Advised will review with Dr.Miller and return call.

## 2017-10-19 NOTE — Telephone Encounter (Signed)
Message   ----- Message from [place], Generic sent at 10/19/2017 7:33 AM EDT -----    I did the hemorrhoid banding and it was great. I have a quick question since I have had the banding I feel like my body has gone into hot flash overdrive. I am getting about 20 to 30 times a day they last for about a minute then gone. the only thing I am doing different is taking fiber now.

## 2017-10-19 NOTE — Telephone Encounter (Signed)
Spoke with patient. Appointment scheduled with Dr.Miller on 10/23/2017 at 2:30 pm with Dr.Miller. Patient is agreeable to date and time.   Routing to provider for final review. Patient agreeable to disposition. Will close encounter.

## 2017-10-23 ENCOUNTER — Other Ambulatory Visit: Payer: Self-pay

## 2017-10-23 ENCOUNTER — Ambulatory Visit (INDEPENDENT_AMBULATORY_CARE_PROVIDER_SITE_OTHER): Payer: BC Managed Care – PPO | Admitting: Obstetrics & Gynecology

## 2017-10-23 ENCOUNTER — Encounter: Payer: Self-pay | Admitting: Obstetrics & Gynecology

## 2017-10-23 VITALS — BP 106/80 | HR 102 | Resp 16 | Ht 64.5 in | Wt 160.0 lb

## 2017-10-23 DIAGNOSIS — R232 Flushing: Secondary | ICD-10-CM | POA: Diagnosis not present

## 2017-10-23 MED ORDER — GABAPENTIN 100 MG PO CAPS: ORAL_CAPSULE | ORAL | 0 refills | Status: DC

## 2017-10-23 NOTE — Progress Notes (Signed)
GYNECOLOGY  VISIT  CC:   Hot flashes  HPI: 47 y.o. G52P0101 Married Caucasian female here for hot flashes that started about two to three weeks ago.  It is affecting her sleep.  She wakes up several times a night and throws the covers off.  Mood seems stable to her.  Has not noted any vaginal dryness.    Had [place] in 6/17 of 67 and then again in 5/18 17.  Will test today.    GYNECOLOGIC HISTORY: No LMP recorded. Patient has had an ablation. Contraception: ablation Menopausal hormone therapy: none  Patient Active Problem List   Diagnosis Date Noted  . Tachycardia 08/05/2017  . Right shoulder pain 12/18/2016  . Wellness examination 11/21/2015  . Diabetes ([place]) 09/04/2015  . Allergy with anaphylaxis due to peanuts 08/13/2014  . Abnormal Pap smear of cervix  remote Leep  1996 per pt 08/13/2014  . Allergic rhinitis 08/13/2014  . S/P endometrial ablation 08/13/2014  . GERD (gastroesophageal reflux disease) 08/13/2014  . Diarrhea post cholecystectomy since 1999 08/13/2014  . FHx: colon cancer  father  08/13/2014  . 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 Headache, migraine 08/02/2012  . Family history of breast cancer 01/06/2012  . Family history of malignant neoplasm of ovary 01/06/2012    Past Medical History:  Diagnosis Date  . Abnormal Pap smear of cervix    h/o colposcopy/biopsy  . Allergy   . Asthma    HX  . Breast cancer ([place]) 2007  . Cancer ([place]) [id number]   ovarian  . Diabetes mellitus without complication ([place]) 86/5784    diagnosed by PCP, [name]   . GERD (gastroesophageal reflux disease)   . Hyperlipemia    on medication     Past Surgical History:  Procedure Laterality Date  . APPENDECTOMY     age [age]  . BREAST LUMPECTOMY Left 2008   left breast  . CHOLECYSTECTOMY  2000  . ENDOMETRIAL ABLATION  2009  . LAPAROSCOPIC SALPINGO OOPHERECTOMY Left 1996   with right tubal ligation  . TUBAL LIGATION      MEDS:   Current Outpatient  Medications on File Prior to Visit  Medication Sig Dispense Refill  . aspirin 81 MG tablet Take 81 mg by mouth daily.    [name] EPINEPHrine 0.3 mg/0.3 mL IJ SOAJ injection Inject once into muscle for any allergic reaction to peanut exposure 1 Device 1  . ezetimibe (ZETIA) 10 MG tablet TAKE ONE TABLET BY MOUTH ONE TIME DAILY 30 tablet 5  . Lancets (ONETOUCH ULTRASOFT) lancets Check blood sugar daily as directed. 100 each 12  . metFORMIN (GLUCOPHAGE-XR) 500 MG 24 hr tablet Take 500 mg by mouth 2 (two) times daily.     [name] omeprazole (PRILOSEC) 20 MG capsule Take 1 capsule (20 mg total) by mouth daily. 30 capsule 5  . ondansetron (ZOFRAN ODT) 4 MG disintegrating tablet Take 1 tablet (4 mg total) by mouth every 8 (eight) hours as needed for nausea or vomiting. 18 tablet 1  . ONETOUCH VERIO test strip TEST ONCE DAILY AS DIRECTED 100 each 11   No current facility-administered medications on file prior to visit.     ALLERGIES: Latex; Peanut-containing drug products; Sulfa antibiotics; Adhesive [tape]; Canagliflozin; Reglan [metoclopramide]; Ciprofloxacin; and Sulfur  Family History  Problem Relation Age of Onset  . Diabetes Mother   . Colon cancer Father   . Breast cancer Maternal Aunt   . Ovarian cancer Maternal Aunt   . Ovarian cancer Cousin   .  Colon cancer Cousin   . Melanoma Cousin   . Breast cancer Maternal Grandmother   . Cancer Other        breast and ovarian-maternal side    SH:  Married, non smoker  Review of Systems  Musculoskeletal: Positive for myalgias.  Skin: Positive for rash.  Neurological: Positive for headaches.  All other systems reviewed and are negative.   PHYSICAL EXAMINATION:    BP 106/80 (BP Location: Right Arm, Patient Position: Sitting, Cuff Size: Normal)   Pulse (!) 102   Resp 16   Ht 5' 4.5" (1.638 m)   Wt 160 lb (72.6 kg)   BMI 27.04 kg/m     General appearance: alert, cooperative and appears stated age No exam performed  Assessment: Hot  flashes  Plan: Options for treatment discussed with pt including OTC products, Gabapentin, SSRI/SNRI.  Will start with gabapentin 100mg  weekly and increase weekly by 100mg  until at 300mg .   [place] obtained today.   ~20 minutes spent with patient >50% of time was in face to face discussion of above.

## 2017-10-24 LAB — FOLLICLE STIMULATING HORMONE: FSH: 73.8 m[IU]/mL

## 2017-11-02 ENCOUNTER — Emergency Department (HOSPITAL_BASED_OUTPATIENT_CLINIC_OR_DEPARTMENT_OTHER): Payer: BC Managed Care – PPO

## 2017-11-02 ENCOUNTER — Emergency Department (HOSPITAL_BASED_OUTPATIENT_CLINIC_OR_DEPARTMENT_OTHER)
Admission: EM | Admit: 2017-11-02 | Discharge: 2017-11-03 | Disposition: A | Discharge status: Home / Self Care | Payer: BC Managed Care – PPO | Attending: Emergency Medicine | Admitting: Emergency Medicine

## 2017-11-02 ENCOUNTER — Other Ambulatory Visit: Payer: Self-pay

## 2017-11-02 ENCOUNTER — Encounter (HOSPITAL_BASED_OUTPATIENT_CLINIC_OR_DEPARTMENT_OTHER): Payer: Self-pay | Admitting: Emergency Medicine

## 2017-11-02 DIAGNOSIS — R110 Nausea: Secondary | ICD-10-CM | POA: Insufficient documentation

## 2017-11-02 DIAGNOSIS — Z91040 Latex allergy status: Secondary | ICD-10-CM | POA: Insufficient documentation

## 2017-11-02 DIAGNOSIS — Z8543 Personal history of malignant neoplasm of ovary: Secondary | ICD-10-CM | POA: Insufficient documentation

## 2017-11-02 DIAGNOSIS — Z79899 Other long term (current) drug therapy: Secondary | ICD-10-CM | POA: Insufficient documentation

## 2017-11-02 DIAGNOSIS — R05 Cough: Secondary | ICD-10-CM | POA: Insufficient documentation

## 2017-11-02 DIAGNOSIS — Z853 Personal history of malignant neoplasm of breast: Secondary | ICD-10-CM | POA: Diagnosis not present

## 2017-11-02 DIAGNOSIS — R112 Nausea with vomiting, unspecified: Secondary | ICD-10-CM | POA: Diagnosis not present

## 2017-11-02 DIAGNOSIS — R0989 Other specified symptoms and signs involving the circulatory and respiratory systems: Secondary | ICD-10-CM | POA: Diagnosis not present

## 2017-11-02 DIAGNOSIS — Z7984 Long term (current) use of oral hypoglycemic drugs: Secondary | ICD-10-CM | POA: Insufficient documentation

## 2017-11-02 DIAGNOSIS — R509 Fever, unspecified: Secondary | ICD-10-CM | POA: Diagnosis present

## 2017-11-02 DIAGNOSIS — Z7982 Long term (current) use of aspirin: Secondary | ICD-10-CM | POA: Insufficient documentation

## 2017-11-02 DIAGNOSIS — J45909 Unspecified asthma, uncomplicated: Secondary | ICD-10-CM | POA: Insufficient documentation

## 2017-11-02 DIAGNOSIS — R000 Tachycardia, unspecified: Secondary | ICD-10-CM | POA: Diagnosis not present

## 2017-11-02 DIAGNOSIS — E119 Type 2 diabetes mellitus without complications: Secondary | ICD-10-CM | POA: Insufficient documentation

## 2017-11-02 DIAGNOSIS — Z91010 Allergy to peanuts: Secondary | ICD-10-CM | POA: Diagnosis not present

## 2017-11-02 LAB — URINALYSIS, ROUTINE W REFLEX MICROSCOPIC
BILIRUBIN URINE: NEGATIVE
GLUCOSE, UA: NEGATIVE mg/dL
HGB URINE DIPSTICK: NEGATIVE
Ketones, ur: NEGATIVE mg/dL
Leukocytes, UA: NEGATIVE
Nitrite: NEGATIVE
PROTEIN: NEGATIVE mg/dL
Specific Gravity, Urine: 1.01 (ref 1.005–1.030)
pH: 8.5 — ABNORMAL HIGH (ref 5.0–8.0)

## 2017-11-02 LAB — COMPREHENSIVE METABOLIC PANEL
ALK PHOS: 70 U/L (ref 38–126)
ALT: 50 U/L (ref 14–54)
AST: 49 U/L — AB (ref 15–41)
Albumin: 4.5 g/dL (ref 3.5–5.0)
Anion gap: 11 (ref 5–15)
BUN: 11 mg/dL (ref 6–20)
CALCIUM: 9.5 mg/dL (ref 8.9–10.3)
CHLORIDE: 103 mmol/L (ref 101–111)
CO2: 24 mmol/L (ref 22–32)
CREATININE: 0.84 mg/dL (ref 0.44–1.00)
GFR calc Af Amer: >60 mL/min (ref >60)
GFR calc non Af Amer: >60 mL/min (ref >60)
Glucose, Bld: 141 mg/dL — ABNORMAL HIGH (ref 65–99)
Potassium: 4.2 mmol/L (ref 3.5–5.1)
SODIUM: 138 mmol/L (ref 135–145)
Total Bilirubin: 0.5 mg/dL (ref 0.3–1.2)
Total Protein: 7.8 g/dL (ref 6.5–8.1)

## 2017-11-02 LAB — CBC
HCT: 41.4 % (ref 36.0–46.0)
Hemoglobin: 14.3 g/dL (ref 12.0–15.0)
MCH: 30.3 pg (ref 26.0–34.0)
MCHC: 34.5 g/dL (ref 30.0–36.0)
MCV: 87.7 fL (ref 78.0–100.0)
PLATELETS: 300 10*3/uL (ref 150–400)
RBC: 4.72 MIL/uL (ref 3.87–5.11)
RDW: 13.1 % (ref 11.5–15.5)
WBC: 11.9 10*3/uL — ABNORMAL HIGH (ref 4.0–10.5)

## 2017-11-02 LAB — I-STAT CG4 LACTIC ACID, ED
LACTIC ACID, VENOUS: 1.15 mmol/L (ref 0.5–1.9)
Lactic Acid, Venous: 1.68 mmol/L (ref 0.5–1.9)

## 2017-11-02 LAB — LIPASE, BLOOD: LIPASE: 81 U/L — AB (ref 11–51)

## 2017-11-02 LAB — CBG MONITORING, ED: Glucose-Capillary: 123 mg/dL — ABNORMAL HIGH (ref 65–99)

## 2017-11-02 LAB — PREGNANCY, URINE: Preg Test, Ur: NEGATIVE

## 2017-11-02 MED ORDER — IOPAMIDOL (ISOVUE-300) INJECTION 61%
100.0000 mL | Freq: Once | INTRAVENOUS | Status: AC | PRN
  Administered 2017-11-02: 100 mL via INTRAVENOUS

## 2017-11-02 MED ORDER — PROMETHAZINE HCL 25 MG/ML IJ SOLN
25.0000 mg | Freq: Once | INTRAMUSCULAR | Status: AC
  Administered 2017-11-02: 25 mg via INTRAVENOUS
  Filled 2017-11-02: qty 1

## 2017-11-02 MED ORDER — SODIUM CHLORIDE 0.9 % IV BOLUS
1000.0000 mL | Freq: Once | INTRAVENOUS | Status: AC
  Administered 2017-11-02: 1000 mL via INTRAVENOUS

## 2017-11-02 MED ORDER — LORAZEPAM 2 MG/ML IJ SOLN
1.0000 mg | Freq: Once | INTRAMUSCULAR | Status: AC
  Administered 2017-11-02: 1 mg via INTRAVENOUS
  Filled 2017-11-02: qty 1

## 2017-11-02 MED ORDER — ONDANSETRON HCL 4 MG/2ML IJ SOLN
4.0000 mg | Freq: Once | INTRAMUSCULAR | Status: AC | PRN
  Administered 2017-11-02: 4 mg via INTRAVENOUS
  Filled 2017-11-02: qty 2

## 2017-11-02 MED ORDER — ONDANSETRON HCL 4 MG/2ML IJ SOLN
4.0000 mg | Freq: Once | INTRAMUSCULAR | Status: AC
  Administered 2017-11-02: 4 mg via INTRAVENOUS
  Filled 2017-11-02: qty 2

## 2017-11-02 NOTE — ED Triage Notes (Signed)
Fever and vomiting today. Took tylenol at 2pm and zofran without relief. Pt vomiting in triage.

## 2017-11-02 NOTE — ED Notes (Signed)
Patient transported to X-ray 

## 2017-11-02 NOTE — ED Notes (Signed)
PP challenge started.

## 2017-11-02 NOTE — ED Notes (Signed)
Dr. Rees at the bedside.  

## 2017-11-02 NOTE — ED Provider Notes (Signed)
[facility] EMERGENCY DEPARTMENT Provider Note   CSN: [id number] Arrival date & time: 11/02/17  1938     History   Chief Complaint Chief Complaint  Patient presents with  . Fever  . Emesis    HPI Carolyn Wilson is a 47 y.o. female.  The history is provided by the patient. No language interpreter was used.  Fever   Associated symptoms include vomiting.  Emesis   Associated symptoms include a fever.   Carolyn Wilson is a 47 y.o. female who presents to the Emergency Department complaining of fever, vomiting. This morning she awoke with nausea and queasy type sensation. Around 11 o'clock this morning she developed multiple episodes of emesis accompanied by a fever to 100.7. She reports mild runny nose and cough that she attributes to seasonal allergies. She has mild abdominal cramping that she attributes to her vomiting. She took Zofran at home with no improvement in her symptoms. No known sick contacts or bad food exposures. No prior similar symptoms. She does have a history of hysterectomy, appendectomy and cholecystectomy. Symptoms are moderate to severe, constant, worsening. Past Medical History:  Diagnosis Date  . Abnormal Pap smear of cervix    h/o colposcopy/biopsy  . Allergy   . Asthma    HX  . Breast cancer ([place]) 2007  . Cancer ([place]) [id number]   ovarian  . Diabetes mellitus without complication ([place]) 57/8469    diagnosed by PCP, [name]   . GERD (gastroesophageal reflux disease)   . Hyperlipemia    on medication     Patient Active Problem List   Diagnosis Date Noted  . Tachycardia 08/05/2017  . Right shoulder pain 12/18/2016  . Wellness examination 11/21/2015  . Diabetes ([place]) 09/04/2015  . Allergy with anaphylaxis due to peanuts 08/13/2014  . Abnormal Pap smear of cervix  remote Leep  1996 per pt 08/13/2014  . Allergic rhinitis 08/13/2014  . S/P endometrial ablation 08/13/2014  . GERD (gastroesophageal reflux disease) 08/13/2014  .  Diarrhea post cholecystectomy since 1999 08/13/2014  . FHx: colon cancer  father  08/13/2014  . 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 Headache, migraine 08/02/2012  . Family history of breast cancer 01/06/2012  . Family history of malignant neoplasm of ovary 01/06/2012    Past Surgical History:  Procedure Laterality Date  . APPENDECTOMY     age [age]  . BREAST LUMPECTOMY Left 2008   left breast  . CHOLECYSTECTOMY  2000  . ENDOMETRIAL ABLATION  2009  . LAPAROSCOPIC SALPINGO OOPHERECTOMY Left 1996   with right tubal ligation  . TUBAL LIGATION       OB History    Gravida  1   Para  1   Term      Preterm  1   AB      Living  1     SAB      TAB      Ectopic      Multiple      Live Births               Home Medications    Prior to Admission medications   Medication Sig Start Date End Date Taking? Authorizing Provider  aspirin 81 MG tablet Take 81 mg by mouth daily.    [provider]  EPINEPHrine 0.3 mg/0.3 mL IJ SOAJ injection Inject once into muscle for any allergic reaction to peanut exposure 08/10/14   Schoenhoff, Altamese Cabal, MD  ezetimibe (ZETIA) 10  MG tablet TAKE ONE TABLET BY MOUTH ONE TIME DAILY 09/16/17   Saguier, Percell Miller, PA-C  gabapentin (NEURONTIN) 100 MG capsule Take 1 capsule nightly x 7 nights.  Then increase to 2 capsules nightly x 7 days.  Then increase to 3 capsules nightly. 10/23/17   Megan Salon, MD  Lancets [facility] ULTRASOFT) lancets Check blood sugar daily as directed. 08/27/16   Saguier, Percell Miller, PA-C  metFORMIN (GLUCOPHAGE-XR) 500 MG 24 hr tablet Take 500 mg by mouth 2 (two) times daily.  12/28/16   [provider]  omeprazole (PRILOSEC) 20 MG capsule Take 1 capsule (20 mg total) by mouth daily. 03/17/17   Saguier, Percell Miller, PA-C  ondansetron (ZOFRAN ODT) 4 MG disintegrating tablet Take 1 tablet (4 mg total) by mouth every 8 (eight) hours as needed for nausea or vomiting. 05/11/17   Saguier, Percell Miller, PA-C    ONETOUCH VERIO test strip TEST ONCE DAILY AS DIRECTED 09/07/17   Saguier, Percell Miller, PA-C  promethazine (PHENERGAN) 25 MG tablet Take 1 tablet (25 mg total) by mouth every 8 (eight) hours as needed for nausea or vomiting. 11/03/17   Quintella Reichert, MD    Family History Family History  Problem Relation Age of Onset  . Diabetes Mother   . Colon cancer Father   . Breast cancer Maternal Aunt   . Ovarian cancer Maternal Aunt   . Ovarian cancer Cousin   . Colon cancer Cousin   . Melanoma Cousin   . Breast cancer Maternal Grandmother   . Cancer Other        breast and ovarian-maternal side    Social History Social History   Tobacco Use  . Smoking status: Never Smoker  . Smokeless tobacco: Never Used  . Tobacco comment: NEVER USED TOBACCO  Substance Use Topics  . Alcohol use: Yes    Alcohol/week: 1.2 oz    Types: 2 Standard drinks or equivalent per week  . Drug use: No     Allergies   Latex; Peanut-containing drug products; Sulfa antibiotics; Adhesive [tape]; Canagliflozin; Reglan [metoclopramide]; Ciprofloxacin; and Sulfur   Review of Systems Review of Systems  Constitutional: Positive for fever.  Gastrointestinal: Positive for vomiting.  All other systems reviewed and are negative.    Physical Exam Updated Vital Signs BP 105/71   Pulse (!) 123   Temp 100.1 F (37.8 C) (Oral)   Resp 20   Ht 5\' 5"  (1.651 m)   Wt 68.5 kg (151 lb)   SpO2 95%   BMI 25.13 kg/m   Physical Exam  Constitutional: She is oriented to person, place, and time. She appears well-developed and well-nourished.  HENT:  Head: Normocephalic and atraumatic.  Cardiovascular: Regular rhythm.  No murmur heard. Tachycardic  Pulmonary/Chest: Effort normal and breath sounds normal. No respiratory distress.  Abdominal: Soft. There is no rebound and no guarding.  Mild abdominal tenderness  Musculoskeletal: She exhibits no edema or tenderness.  Neurological: She is alert and oriented to person, place,  and time.  Skin: Skin is warm and dry.  Psychiatric: She has a normal mood and affect. Her behavior is normal.  Nursing note and vitals reviewed.    ED Treatments / Results  Labs (all labs ordered are listed, but only abnormal results are displayed) Labs Reviewed  LIPASE, BLOOD - Abnormal; Notable for the following components:      Result Value   Lipase 81 (*)    All other components within normal limits  COMPREHENSIVE METABOLIC PANEL - Abnormal; Notable for the following  components:   Glucose, Bld 141 (*)    AST 49 (*)    All other components within normal limits  CBC - Abnormal; Notable for the following components:   WBC 11.9 (*)    All other components within normal limits  URINALYSIS, ROUTINE W REFLEX MICROSCOPIC - Abnormal; Notable for the following components:   pH 8.5 (*)    All other components within normal limits  CBG MONITORING, ED - Abnormal; Notable for the following components:   Glucose-Capillary 123 (*)    All other components within normal limits  PREGNANCY, URINE  I-STAT CG4 LACTIC ACID, ED  I-STAT CG4 LACTIC ACID, ED    EKG EKG Interpretation  Date/Time:  Monday November 02 2017 20:07:42 EDT Ventricular Rate:  138 PR Interval:    QRS Duration: 79 QT Interval:  288 QTC Calculation: 437 R Axis:   153 Text Interpretation:  Sinus tachycardia Right axis deviation Low voltage, precordial leads Confirmed by Quintella Reichert [phone]) on 11/02/2017 8:13:39 PM   Radiology Dg Chest 2 View  Result Date: 11/02/2017 CLINICAL DATA:  Acute onset of fever and vomiting.  Cough. EXAM: CHEST - 2 VIEW COMPARISON:  Chest radiograph performed 05/26/2014, and CTA of the chest performed 09/18/2017 FINDINGS: The lungs are well-aerated and clear. There is no evidence of focal opacification, pleural effusion or pneumothorax. The heart is normal in size; the mediastinal contour is within normal limits. No acute osseous abnormalities are seen. Clips are noted within the right upper  quadrant, reflecting prior cholecystectomy. IMPRESSION: No acute cardiopulmonary process seen. Electronically Signed   By: Garald Balding M.D.   On: 11/02/2017 21:26   Ct Abdomen Pelvis W Contrast  Result Date: 11/02/2017 CLINICAL DATA:  Acute onset of fever, nausea and vomiting. Elevated lipase. EXAM: CT ABDOMEN AND PELVIS WITH CONTRAST TECHNIQUE: Multidetector CT imaging of the abdomen and pelvis was performed using the standard protocol following bolus administration of intravenous contrast. CONTRAST:  134mL ISOVUE-300 IOPAMIDOL (ISOVUE-300) INJECTION 61% COMPARISON:  Pelvic ultrasound performed 05/12/2017 FINDINGS: Lower chest: The visualized lung bases are grossly clear. The visualized portions of the mediastinum are unremarkable. Hepatobiliary: The liver is unremarkable in appearance. The patient is status post cholecystectomy, with clips noted at the gallbladder fossa. The common bile duct remains normal in caliber. Pancreas: The pancreas is within normal limits. Spleen: The spleen is unremarkable in appearance. Adrenals/Urinary Tract: The adrenal glands are unremarkable in appearance. The kidneys are within normal limits. There is no evidence of hydronephrosis. No renal or ureteral stones are identified. No perinephric stranding is seen. Stomach/Bowel: The stomach is unremarkable in appearance. The small bowel is within normal limits. The patient is status post appendectomy. The colon is unremarkable in appearance. Vascular/Lymphatic: The abdominal aorta is unremarkable in appearance. The inferior vena cava is grossly unremarkable. No retroperitoneal lymphadenopathy is seen. No pelvic sidewall lymphadenopathy is identified. Reproductive: The bladder is mildly distended and grossly unremarkable. The uterus is unremarkable in appearance. The ovaries are grossly symmetric. No suspicious adnexal masses are seen. Other: No additional soft tissue abnormalities are seen. Musculoskeletal: No acute osseous  abnormalities are identified. The visualized musculature is unremarkable in appearance. IMPRESSION: Unremarkable contrast-enhanced CT of the abdomen and pelvis. Electronically Signed   By: Garald Balding M.D.   On: 11/02/2017 23:07    Procedures Procedures (including critical care time)  Medications Ordered in ED Medications  ondansetron (ZOFRAN) injection 4 mg (4 mg Intravenous Given 11/02/17 1955)  sodium chloride 0.9 % bolus 1,000 mL (0 mLs  Intravenous Stopped 11/02/17 2144)  promethazine (PHENERGAN) injection 25 mg (25 mg Intravenous Given 11/02/17 2048)  ondansetron (ZOFRAN) injection 4 mg (4 mg Intravenous Given 11/02/17 2237)  LORazepam (ATIVAN) injection 1 mg (1 mg Intravenous Given 11/02/17 2237)  iopamidol (ISOVUE-300) 61 % injection 100 mL (100 mLs Intravenous Contrast Given 11/02/17 2246)     Initial Impression / Assessment and Plan / ED Course  I have reviewed the triage vital signs and the nursing notes.  Pertinent labs & imaging results that were available during my care of the patient were reviewed by me and considered in my medical decision making (see chart for details).     Patient here for evaluation of fever, vomiting, abdominal cramping. She is tachycardic on evaluation with minimal mild diffuse abdominal tenderness. She did have multiple episodes of emesis in the department treated with multiple doses of antiemetics. She was also treated with IV fluids. On repeat assessment she has minimal abdominal tenderness. CT abdomen was obtained to evaluate for possible bowel obstruction given her recurrent vomiting. CT is negative for bowel obstruction or acute inflammatory process. UA is not consistent with UTI. Presentation is not consistent with PE or pneumonia. She did have persistent tachycardia while in the emergency department. On record review she has had multiple outpatient visits with documented tachycardia from rates in the high 90s to 117. She also has a diagnosis of sinus  tachycardia by cardiology. She does not take anything for her tachycardia. Current presentation is not consistent with sepsis. She is able to tolerate oral fluids in the department and ambulate without difficulty. Plan to discharge home with close outpatient follow-up as well as return precautions.  Final Clinical Impressions(s) / ED Diagnoses   Final diagnoses:  Non-intractable vomiting with nausea, unspecified vomiting type  Sinus tachycardia    ED Discharge Orders        Ordered    promethazine (PHENERGAN) 25 MG tablet  Every 8 hours PRN     11/03/17 0045       Quintella Reichert, MD 11/03/17 [phone]

## 2017-11-03 MED ORDER — PROMETHAZINE HCL 25 MG PO TABS: 25.0000 mg | ORAL_TABLET | Freq: Three times a day (TID) | ORAL | 0 refills | Status: DC | PRN

## 2017-11-03 NOTE — ED Notes (Signed)
Pt able to ambulate with assistance with steady gait, NAD noticed.

## 2017-11-03 NOTE — ED Notes (Signed)
Pt able to tolerate fluids, HR still on the 120's pt very drowsy after all the medications given. Dr. Ralene Bathe notified.

## 2017-11-05 ENCOUNTER — Inpatient Hospital Stay: Payer: BC Managed Care – PPO | Admitting: Medical

## 2017-11-09 ENCOUNTER — Encounter: Payer: Self-pay | Admitting: Obstetrics & Gynecology

## 2017-11-09 ENCOUNTER — Ambulatory Visit: Payer: BC Managed Care – PPO | Admitting: Medical

## 2017-11-09 ENCOUNTER — Encounter: Payer: Self-pay | Admitting: Medical

## 2017-11-09 ENCOUNTER — Telehealth: Payer: Self-pay | Admitting: Obstetrics & Gynecology

## 2017-11-09 ENCOUNTER — Telehealth: Payer: Self-pay | Admitting: Medical

## 2017-11-09 VITALS — BP 127/87 | HR 90 | Temp 97.6°F | Resp 16 | Ht 64.0 in | Wt 159.0 lb

## 2017-11-09 DIAGNOSIS — R5383 Other fatigue: Secondary | ICD-10-CM

## 2017-11-09 DIAGNOSIS — D72829 Elevated white blood cell count, unspecified: Secondary | ICD-10-CM

## 2017-11-09 DIAGNOSIS — R7989 Other specified abnormal findings of blood chemistry: Secondary | ICD-10-CM

## 2017-11-09 DIAGNOSIS — R000 Tachycardia, unspecified: Secondary | ICD-10-CM | POA: Diagnosis not present

## 2017-11-09 DIAGNOSIS — R748 Abnormal levels of other serum enzymes: Secondary | ICD-10-CM

## 2017-11-09 DIAGNOSIS — R110 Nausea: Secondary | ICD-10-CM

## 2017-11-09 DIAGNOSIS — R232 Flushing: Secondary | ICD-10-CM | POA: Diagnosis not present

## 2017-11-09 LAB — CBC WITH DIFFERENTIAL/PLATELET
Basophils Absolute: 0 10*3/uL (ref 0.0–0.1)
Basophils Relative: 0.5 % (ref 0.0–3.0)
EOS PCT: 2.2 % (ref 0.0–5.0)
Eosinophils Absolute: 0.2 10*3/uL (ref 0.0–0.7)
HCT: 39.7 % (ref 36.0–46.0)
HEMOGLOBIN: 13.5 g/dL (ref 12.0–15.0)
Lymphocytes Relative: 30.7 % (ref 12.0–46.0)
Lymphs Abs: 3 10*3/uL (ref 0.7–4.0)
MCHC: 33.9 g/dL (ref 30.0–36.0)
MCV: 87.5 fl (ref 78.0–100.0)
MONO ABS: 0.4 10*3/uL (ref 0.1–1.0)
MONOS PCT: 4 % (ref 3.0–12.0)
Neutro Abs: 6.1 10*3/uL (ref 1.4–7.7)
Neutrophils Relative %: 62.6 % (ref 43.0–77.0)
Platelets: 342 10*3/uL (ref 150.0–400.0)
RBC: 4.54 Mil/uL (ref 3.87–5.11)
RDW: 13.4 % (ref 11.5–15.5)
WBC: 9.8 10*3/uL (ref 4.0–10.5)

## 2017-11-09 LAB — VITAMIN D 25 HYDROXY (VIT D DEFICIENCY, FRACTURES): VITD: 36.93 ng/mL (ref 30.00–100.00)

## 2017-11-09 LAB — COMPREHENSIVE METABOLIC PANEL
ALBUMIN: 4.3 g/dL (ref 3.5–5.2)
ALK PHOS: 89 U/L (ref 39–117)
ALT: 84 U/L — ABNORMAL HIGH (ref 0–35)
AST: 36 U/L (ref 0–37)
BILIRUBIN TOTAL: 0.3 mg/dL (ref 0.2–1.2)
BUN: 10 mg/dL (ref 6–23)
CO2: 29 mEq/L (ref 19–32)
Calcium: 9.8 mg/dL (ref 8.4–10.5)
Chloride: 101 mEq/L (ref 96–112)
Creatinine, Ser: 0.74 mg/dL (ref 0.40–1.20)
GFR: 89.64 mL/min (ref >60.00)
Glucose, Bld: 108 mg/dL — ABNORMAL HIGH (ref 70–99)
POTASSIUM: 4 meq/L (ref 3.5–5.1)
SODIUM: 141 meq/L (ref 135–145)
TOTAL PROTEIN: 7.1 g/dL (ref 6.0–8.3)

## 2017-11-09 LAB — VITAMIN B12

## 2017-11-09 LAB — TSH: TSH: 4.95 u[IU]/mL — AB (ref 0.35–4.50)

## 2017-11-09 NOTE — Telephone Encounter (Signed)
Patient sent the following correspondence through [place]. Routing to triage to assist patient with request.  ----- Message from [place], Generic sent at 11/09/2017 10:01 AM EDT -----    How long before the meds might start working? The hot flashes seem to be getting worse.   Thanks  [name]

## 2017-11-09 NOTE — Patient Instructions (Signed)
Your recent gastrointestinal type symptoms for which you were seen in the emergency department has largely resolved presently.  You did have slight infection fighting cell count elevation and I do want to repeat this test today.  Also want to repeat metabolic panel for slight elevated liver enzyme.  In addition for your recent fatigue will include B12, B1, TSH and vitamin D level.  For nausea, can use either Phenergan or Zofran which you have.  Recommend Zofran during the day and Phenergan at night if needed.  If nausea still persisting past 3 days then please let us know.  For your history of hot flashes as well as some baseline tachycardia at rest, I want you to check your temperature when you are having hot flash.  Also check your pulse.  Would like to know if you actually have a fever during hot flash event and if your pulse is dropping into the low 100 range.  Continue gabapentin as your gynecologist recommended for hot flashes.  Follow-up date to be determined after lab review.

## 2017-11-09 NOTE — Telephone Encounter (Signed)
Spoke with patient. Patient states treated in ER on 11/02/17 for fever and emesis, increased HR noted during that visit. Patient has continued to have increase in hot flashes. Seen by PCP today, was advised ok to continue gabapentin as prescribed and to take temp during hot flashes, to r/o fever vs hot flash.  Patient states will f/u with cardiology for increased HR. Is awaiting additional lab results from PCP. Will increase to 300 mg nightly of gabapentin tonight.   Advised typically takes 4-6 weeks for full effect of medication. Patient will continue to monitor symptoms, return call with update after increasing to 300 mg nightly for 7 nights. Patient will f/u with PCP for fevers. Advised Dr. [name] Heck is out of the office, covering provider will review, I will return call with any additional recommendations.   Routing to covering  provider for final review. Patient is agreeable to disposition. Will close encounter.  Cc: Dr. [name] Heck

## 2017-11-09 NOTE — Progress Notes (Signed)
Subjective:    Patient ID: Carolyn Wilson, female    DOB: 01-29-1971, 47 y.o.   MRN: [id number]  HPI  Pt in for follow up from the ED.  On 11/02/2017 she was evaluated at the ED.  She had fever, abdomen cramping, vomiting and some mild tachycardia.   Symptoms last for about 2 days. By Tuesday evening symptoms improved. But she still feels tired.  Pt states recent med started for hot flashed by gyn is gabapentin. Pt does not check temp when has hot flash.  Last night was briefly nausea. Coincided with increase dose of gabapentin to 300 mg.  Pt has seen Dr. Bettina Gavia for high resting heart rate. But no palpitations. Pt does jog 5-6 miles about 5 days a week. Pt blood pressures are good. On pt fit bit her hr is usually 88-90.   Overall feels better but some fatigue since the above GI type illness. She never got diarrhea.  In ED. Pt had negative CT abdomen, and negative chest xray. One liver enzyme elevated minimal and wbc increase.    Review of Systems  Constitutional: Positive for fatigue. Negative for chills and fever.  HENT: Negative for congestion, ear pain, facial swelling, mouth sores, nosebleeds, rhinorrhea, sinus pressure, sinus pain and sore throat.   Respiratory: Negative for cough, chest tightness, shortness of breath and wheezing.   Cardiovascular: Negative for chest pain and palpitations.  Gastrointestinal: Negative for abdominal distention, anal bleeding and blood in stool.  Endocrine:       Has hot flashes. Sees gyn.  Genitourinary: Negative for difficulty urinating, dysuria, frequency, hematuria, menstrual problem and urgency.  Musculoskeletal: Negative for back pain, myalgias and neck stiffness.  Skin: Negative for rash.  Neurological: Negative for dizziness, seizures, speech difficulty, weakness and headaches.  Hematological: Negative for adenopathy. Does not bruise/bleed easily.  Psychiatric/Behavioral: Negative for behavioral problems, decreased  concentration, dysphoric mood and suicidal ideas. The patient is not nervous/anxious.        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    Past Medical History:  Diagnosis Date  . Abnormal Pap smear of cervix    h/o colposcopy/biopsy  . Allergy   . Asthma    HX  . Breast cancer ([place]) 2007  . Cancer ([place]) [id number]   ovarian  . Diabetes mellitus without complication ([place]) 51/7616    diagnosed by PCP, [name]   . GERD (gastroesophageal reflux disease)   . Hyperlipemia    on medication      Social History   Socioeconomic History  . Marital status: Married    Spouse name: Not on file  . Number of children: Not on file  . Years of education: Not on file  . Highest education level: Not on file  Occupational History  . Not on file  Social Needs  . Financial resource strain: Not on file  . Food insecurity:    Worry: Not on file    Inability: Not on file  . Transportation needs:    Medical: Not on file    Non-medical: Not on file  Tobacco Use  . Smoking status: Never Smoker  . Smokeless tobacco: Never Used  . Tobacco comment: NEVER USED TOBACCO  Substance and Sexual Activity  . Alcohol use: Yes    Alcohol/week: 1.2 oz    Types: 2 Standard drinks or equivalent per week  . Drug use: No  . Sexual activity: Yes    Partners: Male    Birth control/protection: Surgical  Lifestyle  . Physical activity:    Days per week: Not on file    Minutes per session: Not on file  . Stress: Not on file  Relationships  . Social connections:    Talks on phone: Not on file    Gets together: Not on file    Attends religious service: Not on file    Active member of club or organization: Not on file    Attends meetings of clubs or organizations: Not on file    Relationship status: Not on file  . Intimate partner violence:    Fear of current or ex partner: Not on file    Emotionally abused: Not on file    Physically abused: Not on file    Forced sexual activity: Not on file    Other Topics Concern  . Not on file  Social History Narrative  . Not on file    Past Surgical History:  Procedure Laterality Date  . APPENDECTOMY     age [age]  . BREAST LUMPECTOMY Left 2008   left breast  . CHOLECYSTECTOMY  2000  . ENDOMETRIAL ABLATION  2009  . LAPAROSCOPIC SALPINGO OOPHERECTOMY Left 1996   with right tubal ligation  . TUBAL LIGATION      Family History  Problem Relation Age of Onset  . Diabetes Mother   . Colon cancer Father   . Breast cancer Maternal Aunt   . Ovarian cancer Maternal Aunt   . Ovarian cancer Cousin   . Colon cancer Cousin   . Melanoma Cousin   . Breast cancer Maternal Grandmother   . Cancer Other        breast and ovarian-maternal side    Allergies  Allergen Reactions  . Latex Rash    Causes blisters  . Peanut-Containing Drug Products Anaphylaxis  . Sulfa Antibiotics Rash and Shortness Of Breath    Shortness of breath, rash Shortness of breath, rash  . Adhesive [Tape]   . Canagliflozin Other (See Comments)  . Reglan [Metoclopramide] Tinitus  . Ciprofloxacin Rash  . Sulfur Rash    Current Outpatient Medications on File Prior to Visit  Medication Sig Dispense Refill  . aspirin 81 MG tablet Take 81 mg by mouth daily.    [name] EPINEPHrine 0.3 mg/0.3 mL IJ SOAJ injection Inject once into muscle for any allergic reaction to peanut exposure 1 Device 1  . ezetimibe (ZETIA) 10 MG tablet TAKE ONE TABLET BY MOUTH ONE TIME DAILY 30 tablet 5  . gabapentin (NEURONTIN) 100 MG capsule Take 1 capsule nightly x 7 nights.  Then increase to 2 capsules nightly x 7 days.  Then increase to 3 capsules nightly. 63 capsule 0  . Lancets (ONETOUCH ULTRASOFT) lancets Check blood sugar daily as directed. 100 each 12  . metFORMIN (GLUCOPHAGE-XR) 500 MG 24 hr tablet Take 500 mg by mouth 2 (two) times daily.     [name] omeprazole (PRILOSEC) 20 MG capsule Take 1 capsule (20 mg total) by mouth daily. 30 capsule 5  . ondansetron (ZOFRAN ODT) 4 MG disintegrating tablet  Take 1 tablet (4 mg total) by mouth every 8 (eight) hours as needed for nausea or vomiting. 18 tablet 1  . ONETOUCH VERIO test strip TEST ONCE DAILY AS DIRECTED 100 each 11  . promethazine (PHENERGAN) 25 MG tablet Take 1 tablet (25 mg total) by mouth every 8 (eight) hours as needed for nausea or vomiting. 10 tablet 0   No current facility-administered medications on file prior to visit.  BP 127/87   Pulse 90   Temp 97.6 F (36.4 C) (Oral)   Resp 16   Ht 5\' 4"  (1.626 m)   Wt 159 lb (72.1 kg)   SpO2 100%   BMI 27.29 kg/m       Objective:   Physical Exam  General  Mental Status - Alert. General Appearance - Well groomed. Not in acute distress.  Skin Rashes- No Rashes.  HEENT Head- Normal. Ear Auditory Canal - Left- Normal. Right - Normal.Tympanic Membrane- Left- Normal. Right- Normal. Eye Sclera/Conjunctiva- Left- Normal. Right- Normal. Nose & Sinuses Nasal Mucosa- Left-  Not Boggy and Congested. Right-   Not Boggy and  Congested.Bilateral  No maxillary and  No frontal sinus pressure. Mouth & Throat Lips: Upper Lip- Normal: no dryness, cracking, pallor, cyanosis, or vesicular eruption. Lower Lip-Normal: no dryness, cracking, pallor, cyanosis or vesicular eruption. Buccal Mucosa- Bilateral- No Aphthous ulcers. Oropharynx- No Discharge or Erythema.  Tonsils: Characteristics- Bilateral- No Erythema or Congestion. Size/Enlargement- Bilateral- No enlargement. Discharge- bilateral-None.  Neck Neck- Supple. No Masses.  Chest and Lung Exam Auscultation: Breath Sounds:-Clear even and unlabored.  Cardiovascular Auscultation:Rythm- Regular, rate and rhythm. Murmurs & Other Heart Sounds:Ausculatation of the heart reveal- No Murmurs.  Lymphatic Head & Neck General Head & Neck Lymphatics: Bilateral: Description- No Localized lymphadenopathy.  Abdomen- soft, nontender, nondistended, positive bowel sounds, no rebound or guarding.  Back- no CVA tenderness.     Assessment  & Plan:  Your recent gastrointestinal type symptoms for which you were seen in the emergency department has largely resolved presently.  You did have slight infection fighting cell count elevation and I do want to repeat this test today.  Also want to repeat metabolic panel for slight elevated liver enzyme.  In addition for your recent fatigue will include B12, B1, TSH and vitamin D level.  For nausea, can use either Phenergan or Zofran which you have.  Recommend Zofran during the day and Phenergan at night if needed.  If nausea still persisting past 3 days then please let us know.  For your history of hot flashes as well as some baseline tachycardia at rest, I want you to check your temperature when you are having hot flash.  Also check your pulse.  Would like to know if you actually have a fever during hot flash event and if your pulse is dropping into the low 100 range.  Continue gabapentin as your gynecologist recommended for hot flashes.  Follow-up date to be determined after lab review.

## 2017-11-09 NOTE — Telephone Encounter (Signed)
Left message to call Sharee Pimple at [phone].   Also see MyChart message to patient.

## 2017-11-09 NOTE — Telephone Encounter (Signed)
Open to place future TSH and T4

## 2017-11-09 NOTE — Telephone Encounter (Signed)
Agree with the plan.

## 2017-11-10 ENCOUNTER — Encounter: Payer: Self-pay | Admitting: Medical

## 2017-11-12 LAB — VITAMIN B1: Vitamin B1 (Thiamine): 17 nmol/L (ref 8–30)

## 2017-11-14 ENCOUNTER — Other Ambulatory Visit: Payer: Self-pay | Admitting: Medical

## 2017-11-16 ENCOUNTER — Encounter: Payer: Self-pay | Admitting: Obstetrics & Gynecology

## 2017-11-16 ENCOUNTER — Other Ambulatory Visit: Payer: Self-pay | Admitting: Medical

## 2017-11-17 ENCOUNTER — Telehealth: Payer: Self-pay | Admitting: Obstetrics & Gynecology

## 2017-11-17 ENCOUNTER — Other Ambulatory Visit (INDEPENDENT_AMBULATORY_CARE_PROVIDER_SITE_OTHER): Payer: BC Managed Care – PPO

## 2017-11-17 DIAGNOSIS — R7989 Other specified abnormal findings of blood chemistry: Secondary | ICD-10-CM | POA: Diagnosis not present

## 2017-11-17 LAB — TSH: TSH: 3.91 u[IU]/mL (ref 0.35–4.50)

## 2017-11-17 LAB — T4, FREE: FREE T4: 0.83 ng/dL (ref 0.60–1.60)

## 2017-11-17 NOTE — Telephone Encounter (Signed)
[facility] is calling regarding patient's prescription for gabapentin. Pharmacy number is [phone]

## 2017-11-17 NOTE — Telephone Encounter (Signed)
Left message to call Kaitlyn at [phone]. 

## 2017-11-17 NOTE — Telephone Encounter (Signed)
If she is noticing the undissolved pills every day, maybe she could stop one pill for three or four days to figure out which one is not being absorbed.  Just a thought.

## 2017-11-17 NOTE — Telephone Encounter (Signed)
Pharmacist calling to report message from patient regarding Gabapentin. Advised this has been reviewed with the patient. Please see telephone encounter dated earlier today.  Routing to provider for final review. Patient agreeable to disposition. Will close encounter.

## 2017-11-17 NOTE — Telephone Encounter (Signed)
Patient sent the following correspondence through [place]. Routing to triage to assist patient with request.  ----- Message from [place], Generic sent at 11/16/2017 8:19 PM EDT -----    Hi my pharmacist told me to ask you all a question about my meds. Im asking regular doctor also. Could I call and speak to a nurse,

## 2017-11-17 NOTE — Telephone Encounter (Signed)
Spoke with patient. Patient states that she had hemorrhoid banding 4 1/2 weeks ago. Was advised to look for any blood in stool 4-5 weeks after banding. Patient has not seen any blood in stool, but has noticed partially dissolved pills. Was advised to speak with PCP and Dr.Miller. Unsure which pills are not being absorbed. Has started taking fiber daily. Takes Gabapentin at night. Takes Metformin in the morning. Reports she is still having hot flashes, but just started the medication 3 week ago. Requesting recommendations from [place].

## 2017-11-17 NOTE — Telephone Encounter (Signed)
Spoke with patient. Advised of message as seen below from [place]. Patient verbalizes understanding. Will try this and call back with update after 3-4 days.  Routing to provider for final review. Patient agreeable to disposition. Will close encounter.

## 2017-11-18 ENCOUNTER — Encounter: Payer: Self-pay | Admitting: Medical

## 2017-11-21 ENCOUNTER — Encounter: Payer: Self-pay | Admitting: Obstetrics & Gynecology

## 2017-11-23 ENCOUNTER — Other Ambulatory Visit: Payer: Self-pay | Admitting: Obstetrics & Gynecology

## 2017-11-23 MED ORDER — GABAPENTIN 300 MG PO CAPS: ORAL_CAPSULE | ORAL | 8 refills | Status: DC

## 2017-11-23 NOTE — Telephone Encounter (Signed)
-----   Message from [place], Generic sent at 11/22/2017 7:03 PM EDT -----    Thanks! I think the meds was for the hot flashes. My prescription ran out for those.    ----- Message -----  From: Megan Salon, MD  Sent: 11/22/2017 2:39 PM EDT  To: Cephus Shelling Cargo  Subject: RE: Visit Follow-Up Question  Glad you figured that out. I haven't gotten a refill request but I haven't written that for you in the past. It has come from Dr. Harvie Heck in the past so maybe it was sent to him. Thanks for the update.    Edwinna Areola      ----- Message -----   From: Cephus Shelling [place]ady[place]   Sent: 11/21/2017 3:01 PM EDT    To: Megan Salon, MD  Subject: Visit Follow-Up Question    Just wanted to let you know it appears to be the Metformin that is in my stool when I go to bathroom. I think my pharmacist is requesting a refill from you.  Thanks  [name]

## 2017-11-23 NOTE — Telephone Encounter (Addendum)
Patient notified that RX was sent to pharmacy with refills. Patient verbalizes understanding. Encounter closed.

## 2017-11-23 NOTE — Telephone Encounter (Signed)
Patient called with an update since starting Gabapentin. She is currently taking 300mg  daily. Patient states some days she has no hot flashes at all but other days she's not as lucky. She understands this may take a little time to feel the full affect. Patient is requesting a refill. Routing to provider

## 2017-11-23 NOTE — Telephone Encounter (Addendum)
[facility] calling to check status of the gabapentin prescription. Pharmacy number is [phone].

## 2017-11-25 ENCOUNTER — Encounter: Payer: Self-pay | Admitting: Medical

## 2017-11-25 ENCOUNTER — Ambulatory Visit: Payer: BC Managed Care – PPO | Admitting: Medical

## 2017-11-25 ENCOUNTER — Ambulatory Visit (HOSPITAL_BASED_OUTPATIENT_CLINIC_OR_DEPARTMENT_OTHER)
Admission: RE | Admit: 2017-11-25 | Discharge: 2017-11-25 | Disposition: A | Discharge status: Home / Self Care | Payer: BC Managed Care – PPO | Source: Ambulatory Visit | Attending: Medical | Admitting: Medical

## 2017-11-25 VITALS — BP 114/89 | HR 100 | Temp 98.1°F | Resp 16 | Ht 64.0 in | Wt 160.6 lb

## 2017-11-25 DIAGNOSIS — M79671 Pain in right foot: Secondary | ICD-10-CM | POA: Insufficient documentation

## 2017-11-25 DIAGNOSIS — M2550 Pain in unspecified joint: Secondary | ICD-10-CM

## 2017-11-25 DIAGNOSIS — R5383 Other fatigue: Secondary | ICD-10-CM | POA: Diagnosis not present

## 2017-11-25 DIAGNOSIS — R509 Fever, unspecified: Secondary | ICD-10-CM | POA: Insufficient documentation

## 2017-11-25 DIAGNOSIS — R748 Abnormal levels of other serum enzymes: Secondary | ICD-10-CM

## 2017-11-25 DIAGNOSIS — R000 Tachycardia, unspecified: Secondary | ICD-10-CM | POA: Diagnosis not present

## 2017-11-25 NOTE — Patient Instructions (Addendum)
You do have some documented fever.  Source of fever not yet determined.  I think we will start target approach with getting chest x-ray, inflammatory studies, CBC, CMP, TSH and pancreas enzymes.  I do want you to continue to follow-up with your gynecologist.  Considering doing urine metanephrine studies since you did report some tachycardia and sweats at times.  Holding off on this study presently.  If you find any ticks on your body please let me know as I would order take my studies to look for Lyme or RMSF.  Consider getting UA today but no urinary tract symptoms and no history of UTIs in the past.  Follow-up date to be determined after lab review

## 2017-11-25 NOTE — Progress Notes (Signed)
Subjective:    Patient ID: Carolyn Wilson, female    DOB: 27-Sep-1970, 47 y.o.   MRN: [id number]  HPI  Pt is getting some low level fever 99-100.5.Associates with hot flashes. Pt has known fsh level increase. Pt is taking gabapentin 900 mg at night for hot flashes as prescribed by her gyn   Pt thinks has been on gabapentin for 5 weeks. Pt was having hot flashes for a while before she knew she had fevers. She has not been checking her temperature. I jsut recently asked her to start checking her temps.  Pt had uterine ablation and one ovary(hx of ovarian cancer). Pt has one ovary and it is normal size. Pt gets antigen studies and states negative. CT abdomen and pelvis showed no masses. In April. This was done in context of vomiting illness.   3 weeks ago she had lipase mild elevation.  Fatigue but working a lot. Some 16 hour days.  Pt blood pressures not elevated. With low grade fevers pulse go up to about 120.  No history of any tick bites.   Rt foot pain recently. No trauma. No recent exercise. Pain for 3-4 days.   Review of Systems  Constitutional: Positive for diaphoresis, fatigue and fever. Negative for chills.  HENT: Negative for congestion, ear pain, facial swelling and hearing loss.   Respiratory: Negative for cough, chest tightness, shortness of breath and wheezing.   Cardiovascular: Negative for chest pain and palpitations.  Gastrointestinal: Negative for abdominal pain.  Endocrine: Negative for cold intolerance and heat intolerance.  Genitourinary: Negative for decreased urine volume, difficulty urinating, dyspareunia, enuresis, pelvic pain, vaginal discharge and vaginal pain.  Musculoskeletal: Negative for back pain and gait problem.       Rt foot pain.  Neurological: Negative for seizures, syncope, facial asymmetry, weakness and headaches.  Hematological: Negative for adenopathy. Does not bruise/bleed easily.  Psychiatric/Behavioral: Negative for behavioral problems  and confusion.    Past Medical History:  Diagnosis Date  . Abnormal Pap smear of cervix    h/o colposcopy/biopsy  . Allergy   . Asthma    HX  . Breast cancer ([place]) 2007  . Cancer ([place]) [id number]   ovarian  . Diabetes mellitus without complication ([place]) 92/4268    diagnosed by PCP, [name]   . GERD (gastroesophageal reflux disease)   . Hyperlipemia    on medication      Social History   Socioeconomic History  . Marital status: Married    Spouse name: Not on file  . Number of children: Not on file  . Years of education: Not on file  . Highest education level: Not on file  Occupational History  . Not on file  Social Needs  . Financial resource strain: Not on file  . Food insecurity:    Worry: Not on file    Inability: Not on file  . Transportation needs:    Medical: Not on file    Non-medical: Not on file  Tobacco Use  . Smoking status: Never Smoker  . Smokeless tobacco: Never Used  . Tobacco comment: NEVER USED TOBACCO  Substance and Sexual Activity  . Alcohol use: Yes    Alcohol/week: 1.2 oz    Types: 2 Standard drinks or equivalent per week  . Drug use: No  . Sexual activity: Yes    Partners: Male    Birth control/protection: Surgical  Lifestyle  . Physical activity:    Days per week: Not on file  Minutes per session: Not on file  . Stress: Not on file  Relationships  . Social connections:    Talks on phone: Not on file    Gets together: Not on file    Attends religious service: Not on file    Active member of club or organization: Not on file    Attends meetings of clubs or organizations: Not on file    Relationship status: Not on file  . Intimate partner violence:    Fear of current or ex partner: Not on file    Emotionally abused: Not on file    Physically abused: Not on file    Forced sexual activity: Not on file  Other Topics Concern  . Not on file  Social History Narrative  . Not on file    Past Surgical History:  Procedure  Laterality Date  . APPENDECTOMY     age [age]  . BREAST LUMPECTOMY Left 2008   left breast  . CHOLECYSTECTOMY  2000  . ENDOMETRIAL ABLATION  2009  . LAPAROSCOPIC SALPINGO OOPHERECTOMY Left 1996   with right tubal ligation  . TUBAL LIGATION      Family History  Problem Relation Age of Onset  . Diabetes Mother   . Colon cancer Father   . Breast cancer Maternal Aunt   . Ovarian cancer Maternal Aunt   . Ovarian cancer Cousin   . Colon cancer Cousin   . Melanoma Cousin   . Breast cancer Maternal Grandmother   . Cancer Other        breast and ovarian-maternal side    Allergies  Allergen Reactions  . Latex Rash    Causes blisters  . Peanut-Containing Drug Products Anaphylaxis  . Sulfa Antibiotics Rash and Shortness Of Breath    Shortness of breath, rash Shortness of breath, rash  . Adhesive [Tape]   . Canagliflozin Other (See Comments)  . Reglan [Metoclopramide] Tinitus  . Ciprofloxacin Rash  . Sulfur Rash    Current Outpatient Medications on File Prior to Visit  Medication Sig Dispense Refill  . aspirin 81 MG tablet Take 81 mg by mouth daily.    [name] EPINEPHrine 0.3 mg/0.3 mL IJ SOAJ injection Inject once into muscle for any allergic reaction to peanut exposure 1 Device 1  . ezetimibe (ZETIA) 10 MG tablet TAKE ONE TABLET BY MOUTH ONE TIME DAILY 30 tablet 5  . gabapentin (NEURONTIN) 300 MG capsule Take 3 tablets nightly 30 capsule 8  . Lancets (ONETOUCH ULTRASOFT) lancets Check blood sugar daily as directed. 100 each 12  . metFORMIN (GLUCOPHAGE-XR) 500 MG 24 hr tablet Take 500 mg by mouth 2 (two) times daily.     [name] omeprazole (PRILOSEC) 20 MG capsule TAKE ONE CAPSULE BY MOUTH ONE TIME DAILY 30 capsule 3  . omeprazole (PRILOSEC) 20 MG capsule TAKE ONE CAPSULE BY MOUTH ONE TIME DAILY 30 capsule 5  . ondansetron (ZOFRAN ODT) 4 MG disintegrating tablet Take 1 tablet (4 mg total) by mouth every 8 (eight) hours as needed for nausea or vomiting. 18 tablet 1  . ONETOUCH VERIO test  strip TEST ONCE DAILY AS DIRECTED 100 each 11  . promethazine (PHENERGAN) 25 MG tablet Take 1 tablet (25 mg total) by mouth every 8 (eight) hours as needed for nausea or vomiting. 10 tablet 0   No current facility-administered medications on file prior to visit.     BP 114/89   Pulse 100   Temp 98.1 F (36.7 C) (Oral)   Resp 16  Ht 5\' 4"  (1.626 m)   Wt 160 lb 9.6 oz (72.8 kg)   SpO2 100%   BMI 27.57 kg/m       Objective:   Physical Exam  General  Mental Status - Alert. General Appearance - Well groomed. Not in acute distress.  Skin Rashes- No Rashes.  HEENT Head- Normal. Ear Auditory Canal - Left- Normal. Right - Normal.Tympanic Membrane- Left- Normal. Right- Normal. Eye Sclera/Conjunctiva- Left- Normal. Right- Normal. Nose & Sinuses Nasal Mucosa- Left-  Not Boggy and Congested. Right- Not  Boggy and  Congested.Bilateral  No maxillary and no  frontal sinus pressure. Mouth & Throat Lips: Upper Lip- Normal: no dryness, cracking, pallor, cyanosis, or vesicular eruption. Lower Lip-Normal: no dryness, cracking, pallor, cyanosis or vesicular eruption. Buccal Mucosa- Bilateral- No Aphthous ulcers. Oropharynx- No Discharge or Erythema. Tonsils: Characteristics- Bilateral- No Erythema or Congestion. Size/Enlargement- Bilateral- No enlargement. Discharge- bilateral-None.  Neck Neck- Supple. No Masses.   Chest and Lung Exam Auscultation: Breath Sounds:-Clear even and unlabored.  Cardiovascular Auscultation:Rythm- Regular, rate and rhythm. Murmurs & Other Heart Sounds:Ausculatation of the heart reveal- No Murmurs.  Lymphatic Head & Neck General Head & Neck Lymphatics: Bilateral: Description- No Localized lymphadenopathy.   Neurologic Cranial Nerve exam:- CN III-XII intact(No nystagmus), symmetric smile. Drift Test:- No drift. Romberg Exam:- Negative.  Heal to Toe Gait exam:-Normal. Finger to Nose:- Normal/Intact Strength:- 5/5 equal and symmetric strength both  upper and lower extremities.  Rt feet- faint tender to palpation on top of foot. Distal metatarsals.    Assessment & Plan:  You do have some documented fever.  Source of fever not yet determined.  I think we will start target approach with getting chest x-ray, inflammatory studies, CBC, CMP, TSH and pancreas enzymes.  I do want you to continue to follow-up with your gynecologist.  Considering doing urine metanephrine studies since you did report some tachycardia and sweats at times.  Holding off on this study presently.  If you find any ticks on your body please let me know as I would order take my studies to look for Lyme or RMSF.  Consider getting UA today but no urinary tract symptoms and no history of UTIs in the past.  Follow-up date to be determined after lab review.  Mackie Pai, PA-C

## 2017-11-26 LAB — COMPREHENSIVE METABOLIC PANEL
ALK PHOS: 71 U/L (ref 39–117)
ALT: 40 U/L — ABNORMAL HIGH (ref 0–35)
AST: 25 U/L (ref 0–37)
Albumin: 4.4 g/dL (ref 3.5–5.2)
BUN: 7 mg/dL (ref 6–23)
CO2: 32 meq/L (ref 19–32)
Calcium: 9.9 mg/dL (ref 8.4–10.5)
Chloride: 101 mEq/L (ref 96–112)
Creatinine, Ser: 0.74 mg/dL (ref 0.40–1.20)
GFR: 89.63 mL/min (ref >60.00)
GLUCOSE: 89 mg/dL (ref 70–99)
POTASSIUM: 4.1 meq/L (ref 3.5–5.1)
SODIUM: 141 meq/L (ref 135–145)
Total Bilirubin: 0.4 mg/dL (ref 0.2–1.2)
Total Protein: 7.6 g/dL (ref 6.0–8.3)

## 2017-11-26 LAB — CBC WITH DIFFERENTIAL/PLATELET
BASOS ABS: 0.1 10*3/uL (ref 0.0–0.1)
BASOS PCT: 1.2 % (ref 0.0–3.0)
Eosinophils Absolute: 0.1 10*3/uL (ref 0.0–0.7)
Eosinophils Relative: 1.7 % (ref 0.0–5.0)
HCT: 40.8 % (ref 36.0–46.0)
Hemoglobin: 13.7 g/dL (ref 12.0–15.0)
LYMPHS PCT: 37.2 % (ref 12.0–46.0)
Lymphs Abs: 2.8 10*3/uL (ref 0.7–4.0)
MCHC: 33.5 g/dL (ref 30.0–36.0)
MCV: 88.4 fl (ref 78.0–100.0)
MONOS PCT: 6.2 % (ref 3.0–12.0)
Monocytes Absolute: 0.5 10*3/uL (ref 0.1–1.0)
NEUTROS ABS: 4.1 10*3/uL (ref 1.4–7.7)
NEUTROS PCT: 53.7 % (ref 43.0–77.0)
PLATELETS: 309 10*3/uL (ref 150.0–400.0)
RBC: 4.62 Mil/uL (ref 3.87–5.11)
RDW: 13.7 % (ref 11.5–15.5)
WBC: 7.6 10*3/uL (ref 4.0–10.5)

## 2017-11-26 LAB — C-REACTIVE PROTEIN: CRP: 0.2 mg/dL — AB (ref 0.5–20.0)

## 2017-11-26 LAB — AMYLASE: Amylase: 55 U/L (ref 27–131)

## 2017-11-26 LAB — URIC ACID: Uric Acid, Serum: 5.6 mg/dL (ref 2.4–7.0)

## 2017-11-26 LAB — TSH: TSH: 1.96 u[IU]/mL (ref 0.35–4.50)

## 2017-11-26 LAB — SEDIMENTATION RATE: SED RATE: 13 mm/h (ref 0–20)

## 2017-11-26 LAB — LIPASE: Lipase: 50 U/L (ref 11.0–59.0)

## 2017-11-27 LAB — RHEUMATOID FACTOR: Rhuematoid fact SerPl-aCnc: <14 IU/mL (ref <14)

## 2017-11-27 LAB — ANA: Anti Nuclear Antibody(ANA): NEGATIVE

## 2018-01-10 ENCOUNTER — Other Ambulatory Visit: Payer: Self-pay | Admitting: Obstetrics & Gynecology

## 2018-01-10 ENCOUNTER — Encounter: Payer: Self-pay | Admitting: Obstetrics & Gynecology

## 2018-01-11 ENCOUNTER — Other Ambulatory Visit: Payer: Self-pay | Admitting: Obstetrics & Gynecology

## 2018-01-11 ENCOUNTER — Telehealth: Payer: Self-pay | Admitting: Obstetrics & Gynecology

## 2018-01-11 MED ORDER — GABAPENTIN 300 MG PO CAPS: ORAL_CAPSULE | ORAL | 8 refills | Status: DC

## 2018-01-11 NOTE — Telephone Encounter (Signed)
Called pt personally.  She is going to decrease back to 600mg  nightly and give me update if has any new symptoms or changes.  Ok to close encounter.

## 2018-01-11 NOTE — Telephone Encounter (Signed)
Patient sent the following correspondence through [place]. Routing to doctor to assist patient with request.  ----- Message from [place], Generic sent at 01/11/2018 8:17 AM EDT -----    I am doing 3 300mg  tablets at night for a total of 900mg .  Thanks!    ----- Message -----  From: Carolyn Salon, MD  Sent: 01/10/2018 10:31 PM EDT  To: Carolyn Shelling Diener  Subject: RE: Visit Follow-Up Question  Of course I can switch this to a 30 day supply. Are you taking 300mg  nightly? Just want to make sure I send in the correct dosage. Just let me know and I will take care of it. Thanks.    Carolyn Wilson      ----- Message -----   From: Carolyn Wilson   Sent: 01/10/2018 3:18 PM EDT    To: Carolyn Salon, MD  Subject: Visit Follow-Up Question    The meds are working great for the hot flashes. Is there a way I can get it for a month at a time right now it's filled every 10 days.   Thanks.  Carolyn Wilson

## 2018-02-13 ENCOUNTER — Other Ambulatory Visit: Payer: Self-pay | Admitting: Medical

## 2018-02-24 ENCOUNTER — Ambulatory Visit: Payer: BC Managed Care – PPO | Admitting: Hematology & Oncology

## 2018-02-24 ENCOUNTER — Other Ambulatory Visit: Payer: BC Managed Care – PPO

## 2018-03-02 ENCOUNTER — Encounter: Payer: Self-pay | Admitting: Hematology & Oncology

## 2018-03-02 ENCOUNTER — Other Ambulatory Visit: Payer: Self-pay

## 2018-03-02 ENCOUNTER — Inpatient Hospital Stay (HOSPITAL_BASED_OUTPATIENT_CLINIC_OR_DEPARTMENT_OTHER): Payer: 59 | Admitting: Hematology & Oncology

## 2018-03-02 ENCOUNTER — Inpatient Hospital Stay: Payer: 59 | Attending: Hematology & Oncology

## 2018-03-02 VITALS — BP 135/78 | HR 77 | Temp 98.2°F | Resp 18 | Wt 168.0 lb

## 2018-03-02 DIAGNOSIS — Z8543 Personal history of malignant neoplasm of ovary: Secondary | ICD-10-CM | POA: Diagnosis present

## 2018-03-02 DIAGNOSIS — M25511 Pain in right shoulder: Secondary | ICD-10-CM

## 2018-03-02 DIAGNOSIS — Z794 Long term (current) use of insulin: Secondary | ICD-10-CM

## 2018-03-02 DIAGNOSIS — Z79899 Other long term (current) drug therapy: Secondary | ICD-10-CM | POA: Diagnosis not present

## 2018-03-02 DIAGNOSIS — G8929 Other chronic pain: Secondary | ICD-10-CM

## 2018-03-02 DIAGNOSIS — E0800 Diabetes mellitus due to underlying condition with hyperosmolarity without nonketotic hyperglycemic-hyperosmolar coma (NKHHC): Secondary | ICD-10-CM

## 2018-03-02 DIAGNOSIS — Z803 Family history of malignant neoplasm of breast: Secondary | ICD-10-CM

## 2018-03-02 LAB — CMP (CANCER CENTER ONLY)
ALBUMIN: 3.5 g/dL (ref 3.5–5.0)
ALK PHOS: 67 U/L (ref 26–84)
ALT: 28 U/L (ref 10–47)
AST: 31 U/L (ref 11–38)
Anion gap: 8 (ref 5–15)
BUN: 8 mg/dL (ref 7–22)
CHLORIDE: 105 mmol/L (ref 98–108)
CO2: 29 mmol/L (ref 18–33)
CREATININE: 0.8 mg/dL (ref 0.60–1.20)
Calcium: 9.1 mg/dL (ref 8.0–10.3)
GLUCOSE: 119 mg/dL — AB (ref 73–118)
POTASSIUM: 3.5 mmol/L (ref 3.3–4.7)
SODIUM: 142 mmol/L (ref 128–145)
Total Bilirubin: 0.6 mg/dL (ref 0.2–1.6)
Total Protein: 6.8 g/dL (ref 6.4–8.1)

## 2018-03-02 LAB — CBC WITH DIFFERENTIAL (CANCER CENTER ONLY)
BASOS PCT: 1 %
Basophils Absolute: 0 10*3/uL (ref 0.0–0.1)
EOS ABS: 0.2 10*3/uL (ref 0.0–0.5)
EOS PCT: 3 %
HCT: 37.4 % (ref 34.8–46.6)
HEMOGLOBIN: 11.9 g/dL (ref 11.6–15.9)
Lymphocytes Relative: 31 %
Lymphs Abs: 2.2 10*3/uL (ref 0.9–3.3)
MCH: 28.4 pg (ref 26.0–34.0)
MCHC: 31.8 g/dL — AB (ref 32.0–36.0)
MCV: 89.3 fL (ref 81.0–101.0)
MONOS PCT: 7 %
Monocytes Absolute: 0.5 10*3/uL (ref 0.1–0.9)
NEUTROS PCT: 58 %
Neutro Abs: 4.1 10*3/uL (ref 1.5–6.5)
PLATELETS: 312 10*3/uL (ref 145–400)
RBC: 4.19 MIL/uL (ref 3.70–5.32)
RDW: 13.2 % (ref 11.1–15.7)
WBC Count: 7 10*3/uL (ref 3.9–10.0)

## 2018-03-02 NOTE — Progress Notes (Signed)
Hematology and Oncology Follow Up Visit  Carolyn Wilson [id number] 1971-06-28 47 y.o. 03/02/2018   Principle Diagnosis:  History of Sertoli-Leydig tumor of the left ovary  Strong family history of breast and ovarian cancer - BRCA 1/2 and p53 negative  Current Therapy:   Observation    Interim History:  Carolyn Wilson is back for follow-up.  She is doing well.  She has a new job now.  This is causing a lot less stress for Carolyn.  She is now the Carolyn Wilson of a [employer].  The hours are much better.  She has more time as she can be with Carolyn family.  Carolyn Wilson passed away a month ago.  He was 47 years old.  He had kidney failure.  She is going up to his farm to help out on the weekends.  Carolyn Wilson graduated from [employer].  He is down here in [place] at the police academy and then will head back up to [place], [place] for [facility] training.  She is watch Carolyn blood sugars.  She is gained some weight.  She is trying to exercise a little bit more.  She says she is due for mammogram coming up.  Carolyn Wilson is having some problems.  Carolyn Wilson apparently has some anemia.  She was just transfused.   Carolyn overall, Carolyn performance status is ECOG 0.  Medications:  Allergies as of 03/02/2018      Reactions   Latex Rash   Causes blisters   Peanut-containing Drug Products Anaphylaxis   Sulfa Antibiotics Rash, Shortness Of Breath   Shortness of breath, rash Shortness of breath, rash   Adhesive [tape]    Canagliflozin Other (See Comments)   Reglan [metoclopramide] Tinitus   Ciprofloxacin Rash   Sulfur Rash      Medication List        Accurate as of 03/02/18  9:16 AM. Always use your most recent med list.          aspirin 81 MG tablet Take 81 mg by mouth daily.   EPINEPHrine 0.3 mg/0.3 mL Soaj injection Commonly known as:  EPI-PEN Inject once into muscle for any allergic reaction to peanut exposure   ezetimibe 10 MG tablet Commonly known as:  ZETIA TAKE ONE TABLET BY MOUTH ONE  TIME DAILY   gabapentin 300 MG capsule Commonly known as:  NEURONTIN Take 3 tablets nightly   metFORMIN 500 MG 24 hr tablet Commonly known as:  GLUCOPHAGE-XR Take 500 mg by mouth 2 (two) times daily.   omeprazole 20 MG capsule Commonly known as:  PRILOSEC TAKE ONE CAPSULE BY MOUTH ONE TIME DAILY   omeprazole 20 MG capsule Commonly known as:  PRILOSEC TAKE ONE CAPSULE BY MOUTH ONE TIME DAILY   ondansetron 4 MG disintegrating tablet Commonly known as:  ZOFRAN-ODT Take 1 tablet (4 mg total) by mouth every 8 (eight) hours as needed for nausea or vomiting.   onetouch ultrasoft lancets Check blood sugar daily as directed.   ONETOUCH VERIO test strip Generic drug:  glucose blood TEST ONCE DAILY AS DIRECTED   promethazine 25 MG tablet Commonly known as:  PHENERGAN Take 1 tablet (25 mg total) by mouth every 8 (eight) hours as needed for nausea or vomiting.       Allergies:  Allergies  Allergen Reactions  . Latex Rash    Causes blisters  . Peanut-Containing Drug Products Anaphylaxis  . Sulfa Antibiotics Rash and Shortness Of Breath    Shortness of breath,  rash Shortness of breath, rash  . Adhesive [Tape]   . Canagliflozin Other (See Comments)  . Reglan [Metoclopramide] Tinitus  . Ciprofloxacin Rash  . Sulfur Rash    Past Medical History, Surgical history, Social history, and Family History were reviewed and updated.  Review of Systems: Review of Systems  Constitutional: Negative.   HENT: Negative.   Eyes: Negative.   Respiratory: Negative.   Cardiovascular: Positive for palpitations.  Gastrointestinal: Negative.   Genitourinary: Negative.   Musculoskeletal: Negative.   Skin: Positive for rash.  Neurological: Negative.   Endo/Heme/Allergies: Negative.   Psychiatric/Behavioral: Negative.      Physical Exam:  weight is 168 lb (76.2 kg). Carolyn oral temperature is 98.2 F (36.8 C). Carolyn blood pressure is 135/78 and Carolyn pulse is 77. Carolyn respiration is 18 and  oxygen saturation is 100%.   Wt Readings from Last 3 Encounters:  03/02/18 168 lb (76.2 kg)  11/25/17 160 lb 9.6 oz (72.8 kg)  11/09/17 159 lb (72.1 kg)    Physical Exam  Constitutional: She is oriented to person, place, and time.  HENT:  Head: Normocephalic and atraumatic.  Mouth/Throat: Oropharynx is clear and moist.  Eyes: Pupils are equal, round, and reactive to light. EOM are normal.  Neck: Normal range of motion.  Cardiovascular: Normal rate, regular rhythm and normal heart sounds.  Pulmonary/Chest: Effort normal and breath sounds normal.  Abdominal: Soft. Bowel sounds are normal.  Musculoskeletal: Normal range of motion. She exhibits no edema, tenderness or deformity.  Lymphadenopathy:    She has no cervical adenopathy.  Neurological: She is alert and oriented to person, place, and time.  Skin: Skin is warm and dry. No rash noted. No erythema.  Psychiatric: She has a normal mood and affect. Carolyn behavior is normal. Judgment and thought content normal.  Vitals reviewed.  .   Lab Results  Component Value Date   WBC 7.0 03/02/2018   HGB 11.9 03/02/2018   HCT 37.4 03/02/2018   MCV 89.3 03/02/2018   PLT 312 03/02/2018   No results found for: FERRITIN, IRON, TIBC, UIBC, IRONPCTSAT Lab Results  Component Value Date   RBC 4.19 03/02/2018   No results found for: KPAFRELGTCHN, LAMBDASER, KAPLAMBRATIO No results found for: IGGSERUM, IGA, IGMSERUM No results found for: Odetta Pink, SPEI   Chemistry      Component Value Date/Time   NA 142 03/02/2018 0831   NA 144 03/11/2017 0822   K 3.5 03/02/2018 0831   K 4.5 03/11/2017 0822   CL 105 03/02/2018 0831   CL 104 03/11/2017 0822   CO2 29 03/02/2018 0831   CO2 29 03/11/2017 0822   BUN 8 03/02/2018 0831   BUN 9 03/11/2017 0822   CREATININE 0.80 03/02/2018 0831   CREATININE 1.2 03/11/2017 0822      Component Value Date/Time   CALCIUM 9.1 03/02/2018 0831   CALCIUM  9.2 03/11/2017 0822   ALKPHOS 67 03/02/2018 0831   ALKPHOS 65 03/11/2017 0822   AST 31 03/02/2018 0831   ALT 28 03/02/2018 0831   ALT 25 03/11/2017 0822   BILITOT 0.6 03/02/2018 0831     Impression and Plan: Carolyn Wilson is a 48 yo post-menopausal female with a 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   I do not see any issues from my point of view.  Of note, she had a cardiac CT scan done back in March.  She had  a excellent coronary calcium score of 0.  She was right dominant.  There is no evidence of coronary artery disease.  We will plan to get Carolyn back in 6 more months.  When we see Carolyn back, we will do Carolyn breast exam.   Volanda Napoleon, MD 8/13/20199:16 AM

## 2018-03-08 ENCOUNTER — Encounter: Payer: Self-pay | Admitting: Medical

## 2018-03-08 ENCOUNTER — Ambulatory Visit: Payer: 59 | Admitting: Medical

## 2018-03-08 VITALS — BP 138/81 | HR 89 | Temp 97.6°F | Resp 16 | Ht 64.0 in | Wt 165.8 lb

## 2018-03-08 DIAGNOSIS — E785 Hyperlipidemia, unspecified: Secondary | ICD-10-CM

## 2018-03-08 DIAGNOSIS — E119 Type 2 diabetes mellitus without complications: Secondary | ICD-10-CM | POA: Diagnosis not present

## 2018-03-08 DIAGNOSIS — H6690 Otitis media, unspecified, unspecified ear: Secondary | ICD-10-CM | POA: Diagnosis not present

## 2018-03-08 DIAGNOSIS — T7840XA Allergy, unspecified, initial encounter: Secondary | ICD-10-CM

## 2018-03-08 DIAGNOSIS — H60509 Unspecified acute noninfective otitis externa, unspecified ear: Secondary | ICD-10-CM

## 2018-03-08 DIAGNOSIS — E1169 Type 2 diabetes mellitus with other specified complication: Secondary | ICD-10-CM

## 2018-03-08 MED ORDER — AZITHROMYCIN 250 MG PO TABS: ORAL_TABLET | ORAL | 0 refills | Status: DC

## 2018-03-08 MED ORDER — NEOMYCIN-POLYMYXIN-HC 1 % OT SOLN: 3.0000 [drp] | Freq: Four times a day (QID) | OTIC | 0 refills | Status: DC

## 2018-03-08 NOTE — Progress Notes (Signed)
Subjective:    Patient ID: Carolyn Wilson, female    DOB: 1971/04/04, 47 y.o.   MRN: [id number]  HPI  Pt in for left ear pain for about 3 days. Mild started but gradually feeling worse. She states last night in shower got severe pain. She has not been swimming over last week. No nasal congestion recently preceding ear pain.  No fever, no chills or sweats.    Pt did report severe reaction to bite or sting by something in [place] 3 weeks ago. Eyes swollen and mild sob. She took benadryl and got better quickly. Peanut allergy but did not have to use her epi-pen. Pt never had any other severe type allergy in the past other than her peanut allergy     Review of Systems  Constitutional: Negative for chills, fatigue and fever.  HENT: Positive for ear pain. Negative for congestion.   Respiratory: Negative for cough and chest tightness.   Cardiovascular: Negative for chest pain and palpitations.  Gastrointestinal: Negative for abdominal pain.  Musculoskeletal: Negative for back pain.  Skin: Negative for rash.  Neurological: Negative for dizziness, seizures, light-headedness and headaches.  Hematological: Negative for adenopathy. Does not bruise/bleed easily.  Psychiatric/Behavioral: Negative for behavioral problems and confusion.   Past Medical History:  Diagnosis Date  . Abnormal Pap smear of cervix    h/o colposcopy/biopsy  . Allergy   . Asthma    HX  . Breast cancer ([place]) 2007  . Cancer ([place]) [id number]   ovarian  . Diabetes mellitus without complication ([place]) 50/2774    diagnosed by PCP, [name]   . GERD (gastroesophageal reflux disease)   . Hyperlipemia    on medication      Social History   Socioeconomic History  . Marital status: Married    Spouse name: Not on file  . Number of children: Not on file  . Years of education: Not on file  . Highest education level: Not on file  Occupational History  . Not on file  Social Needs  . Financial resource strain:  Not on file  . Food insecurity:    Worry: Not on file    Inability: Not on file  . Transportation needs:    Medical: Not on file    Non-medical: Not on file  Tobacco Use  . Smoking status: Never Smoker  . Smokeless tobacco: Never Used  . Tobacco comment: NEVER USED TOBACCO  Substance and Sexual Activity  . Alcohol use: Yes    Alcohol/week: 2.0 standard drinks    Types: 2 Standard drinks or equivalent per week  . Drug use: No  . Sexual activity: Yes    Partners: Male    Birth control/protection: Surgical  Lifestyle  . Physical activity:    Days per week: Not on file    Minutes per session: Not on file  . Stress: Not on file  Relationships  . Social connections:    Talks on phone: Not on file    Gets together: Not on file    Attends religious service: Not on file    Active member of club or organization: Not on file    Attends meetings of clubs or organizations: Not on file    Relationship status: Not on file  . Intimate partner violence:    Fear of current or ex partner: Not on file    Emotionally abused: Not on file    Physically abused: Not on file    Forced sexual activity: Not  on file  Other Topics Concern  . Not on file  Social History Narrative  . Not on file    Past Surgical History:  Procedure Laterality Date  . APPENDECTOMY     age [age]  . BREAST LUMPECTOMY Left 2008   left breast  . CHOLECYSTECTOMY  2000  . ENDOMETRIAL ABLATION  2009  . LAPAROSCOPIC SALPINGO OOPHERECTOMY Left 1996   with right tubal ligation  . TUBAL LIGATION      Family History  Problem Relation Age of Onset  . Diabetes Mother   . Colon cancer Father   . Breast cancer Maternal Aunt   . Ovarian cancer Maternal Aunt   . Ovarian cancer Cousin   . Colon cancer Cousin   . Melanoma Cousin   . Breast cancer Maternal Grandmother   . Cancer Other        breast and ovarian-maternal side    Allergies  Allergen Reactions  . Latex Rash    Causes blisters  . Peanut-Containing Drug  Products Anaphylaxis  . Sulfa Antibiotics Rash and Shortness Of Breath    Shortness of breath, rash Shortness of breath, rash  . Adhesive [Tape]   . Canagliflozin Other (See Comments)  . Reglan [Metoclopramide] Tinitus  . Ciprofloxacin Rash  . Sulfur Rash    Current Outpatient Medications on File Prior to Visit  Medication Sig Dispense Refill  . aspirin 81 MG tablet Take 81 mg by mouth daily.    [name] EPINEPHrine 0.3 mg/0.3 mL IJ SOAJ injection Inject once into muscle for any allergic reaction to peanut exposure 1 Device 1  . ezetimibe (ZETIA) 10 MG tablet TAKE ONE TABLET BY MOUTH ONE TIME DAILY 30 tablet 5  . gabapentin (NEURONTIN) 300 MG capsule Take 3 tablets nightly 90 capsule 8  . Lancets (ONETOUCH ULTRASOFT) lancets Check blood sugar daily as directed. 100 each 12  . metFORMIN (GLUCOPHAGE-XR) 500 MG 24 hr tablet Take 500 mg by mouth 2 (two) times daily.     [name] omeprazole (PRILOSEC) 20 MG capsule TAKE ONE CAPSULE BY MOUTH ONE TIME DAILY 30 capsule 3  . omeprazole (PRILOSEC) 20 MG capsule TAKE ONE CAPSULE BY MOUTH ONE TIME DAILY 30 capsule 5  . ondansetron (ZOFRAN ODT) 4 MG disintegrating tablet Take 1 tablet (4 mg total) by mouth every 8 (eight) hours as needed for nausea or vomiting. 18 tablet 1  . ONETOUCH VERIO test strip TEST ONCE DAILY AS DIRECTED 100 each 11  . promethazine (PHENERGAN) 25 MG tablet Take 1 tablet (25 mg total) by mouth every 8 (eight) hours as needed for nausea or vomiting. 10 tablet 0   No current facility-administered medications on file prior to visit.     BP 138/81   Pulse 89   Temp 97.6 F (36.4 C) (Oral)   Resp 16   Ht 5\' 4"  (1.626 m)   Wt 165 lb 12.8 oz (75.2 kg)   SpO2 97%   BMI 28.46 kg/m       Objective:   Physical Exam  General Mental Status- Alert. General Appearance- Not in acute distress.   Skin General: Color- Normal Color. Moisture- Normal Moisture.  Neck Carotid Arteries- Normal color. Moisture- Normal Moisture. No carotid  bruits. No JVD.  Chest and Lung Exam Auscultation: Breath Sounds:-Normal.  Cardiovascular Auscultation:Rythm- Regular. Murmurs & Other Heart Sounds:Auscultation of the heart reveals- No Murmurs.  Abdomen Inspection:-Inspeection Normal. Palpation/Percussion:Note:No mass. Palpation and Percussion of the abdomen reveal- Non Tender, Non Distended + BS, no rebound or  guarding.    Neurologic Cranial Nerve exam:- CN III-XII intact(No nystagmus), symmetric smile. Strength:- 5/5 equal and symmetric strength both upper and lower extremities.      Assessment & Plan:  You do appear to have otitis externa with possible infection of membrane/ear drum. Start with cortipsrin otic drops first and if ear pain worsens then add on azithromycin.   For diabetes and high cholesterol, I am placing future labs to get done tomorrow am.   Will update you on lab results.  Follow up in 7 days or as needed  Discussed her allergy history today. Will follow her in future if recurrent insect bites with severe reaction. But she does have epipen that she can use if needed  [employer]

## 2018-03-08 NOTE — Patient Instructions (Addendum)
You do appear to have otitis externa with possible infection of membrane/ear drum. Start with cortispsrin otic drops first and if ear pain worsens then add on azithromycin.   For diabetes and high cholesterol, I am placing future labs to get done tomorrow am.   Will update you on lab results.  Follow up in 7 days or as needed  Discussed her allergy history today. Will follow her in future if recurrent insect bites with severe reaction. But she does have epipen that she can use if needed

## 2018-03-09 ENCOUNTER — Telehealth: Payer: Self-pay | Admitting: Medical

## 2018-03-09 ENCOUNTER — Other Ambulatory Visit (INDEPENDENT_AMBULATORY_CARE_PROVIDER_SITE_OTHER): Payer: 59

## 2018-03-09 DIAGNOSIS — E1169 Type 2 diabetes mellitus with other specified complication: Secondary | ICD-10-CM | POA: Diagnosis not present

## 2018-03-09 DIAGNOSIS — E785 Hyperlipidemia, unspecified: Secondary | ICD-10-CM | POA: Diagnosis not present

## 2018-03-09 DIAGNOSIS — E119 Type 2 diabetes mellitus without complications: Secondary | ICD-10-CM

## 2018-03-09 LAB — LIPID PANEL
Cholesterol: 197 mg/dL (ref 0–200)
HDL: 47.5 mg/dL (ref >39.00)
NonHDL: 149.17
Total CHOL/HDL Ratio: 4
Triglycerides: 261 mg/dL — ABNORMAL HIGH (ref 0.0–149.0)
VLDL: 52.2 mg/dL — ABNORMAL HIGH (ref 0.0–40.0)

## 2018-03-09 LAB — LDL CHOLESTEROL, DIRECT: LDL DIRECT: 115 mg/dL

## 2018-03-09 LAB — COMPREHENSIVE METABOLIC PANEL
ALBUMIN: 4.3 g/dL (ref 3.5–5.2)
ALK PHOS: 68 U/L (ref 39–117)
ALT: 16 U/L (ref 0–35)
AST: 20 U/L (ref 0–37)
BUN: 11 mg/dL (ref 6–23)
CO2: 31 mEq/L (ref 19–32)
CREATININE: 0.92 mg/dL (ref 0.40–1.20)
Calcium: 10.3 mg/dL (ref 8.4–10.5)
Chloride: 104 mEq/L (ref 96–112)
GFR: 69.63 mL/min (ref >60.00)
Glucose, Bld: 125 mg/dL — ABNORMAL HIGH (ref 70–99)
Potassium: 4.8 mEq/L (ref 3.5–5.1)
SODIUM: 143 meq/L (ref 135–145)
TOTAL PROTEIN: 7.1 g/dL (ref 6.0–8.3)
Total Bilirubin: 0.5 mg/dL (ref 0.2–1.2)

## 2018-03-09 LAB — HEMOGLOBIN A1C: HEMOGLOBIN A1C: 6.6 % — AB (ref 4.6–6.5)

## 2018-03-09 MED ORDER — SAXAGLIPTIN HCL 2.5 MG PO TABS: 2.5000 mg | ORAL_TABLET | Freq: Every day | ORAL | 3 refills | Status: DC

## 2018-03-09 MED ORDER — METFORMIN HCL ER 500 MG PO TB24: ORAL_TABLET | ORAL | 3 refills | Status: DC

## 2018-03-09 NOTE — Telephone Encounter (Signed)
Refilled metfomrin and new rx of onglyza.

## 2018-03-10 ENCOUNTER — Encounter: Payer: Self-pay | Admitting: Medical

## 2018-03-15 ENCOUNTER — Other Ambulatory Visit: Payer: Self-pay | Admitting: Medical

## 2018-03-19 ENCOUNTER — Telehealth: Payer: Self-pay | Admitting: Medical

## 2018-03-19 ENCOUNTER — Encounter: Payer: Self-pay | Admitting: Medical

## 2018-03-19 MED ORDER — LINAGLIPTIN 5 MG PO TABS: 5.0000 mg | ORAL_TABLET | Freq: Every day | ORAL | 3 refills | Status: DC

## 2018-03-19 NOTE — Telephone Encounter (Signed)
rx tradjenta sent to pt phramacy. Will dc onglyza next month and start tradjenta next month.

## 2018-04-08 ENCOUNTER — Telehealth: Payer: Self-pay | Admitting: Medical

## 2018-04-08 ENCOUNTER — Other Ambulatory Visit: Payer: Self-pay

## 2018-04-08 MED ORDER — METFORMIN HCL ER 500 MG PO TB24: ORAL_TABLET | ORAL | 3 refills | Status: DC

## 2018-04-08 NOTE — Telephone Encounter (Signed)
Did not fill onglyza. Chart indicates she is on tradjenta. Will you clarify if she is on tradjenta? Both are in same class of medication.

## 2018-04-08 NOTE — Telephone Encounter (Signed)
Patient requesting metformin and onglyza. Onglyza not on patient's current medication list. Please advise.

## 2018-04-09 ENCOUNTER — Encounter: Payer: Self-pay | Admitting: Medical

## 2018-04-09 NOTE — Telephone Encounter (Signed)
I spoke to the patient and she has only had 30 days of tradjenta---which was free, but to get refills will cost. She is going to communicated with Percell Miller through a mychart message to ask if she even needs to be on it as her sugars have been really good.  She will be sending you a Pharmacist, community message.

## 2018-04-09 NOTE — Telephone Encounter (Signed)
Sent message to patient on my chart discussing her questions.

## 2018-04-13 ENCOUNTER — Telehealth: Payer: Self-pay | Admitting: *Deleted

## 2018-04-13 DIAGNOSIS — Z1231 Encounter for screening mammogram for malignant neoplasm of breast: Secondary | ICD-10-CM

## 2018-04-13 NOTE — Telephone Encounter (Signed)
Left message to call Sharee Pimple at [phone].   Last Screening MMG 09/15/17 at [facility] MRI 04/13/17 at [facility]  Patient due for Bilateral breast MRI, what location does she want order to for imaging?

## 2018-04-13 NOTE — Telephone Encounter (Signed)
Spoke with patient. Advised as seen below per Dr. [name] Heck. Patient request [facility] or [facility] for MRI. Advised order placed for precert. [facility] will call patient directly to schedule. Patient states [employer] is now her primary insurance plan. Patient verbalizes understanding and is agreeable.    Routing to [employer] for [employer].   Encounter closed.

## 2018-04-13 NOTE — Telephone Encounter (Signed)
-----   Message from Megan Salon, MD sent at 04/07/2018 10:54 AM EDT ----- Regarding: breast MRI This pt needs breast MRI scheduled due to high risk of developing breast cancer.  Last breast MRI was 04/13/18.  Thanks.

## 2018-04-15 ENCOUNTER — Encounter: Payer: Self-pay | Admitting: Medical

## 2018-04-15 MED ORDER — EPINEPHRINE 0.3 MG/0.3ML IJ SOAJ: INTRAMUSCULAR | 1 refills | Status: DC

## 2018-04-30 ENCOUNTER — Ambulatory Visit (HOSPITAL_COMMUNITY)
Admission: RE | Admit: 2018-04-30 | Discharge: 2018-04-30 | Disposition: A | Discharge status: Home / Self Care | Payer: 59 | Source: Ambulatory Visit | Attending: Obstetrics & Gynecology | Admitting: Obstetrics & Gynecology

## 2018-04-30 ENCOUNTER — Encounter: Payer: Self-pay | Admitting: Obstetrics & Gynecology

## 2018-04-30 ENCOUNTER — Telehealth: Payer: Self-pay | Admitting: Obstetrics & Gynecology

## 2018-04-30 ENCOUNTER — Other Ambulatory Visit: Payer: Self-pay

## 2018-04-30 ENCOUNTER — Ambulatory Visit: Payer: 59 | Admitting: Obstetrics & Gynecology

## 2018-04-30 VITALS — BP 102/68 | HR 92 | Resp 16 | Ht 64.0 in | Wt 159.8 lb

## 2018-04-30 DIAGNOSIS — Z1231 Encounter for screening mammogram for malignant neoplasm of breast: Secondary | ICD-10-CM | POA: Diagnosis present

## 2018-04-30 DIAGNOSIS — N926 Irregular menstruation, unspecified: Secondary | ICD-10-CM | POA: Diagnosis not present

## 2018-04-30 DIAGNOSIS — R928 Other abnormal and inconclusive findings on diagnostic imaging of breast: Secondary | ICD-10-CM | POA: Insufficient documentation

## 2018-04-30 MED ORDER — GADOBUTROL 1 MMOL/ML IV SOLN
7.0000 mL | Freq: Once | INTRAVENOUS | Status: AC | PRN
  Administered 2018-04-30: 7.5 mL via INTRAVENOUS

## 2018-04-30 NOTE — Telephone Encounter (Signed)
Reviewed with Dr. [name] Heck call returned to patient. OV scheduled for today at 1:15pm. Patient verbalizes understanding and is agreeable. Encounter closed.

## 2018-04-30 NOTE — Telephone Encounter (Signed)
Patient sent the following correspondence through [place]. Routing to triage to assist patient with request.  Good Morning,   I woke up this morning and am bleeding again. It isn't a lot. just enough to be annoying. Any thoughts?   Thanks,  Carolyn Wilson   Last seen: 10/23/17

## 2018-04-30 NOTE — Progress Notes (Signed)
GYNECOLOGY  VISIT  CC:   Vaginal bleeding  HPI: 47 y.o. [id number] Married White or Caucasian female here for post menopausal bleeding.  Pt has been experiencing intermittent issues with fluctuating FSH.  This has resulted in periods of time when she feels like symptoms are more consistent with menopause.  Then, at other time, she will have unexpected bleeding.  She is finding this very frustrating and worrisome.  She's started having bleeding again this week.  The bleeding was even heavy after it first started.  Bleeding is better now.  She does not have any cramping.  Pt did have an endometrial ablation in 2009.  I have attempted an endometrial biopsy on two previous occasions.  There has not been endometrial tissue in these biopsies.  Endocervical cells have been normal.  In both cases with these biopsies, I did not feel the endometrial pipelle every fully was in the endometrial cavity.  Discussed all of this with the pt today.  She is considering hysterectomy as she is very tired of the intermittent and unpredictable bleeding.    Last pap smear was 1/19 and was negative with negative HR HPV.  GYNECOLOGIC HISTORY: Patient's last menstrual period was 04/27/2017. Contraception: tubal ligation Menopausal hormone therapy: none  Patient Active Problem List   Diagnosis Date Noted  . Tachycardia 08/05/2017  . Right shoulder pain 12/18/2016  . Wellness examination 11/21/2015  . Diabetes ([place]) 09/04/2015  . Allergy with anaphylaxis due to peanuts 08/13/2014  . Abnormal Pap smear of cervix  remote Leep  1996 per pt 08/13/2014  . Allergic rhinitis 08/13/2014  . S/P endometrial ablation 08/13/2014  . GERD (gastroesophageal reflux disease) 08/13/2014  . Diarrhea post cholecystectomy since 1999 08/13/2014  . FHx: colon cancer  father  08/13/2014  . 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 Headache, migraine 08/02/2012  . Family history of breast cancer 01/06/2012  . Family  history of malignant neoplasm of ovary 01/06/2012    Past Medical History:  Diagnosis Date  . Abnormal Pap smear of cervix    h/o colposcopy/biopsy  . Allergy   . Asthma    HX  . Breast cancer ([place]) 2007  . Cancer ([place]) [id number]   ovarian  . Diabetes mellitus without complication ([place]) 73/7106    diagnosed by PCP, [name]   . GERD (gastroesophageal reflux disease)   . Hyperlipemia    on medication     Past Surgical History:  Procedure Laterality Date  . APPENDECTOMY     age [age]  . BREAST LUMPECTOMY Left 2008   left breast  . CHOLECYSTECTOMY  2000  . ENDOMETRIAL ABLATION  2009  . LAPAROSCOPIC SALPINGO OOPHERECTOMY Left 1996   with right tubal ligation  . TUBAL LIGATION      MEDS:   Current Outpatient Medications on File Prior to Visit  Medication Sig Dispense Refill  . aspirin 81 MG tablet Take 81 mg by mouth daily.    [name] ezetimibe (ZETIA) 10 MG tablet TAKE ONE TABLET BY MOUTH ONE TIME DAILY 30 tablet 3  . gabapentin (NEURONTIN) 300 MG capsule Take 3 tablets nightly 90 capsule 8  . Lancets (ONETOUCH ULTRASOFT) lancets Check blood sugar daily as directed. 100 each 12  . metFORMIN (GLUCOPHAGE-XR) 500 MG 24 hr tablet 2 tab po bid 120 tablet 3  . Multiple Vitamin (MULTIVITAMIN) tablet Take 1 tablet by mouth daily.    [name] omeprazole (PRILOSEC) 20 MG capsule TAKE ONE CAPSULE BY MOUTH ONE TIME DAILY 30 capsule  3  . ondansetron (ZOFRAN ODT) 4 MG disintegrating tablet Take 1 tablet (4 mg total) by mouth every 8 (eight) hours as needed for nausea or vomiting. 18 tablet 1  . ONETOUCH VERIO test strip TEST ONCE DAILY AS DIRECTED 100 each 11  . promethazine (PHENERGAN) 25 MG tablet Take 1 tablet (25 mg total) by mouth every 8 (eight) hours as needed for nausea or vomiting. 10 tablet 0  . EPINEPHrine 0.3 mg/0.3 mL IJ SOAJ injection Inject once into muscle for any allergic reaction to peanut exposure (Patient not taking: Reported on 04/30/2018) 2 Device 1   No current  facility-administered medications on file prior to visit.     ALLERGIES: Latex; Peanut-containing drug products; Sulfa antibiotics; Adhesive [tape]; Canagliflozin; Reglan [metoclopramide]; Ciprofloxacin; and Sulfur  Family History  Problem Relation Age of Onset  . Diabetes Mother   . Colon cancer Father   . Breast cancer Maternal Aunt   . Ovarian cancer Maternal Aunt   . Ovarian cancer Cousin   . Colon cancer Cousin   . Melanoma Cousin   . Breast cancer Maternal Grandmother   . Cancer Other        breast and ovarian-maternal side    SH:  Married, non smoker  Review of Systems  Genitourinary: Positive for vaginal bleeding.  All other systems reviewed and are negative.   PHYSICAL EXAMINATION:    BP 102/68 (BP Location: Right Arm, Patient Position: Sitting, Cuff Size: Large)   Pulse 92   Resp 16   Ht 5\' 4"  (1.626 m)   Wt 159 lb 12.8 oz (72.5 kg)   LMP 04/27/2017 Comment: spotting  BMI 27.43 kg/m     General appearance: alert, cooperative and appears stated age Abdomen: soft, non-tender; bowel sounds normal; no masses,  no organomegaly Lymph:  no inguinal LAD noted  Pelvic: External genitalia:  no lesions              Urethra:  normal appearing urethra with no masses, tenderness or lesions              Bartholins and Skenes: normal                 Vagina: normal appearing vagina with normal color and discharge, no lesions              Cervix: no lesions              Bimanual Exam:  Uterus:  normal size, contour, position, consistency, mobility, non-tender              Adnexa: no mass, fullness, tenderness  Chaperone was present for exam.  Assessment: Vaginal bleeding, possible due to fluctuating [facility]  Plan: Will obtain [facility] and pt will return for PUS.  If [place] is elevated, I would recommend she consider hysterectomy as I have been unable to successfully obtain endometrial sampling.  If this is low, she admits she is still contemplating the same decision as she is tired  of the irregular bleeding and tired of being worried.  We will discuss further when she returns for her PUS.

## 2018-04-30 NOTE — Telephone Encounter (Signed)
Spoke with patient. Patient reports spotting old blood with clots today. Bleeding is not heavy, but has not resolved. Denies pain or urinary symptoms. Hx of tubal ligation and ablation. Last [facility] 10/23/17 was 73.8. Taking OTC estroven daily and 300 mg gabapentin nightly. Last AEX 07/2017. Advised will review with Dr. [name] Heck and return call, patient agreeable.

## 2018-05-01 LAB — FOLLICLE STIMULATING HORMONE: FSH: 12.6 m[IU]/mL

## 2018-05-05 ENCOUNTER — Telehealth: Payer: Self-pay | Admitting: Emergency Medicine

## 2018-05-05 NOTE — Telephone Encounter (Signed)
Notes recorded by Burnice Logan, RN on 05/05/2018 at 2:15 PM EDT Left message to call Sharee Pimple, RN at [place].

## 2018-05-05 NOTE — Telephone Encounter (Addendum)
Spoke with patient, advised as seen below per Dr. [name] Heck. Patient request to discuss further with Dr. [name] Heck at [facility] tomorrow, 05/06/18, before proceeding with scheduling. No orders placed at this time. Patient verbalizes understanding and is agreeable.   Routing to Dr. Lestine Box.  Encounter closed.

## 2018-05-05 NOTE — Telephone Encounter (Signed)
-----   Message from Megan Salon, MD sent at 05/05/2018 12:04 PM EDT ----- Can you please call pt and let her know that the radiologist is recommending two breast biopsies on the right breast.  Please proceed with scheduling.

## 2018-05-06 ENCOUNTER — Ambulatory Visit (INDEPENDENT_AMBULATORY_CARE_PROVIDER_SITE_OTHER): Payer: 59

## 2018-05-06 ENCOUNTER — Ambulatory Visit: Payer: 59 | Admitting: Obstetrics & Gynecology

## 2018-05-06 ENCOUNTER — Telehealth: Payer: Self-pay | Admitting: *Deleted

## 2018-05-06 VITALS — BP 132/86 | HR 88 | Ht 64.0 in | Wt 159.8 lb

## 2018-05-06 DIAGNOSIS — N6310 Unspecified lump in the right breast, unspecified quadrant: Secondary | ICD-10-CM

## 2018-05-06 DIAGNOSIS — N926 Irregular menstruation, unspecified: Secondary | ICD-10-CM

## 2018-05-06 DIAGNOSIS — R928 Other abnormal and inconclusive findings on diagnostic imaging of breast: Secondary | ICD-10-CM | POA: Diagnosis not present

## 2018-05-06 NOTE — Telephone Encounter (Signed)
Left message for Carolyn Wilson to call Sharee Pimple, RN at [facility].   Options for locations for MRI guided breast biopsies in [place] or with [facility].   Patient needs 2 right breast biopsies. See MRI results dated 04/30/18.

## 2018-05-06 NOTE — Progress Notes (Signed)
Spoke with patient while in office regarding scheduling of MRI guided biopsies. Patient request to schedule in [facility], any facility. Advised patient I will f/u with imaging centers in [facility] for available options and return call to advise, will then proceed with scheduling. Patient is aware she will need to request images MRI preformed at [facility] to imaging facility.  Patient verbalizes understanding and is agreeable. See telephone encounter dated 05/06/18.

## 2018-05-06 NOTE — Telephone Encounter (Signed)
Spoke with patient while in office regarding scheduling of MRI guided biopsies. Patient request to schedule in [facility], any facility. Advised patient I will f/u with imaging centers in [facility] for available options and return call to advise, will then proceed with scheduling. Patient is aware she will need to request images MRI preformed at [facility] to imaging facility.  Patient verbalizes understanding and is agreeable. See telephone encounter dated 05/06/18.

## 2018-05-06 NOTE — Progress Notes (Signed)
47 y.o. G3P0101 Married White or Caucasian female here for pelvic ultrasound due to perimenopausal bleeding.  H/O endometrial ablation in 2009.  Did not typically have bleeding after the ablation.  She's had bleeding with fluctuating FSH values three times over the past two years.  She will have episodes of menopausal symptoms, specifically with hot flashes, that will improve.  [place] has been between 12 and 73 over the past two years.  It has fluctuated up and down.  Two attempts at an endometrial biopsy have been attempted but I have been unable to obtained endometrial tissue likely due to the prior ablation.    Pap was negative with negative HR HPV 07/27/17.  Pt did just have breast MRI with at least two locations that need to be biopsied.  Pt does not want to do this at the breast center and desires to have this done elsewhere.  Also, wants to review report with me today.  We did review this line by line and all questions were answered.  Patient's last menstrual period was 04/27/2017.  Contraception: tubal ligation  Findings:  UTERUS: 6.7 x 4.6 x 2.9cm EMS: 2.62mm ADNEXA: Left ovary:  2.5 x 1.0 x 1.2cm       Right ovary: surgically absent CUL DE SAC: no free fluid noted  Discussion:  Findings reviewed with pt.  Given low FSH and thin endometrium, feel this is likely perimenopausal bleeding.  However, I have been unable to obtain an endometrial biopsy (likely due to the scarring from the ablation) so I cannot be 100% sure about the source of bleeding and benign nature of it.  Given her age and that she is not obese, I do feel it is ok to watch.  However, did discuss with pt if this makes her feel uncomfortable, then I would proceed with hysterectomy at this time.  She is also comfortable watching this but if it happens again, I think she will decide otherwise.  Do not feel attempting a biopsy again is going to give any additional information at this time.    Will proceed with trying to schedule  breast biopsy at location other than the breast center.  Will discuss with triage RN before leaving today.    Assessment:  Perimenopausal bleeding Abnormal screening breast MRI in pt with increased risks of breast cancer  Plan:  Pt is going to monitor for additional bleeding MRI guided breast biopsy will be scheduled  ~25 minutes spent with patient >50% of time was in face to face discussion of above.

## 2018-05-07 NOTE — Telephone Encounter (Signed)
Spoke with Ivin Booty, Breast Health Coordinator at [facility]. Was advised no locations in [facility] for MRI guided breast biopsies, they referral to [place] or [facility].   [place] radiology scheduling  [phone], option 2

## 2018-05-07 NOTE — Telephone Encounter (Addendum)
Spoke with patient, advised of imaging options. Patient request to return to [place], request to schedule with female radiologist. Advised I can return call to [place] and return call.   Spoke with Volanda Napoleon at [facility]. Was advised can schedule with female radiologist, first available appt 05/17/18. No appt scheduled, [facility] will call patient directly to schedule once order placed.  Advised will return call to patient to advise.   Call returned to patient. Advised as seen above. Patient requesting to know when first available appt at [facility] is? Advised I can check into this. Advised patient she will have to request images be sent, this may cause delay in scheudling. Patient verbalizes understanding.   Call placed to [facility] scheduling at [phone], spoke with [facility]. Was advised once order received, they will contact patient to schedule.

## 2018-05-07 NOTE — Telephone Encounter (Signed)
Call returned to patient, again reveiwed options of scheduling. Patient request to proceed with scheduling at [place] with female radiologist. Advised I will place orders, [facility] will call you directly to schedule. Patient verbalizes understanding and is agreeable.  Orders placed.   Routing to [employer] closed.

## 2018-05-09 ENCOUNTER — Encounter: Payer: Self-pay | Admitting: Obstetrics & Gynecology

## 2018-05-17 ENCOUNTER — Ambulatory Visit
Admission: RE | Admit: 2018-05-17 | Discharge: 2018-05-17 | Disposition: A | Discharge status: Home / Self Care | Payer: 59 | Source: Ambulatory Visit | Attending: Obstetrics & Gynecology | Admitting: Obstetrics & Gynecology

## 2018-05-17 ENCOUNTER — Ambulatory Visit: Payer: 59

## 2018-05-17 DIAGNOSIS — N6310 Unspecified lump in the right breast, unspecified quadrant: Secondary | ICD-10-CM

## 2018-05-17 DIAGNOSIS — Z803 Family history of malignant neoplasm of breast: Secondary | ICD-10-CM

## 2018-05-17 MED ORDER — ONDANSETRON HCL 4 MG/2ML IJ SOLN: 8.0000 mg | Freq: Once | INTRAMUSCULAR | Status: DC

## 2018-05-17 MED ORDER — GADOBENATE DIMEGLUMINE 529 MG/ML IV SOLN: 14.0000 mL | Freq: Once | INTRAVENOUS | Status: DC | PRN

## 2018-05-17 MED ORDER — PROMETHAZINE HCL 25 MG/ML IJ SOLN
6.2500 mg | Freq: Four times a day (QID) | INTRAMUSCULAR | Status: DC | PRN
  Administered 2018-05-17 (×2): 6.25 mg via INTRAVENOUS

## 2018-05-17 MED ORDER — ONDANSETRON 8 MG PO TBDP
8.0000 mg | ORAL_TABLET | Freq: Once | ORAL | Status: AC
  Administered 2018-05-17: 8 mg via ORAL

## 2018-05-20 ENCOUNTER — Encounter: Payer: Self-pay | Admitting: Medical

## 2018-05-20 MED ORDER — OMEPRAZOLE 20 MG PO CPDR: 20.0000 mg | DELAYED_RELEASE_CAPSULE | Freq: Every day | ORAL | 3 refills | Status: DC

## 2018-05-31 ENCOUNTER — Ambulatory Visit
Admission: RE | Admit: 2018-05-31 | Discharge: 2018-05-31 | Disposition: A | Discharge status: Home / Self Care | Payer: 59 | Source: Ambulatory Visit | Attending: Obstetrics & Gynecology | Admitting: Obstetrics & Gynecology

## 2018-05-31 DIAGNOSIS — N6310 Unspecified lump in the right breast, unspecified quadrant: Secondary | ICD-10-CM

## 2018-05-31 MED ORDER — GADOBUTROL 1 MMOL/ML IV SOLN
7.0000 mL | Freq: Once | INTRAVENOUS | Status: AC | PRN
  Administered 2018-05-31: 7 mL via INTRAVENOUS

## 2018-06-15 ENCOUNTER — Encounter: Payer: Self-pay | Admitting: Medical

## 2018-06-15 ENCOUNTER — Telehealth: Payer: Self-pay | Admitting: Medical

## 2018-06-15 MED ORDER — AZITHROMYCIN 250 MG PO TABS: ORAL_TABLET | ORAL | 0 refills | Status: DC

## 2018-06-15 NOTE — Telephone Encounter (Signed)
RX zpack sent to pt pharmacy.

## 2018-06-16 ENCOUNTER — Telehealth: Payer: Self-pay

## 2018-06-16 DIAGNOSIS — E1169 Type 2 diabetes mellitus with other specified complication: Secondary | ICD-10-CM

## 2018-06-16 DIAGNOSIS — E785 Hyperlipidemia, unspecified: Principal | ICD-10-CM

## 2018-06-16 MED ORDER — EZETIMIBE 10 MG PO TABS: 10.0000 mg | ORAL_TABLET | Freq: Every day | ORAL | 1 refills | Status: DC

## 2018-06-16 NOTE — Telephone Encounter (Signed)
Received [facility] day supply [facility] for zetia. Refill processed per protocol.

## 2018-06-25 ENCOUNTER — Other Ambulatory Visit: Payer: Self-pay

## 2018-06-25 MED ORDER — METFORMIN HCL ER 500 MG PO TB24: ORAL_TABLET | ORAL | 3 refills | Status: DC

## 2018-08-02 ENCOUNTER — Encounter: Payer: Self-pay | Admitting: Obstetrics & Gynecology

## 2018-08-03 ENCOUNTER — Telehealth: Payer: Self-pay | Admitting: *Deleted

## 2018-08-03 NOTE — Telephone Encounter (Signed)
Good Afternoon,  My hot flashes have come back I'm having about 3 an hour, any suggestions on how to make them less frequent?  I am taking the meds before bed but not getting much sleep.  Thanks,  Carolyn Wilson

## 2018-08-03 NOTE — Telephone Encounter (Signed)
Routing to Dr. [name] Heck to review and advise regarding hot flashes.  Patient taking Gabapentin 300 mg, 3 tabs QHS.

## 2018-08-04 ENCOUNTER — Other Ambulatory Visit: Payer: Self-pay | Admitting: Obstetrics & Gynecology

## 2018-08-04 MED ORDER — PAROXETINE HCL 10 MG PO TABS: 10.0000 mg | ORAL_TABLET | Freq: Every day | ORAL | 0 refills | Status: DC

## 2018-08-04 NOTE — Telephone Encounter (Signed)
Ok to schedule OV if has lots of questions.

## 2018-08-04 NOTE — Telephone Encounter (Signed)
Call to patient. No answer on mobile, unable to leave message.   Mychart message sent to patient.

## 2018-08-04 NOTE — Telephone Encounter (Signed)
Message   I would like to try that. They had gone away but seem to be more frequent now so I would do anything that helps.

## 2018-08-04 NOTE — Telephone Encounter (Signed)
She could add 10mg  Paxil daily if she wanted.  This can be taken with the gabapentin.  Ok to send in rx for #30/0RF if she desires.  Will need update in 2-3 weeks from her.

## 2018-08-11 ENCOUNTER — Ambulatory Visit (INDEPENDENT_AMBULATORY_CARE_PROVIDER_SITE_OTHER): Payer: BLUE CROSS/BLUE SHIELD | Admitting: Medical

## 2018-08-11 ENCOUNTER — Encounter: Payer: Self-pay | Admitting: Medical

## 2018-08-11 VITALS — BP 98/70 | HR 122 | Temp 98.8°F | Resp 16 | Ht 64.0 in | Wt 163.4 lb

## 2018-08-11 DIAGNOSIS — R05 Cough: Secondary | ICD-10-CM | POA: Diagnosis not present

## 2018-08-11 DIAGNOSIS — M791 Myalgia, unspecified site: Secondary | ICD-10-CM | POA: Diagnosis not present

## 2018-08-11 DIAGNOSIS — J111 Influenza due to unidentified influenza virus with other respiratory manifestations: Secondary | ICD-10-CM

## 2018-08-11 DIAGNOSIS — R112 Nausea with vomiting, unspecified: Secondary | ICD-10-CM

## 2018-08-11 DIAGNOSIS — R059 Cough, unspecified: Secondary | ICD-10-CM

## 2018-08-11 DIAGNOSIS — R197 Diarrhea, unspecified: Secondary | ICD-10-CM | POA: Diagnosis not present

## 2018-08-11 MED ORDER — BENZONATATE 100 MG PO CAPS: 100.0000 mg | ORAL_CAPSULE | Freq: Three times a day (TID) | ORAL | 0 refills | Status: DC | PRN

## 2018-08-11 MED ORDER — ONDANSETRON 8 MG PO TBDP: 8.0000 mg | ORAL_TABLET | Freq: Three times a day (TID) | ORAL | 0 refills | Status: DC | PRN

## 2018-08-11 MED ORDER — OSELTAMIVIR PHOSPHATE 75 MG PO CAPS: 75.0000 mg | ORAL_CAPSULE | Freq: Two times a day (BID) | ORAL | 0 refills | Status: DC

## 2018-08-11 NOTE — Patient Instructions (Signed)
You do have recent episodes of possible viral syndrome.  Your severe myalgias along with other symptoms makes me consider possible flu diagnosis.  The rapid test came back negative but I do think it is best for you to go ahead and start Tamiflu in the event of false negative result.  I will prescribe Tamiflu tablets. Recommend rest, hydration, bland foods, ibuprofen for diarrhea and Tylenol(or ibuprofen for body aches.)  If diarrhea is worsening rather than improving by Friday then go ahead and turn in gastro panel.  You can pick that kit up-to-date.  You mention also today that he had a slight red rash over the inner aspect of your wrist.  Minimal stinging and burning.  Will prescribe mupirocin topical antibiotic.  If this rash changes or worsens please let me know.  Either call or MyChart.  If any expansion of rash towards to be your finger or up your arm then recommend ED evaluation.  Follow-up in 7 to 10 days or as needed.

## 2018-08-11 NOTE — Progress Notes (Signed)
Subjective:    Patient ID: Carolyn Wilson, female    DOB: [birth year], 48 y.o.   MRN: [id number]  HPI Pt in today states yesterday around 5 pm she started to feel sick.  She got body aches, chills, fever, fatigue, rare dry cough, upset stomach  and diarrhea.  Diarrhea about 6-7 times today. No recent antibiotics. Pt has no one close to her with similar signs or symptoms.   Pt vomited about 6-7 times today.  Pt did get flu vaccine this year.   Review of Systems  Constitutional: Positive for chills, fatigue and fever.  Respiratory: Positive for cough. Negative for chest tightness, shortness of breath and wheezing.        Rare cough.  Cardiovascular: Negative for chest pain and palpitations.  Gastrointestinal: Positive for abdominal pain, diarrhea, nausea and vomiting. Negative for constipation and rectal pain.  Musculoskeletal: Positive for myalgias.  Skin: Negative for rash.  Neurological: Positive for headaches. Negative for dizziness, syncope, weakness and numbness.  Hematological: Negative for adenopathy. Does not bruise/bleed easily.  Psychiatric/Behavioral: Negative for behavioral problems and confusion.       Objective:   Physical Exam  General  Mental Status - Alert. General Appearance - Well groomed. Not in acute distress.  Skin Rashes- No Rashes.  HEENT Head- Normal. Ear Auditory Canal - Left- Normal. Right - Normal.Tympanic Membrane- Left- Normal. Right- Normal. Eye Sclera/Conjunctiva- Left- Normal. Right- Normal. Nose & Sinuses Nasal Mucosa- Left-  Boggy and Congested. Right-  Boggy and  Congested.Bilateral  No maxillary and  n ofrontal sinus pressure. Mouth & Throat Lips: Upper Lip- Normal: no dryness, cracking, pallor, cyanosis, or vesicular eruption. Lower Lip-Normal: no dryness, cracking, pallor, cyanosis or vesicular eruption. Buccal Mucosa- Bilateral- No Aphthous ulcers. Oropharynx- No Discharge or Erythema. Tonsils: Characteristics- Bilateral-  No Erythema or Congestion. Size/Enlargement- Bilateral- No enlargement. Discharge- bilateral-None.  Neck Neck- Supple. No Masses.   Chest and Lung Exam Auscultation: Breath Sounds:-Clear even and unlabored.  Cardiovascular Auscultation:Rythm- Regular, rate and rhythm. Murmurs & Other Heart Sounds:Ausculatation of the heart reveal- No Murmurs.  Lymphatic Head & Neck General Head & Neck Lymphatics: Bilateral: Description- No Localized lymphadenopathy.  Abdomen- soft, nt, nd, +bs, no rebound or guarding.  Back- no cva tenderness.       Assessment & Plan:  You do have recent episodes of possible viral syndrome.  Your severe myalgias along with other symptoms makes me consider possible flu diagnosis.  The rapid test came back negative but I do think it is best for you to go ahead and start Tamiflu in the event of false negative result.  I will prescribe Tamiflu tablets. Recommend rest, hydration, bland foods, ibuprofen for diarrhea and Tylenol(or ibuprofen for body aches.)  If diarrhea is worsening rather than improving by Friday then go ahead and turn in gastro panel.  You can pick that kit up-to-date.  You mention also today that he had a slight red rash over the inner aspect of your wrist.  Minimal stinging and burning.  Will prescribe mupirocin topical antibiotic.  If this rash changes or worsens please let me know.  Either call or MyChart.  If any expansion of rash towards to be your finger or up your arm then recommend ED evaluation.  Follow-up in 7 to 10 days or as needed.  Also recheck your blood pressure and it was low.  Not high.  Her pulse was tachycardic as well.  I explained to her that she likely begin to get dehydrated.  So recommended push fluids with either propel or sugar-free Gatorade.  Mackie Pai, PA-C

## 2018-08-12 ENCOUNTER — Telehealth: Payer: Self-pay | Admitting: Medical

## 2018-08-12 ENCOUNTER — Encounter: Payer: Self-pay | Admitting: Medical

## 2018-08-12 MED ORDER — MUPIROCIN CALCIUM 2 % EX CREA: 1.0000 "application " | TOPICAL_CREAM | Freq: Two times a day (BID) | CUTANEOUS | 0 refills | Status: DC

## 2018-08-12 MED ORDER — MUPIROCIN 2 % EX OINT: TOPICAL_OINTMENT | CUTANEOUS | 0 refills | Status: DC

## 2018-08-12 NOTE — Telephone Encounter (Signed)
Rx antibiotic sent to pt pharmacy.

## 2018-08-12 NOTE — Telephone Encounter (Signed)
Ointment rx sent to pt pharmacy.

## 2018-08-12 NOTE — Telephone Encounter (Signed)
Copied from [place] [id number]. Topic: Quick Communication - Rx Refill/Question >> Aug 12, 2018 11:20 AM Virl Axe D wrote: Medication: mupirocin cream (BACTROBAN) 2 % / Pharmacy called and stated that cream form is $161 and ointment is $20. Requesting to change rx to ointment. Please advise.   Has the patient contacted their pharmacy? Yes.   (Agent: If no, request that the patient contact the pharmacy for the refill.) (Agent: If yes, when and what did the pharmacy advise?)  Preferred Pharmacy (with phone number or street name): [facility] [address] - [place], [place] - [address], [phone] (Phone) [phone] (Fax)  Agent: Please be advised that RX refills may take up to 3 business days. We ask that you follow-up with your pharmacy.

## 2018-08-13 ENCOUNTER — Encounter: Payer: Self-pay | Admitting: Medical

## 2018-08-14 ENCOUNTER — Telehealth: Payer: Self-pay | Admitting: Medical

## 2018-08-14 NOTE — Telephone Encounter (Signed)
Opened to review 

## 2018-08-16 ENCOUNTER — Encounter: Payer: Self-pay | Admitting: Medical

## 2018-08-16 ENCOUNTER — Telehealth: Payer: Self-pay | Admitting: Obstetrics & Gynecology

## 2018-08-16 NOTE — Addendum Note (Signed)
Addended by: Caffie Pinto on: 08/16/2018 03:33 PM   Modules accepted: Orders

## 2018-08-16 NOTE — Telephone Encounter (Signed)
Spoke with patient.  She has been sick with the flu for one week. Has not taken any medications at all.  Finally today feeling better and tolerating fluids/food.   Has not been having hot flashes since not taking her medications.  Reviewed with Dr. [name] Heck, patient can decrease Gabapentin to 600 mg po QHS and DC paxil.   She denies thoughts of self harm or harm to anyone else. Has feelings of "crying all the time" and increased stress. She states she knows her "hormones are all over the place." Had these feelings for "months" and prior to starting Paxil. She declines appointment with Dr. [name] Heck at this time.  She will decrease her dose of Gabapentin and call back in one week with update on her symptoms.   Advised to call back any time with questions or concerns/  Pt verbalized understanding.

## 2018-08-16 NOTE — Telephone Encounter (Signed)
Patient called and stated that she went to see PCP and was advised that she might want to try going off of the Gabapentin 300MG . Patient stated that she has been extremely depressed lately. Patient stated that when her husband leaves the house she starts crying thinking that he's not going to come back. She says that she cannot think clearly and doesn't know if this could be from medication or not.

## 2018-08-27 ENCOUNTER — Telehealth: Payer: Self-pay | Admitting: Obstetrics & Gynecology

## 2018-08-27 ENCOUNTER — Encounter: Payer: Self-pay | Admitting: Obstetrics & Gynecology

## 2018-08-27 NOTE — Telephone Encounter (Signed)
Call to patient. Patient states that her anxiety has improved after stopping her medication and states her hot flashes have stopped. Patient complaining of vaginal dryness and itching. Denies discharge or odor. States the symptoms are intermittent, but she has noticed them more. States she not used anything OTC for treatment. OV recommended- patient agreeable. Patient scheduled for Monday 08-30-2018 at 1515. Patient agreeable to date and time of appointment. Advised if symptoms worsen over the weekend could be seen at [facility]. Patient verbalized understanding.   Routing to provider and will close encounter.

## 2018-08-27 NOTE — Telephone Encounter (Signed)
Patient sent the following correspondence through [place]. Routing to triage to assist patient with request.  I'm doing much better with anxiety and feeling weird since not taking the meds. My hot flashes seemed to have stopped.   The only symptom I seem to be having is lots of vaginal dryness and itching some. Is there something to take over the counter for that?

## 2018-08-30 ENCOUNTER — Encounter: Payer: Self-pay | Admitting: Obstetrics & Gynecology

## 2018-08-30 ENCOUNTER — Ambulatory Visit: Payer: BLUE CROSS/BLUE SHIELD | Admitting: Obstetrics & Gynecology

## 2018-08-30 VITALS — BP 110/80 | HR 106 | Ht 64.0 in | Wt 162.0 lb

## 2018-08-30 DIAGNOSIS — R3129 Other microscopic hematuria: Secondary | ICD-10-CM | POA: Diagnosis not present

## 2018-08-30 DIAGNOSIS — R350 Frequency of micturition: Secondary | ICD-10-CM

## 2018-08-30 DIAGNOSIS — N898 Other specified noninflammatory disorders of vagina: Secondary | ICD-10-CM

## 2018-08-30 LAB — POCT URINALYSIS DIPSTICK
Bilirubin, UA: NEGATIVE
GLUCOSE UA: NEGATIVE
Ketones, UA: NEGATIVE
LEUKOCYTES UA: NEGATIVE
NITRITE UA: NEGATIVE
Protein, UA: NEGATIVE
UROBILINOGEN UA: 0.2 U/dL
pH, UA: 7 (ref 5.0–8.0)

## 2018-08-30 NOTE — Progress Notes (Signed)
GYNECOLOGY  VISIT  CC:   Vaginal dryness  HPI: 48 y.o. G55P0101 Married White or Caucasian female here for vaginal dryness about five or six days ago.  She was on an antibiotic cream that she used topically on her hand/arm/feet after developing a rash.  This occurred after moving into a new home on January 10th.  She started the cream about 10 days ago.  She had the flu in late January.  She was on tamiflu when she had the flu.  Denies vaginal bleeding or vaginal discharge.  Denies pelvic pain.  Also feels she has urinary urgency that has been going on for the last 5-ish days.    GYNECOLOGIC HISTORY: Patient's last menstrual period was 04/27/2017. Contraception: PMP Menopausal hormone therapy: none  Patient Active Problem List   Diagnosis Date Noted  . Tachycardia 08/05/2017  . Right shoulder pain 12/18/2016  . Wellness examination 11/21/2015  . Diabetes ([place]) 09/04/2015  . Allergy with anaphylaxis due to peanuts 08/13/2014  . Abnormal Pap smear of cervix  remote Leep  1996 per pt 08/13/2014  . Allergic rhinitis 08/13/2014  . S/P endometrial ablation 08/13/2014  . GERD (gastroesophageal reflux disease) 08/13/2014  . Diarrhea post cholecystectomy since 1999 08/13/2014  . FHx: colon cancer  father  08/13/2014  . 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 Headache, migraine 08/02/2012  . Family history of breast cancer 01/06/2012  . Family history of malignant neoplasm of ovary 01/06/2012    Past Medical History:  Diagnosis Date  . Abnormal Pap smear of cervix    h/o colposcopy/biopsy  . Allergy   . Asthma    HX  . Breast cancer ([place]) 2007  . Cancer ([place]) [id number]   ovarian  . Diabetes mellitus without complication ([place]) 16/3845    diagnosed by PCP, [name]   . GERD (gastroesophageal reflux disease)   . Hyperlipemia    on medication     Past Surgical History:  Procedure Laterality Date  . APPENDECTOMY     age [age]  . BREAST LUMPECTOMY Left 2008    left breast  . CHOLECYSTECTOMY  2000  . ENDOMETRIAL ABLATION  2009  . LAPAROSCOPIC SALPINGO OOPHERECTOMY Left 1996   with right tubal ligation  . TUBAL LIGATION      MEDS:   Current Outpatient Medications on File Prior to Visit  Medication Sig Dispense Refill  . aspirin 81 MG tablet Take 81 mg by mouth daily.    [name] EPINEPHrine 0.3 mg/0.3 mL IJ SOAJ injection Inject once into muscle for any allergic reaction to peanut exposure 2 Device 1  . ezetimibe (ZETIA) 10 MG tablet Take 1 tablet (10 mg total) by mouth daily. 90 tablet 1  . Lancets (ONETOUCH ULTRASOFT) lancets Check blood sugar daily as directed. 100 each 12  . metFORMIN (GLUCOPHAGE-XR) 500 MG 24 hr tablet 2 tab po bid 120 tablet 3  . omeprazole (PRILOSEC) 20 MG capsule Take 1 capsule (20 mg total) by mouth daily. 90 capsule 3  . ondansetron (ZOFRAN ODT) 8 MG disintegrating tablet Take 1 tablet (8 mg total) by mouth every 8 (eight) hours as needed for nausea or vomiting. 20 tablet 0  . ONETOUCH VERIO test strip TEST ONCE DAILY AS DIRECTED 100 each 11  . gabapentin (NEURONTIN) 300 MG capsule Take 3 tablets nightly (Patient not taking: Reported on 08/30/2018) 90 capsule 8   No current facility-administered medications on file prior to visit.     ALLERGIES: Latex; Lidocaine; Peanut-containing drug products;  Sulfa antibiotics; Adhesive [tape]; Canagliflozin; Reglan [metoclopramide]; Ciprofloxacin; and Sulfur  Family History  Problem Relation Age of Onset  . Diabetes Mother   . Colon cancer Father   . Breast cancer Maternal Aunt   . Ovarian cancer Maternal Aunt   . Ovarian cancer Cousin   . Colon cancer Cousin   . Melanoma Cousin   . Breast cancer Maternal Grandmother   . Cancer Other        breast and ovarian-maternal side    SH:  Married, non smoker  Review of Systems  Genitourinary: Positive for frequency.       Vaginal itching   All other systems reviewed and are negative.   PHYSICAL EXAMINATION:    BP 110/80 (BP  Location: Right Arm, Patient Position: Sitting, Cuff Size: Normal)   Pulse (!) 106   Ht 5\' 4"  (1.626 m)   Wt 162 lb (73.5 kg)   LMP 04/27/2017 Comment: spotting  BMI 27.81 kg/m     General appearance: alert, cooperative and appears stated age Lymph:  no inguinal LAD noted  Pelvic: External genitalia:  no lesions              Urethra:  normal appearing urethra with no masses, tenderness or lesions              Bartholins and Skenes: normal                 Vagina: normal appearing vagina with normal color and discharge, no lesions              Cervix: no lesions              Bimanual Exam:  Uterus:  normal size, contour, position, consistency, mobility, non-tender              Adnexa: no mass, fullness, tenderness  Chaperone was present for exam.  Assessment: Vaginal erythema and dryness Urinary urgency Microscopic hematuria  Plan: Urine micro and culture pending Affirm pending If above is negative, will treat with Vit E vaginal suppositories

## 2018-08-31 LAB — URINALYSIS, MICROSCOPIC ONLY
Casts: NONE SEEN /lpf
WBC, UA: NONE SEEN /hpf (ref 0–5)

## 2018-08-31 LAB — URINE CULTURE

## 2018-08-31 LAB — VAGINITIS/VAGINOSIS, DNA PROBE
CANDIDA SPECIES: NEGATIVE
Gardnerella vaginalis: NEGATIVE
Trichomonas vaginosis: NEGATIVE

## 2018-09-01 MED ORDER — NONFORMULARY OR COMPOUNDED ITEM: 1 refills | Status: DC

## 2018-09-01 NOTE — Addendum Note (Signed)
Addended by: Megan Salon on: 09/01/2018 02:25 PM   Modules accepted: Orders

## 2018-09-02 ENCOUNTER — Other Ambulatory Visit: Payer: Self-pay

## 2018-09-02 ENCOUNTER — Encounter: Payer: Self-pay | Admitting: Hematology & Oncology

## 2018-09-02 ENCOUNTER — Inpatient Hospital Stay: Payer: BLUE CROSS/BLUE SHIELD | Attending: Hematology & Oncology | Admitting: Hematology & Oncology

## 2018-09-02 ENCOUNTER — Inpatient Hospital Stay: Payer: BLUE CROSS/BLUE SHIELD

## 2018-09-02 VITALS — BP 110/78 | HR 97 | Temp 97.8°F | Resp 18 | Wt 165.0 lb

## 2018-09-02 DIAGNOSIS — R197 Diarrhea, unspecified: Secondary | ICD-10-CM

## 2018-09-02 DIAGNOSIS — N62 Hypertrophy of breast: Secondary | ICD-10-CM | POA: Insufficient documentation

## 2018-09-02 DIAGNOSIS — Z8543 Personal history of malignant neoplasm of ovary: Secondary | ICD-10-CM | POA: Diagnosis not present

## 2018-09-02 DIAGNOSIS — Z803 Family history of malignant neoplasm of breast: Secondary | ICD-10-CM

## 2018-09-02 DIAGNOSIS — E119 Type 2 diabetes mellitus without complications: Secondary | ICD-10-CM | POA: Diagnosis not present

## 2018-09-02 DIAGNOSIS — E0800 Diabetes mellitus due to underlying condition with hyperosmolarity without nonketotic hyperglycemic-hyperosmolar coma (NKHHC): Secondary | ICD-10-CM

## 2018-09-02 DIAGNOSIS — T7801XA Anaphylactic reaction due to peanuts, initial encounter: Secondary | ICD-10-CM

## 2018-09-02 DIAGNOSIS — Z794 Long term (current) use of insulin: Secondary | ICD-10-CM

## 2018-09-02 DIAGNOSIS — M25511 Pain in right shoulder: Secondary | ICD-10-CM

## 2018-09-02 DIAGNOSIS — G8929 Other chronic pain: Secondary | ICD-10-CM

## 2018-09-02 DIAGNOSIS — K219 Gastro-esophageal reflux disease without esophagitis: Secondary | ICD-10-CM

## 2018-09-02 LAB — CMP (CANCER CENTER ONLY)
ALBUMIN: 4.3 g/dL (ref 3.5–5.0)
ALT: 46 U/L — ABNORMAL HIGH (ref 0–44)
ANION GAP: 8 (ref 5–15)
AST: 29 U/L (ref 15–41)
Alkaline Phosphatase: 83 U/L (ref 38–126)
BUN: 12 mg/dL (ref 6–20)
CO2: 29 mmol/L (ref 22–32)
Calcium: 9.7 mg/dL (ref 8.9–10.3)
Chloride: 102 mmol/L (ref 98–111)
Creatinine: 0.68 mg/dL (ref 0.44–1.00)
GFR, Est AFR Am: >60 mL/min (ref >60)
GLUCOSE: 149 mg/dL — AB (ref 70–99)
POTASSIUM: 4 mmol/L (ref 3.5–5.1)
Sodium: 139 mmol/L (ref 135–145)
Total Bilirubin: 0.4 mg/dL (ref 0.3–1.2)
Total Protein: 6.6 g/dL (ref 6.5–8.1)

## 2018-09-02 LAB — CBC WITH DIFFERENTIAL (CANCER CENTER ONLY)
Abs Immature Granulocytes: 0.05 10*3/uL (ref 0.00–0.07)
BASOS ABS: 0 10*3/uL (ref 0.0–0.1)
Basophils Relative: 1 %
Eosinophils Absolute: 0.1 10*3/uL (ref 0.0–0.5)
Eosinophils Relative: 2 %
HCT: 39.5 % (ref 36.0–46.0)
HEMOGLOBIN: 12.5 g/dL (ref 12.0–15.0)
IMMATURE GRANULOCYTES: 1 %
LYMPHS ABS: 2 10*3/uL (ref 0.7–4.0)
LYMPHS PCT: 27 %
MCH: 28.8 pg (ref 26.0–34.0)
MCHC: 31.6 g/dL (ref 30.0–36.0)
MCV: 91 fL (ref 80.0–100.0)
Monocytes Absolute: 0.5 10*3/uL (ref 0.1–1.0)
Monocytes Relative: 6 %
NEUTROS PCT: 63 %
NRBC: 0 % (ref 0.0–0.2)
Neutro Abs: 4.8 10*3/uL (ref 1.7–7.7)
Platelet Count: 330 10*3/uL (ref 150–400)
RBC: 4.34 MIL/uL (ref 3.87–5.11)
RDW: 13.1 % (ref 11.5–15.5)
WBC: 7.5 10*3/uL (ref 4.0–10.5)

## 2018-09-02 LAB — LACTATE DEHYDROGENASE: LDH: 129 U/L (ref 98–192)

## 2018-09-02 NOTE — Progress Notes (Signed)
Hematology and Oncology Follow Up Visit  Carolyn Wilson [id number] 05/16/1971 48 y.o. 09/02/2018   Principle Diagnosis:  History of Sertoli-Leydig tumor of the left ovary  Strong family history of breast and ovarian cancer - BRCA 1/2 and p53 negative  Current Therapy:   Observation    Interim History:  Carolyn Wilson is back for follow-up.  She is doing quite well.  She is going to have another new job.  When I saw her back in August, she was started a new job with a [employer].  She apparently found an even better opportunity.  This is with [employer].  She apparently had a breast biopsy of the left breast back in November.  She apparently had an abnormal MRI at that time.  The pathology report [id number]) showed usual ductal hyperplasia.  There is no DCIS or lobular carcinoma in situ.  Her blood sugars are doing okay.  They are fluctuating a little bit because of her hormonal changes from the menopause.  Her hemoglobin A1c was only 6.7.  She has had no issues with nausea or vomiting.  There is been no change in bowel or bladder habits.  She has had no rashes.  There is been no bleeding.  She has had no fever.  Her boy is doing quite well.  He is with the [employer].  He is in training right now.  Her overall, her performance status is ECOG 0.  Medications:  Allergies as of 09/02/2018      Reactions   Latex Rash   Causes blisters   Lidocaine Nausea And Vomiting   Peanut-containing Drug Products Anaphylaxis   Sulfa Antibiotics Rash, Shortness Of Breath   Shortness of breath, rash Shortness of breath, rash   Adhesive [tape]    Canagliflozin Other (See Comments)   Reglan [metoclopramide] Tinitus   Ciprofloxacin Rash   Sulfur Rash      Medication List       Accurate as of September 02, 2018  8:44 AM. Always use your most recent med list.        aspirin 81 MG tablet Take 81 mg by mouth daily.   EPINEPHrine 0.3 mg/0.3 mL Soaj injection Commonly known as:   EPI-PEN Inject once into muscle for any allergic reaction to peanut exposure   ezetimibe 10 MG tablet Commonly known as:  ZETIA Take 1 tablet (10 mg total) by mouth daily.   metFORMIN 500 MG 24 hr tablet Commonly known as:  GLUCOPHAGE-XR 2 tab po bid   NONFORMULARY OR COMPOUNDED ITEM Vitamin E vaginal suppositories 200u/ml.  One pv three times weekly.   omeprazole 20 MG capsule Commonly known as:  PRILOSEC Take 1 capsule (20 mg total) by mouth daily.   ondansetron 8 MG disintegrating tablet Commonly known as:  ZOFRAN ODT Take 1 tablet (8 mg total) by mouth every 8 (eight) hours as needed for nausea or vomiting.   onetouch ultrasoft lancets Check blood sugar daily as directed.   ONETOUCH VERIO test strip Generic drug:  glucose blood TEST ONCE DAILY AS DIRECTED       Allergies:  Allergies  Allergen Reactions  . Latex Rash    Causes blisters  . Lidocaine Nausea And Vomiting  . Peanut-Containing Drug Products Anaphylaxis  . Sulfa Antibiotics Rash and Shortness Of Breath    Shortness of breath, rash Shortness of breath, rash  . Adhesive [Tape]   . Canagliflozin Other (See Comments)  . Reglan [Metoclopramide] Tinitus  . Ciprofloxacin Rash  .  Sulfur Rash    Past Medical History, Surgical history, Social history, and Family History were reviewed and updated.  Review of Systems: Review of Systems  Constitutional: Negative.   HENT: Negative.   Eyes: Negative.   Respiratory: Negative.   Cardiovascular: Positive for palpitations.  Gastrointestinal: Negative.   Genitourinary: Negative.   Musculoskeletal: Negative.   Skin: Positive for rash.  Neurological: Negative.   Endo/Heme/Allergies: Negative.   Psychiatric/Behavioral: Negative.      Physical Exam:  weight is 165 lb (74.8 kg). Her oral temperature is 97.8 F (36.6 C). Her blood pressure is 110/78 and her pulse is 97. Her respiration is 18 and oxygen saturation is 98%.   Wt Readings from Last 3  Encounters:  09/02/18 165 lb (74.8 kg)  08/30/18 162 lb (73.5 kg)  08/11/18 163 lb 6.4 oz (74.1 kg)    Physical Exam Vitals signs reviewed.  HENT:     Head: Normocephalic and atraumatic.  Eyes:     Pupils: Pupils are equal, round, and reactive to light.  Neck:     Musculoskeletal: Normal range of motion.  Cardiovascular:     Rate and Rhythm: Normal rate and regular rhythm.     Heart sounds: Normal heart sounds.  Pulmonary:     Effort: Pulmonary effort is normal.     Breath sounds: Normal breath sounds.  Abdominal:     General: Bowel sounds are normal.     Palpations: Abdomen is soft.  Musculoskeletal: Normal range of motion.        General: No tenderness or deformity.  Lymphadenopathy:     Cervical: No cervical adenopathy.  Skin:    General: Skin is warm and dry.     Findings: No erythema or rash.  Neurological:     Mental Status: She is alert and oriented to person, place, and time.  Psychiatric:        Behavior: Behavior normal.        Thought Content: Thought content normal.        Judgment: Judgment normal.    .   Lab Results  Component Value Date   WBC 7.5 09/02/2018   HGB 12.5 09/02/2018   HCT 39.5 09/02/2018   MCV 91.0 09/02/2018   PLT 330 09/02/2018   No results found for: FERRITIN, IRON, TIBC, UIBC, IRONPCTSAT Lab Results  Component Value Date   RBC 4.34 09/02/2018   No results found for: KPAFRELGTCHN, LAMBDASER, KAPLAMBRATIO No results found for: IGGSERUM, IGA, IGMSERUM No results found for: Odetta Pink, SPEI   Chemistry      Component Value Date/Time   NA 143 03/09/2018 0730   NA 144 03/11/2017 0822   K 4.8 03/09/2018 0730   K 4.5 03/11/2017 0822   CL 104 03/09/2018 0730   CL 104 03/11/2017 0822   CO2 31 03/09/2018 0730   CO2 29 03/11/2017 0822   BUN 11 03/09/2018 0730   BUN 9 03/11/2017 0822   CREATININE 0.92 03/09/2018 0730   CREATININE 0.80 03/02/2018 0831   CREATININE 1.2  03/11/2017 0822      Component Value Date/Time   CALCIUM 10.3 03/09/2018 0730   CALCIUM 9.2 03/11/2017 0822   ALKPHOS 68 03/09/2018 0730   ALKPHOS 65 03/11/2017 0822   AST 20 03/09/2018 0730   AST 31 03/02/2018 0831   ALT 16 03/09/2018 0730   ALT 28 03/02/2018 0831   ALT 25 03/11/2017 0822   BILITOT 0.5 03/09/2018 0730   BILITOT  0.6 03/02/2018 0831     Impression and Plan: Carolyn Wilson is a 48 yo post-menopausal female with a 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   I do not see any issues from my point of view.  I think from my point of view, diabetes is can be her biggest medical issue.  I am sure that she is doing a great job trying to control her blood sugars.  We will plan to get her back in 6 more months.    Volanda Napoleon, MD 2/13/20208:44 AM

## 2018-09-03 ENCOUNTER — Encounter: Payer: Self-pay | Admitting: *Deleted

## 2018-09-03 ENCOUNTER — Encounter

## 2018-09-03 ENCOUNTER — Encounter: Payer: Self-pay | Admitting: Obstetrics & Gynecology

## 2018-09-03 ENCOUNTER — Telehealth: Payer: Self-pay | Admitting: *Deleted

## 2018-09-03 LAB — VITAMIN D 25 HYDROXY (VIT D DEFICIENCY, FRACTURES): Vit D, 25-Hydroxy: 27.8 ng/mL — ABNORMAL LOW (ref 30.0–100.0)

## 2018-09-03 NOTE — Telephone Encounter (Signed)
picked up my Vitamin E today just a quick question I thought I was going to take 2x a week but the direction on package says 3x. I just want to make sure I'm doing it correctly.   Thanks  [name]

## 2018-09-03 NOTE — Telephone Encounter (Signed)
Routing to Dr. [name] Heck to confirm instructions for use for Vaginal Vit E.  2 or 3 times per week for this patient?

## 2018-09-03 NOTE — Telephone Encounter (Signed)
Two times is fine.  I write for the three times because the cost is about the same and will last a little longer.  Thanks.

## 2018-09-06 NOTE — Telephone Encounter (Signed)
Messaged patient via mychart.  Encounter closed.

## 2018-09-24 ENCOUNTER — Ambulatory Visit: Payer: BC Managed Care – PPO | Admitting: Obstetrics & Gynecology

## 2018-10-03 ENCOUNTER — Other Ambulatory Visit: Payer: Self-pay | Admitting: Medical

## 2018-10-03 DIAGNOSIS — E785 Hyperlipidemia, unspecified: Principal | ICD-10-CM

## 2018-10-03 DIAGNOSIS — E1169 Type 2 diabetes mellitus with other specified complication: Secondary | ICD-10-CM

## 2018-10-11 ENCOUNTER — Encounter: Payer: Self-pay | Admitting: Medical

## 2018-10-11 MED ORDER — METFORMIN HCL ER 500 MG PO TB24: 1000.0000 mg | ORAL_TABLET | Freq: Two times a day (BID) | ORAL | 3 refills | Status: DC

## 2018-10-25 ENCOUNTER — Encounter: Payer: Self-pay | Admitting: Obstetrics & Gynecology

## 2018-10-26 ENCOUNTER — Telehealth: Payer: Self-pay | Admitting: Obstetrics & Gynecology

## 2018-10-26 ENCOUNTER — Encounter: Payer: Self-pay | Admitting: Medical

## 2018-10-26 DIAGNOSIS — N6041 Mammary duct ectasia of right breast: Secondary | ICD-10-CM

## 2018-10-26 DIAGNOSIS — N6099 Unspecified benign mammary dysplasia of unspecified breast: Secondary | ICD-10-CM

## 2018-10-26 DIAGNOSIS — Z1231 Encounter for screening mammogram for malignant neoplasm of breast: Secondary | ICD-10-CM

## 2018-10-26 DIAGNOSIS — N6489 Other specified disorders of breast: Secondary | ICD-10-CM

## 2018-10-26 NOTE — Telephone Encounter (Signed)
Message   Good Morning! I received a letter in my chart saying it was time for my MRI. Is that something you all will schedule?   Thanks,  Carolyn Wilson

## 2018-10-26 NOTE — Telephone Encounter (Signed)
Spoke with patient. 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 Advised order placed for precert. [facility] will call patient directly to schedule. Advised scheduling may be altered due to COVID 19 state of emergency. Patient verbalizes understanding. Order signed as Dr.Miller so this will return to her inbox for review. This will need precert.  Cc: Thayer Ohm

## 2018-11-10 ENCOUNTER — Ambulatory Visit (INDEPENDENT_AMBULATORY_CARE_PROVIDER_SITE_OTHER): Payer: 59 | Admitting: Medical

## 2018-11-10 ENCOUNTER — Other Ambulatory Visit: Payer: Self-pay | Admitting: Medical

## 2018-11-10 ENCOUNTER — Encounter: Payer: Self-pay | Admitting: Medical

## 2018-11-10 ENCOUNTER — Other Ambulatory Visit: Payer: Self-pay

## 2018-11-10 DIAGNOSIS — K219 Gastro-esophageal reflux disease without esophagitis: Secondary | ICD-10-CM | POA: Diagnosis not present

## 2018-11-10 DIAGNOSIS — E1169 Type 2 diabetes mellitus with other specified complication: Secondary | ICD-10-CM

## 2018-11-10 DIAGNOSIS — E785 Hyperlipidemia, unspecified: Secondary | ICD-10-CM | POA: Diagnosis not present

## 2018-11-10 DIAGNOSIS — E119 Type 2 diabetes mellitus without complications: Secondary | ICD-10-CM

## 2018-11-10 MED ORDER — GLUCOSE BLOOD VI STRP: ORAL_STRIP | 11 refills | Status: DC

## 2018-11-10 NOTE — Progress Notes (Signed)
Subjective:    Patient ID: Carolyn Wilson, female    DOB: 1970/07/27, 48 y.o.   MRN: [id number]  HPI Virtual Visit via Video Note  I connected with [name] on 11/10/18 at  2:00 PM EDT by a video enabled telemedicine application and verified that I am speaking with the correct person using two identifiers.   I discussed the limitations of evaluation and management by telemedicine and the availability of in person appointments. The patient expressed understanding and agreed to proceed.  Pt is at work and I am at home.   History of Present Illness: Pt is diabetic. She is on metformin 500 mg xr. 2 tabs daily. She has been exercising. Last a1c august of last year was 6.6. In past had considered adding low dose onglyza and wrote script. Pt thinks new insurance might cover now. Sugars now in am 140. Later in day 80-90.  Hx of high cholesterol in the past. No side effect with zetia but could not tolerate statin.  Pt gerd has been controlled with diet and omeprazole.           Observations/Objective:   Assessment and Plan: Diabetes controlled relatively well in the past by a1c past august. Will continue metformin and follow a1c. If a1c greater than 6.5 will add onglyza again as new insurance may cover.  For high cholesterol continue zetia. If on repeat lab triglyceride still high then add low dose fenofibrate. Pt can't tolerate statin.  For gerd continue with healthy diet and omeprazole.  Future lab placed cmp, lipid panel and a1c. Pt will call gyn office to see if her May appointment canceled due to covid. If so explained could add on hormone labs and send her gyn results.  Follow up date in office to be determined after lab review.  Mackie Pai, PA-C  Follow Up Instructions:    I discussed the assessment and treatment plan with the patient. The patient was provided an opportunity to ask questions and all were answered. The patient agreed with the plan and  demonstrated an understanding of the instructions.   The patient was advised to call back or seek an in-person evaluation if the symptoms worsen or if the condition fails to improve as anticipated.     Mackie Pai, PA-C    Review of Systems  Constitutional: Negative for chills, fatigue and fever.  Respiratory: Negative for cough, chest tightness, shortness of breath and wheezing.   Cardiovascular: Negative for chest pain and palpitations.  Gastrointestinal: Negative for abdominal pain, diarrhea and nausea.  Endocrine: Negative for polydipsia, polyphagia and polyuria.  Musculoskeletal: Negative for back pain.  Neurological: Negative for syncope, facial asymmetry, weakness and headaches.  Hematological: Negative for adenopathy. Does not bruise/bleed easily.   Past Medical History:  Diagnosis Date  . Abnormal Pap smear of cervix    h/o colposcopy/biopsy  . Allergy   . Asthma    HX  . Breast cancer ([place]) 2007  . Cancer ([place]) [id number]   ovarian  . Diabetes mellitus without complication ([place]) 03/2329    diagnosed by PCP, [name]   . GERD (gastroesophageal reflux disease)   . Hyperlipemia    on medication      Social History   Socioeconomic History  . Marital status: Married    Spouse name: Not on file  . Number of children: Not on file  . Years of education: Not on file  . Highest education level: Not on file  Occupational History  .  Not on file  Social Needs  . Financial resource strain: Not on file  . Food insecurity:    Worry: Not on file    Inability: Not on file  . Transportation needs:    Medical: Not on file    Non-medical: Not on file  Tobacco Use  . Smoking status: Never Smoker  . Smokeless tobacco: Never Used  . Tobacco comment: NEVER USED TOBACCO  Substance and Sexual Activity  . Alcohol use: Yes    Alcohol/week: 2.0 standard drinks    Types: 2 Standard drinks or equivalent per week  . Drug use: No  . Sexual activity: Yes    Partners: Male     Birth control/protection: Surgical  Lifestyle  . Physical activity:    Days per week: Not on file    Minutes per session: Not on file  . Stress: Not on file  Relationships  . Social connections:    Talks on phone: Not on file    Gets together: Not on file    Attends religious service: Not on file    Active member of club or organization: Not on file    Attends meetings of clubs or organizations: Not on file    Relationship status: Not on file  . Intimate partner violence:    Fear of current or ex partner: Not on file    Emotionally abused: Not on file    Physically abused: Not on file    Forced sexual activity: Not on file  Other Topics Concern  . Not on file  Social History Narrative  . Not on file    Past Surgical History:  Procedure Laterality Date  . APPENDECTOMY     age 8-9  . BREAST LUMPECTOMY Left 2008   left breast  . CHOLECYSTECTOMY  2000  . ENDOMETRIAL ABLATION  2009  . LAPAROSCOPIC SALPINGO OOPHERECTOMY Left 1996   with right tubal ligation  . TUBAL LIGATION      Family History  Problem Relation Age of Onset  . Diabetes Mother   . Colon cancer Father   . Breast cancer Maternal Aunt   . Ovarian cancer Maternal Aunt   . Ovarian cancer Cousin   . Colon cancer Cousin   . Melanoma Cousin   . Breast cancer Maternal Grandmother   . Cancer Other        breast and ovarian-maternal side    Allergies  Allergen Reactions  . Latex Rash    Causes blisters  . Lidocaine Nausea And Vomiting  . Peanut-Containing Drug Products Anaphylaxis  . Sulfa Antibiotics Rash and Shortness Of Breath    Shortness of breath, rash Shortness of breath, rash  . Adhesive [Tape]   . Canagliflozin Other (See Comments)  . Reglan [Metoclopramide] Tinitus  . Ciprofloxacin Rash  . Sulfur Rash    Current Outpatient Medications on File Prior to Visit  Medication Sig Dispense Refill  . aspirin 81 MG tablet Take 81 mg by mouth daily.    [name] EPINEPHrine 0.3 mg/0.3 mL IJ SOAJ  injection Inject once into muscle for any allergic reaction to peanut exposure 2 Device 1  . ezetimibe (ZETIA) 10 MG tablet TAKE ONE TABLET BY MOUTH ONE TIME DAILY 30 tablet 5  . Lancets (ONETOUCH ULTRASOFT) lancets Check blood sugar daily as directed. 100 each 12  . metFORMIN (GLUCOPHAGE-XR) 500 MG 24 hr tablet Take 2 tablets (1,000 mg total) by mouth 2 (two) times daily. 120 tablet 3  . NONFORMULARY OR COMPOUNDED ITEM Vitamin  E vaginal suppositories 200u/ml.  One pv three times weekly. 36 each 1  . omeprazole (PRILOSEC) 20 MG capsule Take 1 capsule (20 mg total) by mouth daily. 90 capsule 3  . ondansetron (ZOFRAN ODT) 8 MG disintegrating tablet Take 1 tablet (8 mg total) by mouth every 8 (eight) hours as needed for nausea or vomiting. 20 tablet 0   No current facility-administered medications on file prior to visit.     LMP 04/27/2017 Comment: spotting      Objective:   Physical Exam See objective.      Assessment & Plan:

## 2018-11-10 NOTE — Patient Instructions (Addendum)
Diabetes controlled relatively well in the past by a1c past august. Will continue metformin and follow a1c. If a1c greater than 6.5 will add onglyza again as new insurance may cover.  For high cholesterol continue zetia. If on repeat lab triglyceride still high then add low dose fenofibrate. Pt can't tolerate statin.  For gerd continue with healthy diet and omeprazole.  Future lab placed cmp, lipid panel and a1c. Pt will call gyn office to see if her May appointment canceled due to covid. If so explained could add on hormone labs and send her gyn results.  Follow up date in office to be determined after lab review.

## 2018-11-17 ENCOUNTER — Encounter: Payer: Self-pay | Admitting: Medical

## 2018-11-17 ENCOUNTER — Ambulatory Visit (INDEPENDENT_AMBULATORY_CARE_PROVIDER_SITE_OTHER): Payer: 59 | Admitting: Medical

## 2018-11-17 ENCOUNTER — Other Ambulatory Visit: Payer: Self-pay

## 2018-11-17 ENCOUNTER — Encounter: Payer: Self-pay | Admitting: Obstetrics & Gynecology

## 2018-11-17 ENCOUNTER — Telehealth: Payer: Self-pay | Admitting: Obstetrics & Gynecology

## 2018-11-17 DIAGNOSIS — E119 Type 2 diabetes mellitus without complications: Secondary | ICD-10-CM

## 2018-11-17 DIAGNOSIS — N6489 Other specified disorders of breast: Secondary | ICD-10-CM

## 2018-11-17 DIAGNOSIS — Z9189 Other specified personal risk factors, not elsewhere classified: Secondary | ICD-10-CM

## 2018-11-17 DIAGNOSIS — N6041 Mammary duct ectasia of right breast: Secondary | ICD-10-CM

## 2018-11-17 DIAGNOSIS — N6099 Unspecified benign mammary dysplasia of unspecified breast: Secondary | ICD-10-CM

## 2018-11-17 DIAGNOSIS — S90425A Blister (nonthermal), left lesser toe(s), initial encounter: Secondary | ICD-10-CM

## 2018-11-17 DIAGNOSIS — Z1231 Encounter for screening mammogram for malignant neoplasm of breast: Secondary | ICD-10-CM

## 2018-11-17 NOTE — Telephone Encounter (Signed)
Patient sent the following message through [place]. Routing to triage to assist patient with request.  Good Morning I hope all of you are well! I was told to let you know if I haven't heard back about my MRI to get scheduled. They haven't called me as of today.   Thanks,  Carolyn Wilson

## 2018-11-17 NOTE — Progress Notes (Signed)
   Subjective:    Patient ID: Carolyn Wilson, female    DOB: [birth year], 48 y.o.   MRN: [id number]  HPI  Virtual Visit via Video Note  I connected with [name] on 11/17/18 at  9:40 AM EDT by a video enabled telemedicine application and verified that I am speaking with the correct person using two identifiers.   I discussed the limitations of evaluation and management by telemedicine and the availability of in person appointments. The patient expressed understanding and agreed to proceed.  Pt doing virtual visit from office. I am my office as well. Exam virtual due to covid pandemic.  Pt did not check bp or pulse today.   History of Present Illness:  Pt noticed blister on left foot on Monday. Tuesday feel little more pain. On Sunday she walked 25,000 steps. So thinks this may have been factor. Normally she does between 10,0000-20,000 steps a day.  Pt sugar have been controlled. Sugar yesterday was 120 at 4:30.     Observations/Objective:  Pt has small blister left foot. Bottom aspect of foot base of 4th toe. 37mm approximate. Flat dome to blister. No bruising or black color. No surrounding redness around blister. No dc.    Assessment and Plan: Foot blister that appears to have occurred secondary to friction as she walked 25,000 steps day before onset of blister. Her sugars are well controlled. No signs or symptoms of infection presently on inspection by video. Advised not to premature rupture blister. Let it pop by itself. Tissue will heel better and will be less painful. Can apply topical antibiotic and bandaid over area. Check daily for any worsening or changing signs as discussed. If worrisome signs occur as explained then would need office here or would refer to podiatrist.  Reduce walking  distance over next week. If any recurrent pressure points to foot in future or blister that form may need preventative use of mole skin on very long walks.  Continue low sugar  diet and current diabetic meds.  Asked pt to update my 5-7 days by my chart as to how area is doing. But sooner if worsens as discussed.  Follow Up Instructions:    I discussed the assessment and treatment plan with the patient. The patient was provided an opportunity to ask questions and all were answered. The patient agreed with the plan and demonstrated an understanding of the instructions.   The patient was advised to call back or seek an in-person evaluation if the symptoms worsen or if the condition fails to improve as anticipated.     Mackie Pai, PA-C    Review of Systems  Constitutional: Negative for chills and fever.  Respiratory: Negative for choking.   Cardiovascular: Negative for chest pain and palpitations.  Skin:       See hpi. Blister.  Hematological: Negative for adenopathy. Does not bruise/bleed easily.       Objective:   Physical Exam        Assessment & Plan:

## 2018-11-17 NOTE — Patient Instructions (Addendum)
Foot blister that appears to have occurred secondary to friction as she walked 25,000 steps day before onset of blister. Her sugars are well controlled. No signs or symptoms of infection presently on inspection by video. Advised not to premature rupture blister. Let it pop by itself. Tissue will heel better and will be less painful. Can apply topical antibiotic and bandaid over area. Check daily for any worsening or changing signs as discussed. If worrisome signs occur as explained then would need office here or would refer to podiatrist.  Reduce walking  distance over next week. If any recurrent pressure points to foot in future or blister that form may need preventative use of mole skin on very long walks.  Continue low sugar diet and current diabetic meds.  Asked pt to update my 5-7 days by my chart as to how area is doing. But sooner if worsens as discussed.

## 2018-11-19 ENCOUNTER — Other Ambulatory Visit: Payer: Self-pay | Admitting: Obstetrics & Gynecology

## 2018-11-19 ENCOUNTER — Encounter: Payer: Self-pay | Admitting: Obstetrics & Gynecology

## 2018-11-19 DIAGNOSIS — Z1231 Encounter for screening mammogram for malignant neoplasm of breast: Secondary | ICD-10-CM

## 2018-11-19 NOTE — Telephone Encounter (Addendum)
Patient is scheduled 11/29/2018 at 8 am for Bilateral Breast MRI at [facility]. Spoke with patient who is agreeable to date and time. Placed in imaging hold.   Needs prior auth. Routing to [employer] and [employer].   Routing to provider and will close encounter.

## 2018-11-19 NOTE — Telephone Encounter (Signed)
Spoke with [facility] who state that they are unable to perform Breast MRI's at this time due to COVID 19. [facility] is performing Breast MRI's. Call to patient who would like to have this performed with [facility]. Order placed to [facility]. Spoke with [facility] who will call the patient directly to schedule her appointment.

## 2018-11-22 ENCOUNTER — Telehealth: Payer: Self-pay | Admitting: Obstetrics & Gynecology

## 2018-11-22 NOTE — Telephone Encounter (Signed)
Patient calling to check on my chart message about needing a mammogram before MRI.

## 2018-11-22 NOTE — Telephone Encounter (Signed)
Patient sent the following correspondence through [place]. Routing to triage to assist patient with request.  Kaitlyn,   I know I spoke with you earlier that they had scheduled my MRI but can you give me a call, they are changing again and want to do a mammogram 1st because of their protocol.   Thanks,  [name]

## 2018-11-23 ENCOUNTER — Encounter: Payer: Self-pay | Admitting: Medical

## 2018-11-23 NOTE — Telephone Encounter (Signed)
Per review of Care Everywhere last screening MMG 09/15/17 at [facility]. Call to [place], spoke with Prentiss Bells. Was advised updated screening MMG required before proceeding with breast MRI.

## 2018-11-23 NOTE — Telephone Encounter (Signed)
Spoke with patient, advised as seen below per [place]. Patient states this is not her yearly breast MRI, this is 6 mo f/u, does not want to do screening MMG and return after for MRI, is concerned may not be covered by insurance. Advised I will contact Nurse Navigator at [facility] for further clarification and return call. Patient agreeable.

## 2018-11-23 NOTE — Telephone Encounter (Signed)
Carolyn Wilson from [facility] returned call. Patient overdue for screening MMG, will need updated MMG prior to breast MRI as previously advised. If patient should have any additional questions, may return call directly to [facility].   Call returned to patient, advised as seen above. Patient states she may not return to [facility] for her screening MMG, may choose another location.  Advised patient is her decision on imaging location. Advised whichever location she chooses will instruct her on releasing records. Advised to contact the office if any additional questions or assitance is needed. Patient verbalizes understanding.   Routing to provider for final review. Patient is agreeable to disposition. Will close encounter.

## 2018-11-23 NOTE — Telephone Encounter (Signed)
Spoke with Manuela Schwartz, Nurse Navigator at [facility]. Manuela Schwartz will clarify recommendations and return call directly to patient to discuss.

## 2018-11-26 ENCOUNTER — Encounter: Payer: Self-pay | Admitting: Medical

## 2018-11-26 ENCOUNTER — Encounter: Payer: Self-pay | Admitting: Obstetrics & Gynecology

## 2018-11-26 DIAGNOSIS — Z1231 Encounter for screening mammogram for malignant neoplasm of breast: Secondary | ICD-10-CM | POA: Diagnosis not present

## 2018-11-26 DIAGNOSIS — Z1239 Encounter for other screening for malignant neoplasm of breast: Secondary | ICD-10-CM | POA: Diagnosis not present

## 2018-11-29 ENCOUNTER — Ambulatory Visit: Payer: 59

## 2018-11-29 ENCOUNTER — Ambulatory Visit: Payer: BLUE CROSS/BLUE SHIELD | Admitting: Obstetrics & Gynecology

## 2018-11-29 ENCOUNTER — Other Ambulatory Visit: Payer: 59

## 2018-12-01 ENCOUNTER — Encounter: Payer: Self-pay | Admitting: Obstetrics & Gynecology

## 2018-12-01 NOTE — Telephone Encounter (Signed)
Routing to Dr. Miller as FYI

## 2018-12-09 ENCOUNTER — Encounter: Payer: Self-pay | Admitting: Medical

## 2018-12-09 ENCOUNTER — Other Ambulatory Visit: Payer: Self-pay

## 2018-12-09 ENCOUNTER — Other Ambulatory Visit (INDEPENDENT_AMBULATORY_CARE_PROVIDER_SITE_OTHER): Payer: 59

## 2018-12-09 DIAGNOSIS — E119 Type 2 diabetes mellitus without complications: Secondary | ICD-10-CM

## 2018-12-09 DIAGNOSIS — E1169 Type 2 diabetes mellitus with other specified complication: Secondary | ICD-10-CM

## 2018-12-09 DIAGNOSIS — E785 Hyperlipidemia, unspecified: Secondary | ICD-10-CM

## 2018-12-09 LAB — COMPREHENSIVE METABOLIC PANEL
ALT: 29 U/L (ref 0–35)
AST: 25 U/L (ref 0–37)
Albumin: 3.8 g/dL (ref 3.5–5.2)
Alkaline Phosphatase: 67 U/L (ref 39–117)
BUN: 8 mg/dL (ref 6–23)
CO2: 27 mEq/L (ref 19–32)
Calcium: 8.7 mg/dL (ref 8.4–10.5)
Chloride: 103 mEq/L (ref 96–112)
Creatinine, Ser: 0.71 mg/dL (ref 0.40–1.20)
GFR: 88.06 mL/min (ref >60.00)
Glucose, Bld: 136 mg/dL — ABNORMAL HIGH (ref 70–99)
Potassium: 4.4 mEq/L (ref 3.5–5.1)
Sodium: 140 mEq/L (ref 135–145)
Total Bilirubin: 0.3 mg/dL (ref 0.2–1.2)
Total Protein: 6.3 g/dL (ref 6.0–8.3)

## 2018-12-09 LAB — LIPID PANEL
Cholesterol: 188 mg/dL (ref 0–200)
HDL: 45.2 mg/dL (ref >39.00)
LDL Cholesterol: 107 mg/dL — ABNORMAL HIGH (ref 0–99)
NonHDL: 142.75
Total CHOL/HDL Ratio: 4
Triglycerides: 180 mg/dL — ABNORMAL HIGH (ref 0.0–149.0)
VLDL: 36 mg/dL (ref 0.0–40.0)

## 2018-12-09 LAB — HEMOGLOBIN A1C: Hgb A1c MFr Bld: 7.4 % — ABNORMAL HIGH (ref 4.6–6.5)

## 2018-12-14 ENCOUNTER — Telehealth: Payer: Self-pay | Admitting: Medical

## 2018-12-14 ENCOUNTER — Other Ambulatory Visit: Payer: Self-pay | Admitting: Medical

## 2018-12-14 MED ORDER — SAXAGLIPTIN HCL 2.5 MG PO TABS: 2.5000 mg | ORAL_TABLET | Freq: Every day | ORAL | 3 refills | Status: DC

## 2018-12-14 NOTE — Telephone Encounter (Signed)
Rx onglyza sent to pharmacy.

## 2019-01-31 ENCOUNTER — Encounter: Payer: Self-pay | Admitting: Medical

## 2019-02-14 ENCOUNTER — Encounter: Payer: Self-pay | Admitting: Medical

## 2019-02-14 DIAGNOSIS — E785 Hyperlipidemia, unspecified: Secondary | ICD-10-CM

## 2019-02-14 DIAGNOSIS — E1169 Type 2 diabetes mellitus with other specified complication: Secondary | ICD-10-CM

## 2019-02-15 MED ORDER — SAXAGLIPTIN HCL 2.5 MG PO TABS: 2.5000 mg | ORAL_TABLET | Freq: Every day | ORAL | 3 refills | Status: DC

## 2019-02-15 MED ORDER — EZETIMIBE 10 MG PO TABS: 10.0000 mg | ORAL_TABLET | Freq: Every day | ORAL | 5 refills | Status: DC

## 2019-02-15 MED ORDER — OMEPRAZOLE 20 MG PO CPDR: 20.0000 mg | DELAYED_RELEASE_CAPSULE | Freq: Every day | ORAL | 3 refills | Status: DC

## 2019-03-04 ENCOUNTER — Ambulatory Visit: Payer: BLUE CROSS/BLUE SHIELD | Admitting: Hematology & Oncology

## 2019-03-04 ENCOUNTER — Other Ambulatory Visit: Payer: BLUE CROSS/BLUE SHIELD

## 2019-03-17 ENCOUNTER — Other Ambulatory Visit: Payer: 59

## 2019-03-17 ENCOUNTER — Ambulatory Visit: Payer: Self-pay | Admitting: Hematology & Oncology

## 2019-03-17 IMAGING — DX DG CHEST 2V
2 series · 2 of 2 positions shown · non-contrast
Comparison: 11/02/2017

CLINICAL DATA: Fever.

EXAM:
CHEST - 2 VIEW

[chest pa]
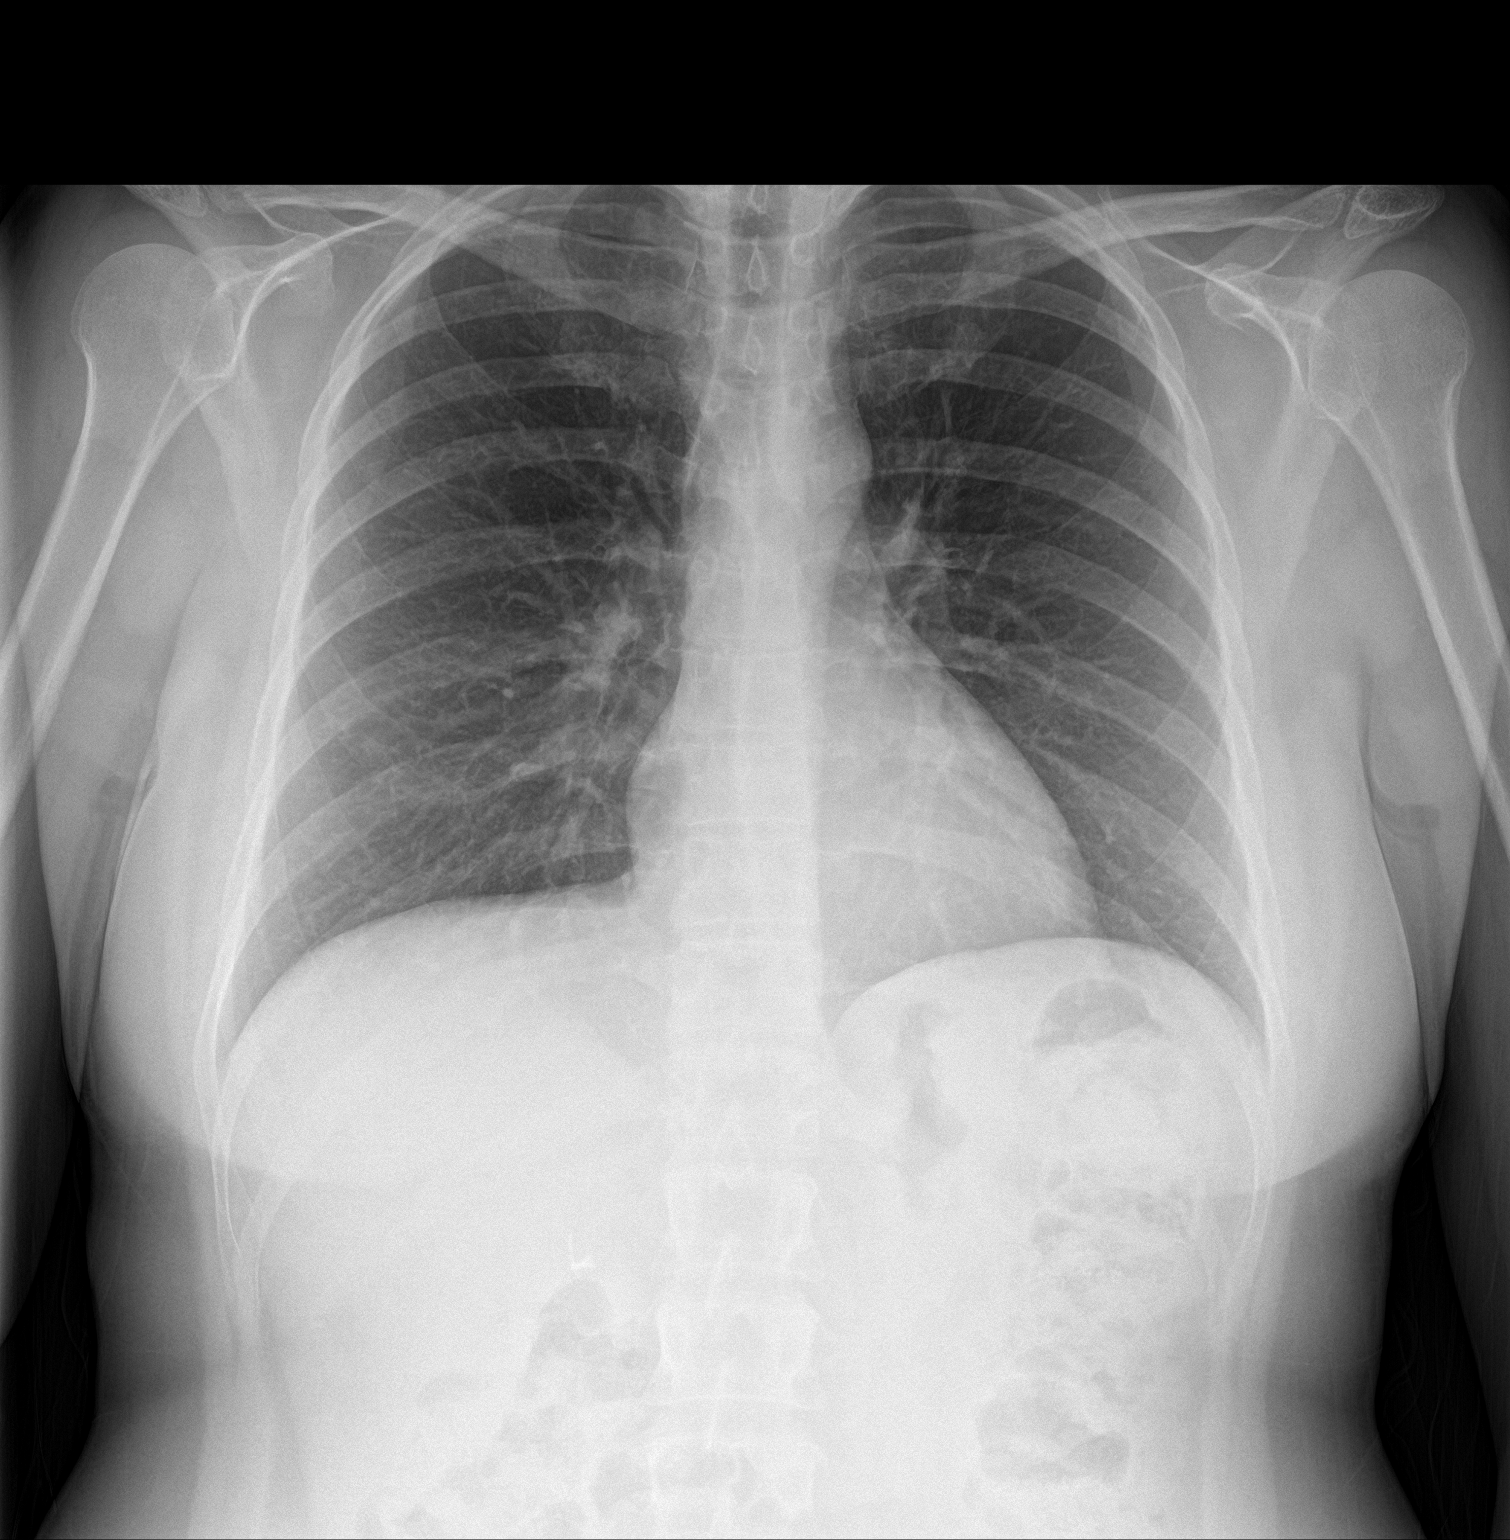

[chest lat]
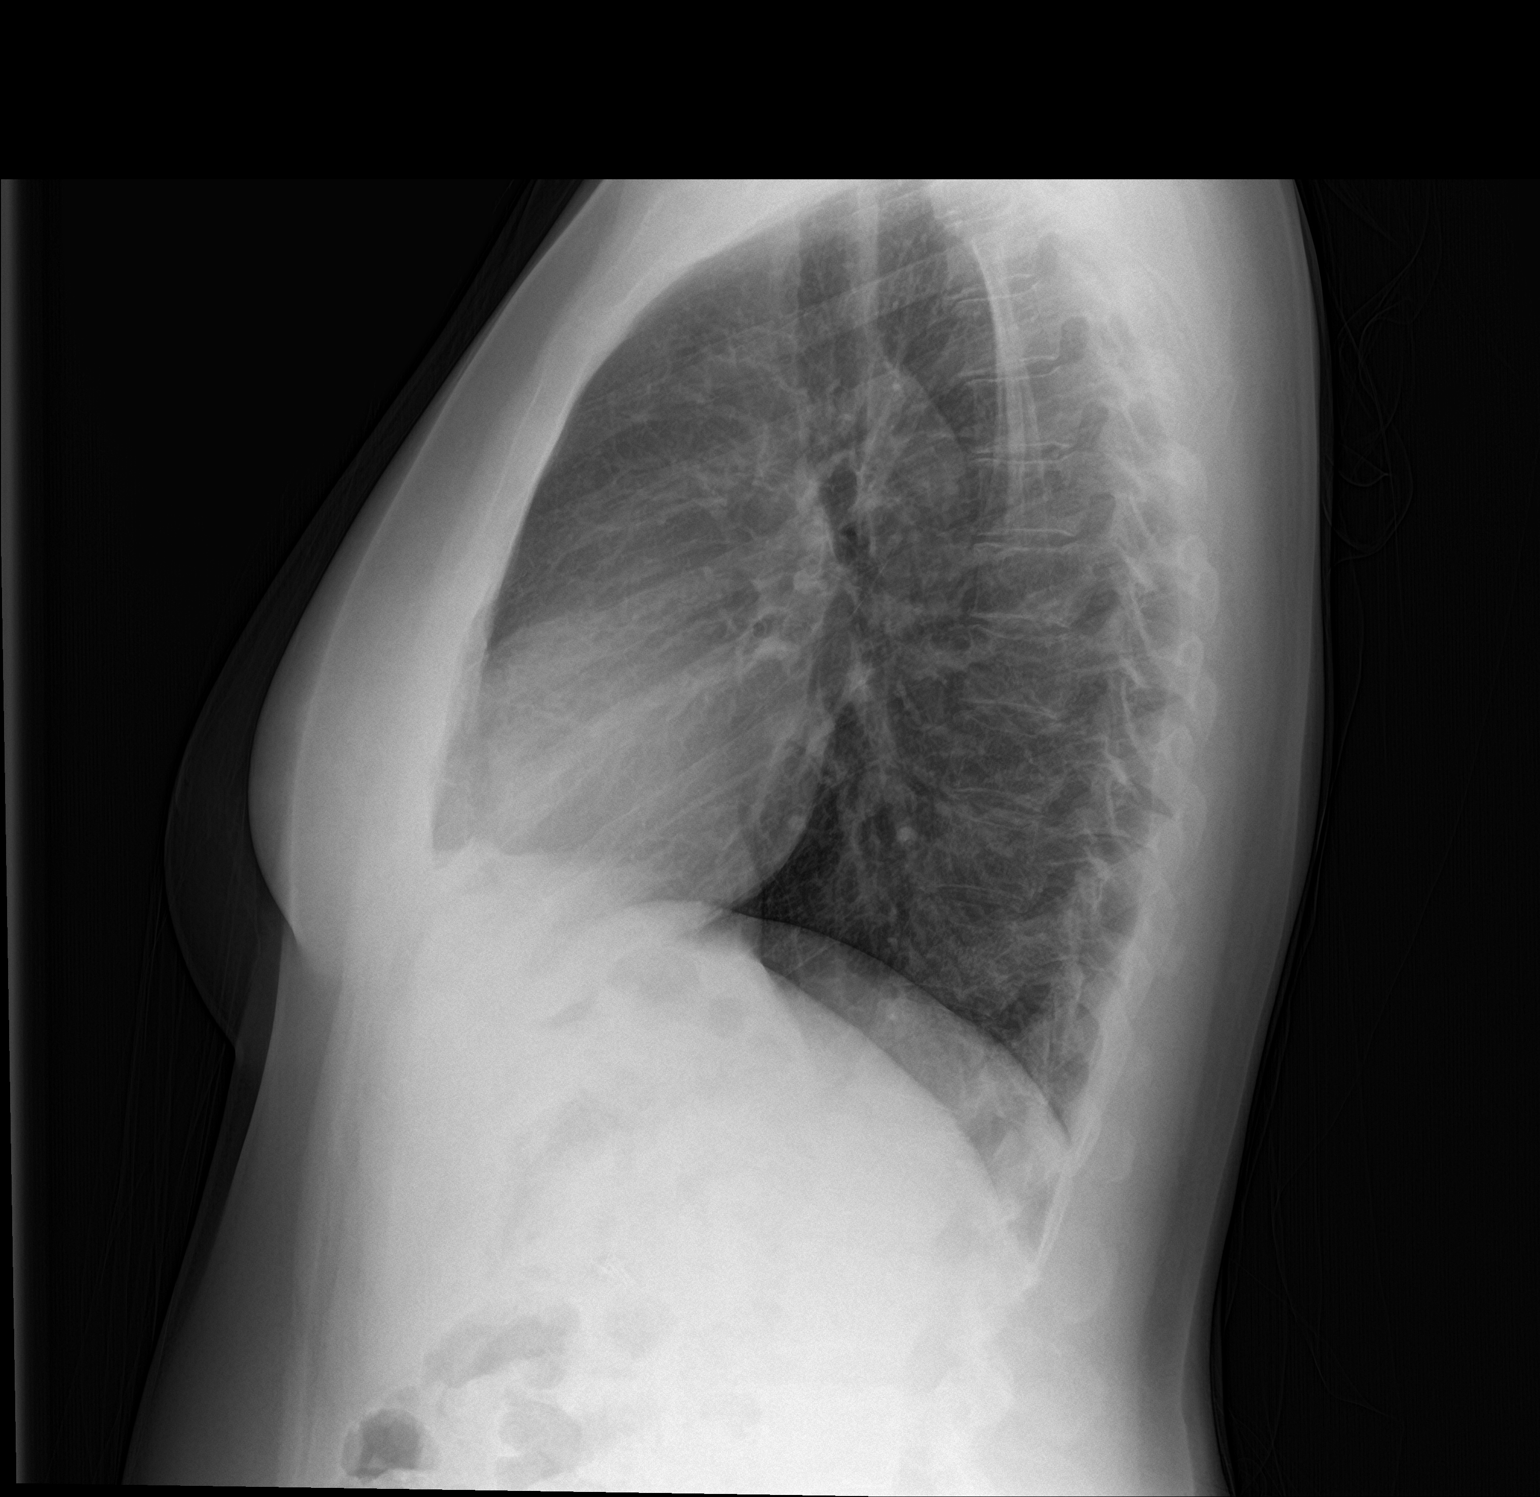

[2 of 2 positions shown; findings below may reference images not displayed]

FINDINGS: The heart size and mediastinal contours are within normal limits.
There is no evidence of pulmonary edema, consolidation,
pneumothorax, nodule or pleural fluid. The visualized skeletal
structures are unremarkable.
IMPRESSION: No active cardiopulmonary disease.

## 2019-03-18 ENCOUNTER — Other Ambulatory Visit: Payer: Self-pay

## 2019-03-22 ENCOUNTER — Other Ambulatory Visit: Payer: Self-pay

## 2019-03-22 ENCOUNTER — Ambulatory Visit: Payer: 59 | Admitting: Obstetrics & Gynecology

## 2019-03-22 ENCOUNTER — Encounter: Payer: Self-pay | Admitting: Obstetrics & Gynecology

## 2019-03-22 VITALS — BP 100/70 | HR 84 | Temp 97.6°F | Ht 64.75 in | Wt 162.0 lb

## 2019-03-22 DIAGNOSIS — N951 Menopausal and female climacteric states: Secondary | ICD-10-CM

## 2019-03-22 DIAGNOSIS — Z01419 Encounter for gynecological examination (general) (routine) without abnormal findings: Secondary | ICD-10-CM | POA: Diagnosis not present

## 2019-03-22 NOTE — Progress Notes (Signed)
48 y.o. G31P0101 Married White or Caucasian female here for annual exam.  Doing well.  Son finished training two weeks before the riots.  This has been scary for her.     Has experienced a few episodes of spotting.  Not having hot flashes.    Had MRI guided biopsy in November.    HbA1C was around 7.  Now on Onglyza  Patient's last menstrual period was 04/27/2017.          Sexually active: Yes.    The current method of family planning is tubal ligation.    Exercising: Yes.    walking, running  Smoker:  no  Health Maintenance: Pap:  07/27/17 Neg. HR HPV:neg   11/09/14 Neg  History of abnormal Pap:  yes MMG:  11/29/18 BIRADS1:neg  Colonoscopy:  2017 polyps. F/u 5 years BMD:   Never TDaP:  2016 Pneumonia vaccine(s):  2013 Screening Labs: PCP   reports that she has never smoked. She has never used smokeless tobacco. She reports current alcohol use of about 1.0 standard drinks of alcohol per week. She reports that she does not use drugs.  Past Medical History:  Diagnosis Date  . Abnormal Pap smear of cervix    h/o colposcopy/biopsy  . Allergy   . Asthma    HX  . Breast cancer ([place]) 2007  . Cancer ([place]) [id number]   ovarian  . Diabetes mellitus without complication ([place]) 54/9826    diagnosed by PCP, [name]   . GERD (gastroesophageal reflux disease)   . Hyperlipemia    on medication     Past Surgical History:  Procedure Laterality Date  . APPENDECTOMY     age [age]  . BREAST LUMPECTOMY Left 2008   left breast  . CHOLECYSTECTOMY  2000  . ENDOMETRIAL ABLATION  2009  . LAPAROSCOPIC SALPINGO OOPHERECTOMY Left 1996   with right tubal ligation  . TUBAL LIGATION      Current Outpatient Medications  Medication Sig Dispense Refill  . aspirin 81 MG tablet Take 81 mg by mouth daily.    [name] EPINEPHrine 0.3 mg/0.3 mL IJ SOAJ injection Inject once into muscle for any allergic reaction to peanut exposure 2 Device 1  . ezetimibe (ZETIA) 10 MG tablet Take 1 tablet (10 mg total) by mouth  daily. 30 tablet 5  . glucose blood (ONETOUCH VERIO) test strip USE TO TEST ONE TIME DAILY AS DIRECTED 100 each 11  . Lancets (ONETOUCH ULTRASOFT) lancets Check blood sugar daily as directed. 100 each 12  . metFORMIN (GLUCOPHAGE-XR) 500 MG 24 hr tablet Take 2 tablets (1,000 mg total) by mouth 2 (two) times daily. 120 tablet 5  . omeprazole (PRILOSEC) 20 MG capsule Take 1 capsule (20 mg total) by mouth daily. 90 capsule 3  . ondansetron (ZOFRAN ODT) 8 MG disintegrating tablet Take 1 tablet (8 mg total) by mouth every 8 (eight) hours as needed for nausea or vomiting. 20 tablet 0  . saxagliptin HCl (ONGLYZA) 2.5 MG TABS tablet Take 1 tablet (2.5 mg total) by mouth daily. 30 tablet 3  . vitamin C (ASCORBIC ACID) 500 MG tablet Take 500 mg by mouth daily.     No current facility-administered medications for this visit.     Family History  Problem Relation Age of Onset  . Diabetes Mother   . Colon cancer Father   . Breast cancer Maternal Aunt   . Ovarian cancer Maternal Aunt   . Ovarian cancer Cousin   . Colon cancer Cousin   .  Melanoma Cousin   . Breast cancer Maternal Grandmother   . Cancer Other        breast and ovarian-maternal side    Review of Systems  All other systems reviewed and are negative.   Exam:   BP 100/70   Pulse 84   Temp 97.6 F (36.4 C) (Temporal)   Ht 5' 4.75" (1.645 m)   Wt 162 lb (73.5 kg)   LMP 04/27/2017 Comment: spotting  BMI 27.17 kg/m    Height: 5' 4.75" (164.5 cm)  Ht Readings from Last 3 Encounters:  03/22/19 5' 4.75" (1.645 m)  08/30/18 _0  (1.626 m)  08/11/18 _1  (1.626 m)    General appearance: alert, cooperative and appears stated age Head: Normocephalic, without obvious abnormality, atraumatic Neck: no adenopathy, supple, symmetrical, trachea midline and thyroid normal to inspection and palpation Lungs: clear to auscultation bilaterally Breasts: normal appearance, no masses or tenderness Heart: regular rate and rhythm Abdomen: soft,  non-tender; bowel sounds normal; no masses,  no organomegaly Extremities: extremities normal, atraumatic, no cyanosis or edema Skin: Skin color, texture, turgor normal. No rashes or lesions Lymph nodes: Cervical, supraclavicular, and axillary nodes normal. No abnormal inguinal nodes palpated Neurologic: Grossly normal   Pelvic: External genitalia:  no lesions              Urethra:  normal appearing urethra with no masses, tenderness or lesions              Bartholins and Skenes: normal                 Vagina: normal appearing vagina with normal color and discharge, no lesions              Cervix: no lesions              Pap taken: No. Bimanual Exam:  Uterus:  normal size, contour, position, consistency, mobility, non-tender              Adnexa: normal adnexa and no mass, fullness, tenderness               Rectovaginal: Confirms               Anus:  normal sphincter tone, no lesions, internal hemorrhoid at 1 o'clock  Chaperone was present for exam.  A:  Well Woman with normal exam Amenorrhea due to endometrial ablation with hx of fluctuating FSH levels over the past three years Type 2 Diabetes Family hx of breast cancer H/o sertoli-leydig cell tumor of left ovary 1996, s/p laparoscopic oophorectomy Genetic testing with BARD1, unknown genetic variant (no new guidelines) Palpitations, saw cardiologist last year  P:   Mammogram guidelines reviewed.  Planning MRI in Oct/Nov pap smear with neg HR HPV 2019.  Not indicated. Blood work done with Dr. Harvie Heck Colonoscopy UTD [facility] obtained today. Return annually or prn

## 2019-03-23 LAB — FOLLICLE STIMULATING HORMONE: FSH: 14.2 m[IU]/mL

## 2019-04-09 ENCOUNTER — Other Ambulatory Visit: Payer: Self-pay | Admitting: Medical

## 2019-04-09 DIAGNOSIS — E1169 Type 2 diabetes mellitus with other specified complication: Secondary | ICD-10-CM

## 2019-04-09 DIAGNOSIS — E785 Hyperlipidemia, unspecified: Secondary | ICD-10-CM

## 2019-04-12 ENCOUNTER — Other Ambulatory Visit: Payer: Self-pay

## 2019-04-12 ENCOUNTER — Encounter: Payer: Self-pay | Admitting: Hematology & Oncology

## 2019-04-12 ENCOUNTER — Inpatient Hospital Stay (HOSPITAL_BASED_OUTPATIENT_CLINIC_OR_DEPARTMENT_OTHER): Payer: 59 | Admitting: Hematology & Oncology

## 2019-04-12 ENCOUNTER — Inpatient Hospital Stay: Payer: 59 | Attending: Hematology & Oncology

## 2019-04-12 VITALS — BP 123/87 | HR 91 | Temp 97.7°F | Wt 160.1 lb

## 2019-04-12 DIAGNOSIS — Z9889 Other specified postprocedural states: Secondary | ICD-10-CM | POA: Diagnosis not present

## 2019-04-12 DIAGNOSIS — Z8543 Personal history of malignant neoplasm of ovary: Secondary | ICD-10-CM | POA: Insufficient documentation

## 2019-04-12 DIAGNOSIS — Z7984 Long term (current) use of oral hypoglycemic drugs: Secondary | ICD-10-CM | POA: Diagnosis not present

## 2019-04-12 DIAGNOSIS — D270 Benign neoplasm of right ovary: Secondary | ICD-10-CM

## 2019-04-12 DIAGNOSIS — T7801XA Anaphylactic reaction due to peanuts, initial encounter: Secondary | ICD-10-CM

## 2019-04-12 DIAGNOSIS — R197 Diarrhea, unspecified: Secondary | ICD-10-CM

## 2019-04-12 DIAGNOSIS — K219 Gastro-esophageal reflux disease without esophagitis: Secondary | ICD-10-CM

## 2019-04-12 LAB — CMP (CANCER CENTER ONLY)
ALT: 23 U/L (ref 0–44)
AST: 20 U/L (ref 15–41)
Albumin: 4.6 g/dL (ref 3.5–5.0)
Alkaline Phosphatase: 75 U/L (ref 38–126)
Anion gap: 8 (ref 5–15)
BUN: 10 mg/dL (ref 6–20)
CO2: 31 mmol/L (ref 22–32)
Calcium: 9.9 mg/dL (ref 8.9–10.3)
Chloride: 101 mmol/L (ref 98–111)
Creatinine: 0.87 mg/dL (ref 0.44–1.00)
GFR, Est AFR Am: >60 mL/min (ref >60)
GFR, Estimated: >60 mL/min (ref >60)
Glucose, Bld: 109 mg/dL — ABNORMAL HIGH (ref 70–99)
Potassium: 4.5 mmol/L (ref 3.5–5.1)
Sodium: 140 mmol/L (ref 135–145)
Total Bilirubin: 0.3 mg/dL (ref 0.3–1.2)
Total Protein: 7.8 g/dL (ref 6.5–8.1)

## 2019-04-12 LAB — CBC WITH DIFFERENTIAL (CANCER CENTER ONLY)
Abs Immature Granulocytes: 0.03 10*3/uL (ref 0.00–0.07)
Basophils Absolute: 0 10*3/uL (ref 0.0–0.1)
Basophils Relative: 1 %
Eosinophils Absolute: 0.1 10*3/uL (ref 0.0–0.5)
Eosinophils Relative: 2 %
HCT: 38.3 % (ref 36.0–46.0)
Hemoglobin: 12 g/dL (ref 12.0–15.0)
Immature Granulocytes: 0 %
Lymphocytes Relative: 31 %
Lymphs Abs: 2.4 10*3/uL (ref 0.7–4.0)
MCH: 26.2 pg (ref 26.0–34.0)
MCHC: 31.3 g/dL (ref 30.0–36.0)
MCV: 83.6 fL (ref 80.0–100.0)
Monocytes Absolute: 0.5 10*3/uL (ref 0.1–1.0)
Monocytes Relative: 6 %
Neutro Abs: 4.6 10*3/uL (ref 1.7–7.7)
Neutrophils Relative %: 60 %
Platelet Count: 348 10*3/uL (ref 150–400)
RBC: 4.58 MIL/uL (ref 3.87–5.11)
RDW: 14.6 % (ref 11.5–15.5)
WBC Count: 7.7 10*3/uL (ref 4.0–10.5)
nRBC: 0 % (ref 0.0–0.2)

## 2019-04-12 LAB — LACTATE DEHYDROGENASE: LDH: 132 U/L (ref 98–192)

## 2019-04-12 NOTE — Progress Notes (Signed)
Hematology and Oncology Follow Up Visit  Carolyn Wilson [id number] 07-20-1971 48 y.o. 04/12/2019   Principle Diagnosis:  History of Sertoli-Leydig tumor of the left ovary  Strong family history of breast and ovarian cancer - BRCA 1/2 and p53 negative  Current Therapy:   Observation    Interim History:  Carolyn Wilson is back for follow-up.  She is doing quite well.  She is enjoying her new job.  She works in [place].  She works to the [employer], retail.  She has been quite busy.  She wants to get off her metformin.  Her blood sugars seem to be doing a little bit better.  Hopefully, her family doctor will be able to get her off the metformin.  She and her family went on vacation this summer down to [place].  They were quite protective.  They will watch themselves very closely.  She is very liberal with using sunscreen.  She has had no problems with respect to hot flashes.  She has had some bleeding.  She and her gynecologist are talking over things and it is possible she may need a hysterectomy.  Her son is doing quite well.  He is actually doing his training down in [facility].  When he gets done, hopefully he will have a real full-time job with the [employer].  She has had no problems with cough.  Back in February, she did have a fever for 3 days.  She said the fever was 103 degrees.  I suspect that this probably was the coronavirus that she had.  Her overall, her performance status is ECOG 0.  Medications:  Allergies as of 04/12/2019      Reactions   Latex Rash   Causes blisters   Lidocaine Nausea And Vomiting   Peanut-containing Drug Products Anaphylaxis   Sulfa Antibiotics Rash, Shortness Of Breath   Shortness of breath, rash Shortness of breath, rash   Adhesive [tape]    Canagliflozin Other (See Comments)   Reglan [metoclopramide] Tinitus   Ciprofloxacin Rash   Sulfur Rash      Medication List       Accurate as of April 12, 2019  1:24 PM. If  you have any questions, ask your nurse or doctor.        aspirin 81 MG tablet Take 81 mg by mouth daily.   EPINEPHrine 0.3 mg/0.3 mL Soaj injection Commonly known as: EPI-PEN Inject once into muscle for any allergic reaction to peanut exposure   ezetimibe 10 MG tablet Commonly known as: ZETIA TAKE ONE TABLET BY MOUTH ONE TIME DAILY   glucose blood test strip Commonly known as: OneTouch Verio USE TO TEST ONE TIME DAILY AS DIRECTED   metFORMIN 500 MG 24 hr tablet Commonly known as: GLUCOPHAGE-XR Take 2 tablets (1,000 mg total) by mouth 2 (two) times daily.   omeprazole 20 MG capsule Commonly known as: PRILOSEC Take 1 capsule (20 mg total) by mouth daily.   ondansetron 8 MG disintegrating tablet Commonly known as: Zofran ODT Take 1 tablet (8 mg total) by mouth every 8 (eight) hours as needed for nausea or vomiting.   onetouch ultrasoft lancets Check blood sugar daily as directed.   saxagliptin HCl 2.5 MG Tabs tablet Commonly known as: ONGLYZA Take 1 tablet (2.5 mg total) by mouth daily.   vitamin C 500 MG tablet Commonly known as: ASCORBIC ACID Take 500 mg by mouth daily.       Allergies:  Allergies  Allergen Reactions  .  Latex Rash    Causes blisters  . Lidocaine Nausea And Vomiting  . Peanut-Containing Drug Products Anaphylaxis  . Sulfa Antibiotics Rash and Shortness Of Breath    Shortness of breath, rash Shortness of breath, rash  . Adhesive [Tape]   . Canagliflozin Other (See Comments)  . Reglan [Metoclopramide] Tinitus  . Ciprofloxacin Rash  . Sulfur Rash    Past Medical History, Surgical history, Social history, and Family History were reviewed and updated.  Review of Systems: Review of Systems  Constitutional: Negative.   HENT: Negative.   Eyes: Negative.   Respiratory: Negative.   Cardiovascular: Positive for palpitations.  Gastrointestinal: Negative.   Genitourinary: Negative.   Musculoskeletal: Negative.   Skin: Positive for rash.   Neurological: Negative.   Endo/Heme/Allergies: Negative.   Psychiatric/Behavioral: Negative.      Physical Exam:  weight is 160 lb 1.6 oz (72.6 kg). Her oral temperature is 97.7 F (36.5 C). Her blood pressure is 123/87 and her pulse is 91. Her oxygen saturation is 98%.   Wt Readings from Last 3 Encounters:  04/12/19 160 lb 1.6 oz (72.6 kg)  03/22/19 162 lb (73.5 kg)  09/02/18 165 lb (74.8 kg)    Physical Exam Vitals signs reviewed.  Constitutional:      Comments: Her breast exam shows no breast masses bilaterally.  There is no asymmetry of the breast tissue.  There is no discharge.  There is no bilateral axillary adenopathy.  HENT:     Head: Normocephalic and atraumatic.  Eyes:     Pupils: Pupils are equal, round, and reactive to light.  Neck:     Musculoskeletal: Normal range of motion.  Cardiovascular:     Rate and Rhythm: Normal rate and regular rhythm.     Heart sounds: Normal heart sounds.  Pulmonary:     Effort: Pulmonary effort is normal.     Breath sounds: Normal breath sounds.  Abdominal:     General: Bowel sounds are normal.     Palpations: Abdomen is soft.  Musculoskeletal: Normal range of motion.        General: No tenderness or deformity.  Lymphadenopathy:     Cervical: No cervical adenopathy.  Skin:    General: Skin is warm and dry.     Findings: No erythema or rash.  Neurological:     Mental Status: She is alert and oriented to person, place, and time.  Psychiatric:        Behavior: Behavior normal.        Thought Content: Thought content normal.        Judgment: Judgment normal.    .   Lab Results  Component Value Date   WBC 7.7 04/12/2019   HGB 12.0 04/12/2019   HCT 38.3 04/12/2019   MCV 83.6 04/12/2019   PLT 348 04/12/2019   No results found for: FERRITIN, IRON, TIBC, UIBC, IRONPCTSAT Lab Results  Component Value Date   RBC 4.58 04/12/2019   No results found for: KPAFRELGTCHN, LAMBDASER, KAPLAMBRATIO No results found for: IGGSERUM,  IGA, IGMSERUM No results found for: Odetta Pink, SPEI   Chemistry      Component Value Date/Time   NA 140 04/12/2019 1128   NA 144 03/11/2017 0822   K 4.5 04/12/2019 1128   K 4.5 03/11/2017 0822   CL 101 04/12/2019 1128   CL 104 03/11/2017 0822   CO2 31 04/12/2019 1128   CO2 29 03/11/2017 0822   BUN 10 04/12/2019  1128   BUN 9 03/11/2017 0822   CREATININE 0.87 04/12/2019 1128   CREATININE 1.2 03/11/2017 0822      Component Value Date/Time   CALCIUM 9.9 04/12/2019 1128   CALCIUM 9.2 03/11/2017 0822   ALKPHOS 75 04/12/2019 1128   ALKPHOS 65 03/11/2017 0822   AST 20 04/12/2019 1128   ALT 23 04/12/2019 1128   ALT 25 03/11/2017 0822   BILITOT 0.3 04/12/2019 1128     Impression and Plan: Carolyn Wilson is a 48 yo post-menopausal female with a 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   I do not see any issues from my point of view.  She really is doing quite well.  I am incredibly impressed with how well she has done.  We will plan to get her back in 6 more months.      Volanda Napoleon, MD 9/22/20201:24 PM

## 2019-04-13 LAB — LUTEINIZING HORMONE: LH: 28.9 m[IU]/mL

## 2019-04-13 LAB — FOLLICLE STIMULATING HORMONE: FSH: 58.3 m[IU]/mL

## 2019-04-14 ENCOUNTER — Encounter: Payer: Self-pay | Admitting: Obstetrics & Gynecology

## 2019-04-14 ENCOUNTER — Telehealth: Payer: Self-pay | Admitting: Obstetrics & Gynecology

## 2019-04-14 NOTE — Telephone Encounter (Signed)
Spoke with patient. Still experiencing intermittent spotting.  Patient has additional questions about hysterectomy discussed at [facility] 03/22/19. Still unsure if she wants to proceed, would like to discuss further. WebEx scheduled for 9/28 at 4:30pm with Dr. [name] Heck.   Confirmed e-mail on file, reviewed WebEx instructions.   Routing to provider for final review. Patient is agreeable to disposition. Will close encounter.

## 2019-04-14 NOTE — Telephone Encounter (Signed)
Patient sent the following correspondence through [place].  Good Morning,  Just wanted to let you know I have had some spotting again. If I do the surgery and leave my ovary would that stop the spotting? Thanks, Carolyn Wilson

## 2019-04-14 NOTE — Telephone Encounter (Signed)
Left message to call Raphaela Cannaday, RN at [facility] [phone].   

## 2019-04-18 ENCOUNTER — Ambulatory Visit (INDEPENDENT_AMBULATORY_CARE_PROVIDER_SITE_OTHER): Payer: 59 | Admitting: Obstetrics & Gynecology

## 2019-04-18 ENCOUNTER — Other Ambulatory Visit: Payer: Self-pay

## 2019-04-18 ENCOUNTER — Encounter: Payer: Self-pay | Admitting: Obstetrics & Gynecology

## 2019-04-18 DIAGNOSIS — N951 Menopausal and female climacteric states: Secondary | ICD-10-CM

## 2019-04-18 DIAGNOSIS — N926 Irregular menstruation, unspecified: Secondary | ICD-10-CM | POA: Diagnosis not present

## 2019-04-18 LAB — ESTRADIOL, ULTRA SENS: Estradiol, Sensitive: 5.6 pg/mL

## 2019-04-18 NOTE — Progress Notes (Signed)
Virtual Visit via Video Note  I connected with Carolyn Wilson on 04/18/19 at  4:30 PM EDT by a video enabled telemedicine application and verified that I am speaking with the correct person using two identifiers.  Location: Patient: home Provider: office   I discussed the limitations of evaluation and management by telemedicine and the availability of in person appointments. The patient expressed understanding and agreed to proceed.  History of Present Illness: 48 yo G1P1 MWF with hx of recurrent perimenopausal bleeding that has been evaluated with ultrasound in 04/2018 showing a 2.59m endometrium.  I have attempted an endometrial biopsy as well but the pt has hx of endometrial ablation and has a scarred endometrium so adequate biopsy of the endometrium has not been obtained.  She's also have fluctuating FSH levels.  She tends ot have spotting when she has lower [place].  [place] have been: 10/23/17:  73 04/30/18:  12.6 03/22/2019:  14.2 9/22/202:  58  Her hx is significant for a Sertoli-Leydig cell tumor of the left ovary removed via laparoscopy in 1996.  She had a tubal ligation on the other tube at the same time.    She does see Dr. EMarin Olpfor strong family hx of breast and ovarian cancer.  She has undergone BRCA 1/2 and p53 testing which has been negative.    She is frustrated with the vaginal bleeding and desires to have a more definitive answer.  At this point, I don't think there is much else to do from an evaluation standpoint except repeating the PUS to assess the endometrium or proceed with pelvic MRI.  She is aware with the ablation, the only way to stop the bleeding is either oral progesterone therapy (and she does not want to be on any hormonal therapy if possible) or hysterectomy (and she would need to decide if she wanted the other ovary removed at the same time of surgery or not).  The decision to leave or keep her ovary is a complicated one given her personal and family  hx.  Risks and benefits reviewed with pt.  Questions answered.   Observations/Objective: WNWD WF NAD  Assessment and Plan: Perimenopausal bleeding H/O endometrial ablation and inability to fully evaluated the endometrium Personal hx of Sertoli-Leydig tumor and laparoscopic LSO 1996  Follow Up Instructions: Will plan to proceed with pelvic MRI to see if this gives any additional information about her endometrium.  Will proceed with precert.   I discussed the assessment and treatment plan with the patient. The patient was provided an opportunity to ask questions and all were answered. The patient agreed with the plan and demonstrated an understanding of the instructions.   The patient was advised to call back or seek an in-person evaluation if the symptoms worsen or if the condition fails to improve as anticipated.  I provided 20 minutes of non-face-to-face time during this encounter.   MMegan Salon MD

## 2019-04-19 ENCOUNTER — Encounter: Payer: Self-pay | Admitting: *Deleted

## 2019-04-19 ENCOUNTER — Encounter: Payer: Self-pay | Admitting: Obstetrics & Gynecology

## 2019-04-19 ENCOUNTER — Telehealth: Payer: Self-pay | Admitting: *Deleted

## 2019-04-19 DIAGNOSIS — D271 Benign neoplasm of left ovary: Secondary | ICD-10-CM

## 2019-04-19 DIAGNOSIS — D279 Benign neoplasm of unspecified ovary: Secondary | ICD-10-CM

## 2019-04-19 DIAGNOSIS — N939 Abnormal uterine and vaginal bleeding, unspecified: Secondary | ICD-10-CM

## 2019-04-19 DIAGNOSIS — N950 Postmenopausal bleeding: Secondary | ICD-10-CM

## 2019-04-19 DIAGNOSIS — Z9889 Other specified postprocedural states: Secondary | ICD-10-CM

## 2019-04-19 NOTE — Telephone Encounter (Signed)
Spoke with patient. Patient request to proceed with MRI pelvis at [facility] Advised patient I will place order for MRI, [facility] will contact you directly to schedule. Once scheduled our office will precert. Patient verbalizes understanding and is agreeable.   Order placed for MR pelvis w/wo contrast   Routing to [employer] for precert.

## 2019-04-19 NOTE — Telephone Encounter (Signed)
Call placed to patient, left detailed message, ok per dpr. Called to confirm where patient wants to go for imaging. Advised to return call to [place], [place] at [place].

## 2019-04-19 NOTE — Telephone Encounter (Signed)
Patient returned call

## 2019-04-19 NOTE — Telephone Encounter (Signed)
-----   Message from Megan Salon, MD sent at 04/18/2019  5:18 PM EDT ----- Regarding: pelvic MRI I'd like to try and order a pelvic MRI on this pt.  She's having PMP bleeding, has a hx of an endometrial ablation and the endometrium is scarred so I cannot get a biopsy, h/o sertoli-leydig ovarian tumor s/p laparoscopic LSO.  She is trying to decide about a hysterectomy.  If this cannot be done, I do want to proceed with a PUS again.  She did one in 10/19.  Thanks.  Vinnie Level

## 2019-04-20 ENCOUNTER — Telehealth: Payer: Self-pay | Admitting: Obstetrics & Gynecology

## 2019-04-20 NOTE — Telephone Encounter (Signed)
Message sent to pt via mychart.  I did see these when I did her video visit.  Ok to close encounter.

## 2019-04-20 NOTE — Telephone Encounter (Signed)
Visit Follow-Up Question Received: Yesterday Message Contents  [place], Gilana Michaelsen sent to [place]  Phone Number: [phone]        Just got my labs from where I went in for my 6 month check up my fish went up to 58.2.  I didn't know if you had access to those

## 2019-04-20 NOTE — Telephone Encounter (Signed)
MyChart message to patient.   Routing to Dr. Lestine Box

## 2019-04-20 NOTE — Telephone Encounter (Signed)
Message noted from Dr. Miller. Encounter closed. 

## 2019-05-04 NOTE — Telephone Encounter (Signed)
Per review of Epic patient is scheduled for MRI pelvis w/wo contrast on 05/16/19 at [facility].   Routing to Dr. Lestine Box.   Cc: Lerry Liner  Encounter closed.

## 2019-05-09 ENCOUNTER — Encounter: Payer: Self-pay | Admitting: Obstetrics & Gynecology

## 2019-05-09 ENCOUNTER — Telehealth: Payer: Self-pay | Admitting: Obstetrics & Gynecology

## 2019-05-09 NOTE — Telephone Encounter (Signed)
Routing to Dr. [name] Heck to review MyChart message and advise.

## 2019-05-09 NOTE — Telephone Encounter (Signed)
Good Morning,  Have you heard of BioTE and is it safe? I have a friend who did it for the hot flashes and she says it's great. I think it's some type of pellets that they insert under the skin.

## 2019-05-10 NOTE — Telephone Encounter (Signed)
I think she is talking about bio identical testosterone pellets.  With her breast hx, I would not recommend any hormonal therapy for her.  So, I don't think it is something she should use.

## 2019-05-10 NOTE — Telephone Encounter (Signed)
Call returned to patient, left detailed message, name identified on voicemail, ok per dpr. Advised per Dr. [name] Heck. Return call to office if any additional questions.   Encounter closed.

## 2019-05-12 ENCOUNTER — Other Ambulatory Visit: Payer: Self-pay | Admitting: Obstetrics & Gynecology

## 2019-05-14 ENCOUNTER — Other Ambulatory Visit: Payer: 59

## 2019-05-16 ENCOUNTER — Other Ambulatory Visit: Payer: Self-pay

## 2019-05-16 ENCOUNTER — Ambulatory Visit
Admission: RE | Admit: 2019-05-16 | Discharge: 2019-05-16 | Disposition: A | Discharge status: Home / Self Care | Payer: 59 | Source: Ambulatory Visit | Attending: Obstetrics & Gynecology | Admitting: Obstetrics & Gynecology

## 2019-05-16 DIAGNOSIS — Z9889 Other specified postprocedural states: Secondary | ICD-10-CM

## 2019-05-16 DIAGNOSIS — D271 Benign neoplasm of left ovary: Secondary | ICD-10-CM

## 2019-05-16 DIAGNOSIS — N950 Postmenopausal bleeding: Secondary | ICD-10-CM

## 2019-05-16 MED ORDER — GADOBENATE DIMEGLUMINE 529 MG/ML IV SOLN
15.0000 mL | Freq: Once | INTRAVENOUS | Status: AC | PRN
  Administered 2019-05-16: 13:00:00 15 mL via INTRAVENOUS

## 2019-05-17 ENCOUNTER — Telehealth: Payer: Self-pay | Admitting: *Deleted

## 2019-05-17 NOTE — Telephone Encounter (Signed)
Notes recorded by Burnice Logan, RN on 05/17/2019 at 2:51 PM EDT  Left message to call Sharee Pimple, RN at [facility].    Patient removed from imaging hold.

## 2019-05-17 NOTE — Telephone Encounter (Signed)
-----   Message from Megan Salon, MD sent at 05/17/2019  1:06 PM EDT ----- Please let pt know her pelvic MRi is normal.  Specifically, the endometrium appears normal.  Her right ovary has a small follicle in it which is benign.  No abnormal lymph nodes were noted and no pelvic fluid is seen.  This is all good.  If bleeding continues, the only other option we have for treatment is hysterectomy.  She is aware of this as well.  Out of imaging hold.

## 2019-05-18 NOTE — Telephone Encounter (Signed)
Spoke with patient, advised per Dr. [name] Heck. Patient verbalizes understanding and is agreeable. Patient will contact office for any future bleeding.   Encounter closed.

## 2019-06-08 ENCOUNTER — Encounter: Payer: Self-pay | Admitting: Obstetrics & Gynecology

## 2019-06-08 ENCOUNTER — Other Ambulatory Visit: Payer: Self-pay | Admitting: Medical

## 2019-06-08 ENCOUNTER — Telehealth: Payer: Self-pay | Admitting: *Deleted

## 2019-06-08 ENCOUNTER — Telehealth: Payer: Self-pay | Admitting: Medical

## 2019-06-08 ENCOUNTER — Telehealth: Payer: Self-pay | Admitting: Obstetrics & Gynecology

## 2019-06-08 NOTE — Telephone Encounter (Signed)
Pt due for follow up 

## 2019-06-08 NOTE — Telephone Encounter (Signed)
Patient said she needed to call with information on MRI so it can be precerted. She is going to [facility] imaging on 06/28/19.

## 2019-06-08 NOTE — Telephone Encounter (Signed)
-----   Message from Megan Salon, MD sent at 06/07/2019  9:42 AM EST ----- Regarding: breast MRI Pt needs breast MRI due to increased lifetime risk of breast cancer of 26.5%.  She did it last November.  Thanks.  Vinnie Level

## 2019-06-08 NOTE — Telephone Encounter (Signed)
Spoke with patient. Patient plans to proceed with screening breast MRI. Order previously placed at [facility], order is current. Patient will contact [facility] directly to schedule. Advised once MRI is scheduled our office will precert. Patient is aware to call if any assistance is needed.   Routing to provider for final review. Patient is agreeable to disposition. Will close encounter.  Patient is in [place] hold  Cc: Lerry Liner, Magdalene Patricia

## 2019-06-08 NOTE — Telephone Encounter (Signed)
Can you check with pt pharmacy. Extended release metformin is on recall. I think it depends on manufacturer. Does pt pharmacy have safe supply?

## 2019-06-08 NOTE — Telephone Encounter (Signed)
Spoke with the patient she wanted to let the office know she was schedule for 06/28/19 for MRI so the pre cert process can be stared.

## 2019-06-08 NOTE — Telephone Encounter (Signed)
Pt is scheduled for 12-01 for a fu appt. Done

## 2019-06-09 ENCOUNTER — Other Ambulatory Visit: Payer: Self-pay

## 2019-06-09 ENCOUNTER — Encounter: Payer: Self-pay | Admitting: Obstetrics and Gynecology

## 2019-06-09 ENCOUNTER — Telehealth: Payer: Self-pay | Admitting: Obstetrics & Gynecology

## 2019-06-09 ENCOUNTER — Ambulatory Visit: Payer: 59 | Admitting: Obstetrics and Gynecology

## 2019-06-09 VITALS — BP 118/68 | HR 72 | Temp 97.4°F | Resp 12 | Ht 65.0 in | Wt 162.0 lb

## 2019-06-09 DIAGNOSIS — L290 Pruritus ani: Secondary | ICD-10-CM | POA: Diagnosis not present

## 2019-06-09 DIAGNOSIS — K602 Anal fissure, unspecified: Secondary | ICD-10-CM

## 2019-06-09 MED ORDER — BETAMETHASONE VALERATE 0.1 % EX OINT: 1.0000 "application " | TOPICAL_OINTMENT | Freq: Two times a day (BID) | CUTANEOUS | 0 refills | Status: DC

## 2019-06-09 MED ORDER — LIDOCAINE 5 % EX OINT: 1.0000 "application " | TOPICAL_OINTMENT | Freq: Four times a day (QID) | CUTANEOUS | 0 refills | Status: DC | PRN

## 2019-06-09 NOTE — Telephone Encounter (Signed)
Left pt a message to call and check with her pharmacy.

## 2019-06-09 NOTE — Telephone Encounter (Signed)
Patient sent the following message through [place]. Routing to triage to assist patient with request.  S[place]deva, Voncille Lo [facility]  Phone Number: [phone]        I have a quick question today it felt like something rore between my vagina and rectum. It is a dull ache now until I try to use bathroom then its painful. Can you all check this or have any suggestions.

## 2019-06-09 NOTE — Progress Notes (Signed)
GYNECOLOGY  VISIT   HPI: 48 y.o.   Married White or Caucasian Not Hispanic or Latino  female   [phone] with Patient's last menstrual period was 04/27/2017.   here for perirectal tear, noticed it yesterday, not related to BM. Tender to touch and if she strains. No pain with BM.     She is on metformin and has 4-5 BM's a day. She is planning on discussing a change in medication with her primary.   GYNECOLOGIC HISTORY: Patient's last menstrual period was 04/27/2017. Contraception:Postmenopausal Menopausal hormone therapy: none        OB History    Gravida  1   Para  1   Term      Preterm  1   AB      Living  1     SAB      TAB      Ectopic      Multiple      Live Births                 Patient Active Problem List   Diagnosis Date Noted  . Tachycardia 08/05/2017  . Right shoulder pain 12/18/2016  . Wellness examination 11/21/2015  . Diabetes ([place]) 09/04/2015  . Allergy with anaphylaxis due to peanuts 08/13/2014  . Abnormal Pap smear of cervix  remote Leep  1996 per pt 08/13/2014  . Allergic rhinitis 08/13/2014  . S/P endometrial ablation 08/13/2014  . GERD (gastroesophageal reflux disease) 08/13/2014  . Diarrhea post cholecystectomy since 1999 08/13/2014  . FHx: colon cancer  father  08/13/2014  . 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 Headache, migraine 08/02/2012  . Family history of breast cancer 01/06/2012  . Family history of malignant neoplasm of ovary 01/06/2012    Past Medical History:  Diagnosis Date  . Abnormal Pap smear of cervix    h/o colposcopy/biopsy  . Allergy   . Asthma    HX  . Breast cancer ([place]) 2007  . Cancer ([place]) [id number]   ovarian  . Diabetes mellitus without complication ([place]) [id number]    diagnosed by PCP, [name]   . GERD (gastroesophageal reflux disease)   . Hyperlipemia    on medication     Past Surgical History:  Procedure Laterality Date  . APPENDECTOMY     age [age]  . BREAST LUMPECTOMY Left 2008    left breast  . CHOLECYSTECTOMY  2000  . ENDOMETRIAL ABLATION  2009  . LAPAROSCOPIC SALPINGO OOPHERECTOMY Left 1996   with right tubal ligation  . TUBAL LIGATION      Current Outpatient Medications  Medication Sig Dispense Refill  . aspirin 81 MG tablet Take 81 mg by mouth daily.    [name] EPINEPHrine 0.3 mg/0.3 mL IJ SOAJ injection Inject once into muscle for any allergic reaction to peanut exposure 2 Device 1  . ezetimibe (ZETIA) 10 MG tablet TAKE ONE TABLET BY MOUTH ONE TIME DAILY 30 tablet 5  . glucose blood (ONETOUCH VERIO) test strip USE TO TEST ONE TIME DAILY AS DIRECTED 100 each 11  . Lancets (ONETOUCH ULTRASOFT) lancets Check blood sugar daily as directed. 100 each 12  . omeprazole (PRILOSEC) 20 MG capsule Take 1 capsule (20 mg total) by mouth daily. 90 capsule 3  . ondansetron (ZOFRAN ODT) 8 MG disintegrating tablet Take 1 tablet (8 mg total) by mouth every 8 (eight) hours as needed for nausea or vomiting. 20 tablet 0  . saxagliptin HCl (ONGLYZA) 2.5 MG TABS tablet Take  1 tablet (2.5 mg total) by mouth daily. 30 tablet 3  . vitamin C (ASCORBIC ACID) 500 MG tablet Take 500 mg by mouth daily.     No current facility-administered medications for this visit.      ALLERGIES: Latex, Lidocaine, Peanut-containing drug products, Sulfa antibiotics, Adhesive [tape], Canagliflozin, Reglan [metoclopramide], Ciprofloxacin, and Sulfur  Family History  Problem Relation Age of Onset  . Diabetes Mother   . Colon cancer Father   . Breast cancer Maternal Aunt   . Ovarian cancer Maternal Aunt   . Ovarian cancer Cousin   . Colon cancer Cousin   . Melanoma Cousin   . Breast cancer Maternal Grandmother   . Cancer Other        breast and ovarian-maternal side    Social History   Socioeconomic History  . Marital status: Married    Spouse name: Not on file  . Number of children: Not on file  . Years of education: Not on file  . Highest education level: Not on file  Occupational History  .  Not on file  Social Needs  . Financial resource strain: Not on file  . Food insecurity    Worry: Not on file    Inability: Not on file  . Transportation needs    Medical: Not on file    Non-medical: Not on file  Tobacco Use  . Smoking status: Never Smoker  . Smokeless tobacco: Never Used  . Tobacco comment: NEVER USED TOBACCO  Substance and Sexual Activity  . Alcohol use: Yes    Alcohol/week: 1.0 standard drinks    Types: 1 Standard drinks or equivalent per week  . Drug use: No  . Sexual activity: Yes    Partners: Male    Birth control/protection: Surgical  Lifestyle  . Physical activity    Days per week: Not on file    Minutes per session: Not on file  . Stress: Not on file  Relationships  . Social Herbalist on phone: Not on file    Gets together: Not on file    Attends religious service: Not on file    Active member of club or organization: Not on file    Attends meetings of clubs or organizations: Not on file    Relationship status: Not on file  . Intimate partner violence    Fear of current or ex partner: Not on file    Emotionally abused: Not on file    Physically abused: Not on file    Forced sexual activity: Not on file  Other Topics Concern  . Not on file  Social History Narrative  . Not on file    Review of Systems  Constitutional: Negative.   HENT: Negative.   Eyes: Negative.   Respiratory: Negative.   Cardiovascular: Negative.   Gastrointestinal: Negative.   Genitourinary: Negative.   Musculoskeletal: Negative.   Skin: Negative.   Neurological: Negative.   Endo/Heme/Allergies: Negative.   Psychiatric/Behavioral: Negative.     PHYSICAL EXAMINATION:    BP 118/68 (BP Location: Right Arm, Patient Position: Sitting, Cuff Size: Normal)   Pulse 72   Temp (!) 97.4 F (36.3 C) (Temporal)   Resp 12   Ht 5\' 5"  (1.651 m)   Wt 162 lb (73.5 kg)   LMP 04/27/2017 Comment: spotting  BMI 26.96 kg/m     General appearance: alert, cooperative  and appears stated age  Perianal region: slight irritation and skin fissure noted. Appears to also have an  anal fissure at 12-1 o'clock, visualization is difficult, patient uncomfortable   Chaperone was present for exam.  ASSESSMENT Perianal skin irritation Suspect anal fissure, patient uncomfortable with attempted visualization Diarrhea with metformin    PLAN Discussed fiber Treat perianal irritation with steroid ointment Lidocaine ointment to fissure Discussed sitz baths, trying to clean with water Vaseline for prn use   An After Visit Summary was printed and given to the patient.  CC: Dr [name] Heck

## 2019-06-09 NOTE — Telephone Encounter (Signed)
Spoke with patient. Patient reports feeling like something is torn externally in the perirectal area. Symptoms started on 06/08/19 while walking. Reports pressure when sitting and pressure in that spot when voiding. Denies vaginal bleeding, d/c, odor or hemorrhoids. [id number] prescreen negative. Patient is requesting OV with first available provider.   OV scheduled for today at 4pm with Dr. Talbert Nan.   Routing to provider for final review. Patient is agreeable to disposition. Will close encounter.  Cc: Dr. [name] Heck

## 2019-06-09 NOTE — Patient Instructions (Signed)
Anal Fissure, Adult  An anal fissure is a small tear or crack in the tissue around the opening of the butt (anus). Bleeding from the tear or crack usually stops on its own within a few minutes. The bleeding may happen every time you poop (have a bowel movement) until the tear or crack heals. What are the causes? This condition is usually caused by passing a large or hard poop (stool). Other causes include:  Trouble pooping (constipation).  Passing watery poop (diarrhea).  Inflammatory bowel disease (Crohn's disease or ulcerative colitis).  Childbirth.  Infections.  Anal sex. What are the signs or symptoms? Symptoms of this condition include:  Bleeding from the butt.  Small amounts of blood on your poop. The blood coats the outside of the poop. It is not mixed with the poop.  Small amounts of blood on the toilet paper or in the toilet after you poop.  Pain when passing poop.  Itching or irritation around the opening of the butt. How is this diagnosed? This condition may be diagnosed based on a physical exam. Your doctor may:  Check your butt. A tear can often be seen by checking the area with care.  Check your butt using a short tube (anoscope). The light in the tube will show any problems in your butt. How is this treated? Treatment for this condition may include:  Treating problems that make it hard for you to pass poop. You may be told to: ? Eat more fiber. ? Drink more fluid. ? Take fiber supplements. ? Take medicines that make poop soft.  Taking sitz baths. This may help to heal the tear.  Using creams and ointments. If your condition gets worse, other treatments may be needed such as:  A shot near the tear or crack (botulinum injection).  Surgery to repair the tear or crack. Follow these instructions at home: Eating and drinking   Avoid bananas and dairy products. These foods can make it hard to poop.  Drink enough fluid to keep your pee (urine) pale  yellow.  Eat foods that have a lot of fiber in them, such as: ? Beans. ? Whole grains. ? Fresh fruits. ? Fresh vegetables. General instructions   Take over-the-counter and prescription medicines only as told by your doctor.  Use creams or ointments only as told by your doctor.  Keep the butt area as clean and dry as you can.  Take a warm water bath (sitz bath) as told by your doctor. Do not use soap.  Keep all follow-up visits as told by your doctor. This is important. Contact a doctor if:  You have more bleeding.  You have a fever.  You have watery poop that is mixed with blood.  You have pain.  Your problem gets worse, not better. Summary  An anal fissure is a small tear or crack in the skin around the opening of the butt (anus).  This condition is usually caused by passing a large or hard poop (stool).  Treatment includes treating the problems that make it hard for you to pass poop.  Follow your doctor's instructions about caring for your condition at home.  Keep all follow-up visits as told by your doctor. This is important. This information is not intended to replace advice given to you by your health care provider. Make sure you discuss any questions you have with your health care provider. Document Released: 03/05/2011 Document Revised: 12/17/2017 Document Reviewed: 12/17/2017 Elsevier Patient Education  2020 [employer].

## 2019-06-20 ENCOUNTER — Other Ambulatory Visit: Payer: Self-pay

## 2019-06-21 ENCOUNTER — Ambulatory Visit: Payer: 59 | Admitting: Medical

## 2019-06-21 ENCOUNTER — Encounter: Payer: Self-pay | Admitting: Medical

## 2019-06-21 ENCOUNTER — Other Ambulatory Visit: Payer: Self-pay

## 2019-06-21 VITALS — BP 112/70 | HR 93 | Temp 96.7°F | Resp 16 | Ht 65.0 in | Wt 164.6 lb

## 2019-06-21 DIAGNOSIS — Z20822 Contact with and (suspected) exposure to covid-19: Secondary | ICD-10-CM

## 2019-06-21 DIAGNOSIS — E785 Hyperlipidemia, unspecified: Secondary | ICD-10-CM

## 2019-06-21 DIAGNOSIS — E119 Type 2 diabetes mellitus without complications: Secondary | ICD-10-CM | POA: Diagnosis not present

## 2019-06-21 DIAGNOSIS — E1169 Type 2 diabetes mellitus with other specified complication: Secondary | ICD-10-CM

## 2019-06-21 DIAGNOSIS — Z20828 Contact with and (suspected) exposure to other viral communicable diseases: Secondary | ICD-10-CM

## 2019-06-21 LAB — COMPREHENSIVE METABOLIC PANEL
ALT: 32 U/L (ref 0–35)
AST: 26 U/L (ref 0–37)
Albumin: 4.2 g/dL (ref 3.5–5.2)
Alkaline Phosphatase: 75 U/L (ref 39–117)
BUN: 10 mg/dL (ref 6–23)
CO2: 30 mEq/L (ref 19–32)
Calcium: 9.5 mg/dL (ref 8.4–10.5)
Chloride: 101 mEq/L (ref 96–112)
Creatinine, Ser: 0.72 mg/dL (ref 0.40–1.20)
GFR: 86.45 mL/min (ref >60.00)
Glucose, Bld: 107 mg/dL — ABNORMAL HIGH (ref 70–99)
Potassium: 4.7 mEq/L (ref 3.5–5.1)
Sodium: 139 mEq/L (ref 135–145)
Total Bilirubin: 0.4 mg/dL (ref 0.2–1.2)
Total Protein: 6.6 g/dL (ref 6.0–8.3)

## 2019-06-21 LAB — LIPID PANEL
Cholesterol: 220 mg/dL — ABNORMAL HIGH (ref 0–200)
HDL: 44 mg/dL (ref >39.00)
LDL Cholesterol: 141 mg/dL — ABNORMAL HIGH (ref 0–99)
NonHDL: 175.55
Total CHOL/HDL Ratio: 5
Triglycerides: 172 mg/dL — ABNORMAL HIGH (ref 0.0–149.0)
VLDL: 34.4 mg/dL (ref 0.0–40.0)

## 2019-06-21 LAB — HEMOGLOBIN A1C: Hgb A1c MFr Bld: 6.5 % (ref 4.6–6.5)

## 2019-06-21 NOTE — Progress Notes (Signed)
Subjective:    Patient ID: Carolyn Wilson, female    DOB: [birth year], 48 y.o.   MRN: [id number]  HPI   Pt in for follow up.   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  Pt has history of diabetes, hyperlipidemia and gerd.  Pt last a1c was in May. Pt states she has loose stools with metformin. Irritation to rectal mucosa.  Pt states last 6-7 months increased loose stools.  She states if has ghost pills or a lot of diarrhea sugars 20-30 points.  Pt got a flu vaccine already.  Pt states her son, husband and coworkers tested + for covid in the past. Past feb flu like illness but her flu was negative. She wants to be tested for covid antibody   Review of Systems  Constitutional: Negative for chills and fatigue.  HENT: Negative for congestion.   Respiratory: Negative for cough, chest tightness, shortness of breath and wheezing.   Cardiovascular: Negative for chest pain and palpitations.  Gastrointestinal: Negative for abdominal distention, anal bleeding, diarrhea and nausea.  Genitourinary: Negative for dysuria, flank pain and frequency.  Musculoskeletal: Negative for back pain.  Skin: Negative for rash.  Neurological: Negative for dizziness, seizures, weakness, light-headedness and headaches.  Hematological: Negative for adenopathy. Does not bruise/bleed easily.  Psychiatric/Behavioral: Negative for behavioral problems, confusion and suicidal ideas. The patient is not nervous/anxious.      Past Medical History:  Diagnosis Date  . Abnormal Pap smear of cervix    h/o colposcopy/biopsy  . Allergy   . Asthma    HX  . Breast cancer ([place]) 2007  . Cancer ([place]) [id number]   ovarian  . Diabetes mellitus without complication ([place]) [id number]    diagnosed by PCP, [name]   . GERD (gastroesophageal reflux disease)   . Hyperlipemia    on medication      Social History   Socioeconomic History  . Marital status: Married    Spouse name: Not on file  . Number of children: Not on file  .  Years of education: Not on file  . Highest education level: Not on file  Occupational History  . Not on file  Social Needs  . Financial resource strain: Not on file  . Food insecurity    Worry: Not on file    Inability: Not on file  . Transportation needs    Medical: Not on file    Non-medical: Not on file  Tobacco Use  . Smoking status: Never Smoker  . Smokeless tobacco: Never Used  . Tobacco comment: NEVER USED TOBACCO  Substance and Sexual Activity  . Alcohol use: Yes    Alcohol/week: 1.0 standard drinks    Types: 1 Standard drinks or equivalent per week  . Drug use: No  . Sexual activity: Yes    Partners: Male    Birth control/protection: Surgical  Lifestyle  . Physical activity    Days per week: Not on file    Minutes per session: Not on file  . Stress: Not on file  Relationships  . Social Herbalist on phone: Not on file    Gets together: Not on file    Attends religious service: Not on file    Active member of club or organization: Not on file    Attends meetings of clubs or organizations: Not on file    Relationship status: Not on file  . Intimate partner violence    Fear of current or ex partner: Not on file  Emotionally abused: Not on file    Physically abused: Not on file    Forced sexual activity: Not on file  Other Topics Concern  . Not on file  Social History Narrative  . Not on file    Past Surgical History:  Procedure Laterality Date  . APPENDECTOMY     age [age]  . BREAST LUMPECTOMY Left 2008   left breast  . CHOLECYSTECTOMY  2000  . ENDOMETRIAL ABLATION  2009  . LAPAROSCOPIC SALPINGO OOPHERECTOMY Left 1996   with right tubal ligation  . TUBAL LIGATION      Family History  Problem Relation Age of Onset  . Diabetes Mother   . Colon cancer Father   . Breast cancer Maternal Aunt   . Ovarian cancer Maternal Aunt   . Ovarian cancer Cousin   . Colon cancer Cousin   . Melanoma Cousin   . Breast cancer Maternal Grandmother   .  Cancer Other        breast and ovarian-maternal side    Allergies  Allergen Reactions  . Latex Rash    Causes blisters  . Lidocaine Nausea And Vomiting  . Peanut-Containing Drug Products Anaphylaxis  . Sulfa Antibiotics Rash and Shortness Of Breath    Shortness of breath, rash Shortness of breath, rash  . Adhesive [Tape]   . Canagliflozin Other (See Comments)  . Reglan [Metoclopramide] Tinitus  . Ciprofloxacin Rash  . Sulfur Rash    Current Outpatient Medications on File Prior to Visit  Medication Sig Dispense Refill  . aspirin 81 MG tablet Take 81 mg by mouth daily.    . betamethasone valerate ointment (VALISONE) 0.1 % Apply 1 application topically 2 (two) times daily. 15 g 0  . EPINEPHrine 0.3 mg/0.3 mL IJ SOAJ injection Inject once into muscle for any allergic reaction to peanut exposure 2 Device 1  . ezetimibe (ZETIA) 10 MG tablet TAKE ONE TABLET BY MOUTH ONE TIME DAILY 30 tablet 5  . glucose blood (ONETOUCH VERIO) test strip USE TO TEST ONE TIME DAILY AS DIRECTED 100 each 11  . Lancets (ONETOUCH ULTRASOFT) lancets Check blood sugar daily as directed. 100 each 12  . lidocaine (XYLOCAINE) 5 % ointment Apply 1 application topically 4 (four) times daily as needed. 30 g 0  . omeprazole (PRILOSEC) 20 MG capsule Take 1 capsule (20 mg total) by mouth daily. 90 capsule 3  . ondansetron (ZOFRAN ODT) 8 MG disintegrating tablet Take 1 tablet (8 mg total) by mouth every 8 (eight) hours as needed for nausea or vomiting. 20 tablet 0  . saxagliptin HCl (ONGLYZA) 2.5 MG TABS tablet Take 1 tablet (2.5 mg total) by mouth daily. 30 tablet 3  . vitamin C (ASCORBIC ACID) 500 MG tablet Take 500 mg by mouth daily.     No current facility-administered medications on file prior to visit.     BP 123/78   Pulse 93   Temp (!) 96.7 F (35.9 C) (Temporal)   Resp 16   Ht 5\' 5"  (1.651 m)   Wt 164 lb 9.6 oz (74.7 kg)   LMP 04/27/2017 Comment: spotting  SpO2 99%   BMI 27.39 kg/m       Objective:    Physical Exam  General Mental Status- Alert. General Appearance- Not in acute distress.   Skin General: Color- Normal Color. Moisture- Normal Moisture.  Neck Carotid Arteries- Normal color. Moisture- Normal Moisture. No carotid bruits. No JVD.  Chest and Lung Exam Auscultation: Breath Sounds:-Normal.  Cardiovascular Auscultation:Rythm-  Regular. Murmurs & Other Heart Sounds:Auscultation of the heart reveals- No Murmurs.  Abdomen Inspection:-Inspeection Normal. Palpation/Percussion:Note:No mass. Palpation and Percussion of the abdomen reveal- Non Tender, Non Distended + BS, no rebound or guarding.   Neurologic Cranial Nerve exam:- CN III-XII intact(No nystagmus), symmetric smile. Strength:- 5/5 equal and symmetric strength both upper and lower extremities.      Assessment & Plan:  For high cholesterol will check lipid panel today.(fasting)  For diabetes will get a1c and may adjust med plan. May dc metformin due to side effect and increase onglyza dose.  Your bp is well controlled will continue to follow. If bp increases can give ace inhibitor.  Will get covid igg antibody test as you asked.  Follow up likely 3 months or as needed  25 minutes spent with pt. 50% of time spent counseling on plan going forward.

## 2019-06-21 NOTE — Patient Instructions (Signed)
For high cholesterol will check lipid panel today.(fasting)  For diabetes will get a1c and may adjust med plan. May dc metformin due to side effect and increase onglyza dose.  Your bp is well controlled will continue to follow. If bp increases can give ace inhibitor.  Will get covid igg antibody test as you asked.  Follow up likely 3 months or as needed

## 2019-06-22 ENCOUNTER — Telehealth: Payer: Self-pay | Admitting: Medical

## 2019-06-22 ENCOUNTER — Encounter: Payer: Self-pay | Admitting: Medical

## 2019-06-22 LAB — SAR COV2 SEROLOGY (COVID19)AB(IGG),IA: SARS CoV2 AB IGG: NEGATIVE

## 2019-06-22 MED ORDER — SAXAGLIPTIN HCL 5 MG PO TABS: 5.0000 mg | ORAL_TABLET | Freq: Every day | ORAL | 5 refills | Status: DC

## 2019-06-22 NOTE — Telephone Encounter (Signed)
Rx refill onglyza sent to pt pharmacy.

## 2019-06-23 ENCOUNTER — Telehealth: Payer: Self-pay | Admitting: Medical

## 2019-06-23 MED ORDER — ROSUVASTATIN CALCIUM 5 MG PO TABS: 5.0000 mg | ORAL_TABLET | Freq: Every day | ORAL | 3 refills | Status: DC

## 2019-06-23 NOTE — Telephone Encounter (Signed)
Rx crestor sent to pt pharmacy. 

## 2019-06-24 ENCOUNTER — Other Ambulatory Visit: Payer: Self-pay | Admitting: Obstetrics & Gynecology

## 2019-06-28 ENCOUNTER — Other Ambulatory Visit: Payer: Self-pay

## 2019-06-28 ENCOUNTER — Telehealth: Payer: Self-pay | Admitting: *Deleted

## 2019-06-28 ENCOUNTER — Ambulatory Visit
Admission: RE | Admit: 2019-06-28 | Discharge: 2019-06-28 | Disposition: A | Discharge status: Home / Self Care | Payer: 59 | Source: Ambulatory Visit | Attending: Obstetrics & Gynecology | Admitting: Obstetrics & Gynecology

## 2019-06-28 DIAGNOSIS — N6041 Mammary duct ectasia of right breast: Secondary | ICD-10-CM

## 2019-06-28 DIAGNOSIS — N6310 Unspecified lump in the right breast, unspecified quadrant: Secondary | ICD-10-CM

## 2019-06-28 DIAGNOSIS — Z9189 Other specified personal risk factors, not elsewhere classified: Secondary | ICD-10-CM

## 2019-06-28 DIAGNOSIS — N6320 Unspecified lump in the left breast, unspecified quadrant: Secondary | ICD-10-CM

## 2019-06-28 DIAGNOSIS — N6489 Other specified disorders of breast: Secondary | ICD-10-CM

## 2019-06-28 DIAGNOSIS — N6099 Unspecified benign mammary dysplasia of unspecified breast: Secondary | ICD-10-CM

## 2019-06-28 MED ORDER — GADOBUTROL 1 MMOL/ML IV SOLN
7.0000 mL | Freq: Once | INTRAVENOUS | Status: AC | PRN
  Administered 2019-06-28: 7 mL via INTRAVENOUS

## 2019-06-28 NOTE — Telephone Encounter (Signed)
Orders signed.  Ok to call and schedule.

## 2019-06-28 NOTE — Telephone Encounter (Signed)
Left message to call Elizabet Schweppe, RN at [facility] [phone].   

## 2019-06-28 NOTE — Telephone Encounter (Signed)
Left message to call Laydon Martis, RN at [facility] [phone].   

## 2019-06-28 NOTE — Telephone Encounter (Signed)
-----   Message from Volanda Napoleon sent at 06/28/2019  3:27 PM EST ----- Regarding: Breast MRI Recommendations Hi Sharee Pimple, The above patient had a breast MRI 06/28/19 and below are the recommendations of Dr. Kristopher Oppenheim.  RECOMMENDATION: Two area MRI guided biopsy in the bilateral breasts  Please review the recommendations with the patient and enter any necessary orders.  Thank you.  [place]

## 2019-06-28 NOTE — Telephone Encounter (Signed)
Reviewed breast MRI results dated 06/28/19  IMPRESSION: 1. Indeterminate 8 mm enhancing mass in the superior central right breast (series 6, image 86/176). Recommendation is for MRI guided biopsy. 2. Indeterminate 10 mm enhancing mass in the central left breast (series 6, image 110/176). Recommendation is for MRI guided biopsy. 3. No suspicious lymphadenopathy.  RECOMMENDATION: Two area MRI guided biopsy in the bilateral breasts.   Orders pended:  MR LT BREAST BX 1ST LESION - [id number]  MR RT BREAST BX 1ST LESION - [id number]  MM CLIP PLACEMENT LEFT - [id number]  MM CLIP PLACEMENT RIGHT - [id number]    Routing to Dr. [name] Heck to review.

## 2019-06-29 NOTE — Telephone Encounter (Signed)
Spoke with patient, advised per Dr. [name] Heck. Patient is agreeable to proceed with recommended breast biopsies, orders placed. Patient is aware she will be contacted directly by [facility] for scheduling. Patient verbalizes understanding.   [facility] has been notified, they will contact patient to schedule.   Continue IMG hold.   Routing to provider for final review. Patient is agreeable to disposition. Will close encounter.

## 2019-07-07 ENCOUNTER — Ambulatory Visit
Admission: RE | Admit: 2019-07-07 | Discharge: 2019-07-07 | Disposition: A | Discharge status: Home / Self Care | Payer: 59 | Source: Ambulatory Visit | Attending: Obstetrics & Gynecology | Admitting: Obstetrics & Gynecology

## 2019-07-07 ENCOUNTER — Other Ambulatory Visit: Payer: Self-pay | Admitting: Radiology

## 2019-07-07 ENCOUNTER — Other Ambulatory Visit: Payer: Self-pay

## 2019-07-07 DIAGNOSIS — N6320 Unspecified lump in the left breast, unspecified quadrant: Secondary | ICD-10-CM

## 2019-07-07 DIAGNOSIS — N6310 Unspecified lump in the right breast, unspecified quadrant: Secondary | ICD-10-CM

## 2019-07-07 MED ORDER — GADOBUTROL 1 MMOL/ML IV SOLN
8.0000 mL | Freq: Once | INTRAVENOUS | Status: AC | PRN
  Administered 2019-07-07: 8 mL via INTRAVENOUS

## 2019-07-08 ENCOUNTER — Telehealth: Payer: Self-pay | Admitting: Obstetrics & Gynecology

## 2019-07-08 ENCOUNTER — Encounter: Payer: Self-pay | Admitting: Obstetrics & Gynecology

## 2019-07-08 DIAGNOSIS — N649 Disorder of breast, unspecified: Secondary | ICD-10-CM

## 2019-07-08 DIAGNOSIS — N6099 Unspecified benign mammary dysplasia of unspecified breast: Secondary | ICD-10-CM

## 2019-07-08 NOTE — Telephone Encounter (Signed)
07/07/19 path report:  1. Breast, right, needle core biopsy, superior central - FIBROADENOMA - PSEUDOANGIOMATOUS STROMAL HYPERPLASIA - NO MALIGNANCY IDENTIFIED 2. Breast, left, needle core biopsy, central - COMPLEX SCLEROSING LESION WITH USUAL DUCTAL HYPERPLASIA - SEE COMMENTS  Routing MyChart message to Dr. [name] Heck to review and advise.

## 2019-07-08 NOTE — Telephone Encounter (Signed)
Patient sent the following correspondence through [place].  They called with results from bios. Recommending surgery on left side. Is there someone you recommend? They said they were going to set up a consult. They didn't give me a name yet.  Thanks [name]

## 2019-07-10 NOTE — Telephone Encounter (Signed)
Communicated with pt via mychart.  She would like [place] to make referral.  I recommended Dr. Donne Hazel.  Please see if can make appt.  Thanks.

## 2019-07-11 ENCOUNTER — Other Ambulatory Visit: Payer: Self-pay | Admitting: Medical

## 2019-07-11 NOTE — Telephone Encounter (Signed)
Urgent referral placed to general surgeon, Dr. Donne Hazel.   MyChart message to patient to notify.   Routing to [employer] .

## 2019-07-12 ENCOUNTER — Telehealth: Payer: Self-pay | Admitting: Obstetrics & Gynecology

## 2019-07-12 NOTE — Telephone Encounter (Signed)
Spoke with patient and conveyed appointment details. Patient is agreeable with the appointment date and time.

## 2019-07-12 NOTE — Telephone Encounter (Signed)
Left voicemail regarding referral appointment. The information is listed below. Should the patient need to cancel or reschedule this appointment, Please advise them to call the office they've been referred to in order to reschedule.  Dr. Donne Hazel 08/03/19 @ 3:40 pm. Please arrive 15 minutes early and bring your insurance card and photo id and list of medications.  [facility] [address] [place]  [place], [place] [address] [phone]

## 2019-08-03 ENCOUNTER — Other Ambulatory Visit: Payer: Self-pay | Admitting: General Surgery

## 2019-08-03 DIAGNOSIS — N6489 Other specified disorders of breast: Secondary | ICD-10-CM

## 2019-08-07 ENCOUNTER — Encounter: Payer: Self-pay | Admitting: Medical

## 2019-08-16 ENCOUNTER — Other Ambulatory Visit: Payer: Self-pay | Admitting: General Surgery

## 2019-08-16 DIAGNOSIS — N6489 Other specified disorders of breast: Secondary | ICD-10-CM

## 2019-08-22 HISTORY — PX: BREAST EXCISIONAL BIOPSY: SUR124

## 2019-09-05 ENCOUNTER — Encounter (INDEPENDENT_AMBULATORY_CARE_PROVIDER_SITE_OTHER): Payer: Self-pay

## 2019-09-06 ENCOUNTER — Encounter (HOSPITAL_BASED_OUTPATIENT_CLINIC_OR_DEPARTMENT_OTHER): Payer: Self-pay | Admitting: General Surgery

## 2019-09-06 ENCOUNTER — Other Ambulatory Visit: Payer: Self-pay

## 2019-09-09 ENCOUNTER — Encounter (HOSPITAL_BASED_OUTPATIENT_CLINIC_OR_DEPARTMENT_OTHER)
Admission: RE | Admit: 2019-09-09 | Discharge: 2019-09-09 | Disposition: A | Discharge status: Home / Self Care | Payer: 59 | Source: Ambulatory Visit | Attending: General Surgery | Admitting: General Surgery

## 2019-09-09 ENCOUNTER — Other Ambulatory Visit (HOSPITAL_COMMUNITY)
Admission: RE | Admit: 2019-09-09 | Discharge: 2019-09-09 | Disposition: A | Discharge status: Home / Self Care | Payer: 59 | Source: Ambulatory Visit | Attending: General Surgery | Admitting: General Surgery

## 2019-09-09 ENCOUNTER — Other Ambulatory Visit: Payer: Self-pay

## 2019-09-09 DIAGNOSIS — Z20822 Contact with and (suspected) exposure to covid-19: Secondary | ICD-10-CM | POA: Insufficient documentation

## 2019-09-09 DIAGNOSIS — Z01818 Encounter for other preprocedural examination: Secondary | ICD-10-CM | POA: Diagnosis not present

## 2019-09-09 LAB — BASIC METABOLIC PANEL
Anion gap: 9 (ref 5–15)
BUN: 13 mg/dL (ref 6–20)
CO2: 26 mmol/L (ref 22–32)
Calcium: 9.6 mg/dL (ref 8.9–10.3)
Chloride: 103 mmol/L (ref 98–111)
Creatinine, Ser: 0.73 mg/dL (ref 0.44–1.00)
GFR calc Af Amer: >60 mL/min (ref >60)
GFR calc non Af Amer: >60 mL/min (ref >60)
Glucose, Bld: 124 mg/dL — ABNORMAL HIGH (ref 70–99)
Potassium: 5.6 mmol/L — ABNORMAL HIGH (ref 3.5–5.1)
Sodium: 138 mmol/L (ref 135–145)

## 2019-09-09 LAB — SARS CORONAVIRUS 2 (TAT 6-24 HRS): SARS Coronavirus 2: NEGATIVE

## 2019-09-09 MED ORDER — ENSURE PRE-SURGERY PO LIQD: 296.0000 mL | Freq: Once | ORAL | Status: DC

## 2019-09-09 NOTE — Progress Notes (Signed)

## 2019-09-12 ENCOUNTER — Ambulatory Visit
Admission: RE | Admit: 2019-09-12 | Discharge: 2019-09-12 | Disposition: A | Discharge status: Home / Self Care | Payer: 59 | Source: Ambulatory Visit | Attending: General Surgery | Admitting: General Surgery

## 2019-09-12 ENCOUNTER — Other Ambulatory Visit: Payer: Self-pay

## 2019-09-12 DIAGNOSIS — N6489 Other specified disorders of breast: Secondary | ICD-10-CM

## 2019-09-12 NOTE — Progress Notes (Signed)
Lab results (K 5.6) reviewed with Dr. Deatra Canter, will proceed with surgery and no recheck needed.

## 2019-09-13 ENCOUNTER — Ambulatory Visit (HOSPITAL_BASED_OUTPATIENT_CLINIC_OR_DEPARTMENT_OTHER): Payer: 59 | Admitting: Certified Registered Nurse Anesthetist

## 2019-09-13 ENCOUNTER — Encounter (HOSPITAL_BASED_OUTPATIENT_CLINIC_OR_DEPARTMENT_OTHER)
Admission: RE | Disposition: A | Discharge status: Home / Self Care | Payer: Self-pay | Source: Home / Self Care | Attending: General Surgery

## 2019-09-13 ENCOUNTER — Ambulatory Visit
Admission: RE | Admit: 2019-09-13 | Discharge: 2019-09-13 | Disposition: A | Discharge status: Home / Self Care | Payer: 59 | Source: Ambulatory Visit | Attending: General Surgery | Admitting: General Surgery

## 2019-09-13 ENCOUNTER — Other Ambulatory Visit: Payer: Self-pay

## 2019-09-13 ENCOUNTER — Encounter (HOSPITAL_BASED_OUTPATIENT_CLINIC_OR_DEPARTMENT_OTHER): Payer: Self-pay | Admitting: General Surgery

## 2019-09-13 ENCOUNTER — Ambulatory Visit (HOSPITAL_BASED_OUTPATIENT_CLINIC_OR_DEPARTMENT_OTHER)
Admission: RE | Admit: 2019-09-13 | Discharge: 2019-09-13 | Disposition: A | Discharge status: Home / Self Care | Payer: 59 | Attending: General Surgery | Admitting: General Surgery

## 2019-09-13 DIAGNOSIS — Z8049 Family history of malignant neoplasm of other genital organs: Secondary | ICD-10-CM | POA: Diagnosis not present

## 2019-09-13 DIAGNOSIS — Z8041 Family history of malignant neoplasm of ovary: Secondary | ICD-10-CM | POA: Diagnosis not present

## 2019-09-13 DIAGNOSIS — N6489 Other specified disorders of breast: Secondary | ICD-10-CM

## 2019-09-13 DIAGNOSIS — Z8543 Personal history of malignant neoplasm of ovary: Secondary | ICD-10-CM | POA: Diagnosis not present

## 2019-09-13 DIAGNOSIS — K219 Gastro-esophageal reflux disease without esophagitis: Secondary | ICD-10-CM | POA: Diagnosis not present

## 2019-09-13 DIAGNOSIS — Z803 Family history of malignant neoplasm of breast: Secondary | ICD-10-CM | POA: Diagnosis not present

## 2019-09-13 DIAGNOSIS — E119 Type 2 diabetes mellitus without complications: Secondary | ICD-10-CM | POA: Diagnosis not present

## 2019-09-13 DIAGNOSIS — Z79899 Other long term (current) drug therapy: Secondary | ICD-10-CM | POA: Insufficient documentation

## 2019-09-13 DIAGNOSIS — Z7984 Long term (current) use of oral hypoglycemic drugs: Secondary | ICD-10-CM | POA: Diagnosis not present

## 2019-09-13 DIAGNOSIS — Z90721 Acquired absence of ovaries, unilateral: Secondary | ICD-10-CM | POA: Diagnosis not present

## 2019-09-13 DIAGNOSIS — R928 Other abnormal and inconclusive findings on diagnostic imaging of breast: Secondary | ICD-10-CM | POA: Diagnosis present

## 2019-09-13 HISTORY — PX: RADIOACTIVE SEED GUIDED EXCISIONAL BREAST BIOPSY: SHX6490

## 2019-09-13 HISTORY — DX: Nausea with vomiting, unspecified: R11.2

## 2019-09-13 HISTORY — DX: Nausea with vomiting, unspecified: Z98.890

## 2019-09-13 LAB — GLUCOSE, CAPILLARY
Glucose-Capillary: 108 mg/dL — ABNORMAL HIGH (ref 70–99)
Glucose-Capillary: 120 mg/dL — ABNORMAL HIGH (ref 70–99)

## 2019-09-13 SURGERY — RADIOACTIVE SEED GUIDED BREAST BIOPSY
Anesthesia: General | Site: Breast | Laterality: Left

## 2019-09-13 MED ORDER — GABAPENTIN 100 MG PO CAPS
100.0000 mg | ORAL_CAPSULE | ORAL | Status: AC
  Administered 2019-09-13: 12:00:00 100 mg via ORAL

## 2019-09-13 MED ORDER — PROMETHAZINE HCL 25 MG/ML IJ SOLN: 6.2500 mg | INTRAMUSCULAR | Status: DC | PRN

## 2019-09-13 MED ORDER — FENTANYL CITRATE (PF) 100 MCG/2ML IJ SOLN
INTRAMUSCULAR | Status: AC
  Filled 2019-09-13: qty 2

## 2019-09-13 MED ORDER — KETOROLAC TROMETHAMINE 15 MG/ML IJ SOLN
15.0000 mg | INTRAMUSCULAR | Status: AC
  Administered 2019-09-13: 15 mg via INTRAVENOUS

## 2019-09-13 MED ORDER — LACTATED RINGERS IV SOLN: INTRAVENOUS | Status: DC

## 2019-09-13 MED ORDER — ACETAMINOPHEN 500 MG PO TABS
ORAL_TABLET | ORAL | Status: AC
  Filled 2019-09-13: qty 2

## 2019-09-13 MED ORDER — ONDANSETRON HCL 4 MG/2ML IJ SOLN
INTRAMUSCULAR | Status: DC | PRN
  Administered 2019-09-13: 4 mg via INTRAVENOUS

## 2019-09-13 MED ORDER — SCOPOLAMINE 1 MG/3DAYS TD PT72
1.0000 | MEDICATED_PATCH | Freq: Once | TRANSDERMAL | Status: AC
  Administered 2019-09-13: 12:00:00 1 via TRANSDERMAL

## 2019-09-13 MED ORDER — CEFAZOLIN SODIUM-DEXTROSE 2-4 GM/100ML-% IV SOLN
2.0000 g | INTRAVENOUS | Status: AC
  Administered 2019-09-13: 12:00:00 2 g via INTRAVENOUS

## 2019-09-13 MED ORDER — FENTANYL CITRATE (PF) 100 MCG/2ML IJ SOLN
INTRAMUSCULAR | Status: DC | PRN
  Administered 2019-09-13 (×2): 50 ug via INTRAVENOUS

## 2019-09-13 MED ORDER — MIDAZOLAM HCL 2 MG/2ML IJ SOLN
INTRAMUSCULAR | Status: AC
  Filled 2019-09-13: qty 2

## 2019-09-13 MED ORDER — PROPOFOL 500 MG/50ML IV EMUL
INTRAVENOUS | Status: DC | PRN
  Administered 2019-09-13: 150 ug/kg/min via INTRAVENOUS

## 2019-09-13 MED ORDER — PROPOFOL 10 MG/ML IV BOLUS
INTRAVENOUS | Status: AC
  Filled 2019-09-13: qty 20

## 2019-09-13 MED ORDER — FENTANYL CITRATE (PF) 100 MCG/2ML IJ SOLN
25.0000 ug | INTRAMUSCULAR | Status: DC | PRN
  Administered 2019-09-13 (×2): 25 ug via INTRAVENOUS

## 2019-09-13 MED ORDER — DEXAMETHASONE SODIUM PHOSPHATE 10 MG/ML IJ SOLN
INTRAMUSCULAR | Status: DC | PRN
  Administered 2019-09-13: 4 mg via INTRAVENOUS

## 2019-09-13 MED ORDER — GABAPENTIN 100 MG PO CAPS
ORAL_CAPSULE | ORAL | Status: AC
  Filled 2019-09-13: qty 1

## 2019-09-13 MED ORDER — PHENYLEPHRINE 40 MCG/ML (10ML) SYRINGE FOR IV PUSH (FOR BLOOD PRESSURE SUPPORT)
PREFILLED_SYRINGE | INTRAVENOUS | Status: DC | PRN
  Administered 2019-09-13: 80 ug via INTRAVENOUS

## 2019-09-13 MED ORDER — PROPOFOL 10 MG/ML IV BOLUS
INTRAVENOUS | Status: DC | PRN
  Administered 2019-09-13: 200 mg via INTRAVENOUS

## 2019-09-13 MED ORDER — MIDAZOLAM HCL 5 MG/5ML IJ SOLN
INTRAMUSCULAR | Status: DC | PRN
  Administered 2019-09-13: 2 mg via INTRAVENOUS

## 2019-09-13 MED ORDER — BUPIVACAINE HCL (PF) 0.25 % IJ SOLN
INTRAMUSCULAR | Status: DC | PRN
  Administered 2019-09-13: 10 mL

## 2019-09-13 MED ORDER — DEXAMETHASONE SODIUM PHOSPHATE 10 MG/ML IJ SOLN
INTRAMUSCULAR | Status: AC
  Filled 2019-09-13: qty 1

## 2019-09-13 MED ORDER — LIDOCAINE 2% (20 MG/ML) 5 ML SYRINGE
INTRAMUSCULAR | Status: AC
  Filled 2019-09-13: qty 5

## 2019-09-13 MED ORDER — SCOPOLAMINE 1 MG/3DAYS TD PT72
MEDICATED_PATCH | TRANSDERMAL | Status: AC
  Filled 2019-09-13: qty 1

## 2019-09-13 MED ORDER — KETOROLAC TROMETHAMINE 15 MG/ML IJ SOLN
INTRAMUSCULAR | Status: AC
  Filled 2019-09-13: qty 1

## 2019-09-13 MED ORDER — TRAMADOL HCL 50 MG PO TABS: 50.0000 mg | ORAL_TABLET | Freq: Four times a day (QID) | ORAL | 0 refills | Status: DC | PRN

## 2019-09-13 MED ORDER — ONDANSETRON HCL 4 MG/2ML IJ SOLN
INTRAMUSCULAR | Status: AC
  Filled 2019-09-13: qty 2

## 2019-09-13 MED ORDER — ACETAMINOPHEN 500 MG PO TABS
1000.0000 mg | ORAL_TABLET | ORAL | Status: AC
  Administered 2019-09-13: 12:00:00 1000 mg via ORAL

## 2019-09-13 SURGICAL SUPPLY — 46 items
BINDER BREAST XLRG (GAUZE/BANDAGES/DRESSINGS) ×2 IMPLANT
BINDER BREAST XXLRG (GAUZE/BANDAGES/DRESSINGS) IMPLANT
BLADE SURG 15 STRL LF DISP TIS (BLADE) ×1 IMPLANT
BLADE SURG 15 STRL SS (BLADE) ×2
CHLORAPREP W/TINT 26 (MISCELLANEOUS) ×3 IMPLANT
CLOSURE WOUND 1/2 X4 (GAUZE/BANDAGES/DRESSINGS) ×1
COVER BACK TABLE 60X90IN (DRAPES) ×3 IMPLANT
COVER MAYO STAND STRL (DRAPES) ×3 IMPLANT
COVER PROBE W GEL 5X96 (DRAPES) ×3 IMPLANT
DERMABOND ADVANCED (GAUZE/BANDAGES/DRESSINGS) ×2
DERMABOND ADVANCED .7 DNX12 (GAUZE/BANDAGES/DRESSINGS) ×1 IMPLANT
DRAPE LAPAROSCOPIC ABDOMINAL (DRAPES) ×3 IMPLANT
DRAPE UTILITY XL STRL (DRAPES) ×3 IMPLANT
ELECT COATED BLADE 2.86 ST (ELECTRODE) ×3 IMPLANT
ELECT REM PT RETURN 9FT ADLT (ELECTROSURGICAL) ×3
ELECTRODE REM PT RTRN 9FT ADLT (ELECTROSURGICAL) ×1 IMPLANT
GLOVE BIO SURGEON STRL SZ7 (GLOVE) ×6 IMPLANT
GLOVE BIOGEL PI IND STRL 7.0 (GLOVE) IMPLANT
GLOVE BIOGEL PI IND STRL 7.5 (GLOVE) ×1 IMPLANT
GLOVE BIOGEL PI INDICATOR 7.0 (GLOVE) ×4
GLOVE BIOGEL PI INDICATOR 7.5 (GLOVE) ×2
GLOVE SURG SS PI 6.5 STRL IVOR (GLOVE) ×2 IMPLANT
GLOVE SURG SS PI 7.0 STRL IVOR (GLOVE) ×2 IMPLANT
GLOVE SURG SS PI 8.0 STRL IVOR (GLOVE) ×2 IMPLANT
GOWN STRL REUS W/ TWL LRG LVL3 (GOWN DISPOSABLE) ×2 IMPLANT
GOWN STRL REUS W/TWL LRG LVL3 (GOWN DISPOSABLE) ×4
KIT MARKER MARGIN INK (KITS) ×3 IMPLANT
NDL HYPO 25X1 1.5 SAFETY (NEEDLE) ×1 IMPLANT
NEEDLE HYPO 25X1 1.5 SAFETY (NEEDLE) ×3 IMPLANT
NS IRRIG 1000ML POUR BTL (IV SOLUTION) ×2 IMPLANT
PACK BASIN DAY SURGERY FS (CUSTOM PROCEDURE TRAY) ×3 IMPLANT
PENCIL SMOKE EVACUATOR (MISCELLANEOUS) ×3 IMPLANT
SLEEVE SCD COMPRESS KNEE MED (MISCELLANEOUS) ×3 IMPLANT
SPONGE LAP 4X18 RFD (DISPOSABLE) ×3 IMPLANT
STRIP CLOSURE SKIN 1/2X4 (GAUZE/BANDAGES/DRESSINGS) ×2 IMPLANT
SUT MON AB 5-0 PS2 18 (SUTURE) ×2 IMPLANT
SUT VIC AB 2-0 SH 27 (SUTURE) ×2
SUT VIC AB 2-0 SH 27XBRD (SUTURE) ×1 IMPLANT
SUT VIC AB 3-0 SH 27 (SUTURE) ×2
SUT VIC AB 3-0 SH 27X BRD (SUTURE) ×1 IMPLANT
SYR CONTROL 10ML LL (SYRINGE) ×3 IMPLANT
TOWEL GREEN STERILE FF (TOWEL DISPOSABLE) ×3 IMPLANT
TRAY FAXITRON CT DISP (TRAY / TRAY PROCEDURE) ×3 IMPLANT
TUBE CONNECTING 20'X1/4 (TUBING) ×1
TUBE CONNECTING 20X1/4 (TUBING) ×1 IMPLANT
YANKAUER SUCT BULB TIP NO VENT (SUCTIONS) ×2 IMPLANT

## 2019-09-13 NOTE — Anesthesia Procedure Notes (Signed)
Procedure Name: LMA Insertion Date/Time: 09/13/2019 12:16 PM Performed by: Genelle Bal, CRNA Pre-anesthesia Checklist: Patient identified, Emergency Drugs available, Suction available and Patient being monitored Patient Re-evaluated:Patient Re-evaluated prior to induction Oxygen Delivery Method: Circle system utilized Preoxygenation: Pre-oxygenation with 100% oxygen Induction Type: IV induction Ventilation: Mask ventilation without difficulty LMA: LMA inserted LMA Size: 4.0 Number of attempts: 1 Airway Equipment and Method: Bite block Placement Confirmation: positive ETCO2 Tube secured with: Tape Dental Injury: Teeth and Oropharynx as per pre-operative assessment

## 2019-09-13 NOTE — Interval H&P Note (Signed)
History and Physical Interval Note:  09/13/2019 11:56 AM  Carolyn Wilson  has presented today for surgery, with the diagnosis of LEFT BREAST ABNORMAL MAMMOGRAM.  The various methods of treatment have been discussed with the patient and family. After consideration of risks, benefits and other options for treatment, the patient has consented to  Procedure(s): RADIOACTIVE SEED GUIDED EXCISIONAL LEFT BREAST BIOPSY (Left) as a surgical intervention.  The patient's history has been reviewed, patient examined, no change in status, stable for surgery.  I have reviewed the patient's chart and labs.  Questions were answered to the patient's satisfaction.     [name]

## 2019-09-13 NOTE — Op Note (Signed)
Preoperative diagnosis:Left breast mammographic distortion Postoperative diagnosis: Same as above Procedure: Left breast seed guided excisional biopsy Surgeon: Dr Serita Grammes Anesthesia: General estimated blood loss: minimal Specimens: left breast tissue marked with paint containing seed and clip Drains none Complications none Sponge needle count was correct at completion Disposition to recovery in stable condition  Indications: 67 yof referred by Dr [name] Heck for left breast mri abnormality. She has mm from 5/20 that is negative. she has mri from 12/20 that shows no abnormal nodes, bilateral indeterminate masses. these have undergone biopsy and the right side is FA with PASH and the left side is CSL with UDH. she has prior biopsies that are all benign as well. she is here to discuss options. We discussed left breast seed guided excision.   Procedure: After informed consent was obtained the patient was first given antibiotics. SCDs were placed. She was placed under general anesthesia without complication. She was prepped and draped in the standard sterile surgical fashion. Surgical timeout was then performed.  I had the mammograms available for my review in the operating room. I confirmed the seed was present prior to beginning. I made a periareolar incision to hide the scar later.  I infiltrated this with Marcaine. I then made an incision. I then removed the seed and some of the surrounding tissue. I then marked this with paint. I passed this off the table and mammogram confirmed removal of the seed and the clip. I then obtained hemostasis. I closed the breast tissue with 2-0 Vicryl suture. I then closed the skin with 3-0 Vicryl and 5-0 Monocryl. Glue and Steri-Strips were applied. She tolerated this well was extubated and transferred to recovery stable.

## 2019-09-13 NOTE — Interval H&P Note (Signed)
History and Physical Interval Note:  09/13/2019 11:57 AM  Carolyn Wilson  has presented today for surgery, with the diagnosis of LEFT BREAST ABNORMAL MAMMOGRAM.  The various methods of treatment have been discussed with the patient and family. After consideration of risks, benefits and other options for treatment, the patient has consented to  Procedure(s): RADIOACTIVE SEED GUIDED EXCISIONAL LEFT BREAST BIOPSY (Left) as a surgical intervention.  The patient's history has been reviewed, patient examined, no change in status, stable for surgery.  I have reviewed the patient's chart and labs.  Questions were answered to the patient's satisfaction.     [name]

## 2019-09-13 NOTE — H&P (Signed)
49 yof referred by Dr [name] Heck for left breast mri abnormality. she has high risk for breast cancer. 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 she has left breast excision that is benign as well. she works as Hydrologist for [employer] she has mm from 5/20 that is negative. she has mri from 12/20 that shows no abnormal nodes, bilateral indeterminate masses. these have undergone biopsy and the right side is FA with PASH and the left side is CSL with UDH. she has prior biopsies that are all benign as well. she is here to discuss options  Past Surgical History  Appendectomy  Breast Biopsy  Bilateral. multiple Breast Mass; Local Excision  Left. Colon Polyp Removal - Colonoscopy  Hysterectomy (due to cancer) - Partial   Diagnostic Studies History  Colonoscopy  1-5 years ago within last year Mammogram  within last year Pap Smear  1-5 years ago  Allergies Peanut (Diagnostic) *DIAGNOSTIC PRODUCTS*  Sulfa 10 *OPHTHALMIC AGENTS*  Latex  Adhesive Tape  Allergies Reconciled   Social History Alcohol use  Occasional alcohol use. No caffeine use  No drug use  Tobacco use  Never smoker.  Family History ( Bleeding disorder  Mother. Breast Cancer  Family Members In General. Cervical Cancer  Family Members In General. Colon Cancer  Family Members In General, Father. Colon Polyps  Father. Diabetes Mellitus  Family Members In General, Mother. Heart Disease  Family Members In General, Mother. Heart disease in female family member before age 74  Ovarian Cancer  Family Members In General. Respiratory Condition  Mother.  Pregnancy / Birth History  Age at menarche  75 years. Age of menopause  78-50 <45 Gravida  1 Maternal age  73-25 Para  1  Other Problems  Breast Cancer  Diabetes Mellitus  Gastroesophageal Reflux Disease  Lump In Breast  Ovarian Cancer   Review of Systems  General Not Present- Appetite Loss,  Chills, Fatigue, Fever, Night Sweats, Weight Gain and Weight Loss. Skin Not Present- Change in Wart/Mole, Dryness, Hives, Jaundice, New Lesions, Non-Healing Wounds, Rash and Ulcer. HEENT Present- Seasonal Allergies and Wears glasses/contact lenses. Not Present- Earache, Hearing Loss, Hoarseness, Nose Bleed, Oral Ulcers, Ringing in the Ears, Sinus Pain, Sore Throat, Visual Disturbances and Yellow Eyes. Respiratory Not Present- Bloody sputum, Chronic Cough, Difficulty Breathing, Snoring and Wheezing. Breast Present- Breast Mass. Not Present- Breast Pain, Nipple Discharge and Skin Changes. Cardiovascular Present- Rapid Heart Rate. Not Present- Chest Pain, Difficulty Breathing Lying Down, Leg Cramps, Palpitations, Shortness of Breath and Swelling of Extremities. Gastrointestinal Present- Hemorrhoids. Not Present- Abdominal Pain, Bloating, Bloody Stool, Change in Bowel Habits, Chronic diarrhea, Constipation, Difficulty Swallowing, Excessive gas, Gets full quickly at meals, Indigestion, Nausea, Rectal Pain and Vomiting. Female Genitourinary Not Present- Frequency, Nocturia, Painful Urination, Pelvic Pain and Urgency. Musculoskeletal Not Present- Back Pain, Joint Pain, Joint Stiffness, Muscle Pain, Muscle Weakness and Swelling of Extremities. Neurological Not Present- Decreased Memory, Fainting, Headaches, Numbness, Seizures, Tingling, Tremor, Trouble walking and Weakness. Psychiatric Not Present- Anxiety, Bipolar, Change in Sleep Pattern, Depression, Fearful and Frequent crying. Endocrine Not Present- Cold Intolerance, Excessive Hunger, Hair Changes, Heat Intolerance, Hot flashes and New Diabetes. Hematology Not Present- Blood Thinners, Easy Bruising, Excessive bleeding, Gland problems, HIV and Persistent Infections.   Physical Exam  General Mental Status-Alert. Orientation-Oriented X3. Breast Nipples-No Discharge. Breast Lump-No Palpable Breast Mass. Lymphatic Head & Neck General Head &  Neck Lymphatics: Bilateral - Description - Normal. Axillary General Axillary Region: Bilateral - Description - Normal.  Note: no [place] adenopathy  Assessment & Plan  RADIAL SCAR OF LEFT BREAST (N64.89) Story: Left breast seed guided excisional biopsy discussed options of observation vs excision. discussed local rate of atypia/dcis/cancer on excision about 15-18% with csl on core and distortion. with fh and young age would like to proceed with excision. discussed seed guided excision with risks, recovery, will proceed

## 2019-09-13 NOTE — Discharge Instructions (Signed)
[place] Office Phone Number [phone]  BREAST BIOPSY/ PARTIAL MASTECTOMY: POST OP INSTRUCTIONS Take 400 mg of ibuprofen every 8 hours or 650 mg tylenol every 6 hours for next 72 hours then as needed. Use ice several times daily also. Always review your discharge instruction sheet given to you by the facility where your surgery was performed.  IF YOU HAVE DISABILITY OR FAMILY LEAVE FORMS, YOU MUST BRING THEM TO THE OFFICE FOR PROCESSING.  DO NOT GIVE THEM TO YOUR DOCTOR.  1. A prescription for pain medication may be given to you upon discharge.  Take your pain medication as prescribed, if needed.  If narcotic pain medicine is not needed, then you may take acetaminophen (Tylenol), naprosyn (Alleve) or ibuprofen (Advil) as needed. 2. Take your usually prescribed medications unless otherwise directed 3. If you need a refill on your pain medication, please contact your pharmacy.  They will contact our office to request authorization.  Prescriptions will not be filled after 5pm or on week-ends. 4. You should eat very light the first 24 hours after surgery, such as soup, crackers, pudding, etc.  Resume your normal diet the day after surgery. 5. Most patients will experience some swelling and bruising in the breast.  Ice packs and a good support bra will help.  Wear the breast binder provided or a sports bra for 72 hours day and night.  After that wear a sports bra during the day until you return to the office. Swelling and bruising can take several days to resolve.  6. It is common to experience some constipation if taking pain medication after surgery.  Increasing fluid intake and taking a stool softener will usually help or prevent this problem from occurring.  A mild laxative (Milk of Magnesia or Miralax) should be taken according to package directions if there are no bowel movements after 48 hours. 7. Unless discharge instructions indicate otherwise, you may remove your bandages 48  hours after surgery and you may shower at that time.  You may have steri-strips (small skin tapes) in place directly over the incision.  These strips should be left on the skin for 7-10 days and will come off on their own.  If your surgeon used skin glue on the incision, you may shower in 24 hours.  The glue will flake off over the next 2-3 weeks.  Any sutures or staples will be removed at the office during your follow-up visit. 8. ACTIVITIES:  You may resume regular daily activities (gradually increasing) beginning the next day.  Wearing a good support bra or sports bra minimizes pain and swelling.  You may have sexual intercourse when it is comfortable. a. You may drive when you no longer are taking prescription pain medication, you can comfortably wear a seatbelt, and you can safely maneuver your car and apply brakes. b. RETURN TO WORK:  ______________________________________________________________________________________ 9. You should see your doctor in the office for a follow-up appointment approximately two weeks after your surgery.  Your doctors nurse will typically make your follow-up appointment when she calls you with your pathology report.  Expect your pathology report 3-4 business days after your surgery.  You may call to check if you do not hear from [place] after three days. 10. OTHER INSTRUCTIONS: _______________________________________________________________________________________________ _____________________________________________________________________________________________________________________________________ _____________________________________________________________________________________________________________________________________ _____________________________________________________________________________________________________________________________________  WHEN TO CALL DR WAKEFIELD: 1. Fever over 101.0 2. Nausea and/or vomiting. 3. Extreme swelling or  bruising. 4. Continued bleeding from incision. 5. Increased pain, redness, or drainage from the incision.  The clinic  staff is available to answer your questions during regular business hours.  Please dont hesitate to call and ask to speak to one of the nurses for clinical concerns.  If you have a medical emergency, go to the nearest emergency room or call 911.  A surgeon from [facility] is always on call at the hospital.  For further questions, please visit [email or web address]     No Tylenol until 6 PM on 09/13/2019 No Ibuprofen until 8:45 PM on 09/13/2019      Post Anesthesia Home Care Instructions  Activity: Get plenty of rest for the remainder of the day. A responsible individual must stay with you for 24 hours following the procedure.  For the next 24 hours, DO NOT: -Drive a car -Paediatric nurse -Drink alcoholic beverages -Take any medication unless instructed by your physician -Make any legal decisions or sign important papers.  Meals: Start with liquid foods such as gelatin or soup. Progress to regular foods as tolerated. Avoid greasy, spicy, heavy foods. If nausea and/or vomiting occur, drink only clear liquids until the nausea and/or vomiting subsides. Call your physician if vomiting continues.  Special Instructions/Symptoms: Your throat may feel dry or sore from the anesthesia or the breathing tube placed in your throat during surgery. If this causes discomfort, gargle with warm salt water. The discomfort should disappear within 24 hours.  If you had a scopolamine patch placed behind your ear for the management of post- operative nausea and/or vomiting:  1. The medication in the patch is effective for 72 hours, after which it should be removed.  Wrap patch in a tissue and discard in the trash. Wash hands thoroughly with soap and water. 2. You may remove the patch earlier than 72 hours if you experience unpleasant side effects which may include  dry mouth, dizziness or visual disturbances. 3. Avoid touching the patch. Wash your hands with soap and water after contact with the patch.

## 2019-09-13 NOTE — Transfer of Care (Signed)
Immediate Anesthesia Transfer of Care Note  Patient: Carolyn Wilson  Procedure(s) Performed: RADIOACTIVE SEED GUIDED EXCISIONAL LEFT BREAST BIOPSY (Left Breast)  Patient Location: PACU  Anesthesia Type:General  Level of Consciousness: awake, alert  and oriented  Airway & Oxygen Therapy: Patient Spontanous Breathing and Patient connected to face mask oxygen  Post-op Assessment: Report given to RN and Post -op Vital signs reviewed and stable  Post vital signs: Reviewed and stable  Last Vitals:  Vitals Value Taken Time  BP 81/56 09/13/19 1301  Temp    Pulse 80 09/13/19 1303  Resp 13 09/13/19 1303  SpO2 100 % 09/13/19 1303  Vitals shown include unvalidated device data.  Last Pain:  Vitals:   09/13/19 1141  TempSrc: Tympanic  PainSc: 0-No pain      Patients Stated Pain Goal: 7 ([id number])  Complications: No apparent anesthesia complications

## 2019-09-13 NOTE — Anesthesia Preprocedure Evaluation (Addendum)
Anesthesia Evaluation  Patient identified by MRN, date of birth, ID band Patient awake    Reviewed: Allergy & Precautions, NPO status , Patient's Chart, lab work & pertinent test results  History of Anesthesia Complications (+) PONV and history of anesthetic complications  Airway Mallampati: II  TM Distance: >3 FB Neck ROM: Full    Dental  (+) Teeth Intact, Dental Advisory Given   Pulmonary asthma ,    Pulmonary exam normal breath sounds clear to auscultation       Cardiovascular negative cardio ROS Normal cardiovascular exam Rhythm:Regular Rate:Normal     Neuro/Psych  Headaches,    GI/Hepatic Neg liver ROS, GERD  Medicated and Controlled,  Endo/Other  diabetes, Type 2, Oral Hypoglycemic Agents  Renal/GU negative Renal ROS     Musculoskeletal negative musculoskeletal ROS (+)   Abdominal   Peds  Hematology negative hematology ROS (+)   Anesthesia Other Findings Day of surgery medications reviewed with the patient.  LEFT BREAST ABNORMAL MAMMOGRAM  Reproductive/Obstetrics                            Anesthesia Physical Anesthesia Plan  ASA: II  Anesthesia Plan: General   Post-op Pain Management:    Induction: Intravenous  PONV Risk Score and Plan: 4 or greater and Diphenhydramine, Scopolamine patch - Pre-op, Midazolam, Dexamethasone, Ondansetron and TIVA  Airway Management Planned: LMA  Additional Equipment:   Intra-op Plan:   Post-operative Plan: Extubation in OR  Informed Consent: I have reviewed the patients History and Physical, chart, labs and discussed the procedure including the risks, benefits and alternatives for the proposed anesthesia with the patient or authorized representative who has indicated his/her understanding and acceptance.     Dental advisory given  Plan Discussed with: CRNA  Anesthesia Plan Comments:        Anesthesia Quick Evaluation

## 2019-09-13 NOTE — Anesthesia Postprocedure Evaluation (Signed)
Anesthesia Post Note  Patient: Carolyn Wilson  Procedure(s) Performed: RADIOACTIVE SEED GUIDED EXCISIONAL LEFT BREAST BIOPSY (Left Breast)     Patient location during evaluation: PACU Anesthesia Type: General Level of consciousness: awake and alert Pain management: pain level controlled Vital Signs Assessment: post-procedure vital signs reviewed and stable Respiratory status: spontaneous breathing, nonlabored ventilation and respiratory function stable Cardiovascular status: blood pressure returned to baseline and stable Postop Assessment: no apparent nausea or vomiting Anesthetic complications: no    Last Vitals:  Vitals:   09/13/19 1414 09/13/19 1430  BP: 108/76   Pulse: 92 88  Resp: 20   Temp: 36.8 C   SpO2: 94% 95%    Last Pain:  Vitals:   09/13/19 1414  TempSrc: Oral  PainSc: 1                  Catalina Gravel

## 2019-09-14 ENCOUNTER — Encounter: Payer: Self-pay | Admitting: *Deleted

## 2019-09-15 LAB — SURGICAL PATHOLOGY

## 2019-10-01 ENCOUNTER — Other Ambulatory Visit: Payer: Self-pay | Admitting: Medical

## 2019-10-01 DIAGNOSIS — E1169 Type 2 diabetes mellitus with other specified complication: Secondary | ICD-10-CM

## 2019-10-01 DIAGNOSIS — E785 Hyperlipidemia, unspecified: Secondary | ICD-10-CM

## 2019-10-11 ENCOUNTER — Encounter: Payer: Self-pay | Admitting: Family

## 2019-10-11 ENCOUNTER — Inpatient Hospital Stay: Payer: 59

## 2019-10-11 ENCOUNTER — Other Ambulatory Visit: Payer: Self-pay

## 2019-10-11 ENCOUNTER — Inpatient Hospital Stay: Payer: 59 | Attending: Family | Admitting: Family

## 2019-10-11 VITALS — BP 137/97 | HR 95 | Temp 97.3°F | Resp 18 | Ht 64.0 in | Wt 170.1 lb

## 2019-10-11 DIAGNOSIS — N62 Hypertrophy of breast: Secondary | ICD-10-CM | POA: Diagnosis not present

## 2019-10-11 DIAGNOSIS — N6092 Unspecified benign mammary dysplasia of left breast: Secondary | ICD-10-CM | POA: Diagnosis present

## 2019-10-11 DIAGNOSIS — N6099 Unspecified benign mammary dysplasia of unspecified breast: Secondary | ICD-10-CM | POA: Diagnosis not present

## 2019-10-11 DIAGNOSIS — Z8543 Personal history of malignant neoplasm of ovary: Secondary | ICD-10-CM | POA: Diagnosis not present

## 2019-10-11 DIAGNOSIS — Z7984 Long term (current) use of oral hypoglycemic drugs: Secondary | ICD-10-CM | POA: Insufficient documentation

## 2019-10-11 DIAGNOSIS — D270 Benign neoplasm of right ovary: Secondary | ICD-10-CM | POA: Diagnosis not present

## 2019-10-11 DIAGNOSIS — Z9889 Other specified postprocedural states: Secondary | ICD-10-CM

## 2019-10-11 LAB — CBC WITH DIFFERENTIAL (CANCER CENTER ONLY)
Abs Immature Granulocytes: 0.04 10*3/uL (ref 0.00–0.07)
Basophils Absolute: 0.1 10*3/uL (ref 0.0–0.1)
Basophils Relative: 1 %
Eosinophils Absolute: 0.4 10*3/uL (ref 0.0–0.5)
Eosinophils Relative: 4 %
HCT: 42 % (ref 36.0–46.0)
Hemoglobin: 13.8 g/dL (ref 12.0–15.0)
Immature Granulocytes: 1 %
Lymphocytes Relative: 36 %
Lymphs Abs: 3.1 10*3/uL (ref 0.7–4.0)
MCH: 29.5 pg (ref 26.0–34.0)
MCHC: 32.9 g/dL (ref 30.0–36.0)
MCV: 89.7 fL (ref 80.0–100.0)
Monocytes Absolute: 0.5 10*3/uL (ref 0.1–1.0)
Monocytes Relative: 6 %
Neutro Abs: 4.7 10*3/uL (ref 1.7–7.7)
Neutrophils Relative %: 52 %
Platelet Count: 322 10*3/uL (ref 150–400)
RBC: 4.68 MIL/uL (ref 3.87–5.11)
RDW: 13.3 % (ref 11.5–15.5)
WBC Count: 8.7 10*3/uL (ref 4.0–10.5)
nRBC: 0 % (ref 0.0–0.2)

## 2019-10-11 LAB — CMP (CANCER CENTER ONLY)
ALT: 38 U/L (ref 0–44)
AST: 23 U/L (ref 15–41)
Albumin: 4.6 g/dL (ref 3.5–5.0)
Alkaline Phosphatase: 86 U/L (ref 38–126)
Anion gap: 10 (ref 5–15)
BUN: 13 mg/dL (ref 6–20)
CO2: 31 mmol/L (ref 22–32)
Calcium: 10.1 mg/dL (ref 8.9–10.3)
Chloride: 100 mmol/L (ref 98–111)
Creatinine: 0.89 mg/dL (ref 0.44–1.00)
GFR, Est AFR Am: >60 mL/min (ref >60)
GFR, Estimated: >60 mL/min (ref >60)
Glucose, Bld: 150 mg/dL — ABNORMAL HIGH (ref 70–99)
Potassium: 4.2 mmol/L (ref 3.5–5.1)
Sodium: 141 mmol/L (ref 135–145)
Total Bilirubin: 0.4 mg/dL (ref 0.3–1.2)
Total Protein: 7.5 g/dL (ref 6.5–8.1)

## 2019-10-11 NOTE — Progress Notes (Signed)
Hematology and Oncology Follow Up Visit  Darcee Dekker [id number] 1970-11-18 49 y.o. 10/11/2019   Principle Diagnosis:  1. History of Sertoli-Leydig tumor of the left ovary  2. Strong family history of breast and ovarian cancer - BRCA 1/2 and p53 negative 3. 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 Delpozo is here today for follow-up. She had clips placed in both the left and right breasts in December of last year after an abnormal mass was noted on breast MRI.  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 Pathology report again showed ductal hyperplasia.  I spoke with Dr. Marin Olp about this and he states that he will consider a possible preventative/maintenance treatment.   Her incision along the left areola has healed nicely. Redness, edema or drainage noted on exam.  No fever, chills, n/v, cough, rash, dizziness, SOB, chest pain, abdominal pain or changes in bowel or bladder habits.  Stool is loose at times on Metformin.  She has occasional hot flashes.  She has had episodes of fast heart rate with little exertion (walking for exercise). She notes palpitations but is otherwise asymptomatic. She has seen Dr. Shirlee More in the past and plans to follow-up with him.  No swelling, tenderness, numbness or tingling in her extremities.  No falls or syncope.  She has maintained a good appetite and is staying well hydrated. Her weight is stable.   ECOG Performance Status: 1 - Symptomatic but  completely ambulatory  Medications:  Allergies as of 10/11/2019      Reactions   Latex Rash   Causes blisters   Lidocaine Nausea And Vomiting   Peanut-containing Drug Products Anaphylaxis   Sulfa Antibiotics Rash, Shortness Of Breath   Shortness of breath, rash Shortness of breath, rash   Adhesive [tape]    Reglan [metoclopramide] Tinitus   Ciprofloxacin Rash   Sulfur Rash      Medication List       Accurate as of October 11, 2019 11:20 AM. If you have any questions, ask your nurse or doctor.        aspirin 81 MG tablet Take 81 mg by mouth daily.   betamethasone valerate ointment 0.1 % Commonly known as: VALISONE Apply 1 application topically 2 (two) times daily.   EPINEPHrine 0.3 mg/0.3 mL Soaj injection Commonly known as: EPI-PEN Inject once into muscle for any allergic reaction to peanut exposure   ezetimibe 10 MG tablet Commonly known as: ZETIA TAKE ONE TABLET BY MOUTH ONE TIME DAILY   glucose blood test strip Commonly known as: OneTouch Verio USE TO TEST ONE TIME DAILY AS DIRECTED   lidocaine 5 % ointment Commonly known as: XYLOCAINE Apply 1 application topically 4 (four) times daily as needed.   metFORMIN 500 MG tablet Commonly known as: GLUCOPHAGE Take 500 mg by mouth daily with breakfast. Take half once a day   metFORMIN 500 MG 24 hr tablet Commonly known as: GLUCOPHAGE-XR TAKE TWO TABLETS BY MOUTH TWICE A DAY   omeprazole 20 MG capsule Commonly known as: PRILOSEC Take 1 capsule (20 mg total) by mouth daily.  ondansetron 8 MG disintegrating tablet Commonly known as: Zofran ODT Take 1 tablet (8 mg total) by mouth every 8 (eight) hours as needed for nausea or vomiting.   onetouch ultrasoft lancets Check blood sugar daily as directed.   rosuvastatin 5 MG tablet Commonly known as: Crestor Take 1 tablet (5 mg total) by mouth daily.   saxagliptin HCl 5 MG Tabs tablet Commonly known as: ONGLYZA Take 1 tablet (5 mg total) by mouth daily.     traMADol 50 MG tablet Commonly known as: ULTRAM Take 1 tablet (50 mg total) by mouth every 6 (six) hours as needed.   vitamin C 500 MG tablet Commonly known as: ASCORBIC ACID Take 500 mg by mouth daily.       Allergies:  Allergies  Allergen Reactions  . Latex Rash    Causes blisters  . Lidocaine Nausea And Vomiting  . Peanut-Containing Drug Products Anaphylaxis  . Sulfa Antibiotics Rash and Shortness Of Breath    Shortness of breath, rash Shortness of breath, rash  . Adhesive [Tape]   . Reglan [Metoclopramide] Tinitus  . Ciprofloxacin Rash  . Sulfur Rash    Past Medical History, Surgical history, Social history, and Family History were reviewed and updated.  Review of Systems: All other 10 point review of systems is negative.   Physical Exam:  vitals were not taken for this visit.   Wt Readings from Last 3 Encounters:  09/13/19 172 lb 6.4 oz (78.2 kg)  06/21/19 164 lb 9.6 oz (74.7 kg)  06/09/19 162 lb (73.5 kg)    Ocular: Sclerae unicteric, pupils equal, round and reactive to light Ear-nose-throat: Oropharynx clear, dentition fair Lymphatic: No cervical, supraclavicular or axillary adenopathy Lungs no rales or rhonchi, good excursion bilaterally Heart regular rate and rhythm, no murmur appreciated Abd soft, nontender, positive bowel sounds, no liver or spleen tip palpated on exam, no fluid wave  MSK no focal spinal tenderness, no joint edema Neuro: non-focal, well-oriented, appropriate affect Breasts: No changes. No mass lesion or rash noted on bilateral breast exam. Radioactive seed excision site along the edge of the left areola is clean, dry and intact. No s/s of infection. No drainage.   Lab Results  Component Value Date   WBC 8.7 10/11/2019   HGB 13.8 10/11/2019   HCT 42.0 10/11/2019   MCV 89.7 10/11/2019   PLT 322 10/11/2019   No results found for: FERRITIN, IRON, TIBC, UIBC, IRONPCTSAT Lab Results  Component Value Date   RBC 4.68 10/11/2019    No results found for: KPAFRELGTCHN, LAMBDASER, KAPLAMBRATIO No results found for: IGGSERUM, IGA, IGMSERUM No results found for: Odetta Pink, SPEI   Chemistry      Component Value Date/Time   NA 138 09/09/2019 1224   NA 144 03/11/2017 0822   K 5.6 (H) 09/09/2019 1224   K 4.5 03/11/2017 0822   CL 103 09/09/2019 1224   CL 104 03/11/2017 0822   CO2 26 09/09/2019 1224   CO2 29 03/11/2017 0822   BUN 13 09/09/2019 1224   BUN 9 03/11/2017 0822   CREATININE 0.73 09/09/2019 1224   CREATININE 0.87 04/12/2019 1128   CREATININE 1.2 03/11/2017 0822      Component Value Date/Time   CALCIUM 9.6 09/09/2019 1224   CALCIUM 9.2 03/11/2017 0822   ALKPHOS 75 06/21/2019 0846   ALKPHOS 65 03/11/2017 0822   AST 26 06/21/2019 0846   AST 20 04/12/2019 1128   ALT 32 06/21/2019 0846  ALT 23 04/12/2019 1128   ALT 25 03/11/2017 0822   BILITOT 0.4 06/21/2019 0846   BILITOT 0.3 04/12/2019 1128       Impression and Plan: Ms. Langbehn is a 49 yo post-menopausal female with a 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  She was diagnosed with stromal hyperplasia of the right breast and ductal hyperplasia of the left breast in December 2020.   She had a left breast lumpectomy with radioactive seed placement in February 2021.  As mentioned above, I spoke with Dr. Marin Olp regarding her tissue pathology results with radioactive seed removal and he is still considering whether or not to treat her prophylacticly for the ductal hyperplasia. We will let her know what he decides.  We will see her back in another 6 weeks.  She will contact our office with any questions or concerns. We can certainly see her sooner if needed.   Laverna Peace, NP 3/23/202111:20 AM

## 2019-10-12 ENCOUNTER — Telehealth: Payer: Self-pay | Admitting: Family

## 2019-10-12 LAB — LUTEINIZING HORMONE: LH: 42.9 m[IU]/mL

## 2019-10-12 LAB — FOLLICLE STIMULATING HORMONE: FSH: 61.2 m[IU]/mL

## 2019-10-12 NOTE — Telephone Encounter (Signed)
No los 3/23

## 2019-10-14 LAB — ESTRADIOL, ULTRA SENS: Estradiol, Sensitive: 5.8 pg/mL

## 2019-10-31 NOTE — Progress Notes (Signed)
Cardiology Office Note:    Date:  11/01/2019   ID:  Carolyn Wilson, DOB [birth year], MRN [id number]  PCP:  Mackie Pai, PA-C  Cardiologist:  Shirlee More, MD    Referring MD: Mackie Pai, PA-C    ASSESSMENT:    1. Chest pain on exertion   2. Tachycardia   3. Diabetes mellitus due to underlying condition with hyperosmolarity without coma, with long-term current use of insulin ([place])   4. Mixed hyperlipidemia    PLAN:    In order of problems listed above:  1. With high risk of diabetes hyperlipidemia and family history ongoing symptoms of exercise intolerance tachycardia and chest pain and abnormal stress echocardiogram 2 years ago advise her undergo further ischemic evaluation cardiac CTA.  And also give [place] a calcium score to decide whether to institute a statin she is in a high risk group diabetes greater than 40 as she has flow-limiting stenosis consider revascularization. 2. Pending cardiac CTA placed on a low-dose of a beta-blocker 3. Diabetes well controlled A1c 6.5% 4. Continue Zetia if calcium score is elevated would benefit from additional therapy with statin.   Next appointment: 6 weeks   Medication Adjustments/Labs and Tests Ordered: Current medicines are reviewed at length with the patient today.  Concerns regarding medicines are outlined above.  No orders of the defined types were placed in this encounter.  No orders of the defined types were placed in this encounter.   Chief Complaint  Patient presents with   Follow-up    History of Present Illness:    Carolyn Wilson is a 49 y.o. 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 Compliance with diet, lifestyle and medications: Yes  In general she has done well but still has intermittent exercise intolerance racing of her heart gradually slows with rest and at times ongoing chest discomfort.  2019 had a stress echo seemingly normal however she had exertional chest  pain at that time advise further evaluation with cardiac CTA.  She returns today to discuss recommendations her heart rate response is excessive with activity and gradually slows she has not had syncope no edema orthopnea shortness of breath.  Her diabetes well controlled and she takes a nonstatin with high risk.  Stress echo 08/26/2017: Study Conclusions  - Stress ECG conclusions: There were no stress arrhythmias or    conduction abnormalities. The stress ECG was negative for    ischemia.  - Staged echo: There was no echocardiographic evidence for    stress-induced ischemia.   Impressions:  - Good exercise tolerance.    Not diagnostic changes on ECG.    Negative echocardiogram    Pt developed chest pain (5/10) at peak exercise that resolved    with rest.    Overall: Grossly no evidence of ischemia noted on the test, but    pt developed chest pain at peak exercise. Please note resting    increased HR with quick and exagerated tachycardic response to    exercise.( 109% max predicted HR achieved).   EKG [facility] 09/09/2019 personally reviewed sinus rhythm normal Recent labs 06/21/2019 cholesterol 220 HDL 44 LDL 141 A1c at target 6.5% creatinine normal 0.89  She continues to have exercise symptoms predominantly rapid heart rate at times exercise intolerance of forces stop and rest although recently she is not having chest pain.  Previously she had exertional chest pain during the stress echo procedure.  We had wanted to do a cardiac CTA and  it was not done during Covid.  With her family history diabetes and ongoing cardiac symptoms I advised her to undergo cardiac CTA.  Options risk and benefits detailed she has no dye allergy and for her rapid heart rates with physical effort I will place her on a low-dose beta-blocker.  Her LDL remains elevated if her calcium score is elevated she will need to take a statin or nonstatin medication in addition to Zetia to control her lipids.  She is  not having shortness of breath edema or syncope. Past Medical History:  Diagnosis Date   Abnormal Pap smear of cervix    h/o colposcopy/biopsy   Allergy    Asthma    HX   Breast cancer ([place]) 2007   Cancer [facility]) [phone]   ovarian   Diabetes mellitus without complication ([place]) [id number]    diagnosed by PCP, [name]    GERD (gastroesophageal reflux disease)    Hyperlipemia    on medication    PONV (postoperative nausea and vomiting)     Past Surgical History:  Procedure Laterality Date   APPENDECTOMY     age [age]   BREAST LUMPECTOMY Left 2008   left breast   CHOLECYSTECTOMY  2000   ENDOMETRIAL ABLATION  2009   LAPAROSCOPIC SALPINGO OOPHERECTOMY Left 1996   with right tubal ligation   RADIOACTIVE SEED GUIDED EXCISIONAL BREAST BIOPSY Left 09/13/2019   Procedure: RADIOACTIVE SEED GUIDED EXCISIONAL LEFT BREAST BIOPSY;  Surgeon: [name], MD;  Location: [place];  Service: General;  Laterality: Left;   TUBAL LIGATION      Current Medications: Current Meds  Medication Sig   aspirin 81 MG tablet Take 81 mg by mouth daily.   EPINEPHrine 0.3 mg/0.3 mL IJ SOAJ injection Inject once into muscle for any allergic reaction to peanut exposure   ezetimibe (ZETIA) 10 MG tablet TAKE ONE TABLET BY MOUTH ONE TIME DAILY   glucose blood (ONETOUCH VERIO) test strip USE TO TEST ONE TIME DAILY AS DIRECTED   Lancets (ONETOUCH ULTRASOFT) lancets Check blood sugar daily as directed.   metFORMIN (GLUCOPHAGE-XR) 500 MG 24 hr tablet TAKE TWO TABLETS BY MOUTH TWICE A DAY   omeprazole (PRILOSEC) 20 MG capsule Take 1 capsule (20 mg total) by mouth daily.   ondansetron (ZOFRAN ODT) 8 MG disintegrating tablet Take 1 tablet (8 mg total) by mouth every 8 (eight) hours as needed for nausea or vomiting.   saxagliptin HCl (ONGLYZA) 5 MG TABS tablet Take 1 tablet (5 mg total) by mouth daily.   vitamin C (ASCORBIC ACID) 500 MG tablet Take 500 mg by mouth daily.       Allergies:   Latex, Lidocaine, Peanut-containing drug products, Sulfa antibiotics, Reglan [metoclopramide], Adhesive [tape], Ciprofloxacin, and Sulfur   Social History   Socioeconomic History   Marital status: Married    Spouse name: Not on file   Number of children: Not on file   Years of education: Not on file   Highest education level: Not on file  Occupational History   Not on file  Tobacco Use   Smoking status: Never Smoker   Smokeless tobacco: Never Used   Tobacco comment: NEVER USED TOBACCO  Substance and Sexual Activity   Alcohol use: Yes    Alcohol/week: 1.0 standard drinks    Types: 1 Standard drinks or equivalent per week   Drug use: No   Sexual activity: Yes    Partners: Male    Birth control/protection: Surgical  Other Topics  Concern   Not on file  Social History Narrative   Not on file   Social Determinants of Health   Financial Resource Strain:    Difficulty of Paying Living Expenses:   Food Insecurity:    Worried About Charity fundraiser in the Last Year:    Arboriculturist in the Last Year:   Transportation Needs:    Film/video editor (Medical):    Lack of Transportation (Non-Medical):   Physical Activity:    Days of Exercise per Week:    Minutes of Exercise per Session:   Stress:    Feeling of Stress :   Social Connections:    Frequency of Communication with Friends and Family:    Frequency of Social Gatherings with Friends and Family:    Attends Religious Services:    Active Member of Clubs or Organizations:    Attends Music therapist:    Marital Status:      Family History: The patient's family history includes Breast cancer in her maternal aunt and maternal grandmother; Cancer in an other family member; Colon cancer in her cousin and father; Diabetes in her mother; Melanoma in her cousin; Ovarian cancer in her cousin and maternal aunt. ROS:   Please see the history of present illness.     All other systems reviewed and are negative.  EKGs/Labs/Other Studies Reviewed:    The following studies were reviewed today:  Recent Labs: 10/11/2019: ALT 38; BUN 13; Creatinine 0.89; Hemoglobin 13.8; Platelet Count 322; Potassium 4.2; Sodium 141  Recent Lipid Panel    Component Value Date/Time   CHOL 220 (H) 06/21/2019 0846   CHOL 185 03/11/2017 0822   TRIG 172.0 (H) 06/21/2019 0846   TRIG 145 03/11/2017 0822   HDL 44.00 06/21/2019 0846   HDL 48 03/11/2017 0822   CHOLHDL 5 06/21/2019 0846   VLDL 34.4 06/21/2019 0846   LDLCALC 141 (H) 06/21/2019 0846   LDLCALC 108 (H) 03/11/2017 0822   LDLDIRECT 115.0 03/09/2018 0730    Physical Exam:    VS:  BP 130/82    Pulse 90    Ht 5\' 4"  (1.626 m)    Wt 172 lb (78 kg)    LMP 04/27/2017 Comment: spotting   SpO2 98%    BMI 29.52 kg/m     Wt Readings from Last 3 Encounters:  11/01/19 172 lb (78 kg)  10/11/19 170 lb 1.3 oz (77.1 kg)  09/13/19 172 lb 6.4 oz (78.2 kg)     GEN:  Well nourished, well developed in no acute distress HEENT: Normal NECK: No JVD; No carotid bruits LYMPHATICS: No lymphadenopathy CARDIAC: RRR, no murmurs, rubs, gallops RESPIRATORY:  Clear to auscultation without rales, wheezing or rhonchi  ABDOMEN: Soft, non-tender, non-distended MUSCULOSKELETAL:  No edema; No deformity  SKIN: Warm and dry NEUROLOGIC:  Alert and oriented x 3 PSYCHIATRIC:  Normal affect    Signed, Shirlee More, MD  11/01/2019 8:45 AM    [place]

## 2019-11-01 ENCOUNTER — Encounter: Payer: Self-pay | Admitting: Cardiology

## 2019-11-01 ENCOUNTER — Ambulatory Visit: Payer: 59 | Admitting: Cardiology

## 2019-11-01 ENCOUNTER — Other Ambulatory Visit: Payer: Self-pay

## 2019-11-01 VITALS — BP 130/82 | HR 90 | Ht 64.0 in | Wt 172.0 lb

## 2019-11-01 DIAGNOSIS — Z794 Long term (current) use of insulin: Secondary | ICD-10-CM

## 2019-11-01 DIAGNOSIS — R079 Chest pain, unspecified: Secondary | ICD-10-CM | POA: Diagnosis not present

## 2019-11-01 DIAGNOSIS — E0800 Diabetes mellitus due to underlying condition with hyperosmolarity without nonketotic hyperglycemic-hyperosmolar coma (NKHHC): Secondary | ICD-10-CM | POA: Diagnosis not present

## 2019-11-01 DIAGNOSIS — E782 Mixed hyperlipidemia: Secondary | ICD-10-CM

## 2019-11-01 DIAGNOSIS — R000 Tachycardia, unspecified: Secondary | ICD-10-CM | POA: Diagnosis not present

## 2019-11-01 DIAGNOSIS — R072 Precordial pain: Secondary | ICD-10-CM

## 2019-11-01 MED ORDER — METOPROLOL SUCCINATE ER 25 MG PO TB24: 25.0000 mg | ORAL_TABLET | Freq: Every day | ORAL | 3 refills | Status: DC

## 2019-11-01 NOTE — Patient Instructions (Signed)
Medication Instructions:  Your physician has recommended you make the following change in your medication:  START: Toprol XL 25 mg take one tablet by mouth daily. *If you need a refill on your cardiac medications before your next appointment, please call your pharmacy*   Lab Work: Your physician recommends that you return for lab work in: TODAY BMP If you have labs (blood work) drawn today and your tests are completely normal, you will receive your results only by: [name] MyChart Message (if you have MyChart) OR . A paper copy in the mail If you have any lab test that is abnormal or we need to change your treatment, we will call you to review the results.   Testing/Procedures: Your cardiac CT will be scheduled at the below location:   [facility] [address] [place], [place] [address] [phone]   If scheduled at [facility], please arrive at the [facility] main entrance of [facility] 30 minutes prior to test start time. Proceed to the [facility] Radiology Department (first floor) to check-in and test prep.   Please follow these instructions carefully (unless otherwise directed):   On the Night Before the Test: . Be sure to Drink plenty of water. . Do not consume any caffeinated/decaffeinated beverages or chocolate 12 hours prior to your test. . Do not take any antihistamines 12 hours prior to your test. . If you take Metformin/Onglyza do not take 24 hours prior to test.  On the Day of the Test: . Drink plenty of water. Do not drink any water within one hour of the test. . Do not eat any food 4 hours prior to the test. . You may take your regular medications prior to the test.  . FEMALES- please wear underwire-free bra if available       After the Test: . Drink plenty of water. . After receiving IV contrast, you may experience a mild flushed feeling. This is normal. . On occasion, you may experience a mild rash up to 24 hours after the  test. This is not dangerous. If this occurs, you can take Benadryl 25 mg and increase your fluid intake. . If you experience trouble breathing, this can be serious. If it is severe call 911 IMMEDIATELY. If it is mild, please call our office. . If you take any of these medications: Onglyza, Glipizide/Metformin, Avandament, Glucavance, please do not take 48 hours after completing test unless otherwise instructed.   Once we have confirmed authorization from your insurance company, we will call you to set up a date and time for your test.   For non-scheduling related questions, please contact the cardiac imaging nurse navigator should you have any questions/concerns: Marchia Bond, RN Navigator Cardiac Imaging [facility] [phone] office  For scheduling needs, including cancellations and rescheduling, please call [phone].      Follow-Up: At [facility], you and your health needs are our priority.  As part of our continuing mission to provide you with exceptional heart care, we have created designated Provider Care Teams.  These Care Teams include your primary Cardiologist (physician) and Advanced Practice Providers (APPs -  Physician Assistants and Nurse Practitioners) who all work together to provide you with the care you need, when you need it.  We recommend signing up for the patient portal called "MyChart".  Sign up information is provided on this After Visit Summary.  MyChart is used to connect with patients for Virtual Visits (Telemedicine).  Patients are able to view lab/test results, encounter notes, upcoming appointments, etc.  Non-urgent messages can be sent to your provider as well.   To learn more about what you can do with MyChart, go to [email or web address].    Your next appointment:   6 week(s)  The format for your next appointment:   In Person  Provider:   Dr. Bettina Gavia   Other Instructions

## 2019-11-02 ENCOUNTER — Telehealth: Payer: Self-pay | Admitting: Emergency Medicine

## 2019-11-02 LAB — BASIC METABOLIC PANEL
BUN/Creatinine Ratio: 18 (ref 9–23)
BUN: 14 mg/dL (ref 6–24)
CO2: 23 mmol/L (ref 20–29)
Calcium: 9.7 mg/dL (ref 8.7–10.2)
Chloride: 103 mmol/L (ref 96–106)
Creatinine, Ser: 0.8 mg/dL (ref 0.57–1.00)
GFR calc Af Amer: 101 mL/min/{1.73_m2} (ref >59)
GFR calc non Af Amer: 87 mL/min/{1.73_m2} (ref >59)
Glucose: 178 mg/dL — ABNORMAL HIGH (ref 65–99)
Potassium: 4.6 mmol/L (ref 3.5–5.2)
Sodium: 142 mmol/L (ref 134–144)

## 2019-11-02 NOTE — Telephone Encounter (Signed)
Left message for patient to return call regarding results  

## 2019-11-08 ENCOUNTER — Encounter: Payer: Self-pay | Admitting: Obstetrics & Gynecology

## 2019-11-08 ENCOUNTER — Other Ambulatory Visit: Payer: Self-pay | Admitting: Obstetrics & Gynecology

## 2019-11-08 ENCOUNTER — Telehealth: Payer: Self-pay | Admitting: Obstetrics & Gynecology

## 2019-11-08 MED ORDER — NONFORMULARY OR COMPOUNDED ITEM: 3 refills | Status: DC

## 2019-11-08 NOTE — Telephone Encounter (Signed)
Visit Follow-Up Question Received: Today Message Contents  [place], [place] "Carolyn Wilson" sent to [place]  Phone Number: [phone]  Good morning  My hot flashes have returned..can you call on the prescription in had last time? Gabi?? Also you had given me something for the vaginal dryness that worked it was picked up at the [facility].  Thanks  Carolyn Wilson

## 2019-11-08 NOTE — Progress Notes (Signed)
v

## 2019-11-08 NOTE — Telephone Encounter (Signed)
PMP Last estradiol level 5.8 on 10/11/19 from Dr Marin Olp. [place] 61.2 LH 42.9 No HRT  Hx Diabetes Last AEX 03/2019.   Spoke with pt. Pt reports having hot flashes and vaginal dryness x 1 week ago that started up again. Pt states only getting 3 hours of sleep every night.  Pt states has used Gabapentin (4-04/2018) in past and Vit E vaginal suppositories in past to help with sx. Pt states using Vit E oil for recent lumpectomy after breast biopsies in 06/2019.  Pt requesting meds be called in to pharmacy on file for Gabapentin to [facility] and Vit E suppositories to [facility] if approved.   Will review with Dr [name] Heck and return call to pt with recommendations.

## 2019-11-08 NOTE — Telephone Encounter (Signed)
Left message for pt to return call to triage RN. 

## 2019-11-08 NOTE — Telephone Encounter (Signed)
Ok to restart the gabapentin 100mg  nightly, increasing every 3 nights.  She has previously been up to 600mg  at night.  I'd really like her to update me at 300mg  before going up further.  Rx for [facility] for Vit E suppositories PV three times weekly.

## 2019-11-09 ENCOUNTER — Telehealth: Payer: Self-pay

## 2019-11-09 MED ORDER — GABAPENTIN 100 MG PO CAPS: 100.0000 mg | ORAL_CAPSULE | Freq: Every day | ORAL | 0 refills | Status: DC

## 2019-11-09 NOTE — Telephone Encounter (Signed)
Pharmacy called in regards to needing clarification on medication directions for Gabapentin.

## 2019-11-09 NOTE — Telephone Encounter (Signed)
Spoke with pt. Pt given recommendations per Dr [name] Heck. Pt agreeable to new Rx and verbalized understanding of instructions.   Rx Gabapentin 100 mg capsules # 60, 0RF sent to pharmacy on file.   Routing to Dr [name] Heck for review.  Encounter closed.

## 2019-11-09 NOTE — Telephone Encounter (Signed)
Spoke to pharmacy tech, Indu. Gave claudication on Gabapentin Rx. Pharmacy agreeable.   Encounter closed.

## 2019-11-12 ENCOUNTER — Encounter: Payer: Self-pay | Admitting: Cardiology

## 2019-11-16 ENCOUNTER — Encounter: Payer: Self-pay | Admitting: Family

## 2019-11-18 ENCOUNTER — Other Ambulatory Visit: Payer: Self-pay

## 2019-11-18 ENCOUNTER — Encounter: Payer: Self-pay | Admitting: Hematology & Oncology

## 2019-11-18 ENCOUNTER — Inpatient Hospital Stay: Payer: 59 | Attending: Family

## 2019-11-18 ENCOUNTER — Inpatient Hospital Stay (HOSPITAL_BASED_OUTPATIENT_CLINIC_OR_DEPARTMENT_OTHER): Payer: 59 | Admitting: Hematology & Oncology

## 2019-11-18 VITALS — BP 124/81 | HR 85 | Temp 97.1°F | Resp 20 | Wt 168.0 lb

## 2019-11-18 DIAGNOSIS — Z7982 Long term (current) use of aspirin: Secondary | ICD-10-CM | POA: Diagnosis not present

## 2019-11-18 DIAGNOSIS — Z8543 Personal history of malignant neoplasm of ovary: Secondary | ICD-10-CM | POA: Insufficient documentation

## 2019-11-18 DIAGNOSIS — E119 Type 2 diabetes mellitus without complications: Secondary | ICD-10-CM | POA: Insufficient documentation

## 2019-11-18 DIAGNOSIS — Z8041 Family history of malignant neoplasm of ovary: Secondary | ICD-10-CM

## 2019-11-18 DIAGNOSIS — Z7984 Long term (current) use of oral hypoglycemic drugs: Secondary | ICD-10-CM | POA: Diagnosis not present

## 2019-11-18 DIAGNOSIS — D270 Benign neoplasm of right ovary: Secondary | ICD-10-CM

## 2019-11-18 DIAGNOSIS — N6092 Unspecified benign mammary dysplasia of left breast: Secondary | ICD-10-CM | POA: Diagnosis not present

## 2019-11-18 DIAGNOSIS — N6099 Unspecified benign mammary dysplasia of unspecified breast: Secondary | ICD-10-CM

## 2019-11-18 LAB — CMP (CANCER CENTER ONLY)
ALT: 45 U/L — ABNORMAL HIGH (ref 0–44)
AST: 26 U/L (ref 15–41)
Albumin: 4.3 g/dL (ref 3.5–5.0)
Alkaline Phosphatase: 74 U/L (ref 38–126)
Anion gap: 8 (ref 5–15)
BUN: 12 mg/dL (ref 6–20)
CO2: 31 mmol/L (ref 22–32)
Calcium: 9.8 mg/dL (ref 8.9–10.3)
Chloride: 101 mmol/L (ref 98–111)
Creatinine: 0.9 mg/dL (ref 0.44–1.00)
GFR, Est AFR Am: >60 mL/min (ref >60)
GFR, Estimated: >60 mL/min (ref >60)
Glucose, Bld: 191 mg/dL — ABNORMAL HIGH (ref 70–99)
Potassium: 4.4 mmol/L (ref 3.5–5.1)
Sodium: 140 mmol/L (ref 135–145)
Total Bilirubin: 0.5 mg/dL (ref 0.3–1.2)
Total Protein: 7.1 g/dL (ref 6.5–8.1)

## 2019-11-18 LAB — CBC WITH DIFFERENTIAL (CANCER CENTER ONLY)
Abs Immature Granulocytes: 0.03 10*3/uL (ref 0.00–0.07)
Basophils Absolute: 0.1 10*3/uL (ref 0.0–0.1)
Basophils Relative: 1 %
Eosinophils Absolute: 0.2 10*3/uL (ref 0.0–0.5)
Eosinophils Relative: 3 %
HCT: 41.4 % (ref 36.0–46.0)
Hemoglobin: 13.7 g/dL (ref 12.0–15.0)
Immature Granulocytes: 0 %
Lymphocytes Relative: 37 %
Lymphs Abs: 2.9 10*3/uL (ref 0.7–4.0)
MCH: 30 pg (ref 26.0–34.0)
MCHC: 33.1 g/dL (ref 30.0–36.0)
MCV: 90.6 fL (ref 80.0–100.0)
Monocytes Absolute: 0.5 10*3/uL (ref 0.1–1.0)
Monocytes Relative: 6 %
Neutro Abs: 4.2 10*3/uL (ref 1.7–7.7)
Neutrophils Relative %: 53 %
Platelet Count: 302 10*3/uL (ref 150–400)
RBC: 4.57 MIL/uL (ref 3.87–5.11)
RDW: 12.8 % (ref 11.5–15.5)
WBC Count: 7.9 10*3/uL (ref 4.0–10.5)
nRBC: 0 % (ref 0.0–0.2)

## 2019-11-18 MED ORDER — RALOXIFENE HCL 60 MG PO TABS: 60.0000 mg | ORAL_TABLET | Freq: Every day | ORAL | 6 refills | Status: DC

## 2019-11-18 NOTE — Progress Notes (Signed)
Hematology and Oncology Follow Up Visit  Carolyn Wilson [id number] 1971-01-30 49 y.o. 11/18/2019   Principle Diagnosis:  1. History of Sertoli-Leydig tumor of the left ovary  2. Strong family history of breast and ovarian cancer - BRCA 1/2 and p53 negative 3. 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  We have had her come in a little bit earlier this visit.  Back in February, she had an abnormal mammogram with the left breast.  She actually underwent a lumpectomy.  The pathology report ([id number]) showed fibrocystic change and usual ductal hyperplasia.  I was a little bit worried given her history.  I felt that this was a "marker" for the possibility of breast cancer development.  She is doing okay otherwise.  Her weight is about the same.  She does have diabetes.  She is on Metformin.  She does have hot flashes and sweats.  She is on gabapentin for this.  I do not see any contraindication to treating her with Evista.  I think this would be a reasonable way of trying to help prevent any type of development of invasive breast cancer.  She is active.  I would not think that osteoporosis will be a problem for her.  Of note, she is on baby aspirin already.  I know that with Evista there is at risk of blood clots.  Overall, I would have to say that her performance status is ECOG 0.    Medications:  Allergies as of 11/18/2019      Reactions   Latex Rash   Causes blisters   Lidocaine Nausea And Vomiting   Peanut-containing Drug Products Anaphylaxis   Sulfa Antibiotics Shortness Of Breath, Rash   Reglan [metoclopramide] Tinitus   Adhesive [tape] Rash   Ciprofloxacin Rash   Sulfur Rash      Medication List         Accurate as of November 18, 2019  8:53 AM. If you have any questions, ask your nurse or doctor.        aspirin 81 MG tablet Take 81 mg by mouth daily.   EPINEPHrine 0.3 mg/0.3 mL Soaj injection Commonly known as: EPI-PEN Inject once into muscle for any allergic reaction to peanut exposure   ezetimibe 10 MG tablet Commonly known as: ZETIA TAKE ONE TABLET BY MOUTH ONE TIME DAILY   gabapentin 100 MG capsule Commonly known as: Neurontin Take 1 capsule (100 mg total) by mouth at bedtime. Increase by  1 capsule (190m) every 3 nights. What changed:   how much to take  additional instructions   glucose blood test strip Commonly known as: OneTouch Verio USE TO TEST ONE TIME DAILY AS DIRECTED   metFORMIN 500 MG 24 hr tablet Commonly known as: GLUCOPHAGE-XR TAKE TWO TABLETS BY MOUTH TWICE A DAY What changed:   how much to take  how to take this   metoprolol succinate 25 MG 24 hr tablet Commonly known as: Toprol XL Take 1 tablet (25 mg total) by mouth daily.   NONFORMULARY OR COMPOUNDED ITEM Vitamin E vaginal suppositories 200u/ml.  One pv three times weekly.   omeprazole 20 MG capsule Commonly known as: PRILOSEC Take 1 capsule (20 mg total) by mouth daily.  ondansetron 8 MG disintegrating tablet Commonly known as: Zofran ODT Take 1 tablet (8 mg total) by mouth every 8 (eight) hours as needed for nausea or vomiting.   onetouch ultrasoft lancets Check blood sugar daily as directed.   saxagliptin HCl 5 MG Tabs tablet Commonly known as: ONGLYZA Take 1 tablet (5 mg total) by mouth daily.   vitamin C 500 MG tablet Commonly known as: ASCORBIC ACID Take 500 mg by mouth daily.       Allergies:  Allergies  Allergen Reactions  . Latex Rash    Causes blisters  . Lidocaine Nausea And Vomiting  . Peanut-Containing Drug Products Anaphylaxis  . Sulfa Antibiotics Shortness Of Breath and Rash  . Reglan [Metoclopramide] Tinitus  . Adhesive [Tape] Rash  .  Ciprofloxacin Rash  . Sulfur Rash    Past Medical History, Surgical history, Social history, and Family History were reviewed and updated.  Review of Systems: Review of Systems  Constitutional: Negative.   HENT: Negative.   Eyes: Negative.   Respiratory: Negative.   Cardiovascular: Negative.   Gastrointestinal: Negative.   Genitourinary: Negative.   Musculoskeletal: Negative.   Skin: Negative.   Neurological: Negative.   Endo/Heme/Allergies: Negative.   Psychiatric/Behavioral: Negative.    [name]   Physical Exam:  weight is 168 lb (76.2 kg). Her temporal temperature is 97.1 F (36.2 C) (abnormal). Her blood pressure is 124/81 and her pulse is 85. Her respiration is 20 and oxygen saturation is 100%.   Wt Readings from Last 3 Encounters:  11/18/19 168 lb (76.2 kg)  11/01/19 172 lb (78 kg)  10/11/19 170 lb 1.3 oz (77.1 kg)    Physical Exam Vitals reviewed.  Constitutional:      Comments: Her breast exam shows right breast with no masses, edema or erythema.  There is no right axillary adenopathy.  Left breast shows the lumpectomy scar at the edge of the areola at 3:00.  There is no erythema or warmth.  There is no discharge noted.  There is no tenderness to palpation.  There is no left axillary adenopathy.  HENT:     Head: Normocephalic and atraumatic.  Eyes:     Pupils: Pupils are equal, round, and reactive to light.  Cardiovascular:     Rate and Rhythm: Normal rate and regular rhythm.     Heart sounds: Normal heart sounds.  Pulmonary:     Effort: Pulmonary effort is normal.     Breath sounds: Normal breath sounds.  Abdominal:     General: Bowel sounds are normal.     Palpations: Abdomen is soft.  Musculoskeletal:        General: No tenderness or deformity. Normal range of motion.     Cervical back: Normal range of motion.  Lymphadenopathy:     Cervical: No cervical adenopathy.  Skin:    General: Skin is warm and dry.     Findings: No erythema or rash.  Neurological:      Mental Status: She is alert and oriented to person, place, and time.  Psychiatric:        Behavior: Behavior normal.        Thought Content: Thought content normal.        Judgment: Judgment normal.      Lab Results  Component Value Date   WBC 7.9 11/18/2019   HGB 13.7 11/18/2019   HCT 41.4 11/18/2019   MCV 90.6 11/18/2019   PLT 302 11/18/2019   No results found for: FERRITIN, IRON, TIBC,  UIBC, IRONPCTSAT Lab Results  Component Value Date   RBC 4.57 11/18/2019   No results found for: KPAFRELGTCHN, LAMBDASER, KAPLAMBRATIO No results found for: IGGSERUM, IGA, IGMSERUM No results found for: Odetta Pink, SPEI   Chemistry      Component Value Date/Time   NA 140 11/18/2019 0808   NA 142 11/01/2019 0902   NA 144 03/11/2017 0822   K 4.4 11/18/2019 0808   K 4.5 03/11/2017 0822   CL 101 11/18/2019 0808   CL 104 03/11/2017 0822   CO2 31 11/18/2019 0808   CO2 29 03/11/2017 0822   BUN 12 11/18/2019 0808   BUN 14 11/01/2019 0902   BUN 9 03/11/2017 0822   CREATININE 0.90 11/18/2019 0808   CREATININE 1.2 03/11/2017 0822      Component Value Date/Time   CALCIUM 9.8 11/18/2019 0808   CALCIUM 9.2 03/11/2017 0822   ALKPHOS 74 11/18/2019 0808   ALKPHOS 65 03/11/2017 0822   AST 26 11/18/2019 0808   ALT 45 (H) 11/18/2019 0808   ALT 25 03/11/2017 0822   BILITOT 0.5 11/18/2019 0808       Impression and Plan: Ms. 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   She was diagnosed with stromal hyperplasia of the right breast and ductal hyperplasia of the left breast in December 2020.    She had a left breast lumpectomy with radioactive seed placement in February 2021.   Again, I think that it would be reasonable to put her on E Vista to help as a preventive measure for breast cancer.  I really do not see that there is much of  a downside to doing this.  She is on aspirin.  She is active.  I realize she is does have the diabetes but this seems to be under fairly decent control.  I would like to have her come back in 3 months so we can just follow-up with her.  If everything looks okay, then we will move her appointments out to her regular time.   Volanda Napoleon, MD 4/30/20218:53 AM

## 2019-11-19 ENCOUNTER — Other Ambulatory Visit: Payer: Self-pay | Admitting: Medical

## 2019-11-21 ENCOUNTER — Encounter: Payer: Self-pay | Admitting: Obstetrics & Gynecology

## 2019-11-21 ENCOUNTER — Other Ambulatory Visit: Payer: Self-pay | Admitting: Obstetrics & Gynecology

## 2019-11-21 ENCOUNTER — Telehealth: Payer: Self-pay | Admitting: *Deleted

## 2019-11-21 NOTE — Telephone Encounter (Signed)
If she is taking the 300mg  dosing, it is ok to increase by 100mg  every three nights up to 600mg .  She can increase more slowly if she desires.  Please confirm her dosage.  As it is helping, I would not recommend lowering the dosage at this point.

## 2019-11-21 NOTE — Telephone Encounter (Signed)
Stank[place], [employer] "[name]"  [place] [facility]  Phone Number: [phone]  Good Morning.  The gabipentin seems to be helping. I can at least sleep for a few hours now. Should I keep taking the 3 at night or see if they two at night will work?  Thanks  Carolyn Wilson

## 2019-11-21 NOTE — Telephone Encounter (Signed)
Patient restarted on Gabapentin on 11/08/19 for vasomotor symptoms.   There is also a open refill request from [facility] for Gabapentin.   Dr. [name] Heck -see patient message below and advise on reducing dosage and please advise on refill of gabapentin.

## 2019-11-21 NOTE — Telephone Encounter (Signed)
Encounter closed

## 2019-11-21 NOTE — Telephone Encounter (Signed)
Spoke with patient. Confirmed patient is taking Gabapentin 300 mg nightly. Advised per Dr. [name] Heck. Patient request to continue current dosage at 300 mg nightly, requesting new Rx. Pharmacy confirmed. Advised will provide update to Dr. [name] Heck. Patient is requesting 300mg  capsule.  Rx pended for Gabapentin 300 mg ( 1 capsule) PO qhs.   Routing to Dr. [name] Heck to advise on refill.

## 2019-11-21 NOTE — Telephone Encounter (Signed)
See open telephone encounter dated 11/21/19

## 2019-11-22 ENCOUNTER — Encounter: Payer: Self-pay | Admitting: Obstetrics & Gynecology

## 2019-11-22 ENCOUNTER — Inpatient Hospital Stay: Payer: 59 | Admitting: Hematology & Oncology

## 2019-11-22 ENCOUNTER — Inpatient Hospital Stay: Payer: 59

## 2019-11-22 NOTE — Telephone Encounter (Signed)
Visit Follow-Up Question Received: Today Message Contents  Freshour, Mayerlin Dorner "[name]" sent to [facility]  Phone Number: [phone]  I received a notification on my chart said it was time to scheduled a mammogram. Is it really time? Lol

## 2019-11-23 ENCOUNTER — Encounter: Payer: Self-pay | Admitting: Obstetrics & Gynecology

## 2019-11-23 NOTE — Telephone Encounter (Signed)
Dr. [name] Heck -please see update on gabapentin and advise on RX.

## 2019-11-23 NOTE — Telephone Encounter (Signed)
My chart message sent to pt regarding her question.   Please close encounter.

## 2019-11-24 MED ORDER — GABAPENTIN 300 MG PO CAPS: 300.0000 mg | ORAL_CAPSULE | Freq: Every day | ORAL | 2 refills | Status: DC

## 2019-11-24 NOTE — Telephone Encounter (Signed)
Rx was signed for gabapentin 300mg  nightly.  #90/2RF.  Pt advised ok to wait 6 months after last MRI for [facility].  She's had so many tests/biopsies done in the past year.  Ok to close encounter.

## 2019-11-24 NOTE — Telephone Encounter (Signed)
Call to patient to notify of Rx. Left detailed message, ok per dpr. Advised of Rx as seen below per Dr. [name] Heck. Return call to office if any additional questions.

## 2019-11-25 ENCOUNTER — Encounter: Payer: Self-pay | Admitting: Medical

## 2019-11-25 MED ORDER — SAXAGLIPTIN HCL 5 MG PO TABS: 5.0000 mg | ORAL_TABLET | Freq: Every day | ORAL | 5 refills | Status: DC

## 2019-12-02 ENCOUNTER — Telehealth (HOSPITAL_COMMUNITY): Payer: Self-pay | Admitting: *Deleted

## 2019-12-02 ENCOUNTER — Telehealth: Payer: Self-pay

## 2019-12-02 NOTE — Telephone Encounter (Signed)
Reaching out to patient to offer assistance regarding upcoming cardiac imaging study; pt verbalizes understanding of appt date/time, parking situation and where to check in, pre-test NPO status and medications ordered, and verified current allergies; name and call back number provided for further questions should they arise  [facility] [phone] office [phone] cell  Pt instructed to not take her metoprolol on day of test and [facility]  Pt instructed to to take ivabradine 2 hours prior to scan.  Pt states she is a difficult IV stick.

## 2019-12-02 NOTE — Telephone Encounter (Signed)
Gave Patient 2 - 5 mg ivabradine sample in envelope -placed at the screening table for pick up  Lot number  S/n  [id number] Lot [id number] exp 11/21 [id number]

## 2019-12-05 ENCOUNTER — Ambulatory Visit (HOSPITAL_COMMUNITY)
Admission: RE | Admit: 2019-12-05 | Discharge: 2019-12-05 | Disposition: A | Discharge status: Home / Self Care | Payer: 59 | Source: Ambulatory Visit | Attending: Cardiology | Admitting: Cardiology

## 2019-12-05 ENCOUNTER — Other Ambulatory Visit: Payer: Self-pay

## 2019-12-05 DIAGNOSIS — R072 Precordial pain: Secondary | ICD-10-CM | POA: Diagnosis not present

## 2019-12-05 DIAGNOSIS — R079 Chest pain, unspecified: Secondary | ICD-10-CM | POA: Insufficient documentation

## 2019-12-05 MED ORDER — METOPROLOL TARTRATE 5 MG/5ML IV SOLN
INTRAVENOUS | Status: AC
  Filled 2019-12-05: qty 15

## 2019-12-05 MED ORDER — METOPROLOL TARTRATE 5 MG/5ML IV SOLN
5.0000 mg | INTRAVENOUS | Status: DC | PRN
  Administered 2019-12-05: 5 mg via INTRAVENOUS

## 2019-12-05 MED ORDER — NITROGLYCERIN 0.4 MG SL SUBL
0.8000 mg | SUBLINGUAL_TABLET | Freq: Once | SUBLINGUAL | Status: AC
  Administered 2019-12-05: 0.8 mg via SUBLINGUAL

## 2019-12-05 MED ORDER — IOHEXOL 350 MG/ML SOLN
80.0000 mL | Freq: Once | INTRAVENOUS | Status: AC | PRN
  Administered 2019-12-05: 80 mL via INTRAVENOUS

## 2019-12-05 MED ORDER — NITROGLYCERIN 0.4 MG SL SUBL
SUBLINGUAL_TABLET | SUBLINGUAL | Status: AC
  Filled 2019-12-05: qty 2

## 2019-12-06 ENCOUNTER — Other Ambulatory Visit: Payer: Self-pay | Admitting: Medical

## 2019-12-07 ENCOUNTER — Encounter: Payer: Self-pay | Admitting: Medical

## 2019-12-07 ENCOUNTER — Telehealth: Payer: Self-pay

## 2019-12-07 NOTE — Telephone Encounter (Signed)
-----   Message from Richardo Priest, MD sent at 12/07/2019 11:03 AM EDT ----- Normal or stable result  Her cardiac CTA is normal no coronary atherosclerosis calcium score is 0 no blockage of arteries.  She does appear to have a problem with her esophagus with swallowing noticed with an air-fluid level in the esophagus and she may want to discuss this with her family physician or see GI if she is having chest pain as not infrequently esophageal problems masquerade as heart pain

## 2019-12-07 NOTE — Telephone Encounter (Signed)
Spoke with patient regarding results and recommendation. Patient states that she will make an appointment to see her PCP in regards to her esophagus.   Patient verbalizes understanding and is agreeable to plan of care. Advised patient to call back with any issues or concerns.

## 2019-12-24 ENCOUNTER — Other Ambulatory Visit: Payer: Self-pay | Admitting: Medical

## 2019-12-26 ENCOUNTER — Telehealth: Payer: Self-pay | Admitting: *Deleted

## 2019-12-26 DIAGNOSIS — Z9189 Other specified personal risk factors, not elsewhere classified: Secondary | ICD-10-CM

## 2019-12-26 DIAGNOSIS — Z803 Family history of malignant neoplasm of breast: Secondary | ICD-10-CM

## 2019-12-26 DIAGNOSIS — N6310 Unspecified lump in the right breast, unspecified quadrant: Secondary | ICD-10-CM

## 2019-12-26 DIAGNOSIS — N6320 Unspecified lump in the left breast, unspecified quadrant: Secondary | ICD-10-CM

## 2019-12-26 DIAGNOSIS — R928 Other abnormal and inconclusive findings on diagnostic imaging of breast: Secondary | ICD-10-CM

## 2019-12-26 DIAGNOSIS — N6099 Unspecified benign mammary dysplasia of unspecified breast: Secondary | ICD-10-CM

## 2019-12-26 DIAGNOSIS — N6489 Other specified disorders of breast: Secondary | ICD-10-CM

## 2019-12-26 NOTE — Telephone Encounter (Signed)
Spoke with patient. Advised per Dr. [name] Heck, patient is agreeable to proceed with Breast MRI at [facility]. Order placed. Patient is aware she will be contacted directly by [facility] to schedule, our office will then precert once scheduled. Patient verbalizes understanding and is agreeable.   MRI Breast bilateral w/wo contrast ordered.   Routing to provider for final review. Patient is agreeable to disposition. Will close encounter.  Patient placed in IMG hold.   Cc: Hayley Carder

## 2019-12-26 NOTE — Telephone Encounter (Signed)
-----   Message from Megan Salon, MD sent at 12/21/2019  4:28 PM EDT ----- Regarding: breast MRI Pt is due follow up breast MRI in June.  Could you reach out to her and see if she is ready to schedule.  She has MRI in December that was abnormal. Had a biopsy and then lumpectomy showing ductal hyperplasia.  She has a high risk for breast cancer--26.5% lifetime risk.  She has two MRI guided biopsies in 2019 and then the one in 2020 with lumpectomy.  6 month follow up breast MRI was recommended.  So this isn't a screening MRI.  It is a follow up MRI.  Thanks.  Vinnie Level

## 2019-12-26 NOTE — Telephone Encounter (Signed)
Left message to call Oree Mirelez, RN at [facility] [phone].   

## 2019-12-27 ENCOUNTER — Ambulatory Visit: Payer: 59 | Admitting: Cardiology

## 2019-12-28 ENCOUNTER — Other Ambulatory Visit: Payer: Self-pay | Admitting: Obstetrics & Gynecology

## 2019-12-28 DIAGNOSIS — Z1231 Encounter for screening mammogram for malignant neoplasm of breast: Secondary | ICD-10-CM

## 2019-12-29 NOTE — Progress Notes (Signed)
Cardiology Office Note:    Date:  12/30/2019   ID:  Carolyn Wilson, DOB 07/07/1971, MRN [id number]  PCP:  Mackie Pai, PA-C  Cardiologist:  Shirlee More, MD    Referring MD: Mackie Pai, PA-C    ASSESSMENT:    1. Chest pain on exertion   2. Diabetes mellitus due to underlying condition with hyperosmolarity without coma, with long-term current use of insulin ([address])   3. Mixed hyperlipidemia   4. Statin intolerance    PLAN:    In order of problems listed above:  1. Unlikely that her chest pain is due to coronary disease or microvascular coronary disease I did tell her she may benefit from follow-up CT calcium score in 5 to 7 years. 2. High risk with diabetes age greater than 66 and elevated baseline LDL of 140 and remain on her nonstatin she will follow up labs with her PCP and she is statin intolerant. 3. If having GI symptoms may benefit from evaluation   Next appointment: As needed  Medication Adjustments/Labs and Tests Ordered: Current medicines are reviewed at length with the patient today.  Concerns regarding medicines are outlined above.  No orders of the defined types were placed in this encounter.  No orders of the defined types were placed in this encounter.   Chief Complaint  Patient presents with  . Follow-up    After cardiac CTA    History of Present Illness:    Carolyn Wilson is a 49 y.o. female with a hx of type 2 diabetes and hypertension last seen 11/01/1999 with chest pain.. Compliance with diet, lifestyle and medications: Yes she follows a good lifestyle  For evaluation she will undergo underwent cardiac CTA which shows no atherosclerosis and normal coronary vessels. She did have an air-fluid level in her esophagus but is not having esophageal symptoms. Despite a calcium score of 0 she is in a high risk group with diabetes and age I asked to remain on her nonstatin Zetia that she tolerates and she plans to have follow-up labs with  her PCP. She is statin intolerant. She feels the beta-blocker had been beneficial we will continue it and give her twice daily preparation and she prefers to take that over long-acting  12/06/2019  IMPRESSION: 1. No evidence of CAD, CADRADS = 0. 2. Coronary calcium score of 0. This was 0 percentile for age and sex matched control. 3. Normal coronary origin with right dominance.  Past Medical History:  Diagnosis Date  . Abnormal Pap smear of cervix    h/o colposcopy/biopsy  . Allergy   . Asthma    HX  . Breast cancer ([place]) 2007  . Cancer ([place]) [id number]   ovarian  . Diabetes mellitus without complication ([place]) 44/3154    diagnosed by PCP, [name]   . GERD (gastroesophageal reflux disease)   . Hyperlipemia    on medication   . PONV (postoperative nausea and vomiting)     Past Surgical History:  Procedure Laterality Date  . APPENDECTOMY     age 18-9  . BREAST LUMPECTOMY Left 2008   left breast  . CHOLECYSTECTOMY  2000  . ENDOMETRIAL ABLATION  2009  . LAPAROSCOPIC SALPINGO OOPHERECTOMY Left 1996   with right tubal ligation  . RADIOACTIVE SEED GUIDED EXCISIONAL BREAST BIOPSY Left 09/13/2019   Procedure: RADIOACTIVE SEED GUIDED EXCISIONAL LEFT BREAST BIOPSY;  Surgeon: [name], MD;  Location: [place];  Service: General;  Laterality: Left;  . TUBAL LIGATION  Current Medications: Current Meds  Medication Sig  . aspirin 81 MG tablet Take 81 mg by mouth daily.  [name] EPINEPHrine 0.3 mg/0.3 mL IJ SOAJ injection Inject once into muscle for any allergic reaction to peanut exposure  . ezetimibe (ZETIA) 10 MG tablet TAKE ONE TABLET BY MOUTH ONE TIME DAILY  . gabapentin (NEURONTIN) 300 MG capsule Take 1 capsule (300 mg total) by mouth at bedtime.  . Lancets (ONETOUCH ULTRASOFT) lancets Check blood sugar daily as directed.  . metFORMIN (GLUCOPHAGE-XR) 500 MG 24 hr tablet TAKE TWO TABLETS BY MOUTH TWICE A DAY  . metoprolol succinate (TOPROL XL) 25 MG 24  hr tablet Take 1 tablet (25 mg total) by mouth daily.  . NONFORMULARY OR COMPOUNDED ITEM Vitamin E vaginal suppositories 200u/ml.  One pv three times weekly.  [name] omeprazole (PRILOSEC) 20 MG capsule Take 1 capsule (20 mg total) by mouth daily.  . ondansetron (ZOFRAN ODT) 8 MG disintegrating tablet Take 1 tablet (8 mg total) by mouth every 8 (eight) hours as needed for nausea or vomiting.  Glory Rosebush VERIO test strip USE TO TEST ONE TIME DAILY AS DIRECTED  . ONGLYZA 5 MG TABS tablet TAKE ONE TABLET BY MOUTH ONE TIME DAILY  . raloxifene (EVISTA) 60 MG tablet Take 1 tablet (60 mg total) by mouth daily.  . vitamin C (ASCORBIC ACID) 500 MG tablet Take 500 mg by mouth daily.     Allergies:   Latex, Lidocaine, Peanut-containing drug products, Sulfa antibiotics, Reglan [metoclopramide], Adhesive [tape], Ciprofloxacin, and Sulfur   Social History   Socioeconomic History  . Marital status: Married    Spouse name: Not on file  . Number of children: Not on file  . Years of education: Not on file  . Highest education level: Not on file  Occupational History  . Not on file  Tobacco Use  . Smoking status: Never Smoker  . Smokeless tobacco: Never Used  . Tobacco comment: NEVER USED TOBACCO  Vaping Use  . Vaping Use: Never used  Substance and Sexual Activity  . Alcohol use: Yes    Alcohol/week: 1.0 standard drink    Types: 1 Standard drinks or equivalent per week  . Drug use: No  . Sexual activity: Yes    Partners: Male    Birth control/protection: Surgical  Other Topics Concern  . Not on file  Social History Narrative  . Not on file   Social Determinants of Health   Financial Resource Strain:   . Difficulty of Paying Living Expenses:   Food Insecurity:   . Worried About Charity fundraiser in the Last Year:   . Arboriculturist in the Last Year:   Transportation Needs:   . Film/video editor (Medical):   [name] Lack of Transportation (Non-Medical):   Physical Activity:   . Days of  Exercise per Week:   . Minutes of Exercise per Session:   Stress:   . Feeling of Stress :   Social Connections:   . Frequency of Communication with Friends and Family:   . Frequency of Social Gatherings with Friends and Family:   . Attends Religious Services:   . Active Member of Clubs or Organizations:   . Attends Archivist Meetings:   [name] Marital Status:      Family History: The patient's family history includes Breast cancer in her maternal aunt and maternal grandmother; Cancer in an other family member; Colon cancer in her cousin and father; Diabetes in her mother;  Melanoma in her cousin; Ovarian cancer in her cousin and maternal aunt. ROS:   Please see the history of present illness.    All other systems reviewed and are negative.  EKGs/Labs/Other Studies Reviewed:    The following studies were reviewed today:  Recent Labs: 11/18/2019: ALT 45; BUN 12; Creatinine 0.90; Hemoglobin 13.7; Platelet Count 302; Potassium 4.4; Sodium 140  Recent Lipid Panel    Component Value Date/Time   CHOL 220 (H) 06/21/2019 0846   CHOL 185 03/11/2017 0822   TRIG 172.0 (H) 06/21/2019 0846   TRIG 145 03/11/2017 0822   HDL 44.00 06/21/2019 0846   HDL 48 03/11/2017 0822   CHOLHDL 5 06/21/2019 0846   VLDL 34.4 06/21/2019 0846   LDLCALC 141 (H) 06/21/2019 0846   LDLCALC 108 (H) 03/11/2017 0822   LDLDIRECT 115.0 03/09/2018 0730    Physical Exam:    VS:  BP 122/80   Pulse 92   Ht 5\' 4"  (1.626 m)   Wt 174 lb (78.9 kg)   LMP 04/27/2017 Comment: spotting  SpO2 99%   BMI 29.87 kg/m     Wt Readings from Last 3 Encounters:  12/30/19 174 lb (78.9 kg)  11/18/19 168 lb (76.2 kg)  11/01/19 172 lb (78 kg)     GEN:  Well nourished, well developed in no acute distress HEENT: Normal NECK: No JVD; No carotid bruits LYMPHATICS: No lymphadenopathy CARDIAC: RRR, no murmurs, rubs, gallops RESPIRATORY:  Clear to auscultation without rales, wheezing or rhonchi  ABDOMEN: Soft, non-tender,  non-distended MUSCULOSKELETAL:  No edema; No deformity  SKIN: Warm and dry NEUROLOGIC:  Alert and oriented x 3 PSYCHIATRIC:  Normal affect    Signed, Shirlee More, MD  12/30/2019 8:54 AM    [place]

## 2019-12-30 ENCOUNTER — Other Ambulatory Visit: Payer: Self-pay

## 2019-12-30 ENCOUNTER — Encounter: Payer: Self-pay | Admitting: Cardiology

## 2019-12-30 ENCOUNTER — Ambulatory Visit: Payer: 59 | Admitting: Cardiology

## 2019-12-30 VITALS — BP 122/80 | HR 92 | Ht 64.0 in | Wt 174.0 lb

## 2019-12-30 DIAGNOSIS — Z794 Long term (current) use of insulin: Secondary | ICD-10-CM

## 2019-12-30 DIAGNOSIS — Z789 Other specified health status: Secondary | ICD-10-CM

## 2019-12-30 DIAGNOSIS — R079 Chest pain, unspecified: Secondary | ICD-10-CM | POA: Diagnosis not present

## 2019-12-30 DIAGNOSIS — E782 Mixed hyperlipidemia: Secondary | ICD-10-CM | POA: Diagnosis not present

## 2019-12-30 DIAGNOSIS — E0800 Diabetes mellitus due to underlying condition with hyperosmolarity without nonketotic hyperglycemic-hyperosmolar coma (NKHHC): Secondary | ICD-10-CM | POA: Diagnosis not present

## 2019-12-30 MED ORDER — METOPROLOL TARTRATE 25 MG PO TABS: 25.0000 mg | ORAL_TABLET | Freq: Two times a day (BID) | ORAL | 3 refills | Status: DC

## 2019-12-30 NOTE — Patient Instructions (Signed)
Medication Instructions:  Your physician has recommended you make the following change in your medication:  STOP: TOPROL XL START: Lopressor 25 mg take one tablet by mouth twice daily.  *If you need a refill on your cardiac medications before your next appointment, please call your pharmacy*   Lab Work: None If you have labs (blood work) drawn today and your tests are completely normal, you will receive your results only by: [name] MyChart Message (if you have MyChart) OR . A paper copy in the mail If you have any lab test that is abnormal or we need to change your treatment, we will call you to review the results.   Testing/Procedures: None   Follow-Up: At [facility], you and your health needs are our priority.  As part of our continuing mission to provide you with exceptional heart care, we have created designated Provider Care Teams.  These Care Teams include your primary Cardiologist (physician) and Advanced Practice Providers (APPs -  Physician Assistants and Nurse Practitioners) who all work together to provide you with the care you need, when you need it.  We recommend signing up for the patient portal called "MyChart".  Sign up information is provided on this After Visit Summary.  MyChart is used to connect with patients for Virtual Visits (Telemedicine).  Patients are able to view lab/test results, encounter notes, upcoming appointments, etc.  Non-urgent messages can be sent to your provider as well.   To learn more about what you can do with MyChart, go to [email or web address].    Your next appointment:   As needed  The format for your next appointment:   In Person  Provider:   Shirlee More, MD   Other Instructions

## 2020-01-01 ENCOUNTER — Encounter: Payer: Self-pay | Admitting: Medical

## 2020-01-02 ENCOUNTER — Telehealth: Payer: Self-pay | Admitting: Hematology & Oncology

## 2020-01-02 MED ORDER — ONDANSETRON 8 MG PO TBDP: 8.0000 mg | ORAL_TABLET | Freq: Three times a day (TID) | ORAL | 0 refills | Status: DC | PRN

## 2020-01-02 NOTE — Telephone Encounter (Signed)
Faxed 07/21/2013 to present medial records to: [place]: [phone] P: [phone]       COPY SCANNED

## 2020-01-18 ENCOUNTER — Ambulatory Visit
Admission: RE | Admit: 2020-01-18 | Discharge: 2020-01-18 | Disposition: A | Discharge status: Home / Self Care | Payer: 59 | Source: Ambulatory Visit | Attending: Obstetrics & Gynecology | Admitting: Obstetrics & Gynecology

## 2020-01-18 ENCOUNTER — Other Ambulatory Visit: Payer: Self-pay

## 2020-01-18 DIAGNOSIS — Z1231 Encounter for screening mammogram for malignant neoplasm of breast: Secondary | ICD-10-CM

## 2020-02-16 ENCOUNTER — Ambulatory Visit: Payer: 59 | Admitting: Hematology & Oncology

## 2020-02-16 ENCOUNTER — Other Ambulatory Visit: Payer: 59

## 2020-02-17 ENCOUNTER — Ambulatory Visit: Payer: 59 | Admitting: Hematology & Oncology

## 2020-02-17 ENCOUNTER — Other Ambulatory Visit: Payer: 59

## 2020-02-21 ENCOUNTER — Ambulatory Visit
Admission: RE | Admit: 2020-02-21 | Discharge: 2020-02-21 | Disposition: A | Discharge status: Home / Self Care | Payer: 59 | Source: Ambulatory Visit | Attending: Obstetrics & Gynecology | Admitting: Obstetrics & Gynecology

## 2020-02-21 DIAGNOSIS — R928 Other abnormal and inconclusive findings on diagnostic imaging of breast: Secondary | ICD-10-CM

## 2020-02-21 DIAGNOSIS — N6099 Unspecified benign mammary dysplasia of unspecified breast: Secondary | ICD-10-CM

## 2020-02-21 DIAGNOSIS — Z9189 Other specified personal risk factors, not elsewhere classified: Secondary | ICD-10-CM

## 2020-02-21 DIAGNOSIS — N6310 Unspecified lump in the right breast, unspecified quadrant: Secondary | ICD-10-CM

## 2020-02-21 DIAGNOSIS — Z803 Family history of malignant neoplasm of breast: Secondary | ICD-10-CM

## 2020-02-21 DIAGNOSIS — N6320 Unspecified lump in the left breast, unspecified quadrant: Secondary | ICD-10-CM

## 2020-02-21 DIAGNOSIS — N6489 Other specified disorders of breast: Secondary | ICD-10-CM

## 2020-02-21 MED ORDER — GADOBUTROL 1 MMOL/ML IV SOLN
8.0000 mL | Freq: Once | INTRAVENOUS | Status: AC | PRN
  Administered 2020-02-21: 8 mL via INTRAVENOUS

## 2020-02-23 ENCOUNTER — Telehealth: Payer: Self-pay | Admitting: *Deleted

## 2020-02-23 DIAGNOSIS — Z803 Family history of malignant neoplasm of breast: Secondary | ICD-10-CM

## 2020-02-23 DIAGNOSIS — N6310 Unspecified lump in the right breast, unspecified quadrant: Secondary | ICD-10-CM

## 2020-02-23 DIAGNOSIS — Z9189 Other specified personal risk factors, not elsewhere classified: Secondary | ICD-10-CM

## 2020-02-23 DIAGNOSIS — R928 Other abnormal and inconclusive findings on diagnostic imaging of breast: Secondary | ICD-10-CM

## 2020-02-23 NOTE — Telephone Encounter (Signed)
-----   Message from Volanda Napoleon sent at 02/23/2020  8:43 AM EDT ----- Regarding: Breast MRI Recommendations Good morning Sharee Pimple, The above patient had a breast MRI on 02/21/20 and below are the recommendations of Dr. Nolon Nations.  RECOMMENDATION: MR guided core biopsy RIGHT breast  Please send the orders for the MRI biopsy and clip placement exams to our scheduling department so we can make the patient's appointment.  Thank you!  [place]

## 2020-02-23 NOTE — Telephone Encounter (Signed)
Spoke with patient, advised of recommendations per radiologist. Advised TBC will be contacting her directly to schedule. Patient verbalizes understanding and is agreeable to plan.   Routing to provider for final review. Patient is agreeable to disposition. Will close encounter.

## 2020-02-23 NOTE — Telephone Encounter (Signed)
-----   Message from Megan Salon, MD sent at 02/23/2020  7:38 AM EDT ----- Hold for MRI guided biopsy.

## 2020-02-23 NOTE — Telephone Encounter (Signed)
Orders placed for MR guided core biopsy of right breast and clip placement.  TBC notified for scheduling.   Removed from IMG hold and placed in [facility] hold.

## 2020-02-29 ENCOUNTER — Other Ambulatory Visit (HOSPITAL_COMMUNITY): Payer: Self-pay | Admitting: Diagnostic Radiology

## 2020-02-29 ENCOUNTER — Ambulatory Visit
Admission: RE | Admit: 2020-02-29 | Discharge: 2020-02-29 | Disposition: A | Discharge status: Home / Self Care | Payer: 59 | Source: Ambulatory Visit | Attending: Obstetrics & Gynecology | Admitting: Obstetrics & Gynecology

## 2020-02-29 ENCOUNTER — Other Ambulatory Visit: Payer: Self-pay

## 2020-02-29 DIAGNOSIS — R928 Other abnormal and inconclusive findings on diagnostic imaging of breast: Secondary | ICD-10-CM

## 2020-02-29 DIAGNOSIS — N6310 Unspecified lump in the right breast, unspecified quadrant: Secondary | ICD-10-CM

## 2020-02-29 DIAGNOSIS — Z803 Family history of malignant neoplasm of breast: Secondary | ICD-10-CM

## 2020-02-29 DIAGNOSIS — Z9189 Other specified personal risk factors, not elsewhere classified: Secondary | ICD-10-CM

## 2020-02-29 MED ORDER — GADOBUTROL 1 MMOL/ML IV SOLN
7.0000 mL | Freq: Once | INTRAVENOUS | Status: AC | PRN
  Administered 2020-02-29: 7 mL via INTRAVENOUS

## 2020-03-13 ENCOUNTER — Inpatient Hospital Stay: Payer: 59 | Attending: Hematology & Oncology

## 2020-03-13 ENCOUNTER — Inpatient Hospital Stay (HOSPITAL_BASED_OUTPATIENT_CLINIC_OR_DEPARTMENT_OTHER): Payer: 59 | Admitting: Hematology & Oncology

## 2020-03-13 ENCOUNTER — Other Ambulatory Visit: Payer: Self-pay

## 2020-03-13 VITALS — BP 118/82 | HR 93 | Resp 18 | Wt 176.0 lb

## 2020-03-13 DIAGNOSIS — D270 Benign neoplasm of right ovary: Secondary | ICD-10-CM

## 2020-03-13 DIAGNOSIS — N6489 Other specified disorders of breast: Secondary | ICD-10-CM | POA: Insufficient documentation

## 2020-03-13 DIAGNOSIS — N62 Hypertrophy of breast: Secondary | ICD-10-CM | POA: Insufficient documentation

## 2020-03-13 DIAGNOSIS — Z8041 Family history of malignant neoplasm of ovary: Secondary | ICD-10-CM

## 2020-03-13 DIAGNOSIS — Z8543 Personal history of malignant neoplasm of ovary: Secondary | ICD-10-CM | POA: Insufficient documentation

## 2020-03-13 LAB — CBC WITH DIFFERENTIAL (CANCER CENTER ONLY)
Abs Immature Granulocytes: 0.02 10*3/uL (ref 0.00–0.07)
Basophils Absolute: 0.1 10*3/uL (ref 0.0–0.1)
Basophils Relative: 1 %
Eosinophils Absolute: 0.3 10*3/uL (ref 0.0–0.5)
Eosinophils Relative: 3 %
HCT: 40.6 % (ref 36.0–46.0)
Hemoglobin: 13.4 g/dL (ref 12.0–15.0)
Immature Granulocytes: 0 %
Lymphocytes Relative: 27 %
Lymphs Abs: 2.5 10*3/uL (ref 0.7–4.0)
MCH: 30.4 pg (ref 26.0–34.0)
MCHC: 33 g/dL (ref 30.0–36.0)
MCV: 92.1 fL (ref 80.0–100.0)
Monocytes Absolute: 0.6 10*3/uL (ref 0.1–1.0)
Monocytes Relative: 7 %
Neutro Abs: 5.9 10*3/uL (ref 1.7–7.7)
Neutrophils Relative %: 62 %
Platelet Count: 311 10*3/uL (ref 150–400)
RBC: 4.41 MIL/uL (ref 3.87–5.11)
RDW: 12.7 % (ref 11.5–15.5)
WBC Count: 9.3 10*3/uL (ref 4.0–10.5)
nRBC: 0 % (ref 0.0–0.2)

## 2020-03-13 LAB — CMP (CANCER CENTER ONLY)
ALT: 39 U/L (ref 0–44)
AST: 23 U/L (ref 15–41)
Albumin: 4.3 g/dL (ref 3.5–5.0)
Alkaline Phosphatase: 99 U/L (ref 38–126)
Anion gap: 8 (ref 5–15)
BUN: 12 mg/dL (ref 6–20)
CO2: 30 mmol/L (ref 22–32)
Calcium: 9.9 mg/dL (ref 8.9–10.3)
Chloride: 102 mmol/L (ref 98–111)
Creatinine: 0.9 mg/dL (ref 0.44–1.00)
GFR, Est AFR Am: >60 mL/min (ref >60)
GFR, Estimated: >60 mL/min (ref >60)
Glucose, Bld: 185 mg/dL — ABNORMAL HIGH (ref 70–99)
Potassium: 4.5 mmol/L (ref 3.5–5.1)
Sodium: 140 mmol/L (ref 135–145)
Total Bilirubin: 0.3 mg/dL (ref 0.3–1.2)
Total Protein: 7.1 g/dL (ref 6.5–8.1)

## 2020-03-13 NOTE — Progress Notes (Signed)
Hematology and Oncology Follow Up Visit  Carolyn Wilson [id number] 09/01/1970 49 y.o. 03/13/2020   Principle Diagnosis:  1. History of Sertoli-Leydig tumor of the left ovary  2. Strong family history of breast and ovarian cancer - BRCA 1/2 and p53 negative 3. 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 I am unsure if we have a new problem or not. Apparently, about 2 weeks ago, she had a biopsy on the right breast. There was an abnormality on mammogram. A biopsy pathology report ([id number]) showed fibrocystic changes with usual ductal hyperplasia. Also noted was pseudoangiomatous stromal hyperplasia ([place]). There is no invasive cancer.  I am not sure what to really make of this. She is currently on Evista. She has been on the Evista for 3 months. It is possible of the Vista just has not had time to really work. She was found to have some solar on the left breast.  She is menopausal. Back in March, we checked her estradiol level and was only 5.8. She had elevated mood nausea hormone level of 43 and an elevated FSH of 61.  We did put her on some Neurontin we last saw her because of some hot flashes. This is helped her.  She has had no problems with coronavirus.  She has had no fever. She has had no cough or shortness of breath. There is no nausea or vomiting.  She is still working. She works for the [employer].  She has had no rashes. She has this area on the third toe of her left foot. This is just on the dorsal aspect of the toe. It is erythematous. It is somewhat scaly. She says she has had this biopsied before.  There is been no problems with her blood sugars. It typically on the higher side. Today  her blood sugar is 185.  Overall, I would say her performance status is ECOG 0.   Medications:  Allergies as of 03/13/2020      Reactions   Latex Rash   Causes blisters   Lidocaine Nausea And Vomiting   Peanut-containing Drug Products Anaphylaxis   Sulfa Antibiotics Shortness Of Breath, Rash   Reglan [metoclopramide] Tinitus   Adhesive [tape] Rash   Ciprofloxacin Rash   Sulfur Rash      Medication List       Accurate as of March 13, 2020  8:46 AM. If you have any questions, ask your nurse or doctor.        aspirin 81 MG tablet Take 81 mg by mouth daily.   EPINEPHrine 0.3 mg/0.3 mL Soaj injection Commonly known as: EPI-PEN Inject once into muscle for any allergic reaction to peanut exposure   ezetimibe 10 MG tablet Commonly known as: ZETIA TAKE ONE TABLET BY MOUTH ONE TIME DAILY   gabapentin 300 MG capsule Commonly known as: Neurontin Take 1 capsule (300 mg total) by mouth at bedtime.   metFORMIN 500 MG 24 hr tablet Commonly known as: GLUCOPHAGE-XR TAKE TWO TABLETS BY MOUTH TWICE A DAY   metoprolol succinate 25 MG 24 hr tablet Commonly known as: TOPROL-XL Take 25 mg by mouth daily.   metoprolol tartrate 25 MG tablet Commonly known as: LOPRESSOR Take 1 tablet (25 mg  total) by mouth 2 (two) times daily.   NONFORMULARY OR COMPOUNDED ITEM Vitamin E vaginal suppositories 200u/ml.  One pv three times weekly.   omeprazole 20 MG capsule Commonly known as: PRILOSEC Take 1 capsule (20 mg total) by mouth daily.   ondansetron 8 MG disintegrating tablet Commonly known as: Zofran ODT Take 1 tablet (8 mg total) by mouth every 8 (eight) hours as needed for nausea or vomiting.   onetouch ultrasoft lancets Check blood sugar daily as directed.   OneTouch Verio test strip Generic drug: glucose blood USE TO TEST ONE TIME DAILY AS DIRECTED   Onglyza 5 MG Tabs tablet Generic drug: saxagliptin HCl TAKE ONE TABLET BY MOUTH ONE TIME DAILY   raloxifene 60 MG  tablet Commonly known as: EVISTA Take 1 tablet (60 mg total) by mouth daily.   vitamin C 500 MG tablet Commonly known as: ASCORBIC ACID Take 500 mg by mouth daily.       Allergies:  Allergies  Allergen Reactions  . Latex Rash    Causes blisters  . Lidocaine Nausea And Vomiting  . Peanut-Containing Drug Products Anaphylaxis  . Sulfa Antibiotics Shortness Of Breath and Rash  . Reglan [Metoclopramide] Tinitus  . Adhesive [Tape] Rash  . Ciprofloxacin Rash  . Sulfur Rash    Past Medical History, Surgical history, Social history, and Family History were reviewed and updated.  Review of Systems: Review of Systems  Constitutional: Negative.   HENT: Negative.   Eyes: Negative.   Respiratory: Negative.   Cardiovascular: Negative.   Gastrointestinal: Negative.   Genitourinary: Negative.   Musculoskeletal: Negative.   Skin: Negative.   Neurological: Negative.   Endo/Heme/Allergies: Negative.   Psychiatric/Behavioral: Negative.    [name]   Physical Exam:  weight is 176 lb (79.8 kg). Her blood pressure is 118/82 and her pulse is 93. Her respiration is 18 and oxygen saturation is 99%.   Wt Readings from Last 3 Encounters:  03/13/20 176 lb (79.8 kg)  12/30/19 174 lb (78.9 kg)  11/18/19 168 lb (76.2 kg)    Physical Exam Vitals reviewed.  Constitutional:      Comments: Her breast exam shows right breast with no masses, edema or erythema. She has the biopsy site at about the 10 o'clock position on the right breast. There is no erythema or ecchymosis associated with this. There is no right axillary adenopathy.  Left breast shows the lumpectomy scar at the edge of the areola at 3:00.  There is no erythema or warmth.  There is no discharge noted.  There is no tenderness to palpation.  There is no left axillary adenopathy.  HENT:     Head: Normocephalic and atraumatic.  Eyes:     Pupils: Pupils are equal, round, and reactive to light.  Cardiovascular:     Rate and Rhythm: Normal  rate and regular rhythm.     Heart sounds: Normal heart sounds.  Pulmonary:     Effort: Pulmonary effort is normal.     Breath sounds: Normal breath sounds.  Abdominal:     General: Bowel sounds are normal.     Palpations: Abdomen is soft.  Musculoskeletal:        General: No tenderness or deformity. Normal range of motion.     Cervical back: Normal range of motion.  Lymphadenopathy:     Cervical: No cervical adenopathy.  Skin:    General: Skin is warm and dry.     Findings: No erythema or rash.  Neurological:  Mental Status: She is alert and oriented to person, place, and time.  Psychiatric:        Behavior: Behavior normal.        Thought Content: Thought content normal.        Judgment: Judgment normal.      Lab Results  Component Value Date   WBC 9.3 03/13/2020   HGB 13.4 03/13/2020   HCT 40.6 03/13/2020   MCV 92.1 03/13/2020   PLT 311 03/13/2020   No results found for: FERRITIN, IRON, TIBC, UIBC, IRONPCTSAT Lab Results  Component Value Date   RBC 4.41 03/13/2020   No results found for: KPAFRELGTCHN, LAMBDASER, KAPLAMBRATIO No results found for: IGGSERUM, IGA, IGMSERUM No results found for: Odetta Pink, SPEI   Chemistry      Component Value Date/Time   NA 140 03/13/2020 0807   NA 142 11/01/2019 0902   NA 144 03/11/2017 0822   K 4.5 03/13/2020 0807   K 4.5 03/11/2017 0822   CL 102 03/13/2020 0807   CL 104 03/11/2017 0822   CO2 30 03/13/2020 0807   CO2 29 03/11/2017 0822   BUN 12 03/13/2020 0807   BUN 14 11/01/2019 0902   BUN 9 03/11/2017 0822   CREATININE 0.90 03/13/2020 0807   CREATININE 1.2 03/11/2017 0822      Component Value Date/Time   CALCIUM 9.9 03/13/2020 0807   CALCIUM 9.2 03/11/2017 0822   ALKPHOS 99 03/13/2020 0807   ALKPHOS 65 03/11/2017 0822   AST 23 03/13/2020 0807   ALT 39 03/13/2020 0807   ALT 25 03/11/2017 0822   BILITOT 0.3 03/13/2020 0807       Impression and Plan:  Ms. Sitton is a 49 yo post-menopausal female with a 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   She was diagnosed with stromal hyperplasia of the right breast and ductal hyperplasia of the left breast in December 2020.    She had a left breast lumpectomy with radioactive seed placement in February 2021.   Now, she has a similar situation in the right breast.  I would have to believe that these lesions as she has are "markers" for the development of invasive breast cancer.  It is possible that we need to make a change from Evista to an aromatase inhibitor since she is postmenopausal. Again this might be somewhat controversial. We will be using the aromatase inhibitor as a preventive means.  I do not see an obvious problem with making a change. I would probably try her on Aromasin. I think this would be reasonable.  We will go ahead and make a change.  I would like to see her back in 3 months.  I think she gets her mammogram/MRI every 6 months.   Volanda Napoleon, MD 8/24/20218:46 AM

## 2020-03-15 ENCOUNTER — Other Ambulatory Visit: Payer: Self-pay | Admitting: *Deleted

## 2020-03-15 DIAGNOSIS — Z9189 Other specified personal risk factors, not elsewhere classified: Secondary | ICD-10-CM

## 2020-03-15 DIAGNOSIS — D279 Benign neoplasm of unspecified ovary: Secondary | ICD-10-CM

## 2020-03-15 DIAGNOSIS — N6310 Unspecified lump in the right breast, unspecified quadrant: Secondary | ICD-10-CM

## 2020-03-15 DIAGNOSIS — Z803 Family history of malignant neoplasm of breast: Secondary | ICD-10-CM

## 2020-03-15 DIAGNOSIS — N6099 Unspecified benign mammary dysplasia of unspecified breast: Secondary | ICD-10-CM

## 2020-03-16 MED ORDER — EXEMESTANE 25 MG PO TABS: 25.0000 mg | ORAL_TABLET | Freq: Every day | ORAL | 6 refills | Status: DC

## 2020-03-17 ENCOUNTER — Other Ambulatory Visit: Payer: Self-pay | Admitting: Medical

## 2020-03-17 DIAGNOSIS — E1169 Type 2 diabetes mellitus with other specified complication: Secondary | ICD-10-CM

## 2020-03-21 ENCOUNTER — Ambulatory Visit: Payer: 59 | Admitting: Gastroenterology

## 2020-04-02 ENCOUNTER — Encounter: Payer: Self-pay | Admitting: Medical

## 2020-04-02 ENCOUNTER — Ambulatory Visit (INDEPENDENT_AMBULATORY_CARE_PROVIDER_SITE_OTHER): Payer: 59 | Admitting: Medical

## 2020-04-02 ENCOUNTER — Other Ambulatory Visit: Payer: Self-pay

## 2020-04-02 VITALS — BP 130/70 | Ht 64.0 in | Wt 178.6 lb

## 2020-04-02 DIAGNOSIS — E119 Type 2 diabetes mellitus without complications: Secondary | ICD-10-CM | POA: Diagnosis not present

## 2020-04-02 DIAGNOSIS — E785 Hyperlipidemia, unspecified: Secondary | ICD-10-CM

## 2020-04-02 DIAGNOSIS — R7989 Other specified abnormal findings of blood chemistry: Secondary | ICD-10-CM | POA: Diagnosis not present

## 2020-04-02 DIAGNOSIS — E0800 Diabetes mellitus due to underlying condition with hyperosmolarity without nonketotic hyperglycemic-hyperosmolar coma (NKHHC): Secondary | ICD-10-CM

## 2020-04-02 DIAGNOSIS — E1169 Type 2 diabetes mellitus with other specified complication: Secondary | ICD-10-CM

## 2020-04-02 LAB — GLUCOSE, POCT (MANUAL RESULT ENTRY): POC Glucose: 101 mg/dl — AB (ref 70–99)

## 2020-04-02 NOTE — Patient Instructions (Addendum)
For diabetes will put in future a1c and cmp. Continue metformin and onglyza. Make adjustment to tx if needed.  Bp controlled metoprolol.   For high cholesterol check lipid panel on future labs. Continue zetia and follow lab.  For low vit d place future vit D level.  Follow up 3 months from lab draw date or sooner if needed.

## 2020-04-02 NOTE — Progress Notes (Signed)
Subjective:    Patient ID: Carolyn Wilson, female    DOB: [birth year], 49 y.o.   MRN: [id number]  HPI  Pt in for diabetic follow up.  Pt just got call from hospital mom admitted due to 3 missed dialysis appointments. So appears little stressed.  Pt last a1c. Was 6.5. Pt had other medical issues that took priority(breast lump ex). Pt is still exercising. Pt is eating healthy. Pt not eating processed carbohydrates or soda.  Pt has had covid vaccine series and got booster 3 weeks ago.    Review of Systems  Constitutional: Negative for chills, fatigue and fever.  HENT: Negative for dental problem.   Respiratory: Negative for cough, chest tightness, shortness of breath and wheezing.   Cardiovascular: Negative for chest pain and palpitations.  Gastrointestinal: Negative for abdominal pain, blood in stool, diarrhea and rectal pain.  Genitourinary: Negative for dysuria.  Musculoskeletal: Negative for back pain, joint swelling and neck pain.  Skin: Negative for rash.  Neurological: Negative for dizziness, seizures, weakness and headaches.  Hematological: Negative for adenopathy. Does not bruise/bleed easily.  Psychiatric/Behavioral: Negative for behavioral problems.   Past Medical History:  Diagnosis Date  . Abnormal Pap smear of cervix    h/o colposcopy/biopsy  . Allergy   . Asthma    HX  . Breast cancer ([place]) 2007  . Cancer ([place]) [id number]   ovarian  . Diabetes mellitus without complication ([place]) 67/6720    diagnosed by PCP, [name]   . GERD (gastroesophageal reflux disease)   . Hyperlipemia    on medication   . PONV (postoperative nausea and vomiting)      Social History   Socioeconomic History  . Marital status: Married    Spouse name: Not on file  . Number of children: Not on file  . Years of education: Not on file  . Highest education level: Not on file  Occupational History  . Not on file  Tobacco Use  . Smoking status: Never Smoker  . Smokeless  tobacco: Never Used  . Tobacco comment: NEVER USED TOBACCO  Vaping Use  . Vaping Use: Never used  Substance and Sexual Activity  . Alcohol use: Yes    Alcohol/week: 1.0 standard drink    Types: 1 Standard drinks or equivalent per week  . Drug use: No  . Sexual activity: Yes    Partners: Male    Birth control/protection: Surgical  Other Topics Concern  . Not on file  Social History Narrative  . Not on file   Social Determinants of Health   Financial Resource Strain:   . Difficulty of Paying Living Expenses: Not on file  Food Insecurity:   . Worried About Charity fundraiser in the Last Year: Not on file  . Ran Out of Food in the Last Year: Not on file  Transportation Needs:   . Lack of Transportation (Medical): Not on file  . Lack of Transportation (Non-Medical): Not on file  Physical Activity:   . Days of Exercise per Week: Not on file  . Minutes of Exercise per Session: Not on file  Stress:   . Feeling of Stress : Not on file  Social Connections:   . Frequency of Communication with Friends and Family: Not on file  . Frequency of Social Gatherings with Friends and Family: Not on file  . Attends Religious Services: Not on file  . Active Member of Clubs or Organizations: Not on file  . Attends Archivist  Meetings: Not on file  . Marital Status: Not on file  Intimate Partner Violence:   . Fear of Current or Ex-Partner: Not on file  . Emotionally Abused: Not on file  . Physically Abused: Not on file  . Sexually Abused: Not on file    Past Surgical History:  Procedure Laterality Date  . [age]ENDECTOMY     age [age]  . BREAST EXCISIONAL BIOPSY Left 08/2019  . BREAST LUMPECTOMY Left 2008   left breast  . CHOLECYSTECTOMY  2000  . ENDOMETRIAL ABLATION  2009  . LAPAROSCOPIC SALPINGO OOPHERECTOMY Left 1996   with right tubal ligation  . RADIOACTIVE SEED GUIDED EXCISIONAL BREAST BIOPSY Left 09/13/2019   Procedure: RADIOACTIVE SEED GUIDED EXCISIONAL LEFT BREAST  BIOPSY;  Surgeon: [name], MD;  Location: [facility];  Service: General;  Laterality: Left;  . TUBAL LIGATION      Family History  Problem Relation Age of Onset  . Diabetes Mother   . Colon cancer Father   . Breast cancer Maternal Aunt   . Ovarian cancer Maternal Aunt   . Ovarian cancer Cousin   . Colon cancer Cousin   . Melanoma Cousin   . Breast cancer Maternal Grandmother   . Cancer Other        breast and ovarian-maternal side    Allergies  Allergen Reactions  . Latex Rash    Causes blisters  . Lidocaine Nausea And Vomiting  . Peanut-Containing Drug Products Anaphylaxis  . Sulfa Antibiotics Shortness Of Breath and Rash  . Reglan [Metoclopramide] Tinitus  . Adhesive [Tape] Rash  . Ciprofloxacin Rash  . Sulfur Rash    Current Outpatient Medications on File Prior to Visit  Medication Sig Dispense Refill  . aspirin 81 MG tablet Take 81 mg by mouth daily.    [name] EPINEPHrine 0.3 mg/0.3 mL IJ SOAJ injection Inject once into muscle for any allergic reaction to peanut exposure 2 Device 1  . exemestane (AROMASIN) 25 MG tablet Take 1 tablet (25 mg total) by mouth daily after breakfast. 30 tablet 6  . ezetimibe (ZETIA) 10 MG tablet TAKE ONE TABLET BY MOUTH ONE TIME DAILY 30 tablet 5  . gabapentin (NEURONTIN) 300 MG capsule Take 1 capsule (300 mg total) by mouth at bedtime. 90 capsule 2  . Lancets (ONETOUCH ULTRASOFT) lancets Check blood sugar daily as directed. 100 each 12  . metFORMIN (GLUCOPHAGE-XR) 500 MG 24 hr tablet TAKE TWO TABLETS BY MOUTH TWICE A DAY 120 tablet 3  . metoprolol succinate (TOPROL-XL) 25 MG 24 hr tablet Take 25 mg by mouth daily.    . metoprolol tartrate (LOPRESSOR) 25 MG tablet Take 1 tablet (25 mg total) by mouth 2 (two) times daily. 180 tablet 3  . NONFORMULARY OR COMPOUNDED ITEM Vitamin E vaginal suppositories 200u/ml.  One pv three times weekly. 36 each 3  . omeprazole (PRILOSEC) 20 MG capsule Take 1 capsule (20 mg total) by mouth  daily. 90 capsule 3  . ondansetron (ZOFRAN ODT) 8 MG disintegrating tablet Take 1 tablet (8 mg total) by mouth every 8 (eight) hours as needed for nausea or vomiting. 20 tablet 0  . ONETOUCH VERIO test strip USE TO TEST ONE TIME DAILY AS DIRECTED 100 strip 11  . ONGLYZA 5 MG TABS tablet TAKE ONE TABLET BY MOUTH ONE TIME DAILY 30 tablet 5  . vitamin C (ASCORBIC ACID) 500 MG tablet Take 500 mg by mouth daily.     No current facility-administered medications on file prior  to visit.    Ht 5\' 4"  (1.626 m)   Wt 178 lb 9.6 oz (81 kg)   LMP 04/27/2017 Comment: spotting  BMI 30.66 kg/m       Objective:   Physical Exam  General Mental Status- Alert. General Appearance- Not in acute distress.   Skin General: Color- Normal Color. Moisture- Normal Moisture.  Neck Carotid Arteries- Normal color. Moisture- Normal Moisture. No carotid bruits. No JVD.  Chest and Lung Exam Auscultation: Breath Sounds:-Normal.  Cardiovascular Auscultation:Rythm- Regular. Murmurs & Other Heart Sounds:Auscultation of the heart reveals- No Murmurs.  Abdomen Inspection:-Inspeection Normal. Palpation/Percussion:Note:No mass. Palpation and Percussion of the abdomen reveal- Non Tender, Non Distended + BS, no rebound or guarding.   Neurologic Cranial Nerve exam:- CN III-XII intact(No nystagmus), symmetric smile. Strength:- 5/5 equal and symmetric strength both upper and lower extremities.      Assessment & Plan:  For diabetes will put in future a1c and cmp. Continue metformin and onglyza. Make adjustment to tx if needed.  Bp controlled metoprolol.   For high cholesterol check lipid panel on future labs. Continue zetia and follow lab.  For low vit d place future vit D level.  Follow up 3 months from lab draw date or sooner if needed.  Mackie Pai, PA-C   Time spent with patient today was 25  minutes which consisted of chart revdiew, discussing diagnosis, work up treatment and documentation.

## 2020-04-05 ENCOUNTER — Other Ambulatory Visit: Payer: Self-pay

## 2020-04-05 ENCOUNTER — Other Ambulatory Visit (INDEPENDENT_AMBULATORY_CARE_PROVIDER_SITE_OTHER): Payer: 59

## 2020-04-05 DIAGNOSIS — E0800 Diabetes mellitus due to underlying condition with hyperosmolarity without nonketotic hyperglycemic-hyperosmolar coma (NKHHC): Secondary | ICD-10-CM

## 2020-04-05 DIAGNOSIS — R7989 Other specified abnormal findings of blood chemistry: Secondary | ICD-10-CM | POA: Diagnosis not present

## 2020-04-05 DIAGNOSIS — E1169 Type 2 diabetes mellitus with other specified complication: Secondary | ICD-10-CM

## 2020-04-08 ENCOUNTER — Encounter: Payer: Self-pay | Admitting: Medical

## 2020-04-09 LAB — CBC WITH DIFFERENTIAL/PLATELET
Absolute Monocytes: 493 cells/uL (ref 200–950)
Basophils Absolute: 62 cells/uL (ref 0–200)
Basophils Relative: 0.8 %
Eosinophils Absolute: 570 cells/uL — ABNORMAL HIGH (ref 15–500)
Eosinophils Relative: 7.4 %
HCT: 39.7 % (ref 35.0–45.0)
Hemoglobin: 13.6 g/dL (ref 11.7–15.5)
Lymphs Abs: 2302 cells/uL (ref 850–3900)
MCH: 30.5 pg (ref 27.0–33.0)
MCHC: 34.3 g/dL (ref 32.0–36.0)
MCV: 89 fL (ref 80.0–100.0)
MPV: 10.9 fL (ref 7.5–12.5)
Monocytes Relative: 6.4 %
Neutro Abs: 4274 cells/uL (ref 1500–7800)
Neutrophils Relative %: 55.5 %
Platelets: 327 10*3/uL (ref 140–400)
RBC: 4.46 10*6/uL (ref 3.80–5.10)
RDW: 12.7 % (ref 11.0–15.0)
Total Lymphocyte: 29.9 %
WBC: 7.7 10*3/uL (ref 3.8–10.8)

## 2020-04-09 LAB — COMPREHENSIVE METABOLIC PANEL
AG Ratio: 1.8 (calc) (ref 1.0–2.5)
ALT: 31 U/L — ABNORMAL HIGH (ref 6–29)
AST: 23 U/L (ref 10–35)
Albumin: 4.2 g/dL (ref 3.6–5.1)
Alkaline phosphatase (APISO): 92 U/L (ref 31–125)
BUN: 9 mg/dL (ref 7–25)
CO2: 29 mmol/L (ref 20–32)
Calcium: 9.9 mg/dL (ref 8.6–10.2)
Chloride: 102 mmol/L (ref 98–110)
Creat: 0.82 mg/dL (ref 0.50–1.10)
Globulin: 2.4 g/dL (calc) (ref 1.9–3.7)
Glucose, Bld: 135 mg/dL — ABNORMAL HIGH (ref 65–99)
Potassium: 4.8 mmol/L (ref 3.5–5.3)
Sodium: 140 mmol/L (ref 135–146)
Total Bilirubin: 0.3 mg/dL (ref 0.2–1.2)
Total Protein: 6.6 g/dL (ref 6.1–8.1)

## 2020-04-09 LAB — HEMOGLOBIN A1C
Hgb A1c MFr Bld: 7.2 % of total Hgb — ABNORMAL HIGH (ref <5.7)
Mean Plasma Glucose: 160 (calc)
eAG (mmol/L): 8.9 (calc)

## 2020-04-09 LAB — LIPID PANEL
Cholesterol: 210 mg/dL — ABNORMAL HIGH (ref <200)
HDL: 38 mg/dL — ABNORMAL LOW (ref >=50)
LDL Cholesterol (Calc): 128 mg/dL (calc) — ABNORMAL HIGH
Non-HDL Cholesterol (Calc): 172 mg/dL (calc) — ABNORMAL HIGH (ref <130)
Total CHOL/HDL Ratio: 5.5 (calc) — ABNORMAL HIGH (ref <5.0)
Triglycerides: 289 mg/dL — ABNORMAL HIGH (ref <150)

## 2020-04-09 LAB — VITAMIN D 1,25 DIHYDROXY
Vitamin D 1, 25 (OH)2 Total: 42 pg/mL (ref 18–72)
Vitamin D2 1, 25 (OH)2: <8 pg/mL
Vitamin D3 1, 25 (OH)2: 42 pg/mL

## 2020-04-11 ENCOUNTER — Telehealth: Payer: Self-pay | Admitting: Medical

## 2020-04-11 MED ORDER — PIOGLITAZONE HCL 15 MG PO TABS: 15.0000 mg | ORAL_TABLET | Freq: Every day | ORAL | 0 refills | Status: DC

## 2020-04-11 NOTE — Telephone Encounter (Signed)
Patient is requesting a referral to the Endocrinologist, patient states she is bleeding rectally again and needs to be banded. Per Gertie Fey doctor it might be caused by metformin.   Please advise

## 2020-04-11 NOTE — Telephone Encounter (Signed)
rx actos sent to pt pharmacy. Dc metformin.

## 2020-04-12 NOTE — Telephone Encounter (Signed)
Referral to [facility] placed.

## 2020-04-12 NOTE — Addendum Note (Signed)
Addended by: Anabel Halon on: 04/12/2020 03:53 PM   Modules accepted: Orders

## 2020-04-24 ENCOUNTER — Other Ambulatory Visit: Payer: Self-pay

## 2020-04-24 ENCOUNTER — Encounter: Payer: Self-pay | Admitting: Internal Medicine

## 2020-04-24 ENCOUNTER — Other Ambulatory Visit: Payer: Self-pay | Admitting: Medical

## 2020-04-24 ENCOUNTER — Ambulatory Visit (INDEPENDENT_AMBULATORY_CARE_PROVIDER_SITE_OTHER): Payer: 59 | Admitting: Internal Medicine

## 2020-04-24 VITALS — BP 120/78 | HR 78 | Wt 176.0 lb

## 2020-04-24 DIAGNOSIS — Z794 Long term (current) use of insulin: Secondary | ICD-10-CM

## 2020-04-24 DIAGNOSIS — E785 Hyperlipidemia, unspecified: Secondary | ICD-10-CM

## 2020-04-24 DIAGNOSIS — E1169 Type 2 diabetes mellitus with other specified complication: Secondary | ICD-10-CM

## 2020-04-24 DIAGNOSIS — E0800 Diabetes mellitus due to underlying condition with hyperosmolarity without nonketotic hyperglycemic-hyperosmolar coma (NKHHC): Secondary | ICD-10-CM

## 2020-04-24 MED ORDER — RYBELSUS 3 MG PO TABS: 3.0000 mg | ORAL_TABLET | Freq: Every day | ORAL | 6 refills | Status: DC

## 2020-04-24 NOTE — Patient Instructions (Signed)
-   STOP Onglyza - Start Rybelsus 3 mg daily with breakfast  - Continue Pioglitazone ( Actos) 15 mg, 1 tablet with Breakfast     Choose healthy, lower carb lower calorie snacks: toss salad, cooked vegetables, cottage cheese, peanut butter, low fat cheese / string cheese, lower sodium deli meat, tuna salad or chicken salad     HOW TO TREAT LOW BLOOD SUGARS (Blood sugar LESS THAN 70 MG/DL)  Please follow the RULE OF 15 for the treatment of hypoglycemia treatment (when your (blood sugars are less than 70 mg/dL)    STEP 1: Take 15 grams of carbohydrates when your blood sugar is low, which includes:   3-4 GLUCOSE TABS  OR  3-4 OZ OF JUICE OR REGULAR SODA OR  ONE TUBE OF GLUCOSE GEL     STEP 2: RECHECK blood sugar in 15 MINUTES STEP 3: If your blood sugar is still low at the 15 minute recheck --> then, go back to STEP 1 and treat AGAIN with another 15 grams of carbohydrates.

## 2020-04-24 NOTE — Progress Notes (Signed)
Name: Carolyn Wilson  MRN/ DOB: [id number], Aug 26, 1970   Age/ Sex: 49 y.o., female    PCP: Elise Benne   Reason for Endocrinology Evaluation: Type 2 Diabetes Mellitus     Date of Initial Endocrinology Visit: 04/24/2020     PATIENT IDENTIFIER: Carolyn Wilson is a 49 y.o. female with a past medical history of T2DM, Dyslipidemia and Asthma . The patient presented for initial endocrinology clinic visit on 04/24/2020 for consultative assistance with her diabetes management.    HPI: [name] was    Diagnosed with DM in 2017 Prior Medications tried/Intolerance: Metformin- GI side effects. Pioglitazone started 02/2020 Currently checking blood sugars 1 x / day,  before breakfast Hypoglycemia episodes : yes             Symptoms: shaky            Frequency:once  Hemoglobin A1c has ranged from 6.2% in 2018, peaking at 7.4% in 2020. Patient required assistance for hypoglycemia: no Patient has required hospitalization within the last 1 year from hyper or hypoglycemia: no  In terms of diet, the patient eats 6 small meals, avoids sugar- sweetened beverages    HOME DIABETES REGIMEN: Onglyza 5 mg daily  Pioglitazone 15 mg daily    Statin: Pt on Zetia, can't walk on statins ACE-I/ARB: No  Prior Diabetic Education: Yes   METER DOWNLOAD SUMMARY: Did not bring       DIABETIC COMPLICATIONS: Microvascular complications:    Denies: CKD, retinopathy, neuropathy   Last eye exam: Completed 01/2020  Macrovascular complications:    Denies: CAD, PVD, CVA   PAST HISTORY: Past Medical History:  Past Medical History:  Diagnosis Date   Abnormal Pap smear of cervix    h/o colposcopy/biopsy   Allergy    Asthma    HX   Breast cancer ([place]) 2007   Cancer ([place]) 1996-1997   ovarian   Diabetes mellitus without complication ([place]) 58/0998    diagnosed by PCP, [name]    GERD (gastroesophageal reflux disease)    Hyperlipemia    on medication     PONV (postoperative nausea and vomiting)    Past Surgical History:  Past Surgical History:  Procedure Laterality Date   APPENDECTOMY     age [age]   BREAST EXCISIONAL BIOPSY Left 08/2019   BREAST LUMPECTOMY Left 2008   left breast   CHOLECYSTECTOMY  2000   ENDOMETRIAL ABLATION  2009   LAPAROSCOPIC SALPINGO OOPHERECTOMY Left 1996   with right tubal ligation   RADIOACTIVE SEED GUIDED EXCISIONAL BREAST BIOPSY Left 09/13/2019   Procedure: RADIOACTIVE SEED GUIDED EXCISIONAL LEFT BREAST BIOPSY;  Surgeon: [name], MD;  Location: [facility];  Service: General;  Laterality: Left;   TUBAL LIGATION        Social History:  reports that she has never smoked. She has never used smokeless tobacco. She reports current alcohol use of about 1.0 standard drink of alcohol per week. She reports that she does not use drugs. Family History:  Family History  Problem Relation Age of Onset   Diabetes Mother    Colon cancer Father    Breast cancer Maternal Aunt    Ovarian cancer Maternal Aunt    Ovarian cancer Cousin    Colon cancer Cousin    Melanoma Cousin    Breast cancer Maternal Grandmother    Cancer Other        breast and ovarian-maternal side     HOME MEDICATIONS: Allergies as  of 04/24/2020      Reactions   Latex Rash   Causes blisters   Lidocaine Nausea And Vomiting   Peanut-containing Drug Products Anaphylaxis   Sulfa Antibiotics Shortness Of Breath, Rash   Reglan [metoclopramide] Tinitus   Metformin And Related Diarrhea   Adhesive [tape] Rash   Ciprofloxacin Rash   Sulfur Rash      Medication List       Accurate as of April 24, 2020  7:59 AM. If you have any questions, ask your nurse or doctor.        aspirin 81 MG tablet Take 81 mg by mouth daily.   EPINEPHrine 0.3 mg/0.3 mL Soaj injection Commonly known as: EPI-PEN Inject once into muscle for any allergic reaction to peanut exposure   exemestane 25 MG tablet Commonly known  as: AROMASIN Take 1 tablet (25 mg total) by mouth daily after breakfast.   ezetimibe 10 MG tablet Commonly known as: ZETIA TAKE ONE TABLET BY MOUTH ONE TIME DAILY   gabapentin 300 MG capsule Commonly known as: Neurontin Take 1 capsule (300 mg total) by mouth at bedtime.   metoprolol succinate 25 MG 24 hr tablet Commonly known as: TOPROL-XL Take 25 mg by mouth daily.   metoprolol tartrate 25 MG tablet Commonly known as: LOPRESSOR Take 1 tablet (25 mg total) by mouth 2 (two) times daily.   NONFORMULARY OR COMPOUNDED ITEM Vitamin E vaginal suppositories 200u/ml.  One pv three times weekly.   omeprazole 20 MG capsule Commonly known as: PRILOSEC Take 1 capsule (20 mg total) by mouth daily.   ondansetron 8 MG disintegrating tablet Commonly known as: Zofran ODT Take 1 tablet (8 mg total) by mouth every 8 (eight) hours as needed for nausea or vomiting.   onetouch ultrasoft lancets Check blood sugar daily as directed.   OneTouch Verio test strip Generic drug: glucose blood USE TO TEST ONE TIME DAILY AS DIRECTED   Onglyza 5 MG Tabs tablet Generic drug: saxagliptin HCl TAKE ONE TABLET BY MOUTH ONE TIME DAILY   pioglitazone 15 MG tablet Commonly known as: Actos Take 1 tablet (15 mg total) by mouth daily.   vitamin C 500 MG tablet Commonly known as: ASCORBIC ACID Take 500 mg by mouth daily.        ALLERGIES: Allergies  Allergen Reactions   Latex Rash    Causes blisters   Lidocaine Nausea And Vomiting   Peanut-Containing Drug Products Anaphylaxis   Sulfa Antibiotics Shortness Of Breath and Rash   Reglan [Metoclopramide] Tinitus   Metformin And Related Diarrhea   Adhesive [Tape] Rash   Ciprofloxacin Rash   Sulfur Rash     REVIEW OF SYSTEMS: A comprehensive ROS was conducted with the patient and is negative except as per HPI and below:  ROS    OBJECTIVE:   VITAL SIGNS: BP 120/78    Pulse 78    Wt 176 lb (79.8 kg)    LMP 04/27/2017 Comment: spotting    SpO2 97%    BMI 30.21 kg/m    PHYSICAL EXAM:  General: Pt appears well and is in NAD  Hydration: Well-hydrated with moist mucous membranes and good skin turgor  HEENT: Head: Unremarkable with good dentition. Oropharynx clear without exudate.  Eyes: External eye exam normal without stare, lid lag or exophthalmos.  EOM intact.  PERRL.  Neck: General: Supple without adenopathy or carotid bruits. Thyroid: Thyroid size normal.  No goiter or nodules appreciated. No thyroid bruit.  Lungs: Clear with good BS bilat  with no rales, rhonchi, or wheezes  Heart: RRR with normal S1 and S2 and no gallops; no murmurs; no rub  Abdomen: Normoactive bowel sounds, soft, nontender, without masses or organomegaly palpable  Extremities:  Lower extremities - No pretibial edema. No lesions.  Skin: Normal texture and temperature to palpation. No rash noted. No Acanthosis nigricans/skin tags. No lipohypertrophy.  Neuro: MS is good with appropriate affect, pt is alert and Ox3    DM foot exam: 04/24/2020  The skin of the feet is intact without sores or ulcerations. The pedal pulses are 2+ on right and 2+ on left. The sensation is intact to a screening 5.07, 10 gram monofilament bilaterally   DATA REVIEWED:  Lab Results  Component Value Date   HGBA1C 7.2 (H) 04/05/2020   HGBA1C 6.5 06/21/2019   HGBA1C 7.4 (H) 12/09/2018   Lab Results  Component Value Date   MICROALBUR 0.8 08/04/2016   LDLCALC 128 (H) 04/05/2020   CREATININE 0.82 04/05/2020   Lab Results  Component Value Date   MICRALBCREAT 0.4 08/04/2016    Lab Results  Component Value Date   CHOL 210 (H) 04/05/2020   HDL 38 (L) 04/05/2020   LDLCALC 128 (H) 04/05/2020   LDLDIRECT 115.0 03/09/2018   TRIG 289 (H) 04/05/2020   CHOLHDL 5.5 (H) 04/05/2020        ASSESSMENT / PLAN / RECOMMENDATIONS:   1) Type 2 Diabetes Mellitus, Sub-optimally controlled, Without complications - Most recent A1c of 7.2 %. Goal A1c < 7.0 %.   Plan: GENERAL: I  have discussed with the patient the pathophysiology of diabetes. We went over the natural progression of the disease. We talked about both insulin resistance and insulin deficiency. We stressed the importance of lifestyle changes including diet and exercise. I explained the complications associated with diabetes including retinopathy, nephropathy, neuropathy as well as increased risk of cardiovascular disease. We went over the benefit seen with glycemic control.    I explained to the patient that diabetic patients are at higher than normal risk for amputations.   We discussed the importance of low carb diet and avoiding sugar-sweetened beverages   We discussed the benefits of GLP-1 agonists as well as their GI side effects. Would like to switch Onglyza to Rybelsus, coupon provided   MEDICATIONS: - STOP Onglyza - Start Rybelsus 3 mg daily with breakfast  - Continue Pioglitazone ( Actos) 15 mg, 1 tablet with Breakfast    EDUCATION / INSTRUCTIONS:  BG monitoring instructions: Patient is instructed to check her blood sugars 1 times a day, fasting.  Call [facility] if: BG persistently < 70   I reviewed the Rule of 15 for the treatment of hypoglycemia in detail with the patient. Literature supplied.   2) Diabetic complications:   Eye: Does not have known diabetic retinopathy.   Neuro/ Feet: Does not have known diabetic peripheral neuropathy.  Renal: Patient does not have known baseline CKD. She is not on an ACEI/ARB at present.  3) Dyslipidemia: LDL above goal of 128 mg/dL. Patient is intolerant to statins, has tried 3 different ones. We discussed the cardiovascular benefits of statins, I will consider trying her on Rosuvastatin 5 mg once week in the future.      F/U in 3 months         Signed electronically by: Mack Guise, MD  [facility]  [facility] [address] [place], [place] [address] Phone:  [phone] FAX: [phone]   CC:  Elise Benne [place] [id number] Phone: [phone]  Fax: [phone]    Return to Endocrinology clinic as below: Future Appointments  Date Time Provider [place]  04/27/2020  3:40 PM Lavena Bullion, DO [facility]  05/25/2020 11:30 AM Megan Salon, MD [place] [facility] None  06/27/2020  8:00 AM [facility] None  06/27/2020  8:30 AM [place], Rudell Cobb, MD [facility] None

## 2020-04-27 ENCOUNTER — Ambulatory Visit: Payer: 59 | Admitting: Gastroenterology

## 2020-05-01 ENCOUNTER — Other Ambulatory Visit: Payer: Self-pay | Admitting: Medical

## 2020-05-07 ENCOUNTER — Encounter: Payer: Self-pay | Admitting: Internal Medicine

## 2020-05-08 ENCOUNTER — Encounter (HOSPITAL_COMMUNITY): Payer: Self-pay

## 2020-05-09 ENCOUNTER — Other Ambulatory Visit: Payer: Self-pay

## 2020-05-09 ENCOUNTER — Encounter: Payer: Self-pay | Admitting: Medical

## 2020-05-09 ENCOUNTER — Ambulatory Visit: Payer: 59 | Admitting: Medical

## 2020-05-09 ENCOUNTER — Telehealth: Payer: Self-pay | Admitting: Medical

## 2020-05-09 VITALS — BP 110/70 | HR 82 | Resp 18 | Ht 61.0 in | Wt 169.8 lb

## 2020-05-09 DIAGNOSIS — T383X5A Adverse effect of insulin and oral hypoglycemic [antidiabetic] drugs, initial encounter: Secondary | ICD-10-CM

## 2020-05-09 DIAGNOSIS — E7800 Pure hypercholesterolemia, unspecified: Secondary | ICD-10-CM | POA: Diagnosis not present

## 2020-05-09 DIAGNOSIS — E119 Type 2 diabetes mellitus without complications: Secondary | ICD-10-CM | POA: Diagnosis not present

## 2020-05-09 DIAGNOSIS — E785 Hyperlipidemia, unspecified: Secondary | ICD-10-CM

## 2020-05-09 DIAGNOSIS — E1169 Type 2 diabetes mellitus with other specified complication: Secondary | ICD-10-CM | POA: Diagnosis not present

## 2020-05-09 DIAGNOSIS — Z794 Long term (current) use of insulin: Secondary | ICD-10-CM

## 2020-05-09 DIAGNOSIS — E0800 Diabetes mellitus due to underlying condition with hyperosmolarity without nonketotic hyperglycemic-hyperosmolar coma (NKHHC): Secondary | ICD-10-CM | POA: Diagnosis not present

## 2020-05-09 NOTE — Telephone Encounter (Signed)
Dr. Kelton Pillar,  [name] gave me update today that her  Rybelsus will be $400 per month.  In addition she states that she is having 3 or more loose stools a day with Rybelsus.  Formally she had tried varying dosages of Metformin and even had diarrhea with just 1- 500 mg tablet daily.  She was formally on Onglyza and currently on low-dose Actos 15 mg.  I advised patient I would give you an update and get direction from you on potential changes.  Thanks for your help with our patients.  Carolyn Wilson

## 2020-05-09 NOTE — Progress Notes (Signed)
Subjective:    Patient ID: Carolyn Wilson, female    DOB: 04-07-1971, 49 y.o.   MRN: [id number]  HPI  Pt in for follow up.  She has seen Dr. Kelton Pillar. She prescribed rybelsus and rx would cost her $400. Pt states med leaving a funny taste in her mouth and it is causing her diarrhea 3 times a day. Same as or maybe worse than metformin.  Pt was on actos briefly for short time for 2 weeks.   Dr. Kelton Pillar took her off ongylza.  Pt still has 2 weeks of rybelsus left.  .   Review of Systems  Constitutional: Negative for chills, fatigue and fever.  HENT: Negative for congestion.   Respiratory: Negative for cough, chest tightness, shortness of breath and wheezing.   Cardiovascular: Negative for chest pain and palpitations.  Gastrointestinal: Positive for diarrhea. Negative for abdominal pain, nausea and vomiting.       Loose stools with rybelsus.  Endocrine: Negative for polydipsia, polyphagia and polyuria.  Genitourinary: Negative for dysuria.  Musculoskeletal: Negative for back pain and neck pain.  Skin: Negative for rash.  Neurological: Negative for dizziness and headaches.  Hematological: Negative for adenopathy. Does not bruise/bleed easily.  Psychiatric/Behavioral: Negative for behavioral problems, decreased concentration and dysphoric mood. The patient is not nervous/anxious.      Past Medical History:  Diagnosis Date  . Abnormal Pap smear of cervix    h/o colposcopy/biopsy  . Allergy   . Asthma    HX  . Breast cancer ([place]) 2007  . Cancer ([place]) [id number]   ovarian  . Diabetes mellitus without complication ([place]) 66/4403    diagnosed by PCP, [name]   . GERD (gastroesophageal reflux disease)   . Hyperlipemia    on medication   . PONV (postoperative nausea and vomiting)      Social History   Socioeconomic History  . Marital status: Married    Spouse name: Not on file  . Number of children: Not on file  . Years of education: Not on file  .  Highest education level: Not on file  Occupational History  . Not on file  Tobacco Use  . Smoking status: Never Smoker  . Smokeless tobacco: Never Used  . Tobacco comment: NEVER USED TOBACCO  Vaping Use  . Vaping Use: Never used  Substance and Sexual Activity  . Alcohol use: Yes    Alcohol/week: 1.0 standard drink    Types: 1 Standard drinks or equivalent per week  . Drug use: No  . Sexual activity: Yes    Partners: Male    Birth control/protection: Surgical  Other Topics Concern  . Not on file  Social History Narrative  . Not on file   Social Determinants of Health   Financial Resource Strain:   . Difficulty of Paying Living Expenses: Not on file  Food Insecurity:   . Worried About Charity fundraiser in the Last Year: Not on file  . Ran Out of Food in the Last Year: Not on file  Transportation Needs:   . Lack of Transportation (Medical): Not on file  . Lack of Transportation (Non-Medical): Not on file  Physical Activity:   . Days of Exercise per Week: Not on file  . Minutes of Exercise per Session: Not on file  Stress:   . Feeling of Stress : Not on file  Social Connections:   . Frequency of Communication with Friends and Family: Not on file  . Frequency of Social  Gatherings with Friends and Family: Not on file  . Attends Religious Services: Not on file  . Active Member of Clubs or Organizations: Not on file  . Attends Archivist Meetings: Not on file  . Marital Status: Not on file  Intimate Partner Violence:   . Fear of Current or Ex-Partner: Not on file  . Emotionally Abused: Not on file  . Physically Abused: Not on file  . Sexually Abused: Not on file    Past Surgical History:  Procedure Laterality Date  . APPENDECTOMY     age [age]  . BREAST EXCISIONAL BIOPSY Left 08/2019  . BREAST LUMPECTOMY Left 2008   left breast  . CHOLECYSTECTOMY  2000  . ENDOMETRIAL ABLATION  2009  . LAPAROSCOPIC SALPINGO OOPHERECTOMY Left 1996   with right tubal  ligation  . RADIOACTIVE SEED GUIDED EXCISIONAL BREAST BIOPSY Left 09/13/2019   Procedure: RADIOACTIVE SEED GUIDED EXCISIONAL LEFT BREAST BIOPSY;  Surgeon: [name], MD;  Location: [facility];  Service: General;  Laterality: Left;  . TUBAL LIGATION      Family History  Problem Relation Age of Onset  . Diabetes Mother   . Colon cancer Father   . Breast cancer Maternal Aunt   . Ovarian cancer Maternal Aunt   . Ovarian cancer Cousin   . Colon cancer Cousin   . Melanoma Cousin   . Breast cancer Maternal Grandmother   . Cancer Other        breast and ovarian-maternal side    Allergies  Allergen Reactions  . Latex Rash    Causes blisters  . Lidocaine Nausea And Vomiting  . Peanut-Containing Drug Products Anaphylaxis  . Sulfa Antibiotics Shortness Of Breath and Rash  . Reglan [Metoclopramide] Tinitus  . Metformin And Related Diarrhea  . Adhesive [Tape] Rash  . Ciprofloxacin Rash  . Sulfur Rash    Current Outpatient Medications on File Prior to Visit  Medication Sig Dispense Refill  . aspirin 81 MG tablet Take 81 mg by mouth daily.    [name] EPINEPHrine 0.3 mg/0.3 mL IJ SOAJ injection Inject once into muscle for any allergic reaction to peanut exposure 2 Device 1  . exemestane (AROMASIN) 25 MG tablet Take 1 tablet (25 mg total) by mouth daily after breakfast. 30 tablet 6  . ezetimibe (ZETIA) 10 MG tablet TAKE ONE TABLET BY MOUTH ONE TIME DAILY 30 tablet 5  . gabapentin (NEURONTIN) 300 MG capsule Take 1 capsule (300 mg total) by mouth at bedtime. 90 capsule 2  . Lancets (ONETOUCH ULTRASOFT) lancets Check blood sugar daily as directed. 100 each 12  . NONFORMULARY OR COMPOUNDED ITEM Vitamin E vaginal suppositories 200u/ml.  One pv three times weekly. 36 each 3  . omeprazole (PRILOSEC) 20 MG capsule TAKE ONE CAPSULE BY MOUTH ONE TIME DAILY 90 capsule 3  . ondansetron (ZOFRAN-ODT) 8 MG disintegrating tablet DISSOLVE ONE TABLET BY MOUTH EVERY 8 HOURS AS NEEDED FOR  NAUSEA AND VOMITING 20 tablet 0  . ONETOUCH VERIO test strip USE TO TEST ONE TIME DAILY AS DIRECTED 100 strip 11  . pioglitazone (ACTOS) 15 MG tablet TAKE ONE TABLET BY MOUTH ONE TIME DAILY 30 tablet 0  . Semaglutide (RYBELSUS) 3 MG TABS Take 3 mg by mouth daily. 30 tablet 6  . vitamin C (ASCORBIC ACID) 500 MG tablet Take 500 mg by mouth daily.    . metoprolol succinate (TOPROL-XL) 25 MG 24 hr tablet Take 25 mg by mouth daily.    . metoprolol  tartrate (LOPRESSOR) 25 MG tablet Take 1 tablet (25 mg total) by mouth 2 (two) times daily. 180 tablet 3  . ONGLYZA 5 MG TABS tablet Take 5 mg by mouth daily.     No current facility-administered medications on file prior to visit.    BP 97/73   Pulse 82   Resp 18   Ht 5\' 1"  (1.549 m)   Wt 169 lb 12.8 oz (77 kg)   LMP 04/27/2017 Comment: spotting  SpO2 98%   BMI 32.08 kg/m       Objective:   Physical Exam  General- No acute distress. Pleasant patient. Neck- Full range of motion, no jvd Lungs- Clear, even and unlabored. Heart- regular rate and rhythm. Neurologic- CNII- XII grossly intact. Abdomen- soft, nontender, nondistended, +bowel sounds. No rebound or guarding.    Assessment & Plan:  Diabetes with history of Gi side effect with metformin. A1c 7.2 recently. Recent diarrhea with rybelsus same or worse per pt also will cost her $400 per month. Continue actos. Will send your endocrinologist a note regarding cost and side effects. Will see which direction/what she advises.   For high cholesterol continue zetia.    Follow up with me in 3 months and as regularly scheduled with endocrinologist.  Time spent with patient today was  30  minutes which consisted of chart review, discussing diagnosis,  treatment options, answering questions and documentation.

## 2020-05-10 ENCOUNTER — Other Ambulatory Visit: Payer: Self-pay | Admitting: Internal Medicine

## 2020-05-10 NOTE — Patient Instructions (Addendum)
Diabetes with history of Gi side effect with metformin. A1c 7.2 recently. Recent diarrhea with rybelsus same or worse per pt also will cost her $400 per month. Continue actos. Will send your endocrinologist a note regarding cost and side effects. Will see which direction/what she advises.   For high cholesterol continue zetia.    Follow up with me in 3 months and as regularly scheduled with endocrinologist.

## 2020-05-24 NOTE — Progress Notes (Addendum)
48 y.o. G48P0101 Married White or Caucasian female here for annual exam.  Doing well.  Has been on metformin which caused so many issues with diarrhea.  She has issues with pink with wiping a lot.  She's not off this and on Rybelsus 66m, just for 3 weeks.  But GI function finally feels back to normal.    Last FSH was 61.  Knows to call if has vaginal bleeding.  H/o endometrial ablation.  I have not been successful with endometrial biopsy.    Is now on exemestane due to personal increased risk of breast cancer which is >25% with [employer].  Vit D was 42.  BMD discussed today.    Now seeing Dr. SKelton Pillar endocrinology.  Last HbA1C was 7.2 and was done 04/05/2020.  Has follow up scheduled in January.  Had tachycardia with exercise.  Had coronary calcium CT and this was normal.    Patient's last menstrual period was 04/27/2017.          Sexually active: Yes.    The current method of family planning is tubal ligation.    Exercising: Yes.    walking and running  Smoker:  no  Health Maintenance: Pap:  07/27/17 Neg. HR HPV:neg              11/09/14 Neg  History of abnormal Pap:  yes MMG:  01/18/20  Cat C - Birads 1 Neg -- biopsy done on right breast August 2021 -- see EPIC Colonoscopy:  2017 polyps - f/u in 5 years  BMD:   Never   TDaP:  2016  Pneumonia vaccine(s):  2013 Shingrix:   NA Hep C testing: n/a Screening Labs: PCP    reports that she has never smoked. She has never used smokeless tobacco. She reports current alcohol use of about 1.0 standard drink of alcohol per week. She reports that she does not use drugs.  Past Medical History:  Diagnosis Date  . Abnormal Pap smear of cervix    h/o colposcopy/biopsy  . Allergy   . Asthma    HX  . Breast cancer ([facility]  . Cancer ([facility] [id number]  ovarian  . Diabetes mellitus without complication ([place] 099/2426   diagnosed by PCP, Dr. SHoover Brunette  . GERD (gastroesophageal reflux disease)   . Hyperlipemia    on medication   . PONV  (postoperative nausea and vomiting)     Past Surgical History:  Procedure Laterality Date  . APPENDECTOMY     age [age] . BREAST EXCISIONAL BIOPSY Left 08/2019  . BREAST LUMPECTOMY Left 2008   left breast  . CHOLECYSTECTOMY  2000  . ENDOMETRIAL ABLATION  2009  . LAPAROSCOPIC SALPINGO OOPHERECTOMY Left 1996   with right tubal ligation  . RADIOACTIVE SEED GUIDED EXCISIONAL BREAST BIOPSY Left 09/13/2019   Procedure: RADIOACTIVE SEED GUIDED EXCISIONAL LEFT BREAST BIOPSY;  Surgeon: WRolm Bookbinder MD;  [facility] [facility]  Service: General;  Laterality: Left;  . TUBAL LIGATION      Current Outpatient Medications  Medication Sig Dispense Refill  . aspirin 81 MG tablet Take 81 mg by mouth daily.    .Marland KitchenEPINEPHrine 0.3 mg/0.3 mL IJ SOAJ injection Inject once into muscle for any allergic reaction to peanut exposure 2 Device 1  . exemestane (AROMASIN) 25 MG tablet Take 1 tablet (25 mg total) by mouth daily after breakfast. 30 tablet 6  . ezetimibe (ZETIA) 10 MG tablet TAKE ONE TABLET BY MOUTH ONE TIME  DAILY 30 tablet 5  . gabapentin (NEURONTIN) 300 MG capsule Take 1 capsule (300 mg total) by mouth at bedtime. 90 capsule 2  . Lancets (ONETOUCH ULTRASOFT) lancets Check blood sugar daily as directed. 100 each 12  . metoprolol tartrate (LOPRESSOR) 25 MG tablet Take 1 tablet (25 mg total) by mouth 2 (two) times daily. 180 tablet 3  . NONFORMULARY OR COMPOUNDED ITEM Vitamin E vaginal suppositories 200u/ml.  One pv three times weekly. 36 each 3  . omeprazole (PRILOSEC) 20 MG capsule TAKE ONE CAPSULE BY MOUTH ONE TIME DAILY 90 capsule 3  . ondansetron (ZOFRAN-ODT) 8 MG disintegrating tablet DISSOLVE ONE TABLET BY MOUTH EVERY 8 HOURS AS NEEDED FOR NAUSEA AND VOMITING 20 tablet 0  . ONETOUCH VERIO test strip USE TO TEST ONE TIME DAILY AS DIRECTED 100 strip 11  . pioglitazone (ACTOS) 15 MG tablet TAKE ONE TABLET BY MOUTH ONE TIME DAILY 30 tablet 0  . RYBELSUS 3 MG TABS Take 1 tablet by  mouth daily.    . vitamin C (ASCORBIC ACID) 500 MG tablet Take 500 mg by mouth daily.     No current facility-administered medications for this visit.    Family History  Problem Relation Age of Onset  . Diabetes Mother   . Colon cancer Father   . Breast cancer Maternal Aunt   . Ovarian cancer Maternal Aunt   . Ovarian cancer Cousin   . Colon cancer Cousin   . Melanoma Cousin   . Breast cancer Maternal Grandmother   . Cancer Other        breast and ovarian-maternal side    Review of Systems  All other systems reviewed and are negative.   Exam:   BP 108/60 (BP Location: Right Arm, Patient Position: Sitting, Cuff Size: Normal)   Pulse 88   Resp 16   Ht 5' 4" (1.626 m)   LMP 04/27/2017 Comment: spotting  BMI 29.15 kg/m   Height: 5' 4" (162.6 cm)  General appearance: alert, cooperative and appears stated age Head: Normocephalic, without obvious abnormality, atraumatic Neck: no adenopathy, supple, symmetrical, trachea midline and thyroid normal to inspection and palpation Lungs: clear to auscultation bilaterally Breasts: normal appearance, no masses or tenderness Heart: regular rate and rhythm Abdomen: soft, non-tender; bowel sounds normal; no masses,  no organomegaly Extremities: extremities normal, atraumatic, no cyanosis or edema Skin: Skin color, texture, turgor normal. No rashes or lesions Lymph nodes: Cervical, supraclavicular, and axillary nodes normal. No abnormal inguinal nodes palpated Neurologic: Grossly normal   Pelvic: External genitalia:  no lesions              Urethra:  normal appearing urethra with no masses, tenderness or lesions              Bartholins and Skenes: normal                 Vagina: normal appearing vagina with normal color and discharge, no lesions              Cervix: no lesions              Pap taken: Yes.   Bimanual Exam:  Uterus:  normal size, contour, position, consistency, mobility, non-tender              Adnexa: normal adnexa and  no mass, fullness, tenderness               Rectovaginal: Confirms  Anus:  normal sphincter tone, no lesions  Chaperone, Olene Floss, CMA, was present for exam.  A:  Well Woman with normal exam H/o amenorrhea due to endometrial ablation PMP per 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 pap smear with HR HPV obtained today. Will reach out to Dr. Marin Olp about whether she needs to have a baseline BMD due to aromasin (Addendum:  He agreed.  This will be ordered for pt.) Colonoscopy due in 2022 Followed by Mackie Pai and Dr. Kelton Pillar as well.  Lab work planned with Dr. Kelton Pillar in Jan for repeat HbA1C Vaccines reviewed and up to date. Return annually or prn

## 2020-05-25 ENCOUNTER — Encounter: Payer: Self-pay | Admitting: Obstetrics & Gynecology

## 2020-05-25 ENCOUNTER — Telehealth: Payer: Self-pay

## 2020-05-25 ENCOUNTER — Ambulatory Visit: Payer: 59 | Admitting: Obstetrics & Gynecology

## 2020-05-25 ENCOUNTER — Other Ambulatory Visit (HOSPITAL_COMMUNITY)
Admission: RE | Admit: 2020-05-25 | Discharge: 2020-05-25 | Disposition: A | Discharge status: Home / Self Care | Payer: 59 | Source: Ambulatory Visit | Attending: Obstetrics & Gynecology | Admitting: Obstetrics & Gynecology

## 2020-05-25 ENCOUNTER — Other Ambulatory Visit: Payer: Self-pay

## 2020-05-25 VITALS — BP 108/60 | HR 88 | Resp 16 | Ht 64.0 in

## 2020-05-25 DIAGNOSIS — Z01419 Encounter for gynecological examination (general) (routine) without abnormal findings: Secondary | ICD-10-CM | POA: Diagnosis not present

## 2020-05-25 DIAGNOSIS — Z9189 Other specified personal risk factors, not elsewhere classified: Secondary | ICD-10-CM | POA: Diagnosis not present

## 2020-05-25 DIAGNOSIS — Z124 Encounter for screening for malignant neoplasm of cervix: Secondary | ICD-10-CM | POA: Insufficient documentation

## 2020-05-25 DIAGNOSIS — Z79899 Other long term (current) drug therapy: Secondary | ICD-10-CM

## 2020-05-25 MED ORDER — NONFORMULARY OR COMPOUNDED ITEM
4 refills | Status: DC
Start: 2020-05-25 — End: 2020-10-26

## 2020-05-25 MED ORDER — GABAPENTIN 300 MG PO CAPS
300.0000 mg | ORAL_CAPSULE | Freq: Every day | ORAL | 4 refills | Status: DC
Start: 2020-05-25 — End: 2021-06-10

## 2020-05-25 NOTE — Telephone Encounter (Signed)
Tried calling patient regarding scheduling BMD appointment with [facility]. No answer, left message for patient to call me back.

## 2020-05-25 NOTE — Addendum Note (Signed)
Addended by: Megan Salon on: 05/25/2020 12:36 PM   Modules accepted: Orders

## 2020-05-28 LAB — CYTOLOGY - PAP
Adequacy: ABSENT
Comment: NEGATIVE
Diagnosis: NEGATIVE
High risk HPV: NEGATIVE

## 2020-05-28 NOTE — Telephone Encounter (Signed)
Patient returned call. Verified with patient appropriate days and time to schedule BMD with the breast center.  Appointment scheduled for bone density 05/31/20 at 8:00a. Patient notified, verbalizes understanding, and is agreeable.   Okay to close encounter.

## 2020-05-30 ENCOUNTER — Encounter: Payer: Self-pay | Admitting: Gastroenterology

## 2020-05-31 ENCOUNTER — Other Ambulatory Visit: Payer: Self-pay

## 2020-05-31 ENCOUNTER — Ambulatory Visit
Admission: RE | Admit: 2020-05-31 | Discharge: 2020-05-31 | Disposition: A | Discharge status: Home / Self Care | Payer: 59 | Source: Ambulatory Visit | Attending: Obstetrics & Gynecology | Admitting: Obstetrics & Gynecology

## 2020-05-31 DIAGNOSIS — Z9189 Other specified personal risk factors, not elsewhere classified: Secondary | ICD-10-CM

## 2020-05-31 DIAGNOSIS — Z79899 Other long term (current) drug therapy: Secondary | ICD-10-CM

## 2020-06-01 ENCOUNTER — Encounter: Payer: Self-pay | Admitting: Gastroenterology

## 2020-06-05 ENCOUNTER — Other Ambulatory Visit: Payer: Self-pay | Admitting: Medical

## 2020-06-07 ENCOUNTER — Ambulatory Visit: Payer: 59 | Admitting: Gastroenterology

## 2020-06-27 ENCOUNTER — Encounter: Payer: Self-pay | Admitting: Hematology & Oncology

## 2020-06-27 ENCOUNTER — Other Ambulatory Visit: Payer: Self-pay

## 2020-06-27 ENCOUNTER — Other Ambulatory Visit: Payer: Self-pay | Admitting: *Deleted

## 2020-06-27 ENCOUNTER — Inpatient Hospital Stay: Payer: 59 | Attending: Hematology & Oncology

## 2020-06-27 ENCOUNTER — Inpatient Hospital Stay (HOSPITAL_BASED_OUTPATIENT_CLINIC_OR_DEPARTMENT_OTHER): Payer: 59 | Admitting: Hematology & Oncology

## 2020-06-27 VITALS — BP 116/85 | HR 98 | Temp 98.2°F | Resp 18 | Wt 168.0 lb

## 2020-06-27 DIAGNOSIS — Z9889 Other specified postprocedural states: Secondary | ICD-10-CM

## 2020-06-27 DIAGNOSIS — R42 Dizziness and giddiness: Secondary | ICD-10-CM | POA: Insufficient documentation

## 2020-06-27 DIAGNOSIS — N951 Menopausal and female climacteric states: Secondary | ICD-10-CM | POA: Insufficient documentation

## 2020-06-27 DIAGNOSIS — Z8543 Personal history of malignant neoplasm of ovary: Secondary | ICD-10-CM | POA: Diagnosis not present

## 2020-06-27 DIAGNOSIS — Z79899 Other long term (current) drug therapy: Secondary | ICD-10-CM | POA: Insufficient documentation

## 2020-06-27 DIAGNOSIS — N6092 Unspecified benign mammary dysplasia of left breast: Secondary | ICD-10-CM | POA: Insufficient documentation

## 2020-06-27 DIAGNOSIS — N6489 Other specified disorders of breast: Secondary | ICD-10-CM | POA: Insufficient documentation

## 2020-06-27 DIAGNOSIS — D270 Benign neoplasm of right ovary: Secondary | ICD-10-CM

## 2020-06-27 LAB — CMP (CANCER CENTER ONLY)
ALT: 36 U/L (ref 0–44)
AST: 24 U/L (ref 15–41)
Albumin: 4.4 g/dL (ref 3.5–5.0)
Alkaline Phosphatase: 80 U/L (ref 38–126)
Anion gap: 8 (ref 5–15)
BUN: 12 mg/dL (ref 6–20)
CO2: 30 mmol/L (ref 22–32)
Calcium: 9.9 mg/dL (ref 8.9–10.3)
Chloride: 102 mmol/L (ref 98–111)
Creatinine: 1.11 mg/dL — ABNORMAL HIGH (ref 0.44–1.00)
GFR, Estimated: >60 mL/min (ref >60)
Glucose, Bld: 190 mg/dL — ABNORMAL HIGH (ref 70–99)
Potassium: 4 mmol/L (ref 3.5–5.1)
Sodium: 140 mmol/L (ref 135–145)
Total Bilirubin: 0.5 mg/dL (ref 0.3–1.2)
Total Protein: 7.4 g/dL (ref 6.5–8.1)

## 2020-06-27 LAB — CBC WITH DIFFERENTIAL (CANCER CENTER ONLY)
Abs Immature Granulocytes: 0.03 10*3/uL (ref 0.00–0.07)
Basophils Absolute: 0.1 10*3/uL (ref 0.0–0.1)
Basophils Relative: 1 %
Eosinophils Absolute: 0.1 10*3/uL (ref 0.0–0.5)
Eosinophils Relative: 2 %
HCT: 42.9 % (ref 36.0–46.0)
Hemoglobin: 13.8 g/dL (ref 12.0–15.0)
Immature Granulocytes: 0 %
Lymphocytes Relative: 33 %
Lymphs Abs: 3 10*3/uL (ref 0.7–4.0)
MCH: 29.4 pg (ref 26.0–34.0)
MCHC: 32.2 g/dL (ref 30.0–36.0)
MCV: 91.5 fL (ref 80.0–100.0)
Monocytes Absolute: 0.5 10*3/uL (ref 0.1–1.0)
Monocytes Relative: 5 %
Neutro Abs: 5.5 10*3/uL (ref 1.7–7.7)
Neutrophils Relative %: 59 %
Platelet Count: 319 10*3/uL (ref 150–400)
RBC: 4.69 MIL/uL (ref 3.87–5.11)
RDW: 13.4 % (ref 11.5–15.5)
WBC Count: 9.2 10*3/uL (ref 4.0–10.5)
nRBC: 0 % (ref 0.0–0.2)

## 2020-06-27 LAB — LACTATE DEHYDROGENASE: LDH: 158 U/L (ref 98–192)

## 2020-06-27 LAB — TSH: TSH: 2.089 u[IU]/mL (ref 0.308–3.960)

## 2020-06-27 NOTE — Progress Notes (Signed)
Hematology and Oncology Follow Up Visit  Carolyn Wilson [id number] 02-19-1971 49 y.o. 06/27/2020   Principle Diagnosis:  1. History of Sertoli-Leydig tumor of the left ovary  2. Strong family history of breast and ovarian cancer - BRCA 1/2 and p53 negative 3. 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 Complex Sclerosing Lesion with usual ductal hyperplasia of the LEFT breast (central)  Current Therapy:         Aromasin 25 mg mg po q day - start on 03/21/2020   Interim History:  [name] is here today for follow-up.  Unfortunately, if she may be having some problems with the Aromasin.  For the past 2 or 3 weeks, she has been having some dizziness.  She has been having some hot flashes.  She has been feeling some cold.  She has had some shivers.  I am not sure if this is from the Aromasin.  I told her to stop the Aromasin for right now.  I do not see a problem with her stopping the Aromasin for about 6 weeks.  Otherwise she seems to be doing pretty well.  Her blood sugars have not been low.  She is try to manage these cautiously.  She had no problem with Thanksgiving.  She has had no issues with thyroid as far she knows.  We will check a TSH on her.  She is still working.  She works in  Engineer, mining for [employer]  She has not noted any change with the breast.  She has had no nausea or vomiting.  There has been no diarrhea.  She has had no issues with her bladder.  There is been no dysuria.  She has had no leg swelling.  Overall, her performance status right now is ECOG 1.   Medications:  Allergies as of 06/27/2020      Reactions   Latex Rash   Causes blisters   Lidocaine Nausea And Vomiting   Peanut-containing Drug Products Anaphylaxis   Sulfa Antibiotics Shortness Of Breath, Rash   Reglan [metoclopramide] Tinitus   Metformin And Related Diarrhea   Adhesive [tape] Rash   Ciprofloxacin Rash   Sulfur Rash       Medication List       Accurate as of June 27, 2020  8:33 AM. If you have any questions, ask your nurse or doctor.        aspirin 81 MG tablet Take 81 mg by mouth daily.   EPINEPHrine 0.3 mg/0.3 mL Soaj injection Commonly known as: EPI-PEN Inject once into muscle for any allergic reaction to peanut exposure   exemestane 25 MG tablet Commonly known as: AROMASIN Take 1 tablet (25 mg total) by mouth daily after breakfast.   ezetimibe 10 MG tablet Commonly known as: ZETIA TAKE ONE TABLET BY MOUTH ONE TIME DAILY   gabapentin 300 MG capsule Commonly known as: Neurontin Take 1 capsule (300 mg total) by mouth at bedtime.   metoprolol tartrate 25 MG tablet Commonly known as: LOPRESSOR Take 1 tablet (25 mg total) by mouth 2 (two) times daily.   NONFORMULARY OR COMPOUNDED ITEM Vitamin E vaginal suppositories 200u/ml.  One pv three times weekly.   omeprazole 20 MG capsule Commonly known as: PRILOSEC TAKE ONE CAPSULE BY MOUTH ONE TIME DAILY   ondansetron 8 MG disintegrating tablet Commonly known as: ZOFRAN-ODT DISSOLVE ONE TABLET BY MOUTH EVERY 8 HOURS AS NEEDED FOR NAUSEA AND VOMITING   onetouch ultrasoft lancets Check blood sugar daily  as directed.   OneTouch Verio test strip Generic drug: glucose blood USE TO TEST ONE TIME DAILY AS DIRECTED   Onglyza 5 MG Tabs tablet Generic drug: saxagliptin HCl Take 5 mg by mouth daily.   pioglitazone 15 MG tablet Commonly known as: ACTOS TAKE ONE TABLET BY MOUTH ONE TIME DAILY   Rybelsus 3 MG Tabs Generic drug: Semaglutide Take 1 tablet by mouth daily.   vitamin C 500 MG tablet Commonly known as: ASCORBIC ACID Take 500 mg by mouth daily.       Allergies:  Allergies  Allergen Reactions  . Latex Rash    Causes blisters  . Lidocaine Nausea And Vomiting  . Peanut-Containing Drug Products Anaphylaxis  . Sulfa Antibiotics Shortness Of Breath and Rash  . Reglan [Metoclopramide] Tinitus  . Metformin And Related  Diarrhea  . Adhesive [Tape] Rash  . Ciprofloxacin Rash  . Sulfur Rash    Past Medical History, Surgical history, Social history, and Family History were reviewed and updated.  Review of Systems: Review of Systems  Constitutional: Negative.   HENT: Negative.   Eyes: Negative.   Respiratory: Negative.   Cardiovascular: Negative.   Gastrointestinal: Negative.   Genitourinary: Negative.   Musculoskeletal: Negative.   Skin: Negative.   Neurological: Negative.   Endo/Heme/Allergies: Negative.   Psychiatric/Behavioral: Negative.    [name]   Physical Exam:  weight is 168 lb (76.2 kg). Her oral temperature is 98.2 F (36.8 C). Her blood pressure is 116/85 and her pulse is 98. Her respiration is 18 and oxygen saturation is 99%.   Wt Readings from Last 3 Encounters:  06/27/20 168 lb (76.2 kg)  05/09/20 169 lb 12.8 oz (77 kg)  04/24/20 176 lb (79.8 kg)    Physical Exam Vitals reviewed.  Constitutional:      Comments: Her breast exam shows right breast with no masses, edema or erythema. She has the biopsy site at about the 10 o'clock position on the right breast. There is no erythema or ecchymosis associated with this. There is no right axillary adenopathy.  Left breast shows the lumpectomy scar at the edge of the areola at 3:00.  There is no erythema or warmth.  There is no discharge noted.  There is no tenderness to palpation.  There is no left axillary adenopathy.  HENT:     Head: Normocephalic and atraumatic.  Eyes:     Pupils: Pupils are equal, round, and reactive to light.  Cardiovascular:     Rate and Rhythm: Normal rate and regular rhythm.     Heart sounds: Normal heart sounds.  Pulmonary:     Effort: Pulmonary effort is normal.     Breath sounds: Normal breath sounds.  Abdominal:     General: Bowel sounds are normal.     Palpations: Abdomen is soft.  Musculoskeletal:        General: No tenderness or deformity. Normal range of motion.     Cervical back: Normal range of  motion.  Lymphadenopathy:     Cervical: No cervical adenopathy.  Skin:    General: Skin is warm and dry.     Findings: No erythema or rash.  Neurological:     Mental Status: She is alert and oriented to person, place, and time.  Psychiatric:        Behavior: Behavior normal.        Thought Content: Thought content normal.        Judgment: Judgment normal.      Lab Results  Component Value Date   WBC 9.2 06/27/2020   HGB 13.8 06/27/2020   HCT 42.9 06/27/2020   MCV 91.5 06/27/2020   PLT 319 06/27/2020   No results found for: FERRITIN, IRON, TIBC, UIBC, IRONPCTSAT Lab Results  Component Value Date   RBC 4.69 06/27/2020   No results found for: KPAFRELGTCHN, LAMBDASER, KAPLAMBRATIO No results found for: IGGSERUM, IGA, IGMSERUM No results found for: Odetta Pink, SPEI   Chemistry      Component Value Date/Time   NA 140 06/27/2020 0759   NA 142 11/01/2019 0902   NA 144 03/11/2017 0822   K 4.0 06/27/2020 0759   K 4.5 03/11/2017 0822   CL 102 06/27/2020 0759   CL 104 03/11/2017 0822   CO2 30 06/27/2020 0759   CO2 29 03/11/2017 0822   BUN 12 06/27/2020 0759   BUN 14 11/01/2019 0902   BUN 9 03/11/2017 0822   CREATININE 1.11 (H) 06/27/2020 0759   CREATININE 0.82 04/05/2020 0732      Component Value Date/Time   CALCIUM 9.9 06/27/2020 0759   CALCIUM 9.2 03/11/2017 0822   ALKPHOS 80 06/27/2020 0759   ALKPHOS 65 03/11/2017 0822   AST 24 06/27/2020 0759   ALT 36 06/27/2020 0759   ALT 25 03/11/2017 0822   BILITOT 0.5 06/27/2020 0759       Impression and Plan: Ms. 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   She was diagnosed with stromal hyperplasia of the right breast and ductal hyperplasia of the left breast in December 2020.    She had a left breast lumpectomy with radioactive seed placement in February 2021.    Now, she has a similar situation in the right breast.  I will go ahead and again have her stop the Aromasin.  I nausea problem with her stopping this.  We will go ahead and get her back in 4 months.  She will call us in about a month or so to let us know if her symptoms are better.  If they are, we may switch the Aromasin to Femara or Arimidex.  She and her family are going down to [place] in March.  I am sure she will have a wonderful time down there.    Volanda Napoleon, MD 12/8/20218:33 AM

## 2020-06-28 LAB — LUTEINIZING HORMONE: LH: 65.6 m[IU]/mL

## 2020-06-28 LAB — FOLLICLE STIMULATING HORMONE: FSH: 81.8 m[IU]/mL

## 2020-07-03 ENCOUNTER — Other Ambulatory Visit: Payer: Self-pay | Admitting: Medical

## 2020-07-07 LAB — ESTRADIOL, ULTRA SENS: Estradiol, Sensitive: 4 pg/mL

## 2020-07-17 ENCOUNTER — Other Ambulatory Visit: Payer: Self-pay | Admitting: Obstetrics & Gynecology

## 2020-07-17 ENCOUNTER — Encounter: Payer: Self-pay | Admitting: Hematology & Oncology

## 2020-07-17 DIAGNOSIS — Z803 Family history of malignant neoplasm of breast: Secondary | ICD-10-CM

## 2020-07-17 DIAGNOSIS — Z9189 Other specified personal risk factors, not elsewhere classified: Secondary | ICD-10-CM

## 2020-07-17 DIAGNOSIS — N6489 Other specified disorders of breast: Secondary | ICD-10-CM

## 2020-07-17 NOTE — Progress Notes (Signed)
MRI of breast ordered

## 2020-07-24 ENCOUNTER — Encounter: Payer: Self-pay | Admitting: Medical

## 2020-07-24 ENCOUNTER — Other Ambulatory Visit: Payer: Self-pay

## 2020-07-24 ENCOUNTER — Ambulatory Visit: Payer: 59 | Admitting: Medical

## 2020-07-24 VITALS — BP 110/70 | HR 98 | Temp 99.6°F | Resp 18 | Ht 64.5 in | Wt 165.8 lb

## 2020-07-24 DIAGNOSIS — J019 Acute sinusitis, unspecified: Secondary | ICD-10-CM | POA: Diagnosis not present

## 2020-07-24 DIAGNOSIS — R0981 Nasal congestion: Secondary | ICD-10-CM

## 2020-07-24 DIAGNOSIS — R42 Dizziness and giddiness: Secondary | ICD-10-CM

## 2020-07-24 MED ORDER — FLUTICASONE PROPIONATE 50 MCG/ACT NA SUSP: 2.0000 | Freq: Every day | NASAL | 1 refills | Status: DC

## 2020-07-24 MED ORDER — AZITHROMYCIN 250 MG PO TABS: ORAL_TABLET | ORAL | 0 refills | Status: DC

## 2020-07-24 NOTE — Progress Notes (Signed)
Subjective:    Patient ID: Carolyn Wilson, female    DOB: 21-Aug-1970, 50 y.o.   MRN: [id number]  HPI  Pt in with some sinus pressure. Pt states she just got tested yesterday and negative. Getting tested twice as a precaution per her job. Pt has ear pressure and some palate. Pt has hx of sinus infection in the past.  Pt also has some dizziness that has occurred since December. Oncologist thought maybe related to one of speciality meds.  Dizziness is random but does not more when stands up or when exercising doing burpies. But it is not every day. Only occasional.   Pt has been vaccinated and boosted.  Pt bp is 111/86 today. No bp checks at time. Pt is on lopressor 25 mg twice daily. Pt has hx of tachycardia.  No anemia and no obvious dehydration.    Pt has diabetes. Pt is seeing Dr. Kelton Pillar. Pt states Rybelsus has become cheaper. She is still on actos. Will see endocrinologist next week.   Pt has high cholesterol. On zetia can't tolerate statin.    Review of Systems  Constitutional: Negative for chills, fatigue and fever.  HENT: Positive for congestion, sinus pressure and sinus pain. Negative for sneezing and tinnitus.   Respiratory: Negative for cough, chest tightness, shortness of breath and wheezing.   Cardiovascular: Negative for chest pain and palpitations.  Gastrointestinal: Negative for abdominal pain, constipation, nausea and vomiting.  Genitourinary: Negative for dysuria, flank pain and frequency.  Musculoskeletal: Negative for back pain.  Skin: Negative for rash.  Neurological: Negative for dizziness, syncope, speech difficulty, weakness, numbness and headaches.  Hematological: Negative for adenopathy. Does not bruise/bleed easily.  Psychiatric/Behavioral: Negative for behavioral problems, decreased concentration and sleep disturbance. The patient is not nervous/anxious.     Past Medical History:  Diagnosis Date  . Abnormal Pap smear of cervix    h/o  colposcopy/biopsy  . Allergy   . Asthma    HX  . Breast cancer ([place]) 2007  . Cancer ([place]) [id number]   ovarian  . Diabetes mellitus without complication ([place]) [id number]    diagnosed by PCP, [name]   . GERD (gastroesophageal reflux disease)   . Hyperlipemia    on medication   . PONV (postoperative nausea and vomiting)      Social History   Socioeconomic History  . Marital status: Married    Spouse name: Not on file  . Number of children: Not on file  . Years of education: Not on file  . Highest education level: Not on file  Occupational History  . Not on file  Tobacco Use  . Smoking status: Never Smoker  . Smokeless tobacco: Never Used  . Tobacco comment: NEVER USED TOBACCO  Vaping Use  . Vaping Use: Never used  Substance and Sexual Activity  . Alcohol use: Yes    Alcohol/week: 1.0 standard drink    Types: 1 Standard drinks or equivalent per week  . Drug use: No  . Sexual activity: Yes    Partners: Male    Birth control/protection: Surgical  Other Topics Concern  . Not on file  Social History Narrative  . Not on file   Social Determinants of Health   Financial Resource Strain: Not on file  Food Insecurity: Not on file  Transportation Needs: Not on file  Physical Activity: Not on file  Stress: Not on file  Social Connections: Not on file  Intimate Partner Violence: Not on file    Past  Surgical History:  Procedure Laterality Date  . APPENDECTOMY     age [age]  . BREAST EXCISIONAL BIOPSY Left 08/2019  . BREAST LUMPECTOMY Left 2008   left breast  . CHOLECYSTECTOMY  2000  . ENDOMETRIAL ABLATION  2009  . LAPAROSCOPIC SALPINGO OOPHERECTOMY Left 1996   with right tubal ligation  . RADIOACTIVE SEED GUIDED EXCISIONAL BREAST BIOPSY Left 09/13/2019   Procedure: RADIOACTIVE SEED GUIDED EXCISIONAL LEFT BREAST BIOPSY;  Surgeon: Emelia Loron, MD;  Location: [facility];  Service: General;  Laterality: Left;  . TUBAL LIGATION      Family History   Problem Relation Age of Onset  . Diabetes Mother   . Colon cancer Father   . Breast cancer Maternal Aunt   . Ovarian cancer Maternal Aunt   . Ovarian cancer Cousin   . Colon cancer Cousin   . Melanoma Cousin   . Breast cancer Maternal Grandmother   . Cancer Other        breast and ovarian-maternal side    Allergies  Allergen Reactions  . Latex Rash    Causes blisters  . Lidocaine Nausea And Vomiting  . Peanut-Containing Drug Products Anaphylaxis  . Sulfa Antibiotics Shortness Of Breath and Rash  . Reglan [Metoclopramide] Tinitus  . Metformin And Related Diarrhea  . Adhesive [Tape] Rash  . Ciprofloxacin Rash  . Sulfur Rash    Current Outpatient Medications on File Prior to Visit  Medication Sig Dispense Refill  . EPINEPHrine 0.3 mg/0.3 mL IJ SOAJ injection Inject once into muscle for any allergic reaction to peanut exposure 2 Device 1  . ezetimibe (ZETIA) 10 MG tablet TAKE ONE TABLET BY MOUTH ONE TIME DAILY 30 tablet 5  . gabapentin (NEURONTIN) 300 MG capsule Take 1 capsule (300 mg total) by mouth at bedtime. 90 capsule 4  . Lancets (ONETOUCH ULTRASOFT) lancets Check blood sugar daily as directed. 100 each 12  . NONFORMULARY OR COMPOUNDED ITEM Vitamin E vaginal suppositories 200u/ml.  One pv three times weekly. 36 each 4  . omeprazole (PRILOSEC) 20 MG capsule TAKE ONE CAPSULE BY MOUTH ONE TIME DAILY 90 capsule 3  . ondansetron (ZOFRAN-ODT) 8 MG disintegrating tablet DISSOLVE ONE TABLET BY MOUTH EVERY 8 HOURS AS NEEDED FOR NAUSEA AND VOMITING 20 tablet 0  . ONETOUCH VERIO test strip USE TO TEST ONE TIME DAILY AS DIRECTED 100 strip 11  . pioglitazone (ACTOS) 15 MG tablet TAKE ONE TABLET BY MOUTH ONE TIME DAILY 30 tablet 0  . RYBELSUS 3 MG TABS Take 1 tablet by mouth daily.    . vitamin C (ASCORBIC ACID) 500 MG tablet Take 500 mg by mouth daily.    [name] exemestane (AROMASIN) 25 MG tablet Take 1 tablet (25 mg total) by mouth daily after breakfast. (Patient not taking: Reported on  07/24/2020) 30 tablet 6  . metoprolol tartrate (LOPRESSOR) 25 MG tablet Take 1 tablet (25 mg total) by mouth 2 (two) times daily. 180 tablet 3   No current facility-administered medications on file prior to visit.    BP 110/70   Pulse 98   Temp 99.6 F (37.6 C) (Oral)   Resp 18   Ht 5' 4.5" (1.638 m)   Wt 165 lb 12.8 oz (75.2 kg)   LMP 04/27/2017 Comment: spotting  SpO2 98%   BMI 28.02 kg/m       Objective:   Physical Exam  General Mental Status- Alert. General Appearance- Not in acute distress.   HEENT-frontal and maxillary sinus pressure to  palpation.  Both canals are clear normal TMs.  Skin General: Color- Normal Color. Moisture- Normal Moisture.  Neck Carotid Arteries- Normal color. Moisture- Normal Moisture. No carotid bruits. No JVD.  Chest and Lung Exam Auscultation: Breath Sounds:-Normal.  Cardiovascular Auscultation:Rythm- Regular. Murmurs & Other Heart Sounds:Auscultation of the heart reveals- No Murmurs.  Abdomen Inspection:-Inspeection Normal. Palpation/Percussion:Note:No mass. Palpation and Percussion of the abdomen reveal- Non Tender, Non Distended + BS, no rebound or guarding.   Neurologic Cranial Nerve exam:- CN III-XII intact(No nystagmus), symmetric smile. Strength:- 5/5 equal and symmetric strength both upper and lower extremities.      Assessment & Plan:  Probable sinusitis based on history and physical exam today.  Will prescribe Flonase for nasal congestion and a azithromycin antibiotic.  Your blood pressure is on the lower control side.  Most of your dizzy episodes occurred on changing positions such as when you do [place]'s exercising.  Would recommend that you check your blood pressure at home particularly when you feel dizzy and see  if your blood pressure is running lower than presently.  If so we might need to consider lowering dose of beta-blocker.  However that might increase your pulse which tends to run high.  Will watch closely.   Also consider not doing [place]/position change factor in her dizziness.  Make sure that you are well-hydrated.  Consider bottle  of [employer] water or sugar-free Gatorade to hydrate particular on days when you exercise.  Asked that you send me a MyChart update on your blood pressure readings in 1 week.  Follow-up with Dr. Wyvonnia Lora for for diabetes and A1c.  Follow-up in 6 months.  At that time we will need to get lipid panel and metabolic panel.  Depending on your blood pressure readings might have you follow-up sooner.  Mackie Pai, PA-C

## 2020-07-24 NOTE — Patient Instructions (Signed)
Probable sinusitis based on history and physical exam today.  Will prescribe Flonase for nasal congestion and a azithromycin antibiotic.  Your blood pressure is on the lower control side.  Most of your dizzy episodes occurred on changing positions such as when you do Bur[employer]'s exercising.  Would recommend that you check your blood pressure at home particularly when you feel dizzy and see  if your blood pressure is running lower than presently.  If so we might need to consider lowering dose of beta-blocker.  However that might increase your pulse which tends to run high.  Will watch closely.  Also consider not doing [place]/position change factor in her dizziness.  Make sure that you are well-hydrated.  Consider bottle  of propel fitness water or sugar-free Gatorade to hydrate particular on days when you exercise.  Asked that you send me a MyChart update on your blood pressure readings in 1 week.  Follow-up with Dr. Wonda Olds for for diabetes and A1c.  Follow-up in 6 months.  At that time we will need to get lipid panel and metabolic panel.  Depending on your blood pressure readings might have you follow-up sooner.

## 2020-07-30 ENCOUNTER — Other Ambulatory Visit: Payer: Self-pay

## 2020-07-31 ENCOUNTER — Encounter: Payer: Self-pay | Admitting: Internal Medicine

## 2020-07-31 ENCOUNTER — Ambulatory Visit: Payer: 59 | Admitting: Internal Medicine

## 2020-07-31 ENCOUNTER — Other Ambulatory Visit: Payer: Self-pay

## 2020-07-31 VITALS — BP 120/84 | HR 85 | Resp 16 | Ht 64.5 in | Wt 163.0 lb

## 2020-07-31 DIAGNOSIS — E785 Hyperlipidemia, unspecified: Secondary | ICD-10-CM

## 2020-07-31 DIAGNOSIS — E0800 Diabetes mellitus due to underlying condition with hyperosmolarity without nonketotic hyperglycemic-hyperosmolar coma (NKHHC): Secondary | ICD-10-CM | POA: Diagnosis not present

## 2020-07-31 DIAGNOSIS — Z794 Long term (current) use of insulin: Secondary | ICD-10-CM

## 2020-07-31 DIAGNOSIS — E119 Type 2 diabetes mellitus without complications: Secondary | ICD-10-CM

## 2020-07-31 HISTORY — DX: Hyperlipidemia, unspecified: E78.5

## 2020-07-31 HISTORY — DX: Type 2 diabetes mellitus without complications: E11.9

## 2020-07-31 LAB — POCT GLYCOSYLATED HEMOGLOBIN (HGB A1C): Hemoglobin A1C: 6.7 % — AB (ref 4.0–5.6)

## 2020-07-31 MED ORDER — ROSUVASTATIN CALCIUM 5 MG PO TABS: 5.0000 mg | ORAL_TABLET | Freq: Every day | ORAL | 3 refills | Status: DC

## 2020-07-31 MED ORDER — ROSUVASTATIN CALCIUM 5 MG PO TABS: 5.0000 mg | ORAL_TABLET | ORAL | 3 refills | Status: DC

## 2020-07-31 NOTE — Patient Instructions (Signed)
-   Keep up the Good Work !  - Continue Rybelsus 3 mg, 1 tablet before breakfast  - Continue Pioglitazone 15 mg, 1 tablet daily  - Start Rosuvastatin 5 mg , 1 tablet once weekly

## 2020-07-31 NOTE — Progress Notes (Signed)
Name: Carolyn Wilson  Age/ Sex: 50 y.o., female   MRN/ DOB: [id number], [birth year]     PCP: Elise Benne   Reason for Endocrinology Evaluation: Type 2 Diabetes Mellitus  Initial Endocrine Consultative Visit: 04/24/2020    PATIENT IDENTIFIER: [name] is a 50 y.o. female with a past medical history of Asthma, T2DM and Dyslipidemia. The patient has followed with Endocrinology clinic since 04/24/2020 for consultative assistance with management of her diabetes.  DIABETIC HISTORY:  [name] was diagnosed with DM in 2017,Metformin caused GI side effects, pioglitazone started in 02/2020. Her hemoglobin A1c has ranged from 6.2% in 2018, peaking at 7.4% in 2020.  On her initial visit to our clinic her A1c was 7.2% , she was on Onglyza and Pioglitazone which we stopped Onglyza and started Rybelsus.    SUBJECTIVE:   During the last visit (04/24/2020): A1c 7.2 % . We stopped onglyza, started Rybelsus and continued Pioglitazone      Today (07/31/2020): [name] is here for a follow up on diabetes management.  She checks her blood sugars 2 times daily.  The patient has not had hypoglycemic episodes since the last clinic visit.  Denies nausea or diarrhea  No joint pains.   HOME DIABETES REGIMEN:  Rybelsus 3 mg daily with breakfast  Pioglitazone 15 mg, 1 tablet with Breakfast     Statin: Pt on Zetia, can't walk on statins ACE-I/ARB: No Prior Diabetic Education: Yes   METER DOWNLOAD SUMMARY: Unable to download  98- 172 mg/dL      DIABETIC COMPLICATIONS: Microvascular complications:    Denies: CKD, retinopathy, neuropathy   Last Eye Exam: Completed 01/2020  Macrovascular complications:    Denies: CAD, CVA, PVD   HISTORY:  Past Medical History:  Past Medical History:  Diagnosis Date  . Abnormal Pap smear of cervix    h/o colposcopy/biopsy  . Allergy   . Asthma    HX  . Breast cancer ([place]) 2007  . Cancer ([place]) [id number]   ovarian   . Diabetes mellitus without complication ([place]) 50/0938    diagnosed by PCP, [name]   . GERD (gastroesophageal reflux disease)   . Hyperlipemia    on medication   . PONV (postoperative nausea and vomiting)    Past Surgical History:  Past Surgical History:  Procedure Laterality Date  . APPENDECTOMY     age 41-9  . BREAST EXCISIONAL BIOPSY Left 08/2019  . BREAST LUMPECTOMY Left 2008   left breast  . CHOLECYSTECTOMY  2000  . ENDOMETRIAL ABLATION  2009  . LAPAROSCOPIC SALPINGO OOPHERECTOMY Left 1996   with right tubal ligation  . RADIOACTIVE SEED GUIDED EXCISIONAL BREAST BIOPSY Left 09/13/2019   Procedure: RADIOACTIVE SEED GUIDED EXCISIONAL LEFT BREAST BIOPSY;  Surgeon: [name], MD;  Location: [facility];  Service: General;  Laterality: Left;  . TUBAL LIGATION      Social History:  reports that she has never smoked. She has never used smokeless tobacco. She reports current alcohol use of about 1.0 standard drink of alcohol per week. She reports that she does not use drugs. Family History:  Family History  Problem Relation Age of Onset  . Diabetes Mother   . Colon cancer Father   . Breast cancer Maternal Aunt   . Ovarian cancer Maternal Aunt   . Ovarian cancer Cousin   . Colon cancer Cousin   . Melanoma Cousin   . Breast cancer Maternal Grandmother   . Cancer Other  breast and ovarian-maternal side     HOME MEDICATIONS: Allergies as of 07/31/2020      Reactions   Latex Rash   Causes blisters   Lidocaine Nausea And Vomiting   Peanut-containing Drug Products Anaphylaxis   Sulfa Antibiotics Shortness Of Breath, Rash   Reglan [metoclopramide] Tinitus   Metformin And Related Diarrhea   Adhesive [tape] Rash   Ciprofloxacin Rash   Sulfur Rash      Medication List       Accurate as of July 31, 2020  8:07 AM. If you have any questions, ask your nurse or doctor.        azithromycin 250 MG tablet Commonly known as: ZITHROMAX Take 2  tablets by mouth on day 1, followed by 1 tablet by mouth daily for 4 days.   EPINEPHrine 0.3 mg/0.3 mL Soaj injection Commonly known as: EPI-PEN Inject once into muscle for any allergic reaction to peanut exposure   exemestane 25 MG tablet Commonly known as: AROMASIN Take 1 tablet (25 mg total) by mouth daily after breakfast.   ezetimibe 10 MG tablet Commonly known as: ZETIA TAKE ONE TABLET BY MOUTH ONE TIME DAILY   fluticasone 50 MCG/ACT nasal spray Commonly known as: FLONASE Place 2 sprays into both nostrils daily.   gabapentin 300 MG capsule Commonly known as: Neurontin Take 1 capsule (300 mg total) by mouth at bedtime.   metoprolol tartrate 25 MG tablet Commonly known as: LOPRESSOR Take 1 tablet (25 mg total) by mouth 2 (two) times daily.   NONFORMULARY OR COMPOUNDED ITEM Vitamin E vaginal suppositories 200u/ml.  One pv three times weekly.   omeprazole 20 MG capsule Commonly known as: PRILOSEC TAKE ONE CAPSULE BY MOUTH ONE TIME DAILY   ondansetron 8 MG disintegrating tablet Commonly known as: ZOFRAN-ODT DISSOLVE ONE TABLET BY MOUTH EVERY 8 HOURS AS NEEDED FOR NAUSEA AND VOMITING   onetouch ultrasoft lancets Check blood sugar daily as directed.   OneTouch Verio test strip Generic drug: glucose blood USE TO TEST ONE TIME DAILY AS DIRECTED   pioglitazone 15 MG tablet Commonly known as: ACTOS TAKE ONE TABLET BY MOUTH ONE TIME DAILY   Rybelsus 3 MG Tabs Generic drug: Semaglutide Take 1 tablet by mouth daily.   vitamin C 500 MG tablet Commonly known as: ASCORBIC ACID Take 500 mg by mouth daily.        OBJECTIVE:   Vital Signs: BP 120/84 (BP Location: Right Arm, Patient Position: Sitting, Cuff Size: Small)   Pulse 85   Resp 16   Ht 5' 4.5" (1.638 m)   Wt 163 lb (73.9 kg)   LMP 04/27/2017 Comment: spotting  SpO2 98%   BMI 27.55 kg/m   Wt Readings from Last 3 Encounters:  07/31/20 163 lb (73.9 kg)  07/24/20 165 lb 12.8 oz (75.2 kg)  06/27/20 168 lb  (76.2 kg)     Exam: General: Pt appears well and is in NAD  Lungs: Clear with good BS bilat with no rales, rhonchi, or wheezes  Heart: RRR with normal S1 and S2 and no gallops; no murmurs; no rub  Abdomen: Normoactive bowel sounds, soft, nontender, without masses or organomegaly palpable  Extremities: No pretibial edema.   Neuro: MS is good with appropriate affect, pt is alert and Ox3    DM foot exam: 04/24/2020  The skin of the feet is intact without sores or ulcerations. The pedal pulses are 2+ on right and 2+ on left. The sensation is intact to a screening 5.07,  10 gram monofilament bilaterally   DATA REVIEWED:  Lab Results  Component Value Date   HGBA1C 6.7 (A) 07/31/2020   HGBA1C 7.2 (H) 04/05/2020   HGBA1C 6.5 06/21/2019   Lab Results  Component Value Date   MICROALBUR 0.8 08/04/2016   LDLCALC 128 (H) 04/05/2020   CREATININE 1.11 (H) 06/27/2020   Lab Results  Component Value Date   MICRALBCREAT 0.4 08/04/2016     Lab Results  Component Value Date   CHOL 210 (H) 04/05/2020   HDL 38 (L) 04/05/2020   LDLCALC 128 (H) 04/05/2020   LDLDIRECT 115.0 03/09/2018   TRIG 289 (H) 04/05/2020   CHOLHDL 5.5 (H) 04/05/2020         ASSESSMENT / PLAN / RECOMMENDATIONS:   1) Type 2 Diabetes Mellitus, Optimally controlled, Without complications - Most recent A1c of 6.7%. Goal A1c < 7.0 %.     - Praised the pt on improved glycemic control and weight loss and encouraged her to continue with lifestyle changes  - She is concerned about fasting hyperglycemia , pt instructed to check BG at bedtime and again next morning  - No changes today     MEDICATIONS: Continue Rybelsus 3 mg daily with breakfast  Continue Pioglitazone 15 mg, 1 tablet with Breakfast    EDUCATION / INSTRUCTIONS:  BG monitoring instructions: Patient is instructed to check her blood sugars 2 times a day, bedtime and fasting .  Call [facility] if: BG persistently < 70      2)  Diabetic complications:   Eye: Does not have known diabetic retinopathy.   Neuro/ Feet: Does not have known diabetic peripheral neuropathy .   Renal: Patient does not  have known baseline CKD. She   is not  on an ACEI/ARB at present.       3) Dyslipidemia: LDL above goal of 128 mg/dL. Patient is intolerant to statins, has tried 3 different ones. We discussed the cardiovascular benefits of statins, We discussed trying a small dose of  Rosuvastatin 5 mg once week, which she is in agreements. We also discussed options such as PCSK-9 inhibitors.    Medications Start Rosuvastatin 5 mg, 1 tablet weekly  Zetia 10 mg daily      F/U in 4  Months    Signed electronically by: Mack Guise, MD  [facility]  [address] [place], [place] [address] Phone: [phone] FAX: [phone]   CC: Elise Benne [address] [place] [address] Phone: [phone]  Fax: [phone]  Return to Endocrinology clinic as below: Future Appointments  Date Time Provider [place]  08/01/2020 10:30 AM [facility] MR 1 [facility]315MRI [facility]  10/26/2020  7:45 AM [facility] LAB [facility] None  10/26/2020  8:00 AM [place], Rudell Cobb, MD [facility] None

## 2020-08-01 ENCOUNTER — Ambulatory Visit
Admission: RE | Admit: 2020-08-01 | Discharge: 2020-08-01 | Disposition: A | Discharge status: Home / Self Care | Payer: 59 | Source: Ambulatory Visit | Attending: Obstetrics & Gynecology | Admitting: Obstetrics & Gynecology

## 2020-08-01 DIAGNOSIS — N6489 Other specified disorders of breast: Secondary | ICD-10-CM

## 2020-08-01 DIAGNOSIS — Z803 Family history of malignant neoplasm of breast: Secondary | ICD-10-CM

## 2020-08-01 DIAGNOSIS — Z9189 Other specified personal risk factors, not elsewhere classified: Secondary | ICD-10-CM

## 2020-08-01 MED ORDER — GADOBUTROL 1 MMOL/ML IV SOLN
8.0000 mL | Freq: Once | INTRAVENOUS | Status: AC | PRN
  Administered 2020-08-01: 8 mL via INTRAVENOUS

## 2020-08-06 ENCOUNTER — Other Ambulatory Visit: Payer: Self-pay | Admitting: Medical

## 2020-08-14 ENCOUNTER — Ambulatory Visit: Payer: 59 | Admitting: Family Medicine

## 2020-08-14 ENCOUNTER — Other Ambulatory Visit: Payer: Self-pay

## 2020-08-14 ENCOUNTER — Ambulatory Visit: Payer: Self-pay

## 2020-08-14 VITALS — BP 104/70 | Ht 64.5 in | Wt 162.3 lb

## 2020-08-14 DIAGNOSIS — S63502A Unspecified sprain of left wrist, initial encounter: Secondary | ICD-10-CM | POA: Diagnosis not present

## 2020-08-14 DIAGNOSIS — M25532 Pain in left wrist: Secondary | ICD-10-CM

## 2020-08-14 DIAGNOSIS — S63502D Unspecified sprain of left wrist, subsequent encounter: Secondary | ICD-10-CM

## 2020-08-14 HISTORY — DX: Unspecified sprain of left wrist, subsequent encounter: S63.502D

## 2020-08-14 NOTE — Assessment & Plan Note (Signed)
Recent fall onto her outstretched hand.  No fracture appreciated.  Has changes over the TFCC.  Seems less likely for tear -Counseled on home exercise therapy and supportive care. -Wrist brace. -Provided samples of Pennsaid. -Could consider physical therapy or imaging.

## 2020-08-14 NOTE — Progress Notes (Unsigned)
Medication Samples have been provided to the patient.  Drug name: pennsaid       Strength: 2%        Qty: 2 boxes  LOT: [id number]  Exp.Date: 5/22  Dosing instructions: use a pea size amount and rub gently  The patient has been instructed regarding the correct time, dose, and frequency of taking this medication, including desired effects and most common side effects.   April Manson 11:21 AM 08/14/2020

## 2020-08-14 NOTE — Patient Instructions (Signed)
Nice to meet you Please try ice  Please try the rub on medicine  Please try the range of motion movements.  Please use the brace   Please send me a message in MyChart with any questions or updates.  Please see me back in 3 weeks.   --Dr. Raeford Razor

## 2020-08-14 NOTE — Progress Notes (Signed)
Carolyn Wilson - 50 y.o. female MRN [id number]  Date of birth: 04/19/1971  SUBJECTIVE:  Including CC & ROS.  No chief complaint on file.   Carolyn Wilson is a 50 y.o. female that is presenting with left wrist pain.  She had a fall a few days ago and landed on her wrist.  Having pain over the ulnar aspect of the wrist.  Has pain with any form or range of motion.  No previous history of similar pain and no history of surgery.   Review of Systems See HPI   HISTORY: Past Medical, Surgical, Social, and Family History Reviewed & Updated per EMR.   Pertinent Historical Findings include:  Past Medical History:  Diagnosis Date  . Abnormal Pap smear of cervix    h/o colposcopy/biopsy  . Allergy   . Asthma    HX  . Breast cancer ([facility]) 2007  . Cancer ([facility]) [id number]   ovarian  . Diabetes mellitus without complication ([facility]) 86/7619    diagnosed by PCP, [name]   . GERD (gastroesophageal reflux disease)   . Hyperlipemia    on medication   . PONV (postoperative nausea and vomiting)     Past Surgical History:  Procedure Laterality Date  . APPENDECTOMY     age [age]  . BREAST EXCISIONAL BIOPSY Left 08/2019  . BREAST LUMPECTOMY Left 2008   left breast  . CHOLECYSTECTOMY  2000  . ENDOMETRIAL ABLATION  2009  . LAPAROSCOPIC SALPINGO OOPHERECTOMY Left 1996   with right tubal ligation  . RADIOACTIVE SEED GUIDED EXCISIONAL BREAST BIOPSY Left 09/13/2019   Procedure: RADIOACTIVE SEED GUIDED EXCISIONAL LEFT BREAST BIOPSY;  Surgeon: [name], MD;  Location: [facility];  Service: General;  Laterality: Left;  . TUBAL LIGATION      Family History  Problem Relation Age of Onset  . Diabetes Mother   . Colon cancer Father   . Breast cancer Maternal Aunt   . Ovarian cancer Maternal Aunt   . Ovarian cancer Cousin   . Colon cancer Cousin   . Melanoma Cousin   . Breast cancer Maternal Grandmother   . Cancer Other        breast and ovarian-maternal side     Social History   Socioeconomic History  . Marital status: Married    Spouse name: Not on file  . Number of children: Not on file  . Years of education: Not on file  . Highest education level: Not on file  Occupational History  . Not on file  Tobacco Use  . Smoking status: Never Smoker  . Smokeless tobacco: Never Used  . Tobacco comment: NEVER USED TOBACCO  Vaping Use  . Vaping Use: Never used  Substance and Sexual Activity  . Alcohol use: Yes    Alcohol/week: 1.0 standard drink    Types: 1 Standard drinks or equivalent per week  . Drug use: No  . Sexual activity: Yes    Partners: Male    Birth control/protection: Surgical  Other Topics Concern  . Not on file  Social History Narrative   ** Merged History Encounter **       Social Determinants of Health   Financial Resource Strain: Not on file  Food Insecurity: Not on file  Transportation Needs: Not on file  Physical Activity: Not on file  Stress: Not on file  Social Connections: Not on file  Intimate Partner Violence: Not on file     PHYSICAL EXAM:  VS: BP 104/70  Ht 5' 4.5" (1.638 m)   Wt 162 lb 4.8 oz (73.6 kg)   LMP 04/27/2017 Comment: spotting  BMI 27.43 kg/m  Physical Exam Gen: NAD, alert, cooperative with exam, well-appearing MSK:  Left wrist: Some swelling over the TFCC. Limited flexion extension. Worse pain with ulnar deviation. Neurovascular intact  Limited ultrasound: Left wrist:  Effusion noted within the carpal joints. No changes of the distal radius or ulna. Increased hyperemia associated with the TFCC. Normal-appearing ECU insertion into the base of the fifth metacarpal  Summary: Findings would suggest effusion as well as irritation of the TFCC.  Ultrasound and interpretation by Clearance Coots, MD    ASSESSMENT & PLAN:   Wrist sprain, left, initial encounter Recent fall onto her outstretched hand.  No fracture appreciated.  Has changes over the TFCC.  Seems less likely for  tear -Counseled on home exercise therapy and supportive care. -Wrist brace. -Provided samples of Pennsaid. -Could consider physical therapy or imaging.

## 2020-08-23 ENCOUNTER — Encounter: Payer: Self-pay | Admitting: Medical

## 2020-09-05 ENCOUNTER — Other Ambulatory Visit: Payer: Self-pay

## 2020-09-05 ENCOUNTER — Ambulatory Visit: Payer: 59 | Admitting: Family Medicine

## 2020-09-05 DIAGNOSIS — S63502D Unspecified sprain of left wrist, subsequent encounter: Secondary | ICD-10-CM | POA: Diagnosis not present

## 2020-09-05 NOTE — Patient Instructions (Signed)
Good to see you Please continue the exercises  You can try weaning out of the brace over the next 3 weeks  Please send me a message in [place] with any questions or updates.  Please see me back in 4 weeks or as needed if better.   --Dr. Raeford Razor

## 2020-09-05 NOTE — Progress Notes (Signed)
Carolyn Wilson - 50 y.o. female MRN [id number]  Date of birth: [birth year]  SUBJECTIVE:  Including CC & ROS.  No chief complaint on file.   Carolyn Wilson is a 50 y.o. female that is following up for her left wrist pain.  She has been using the brace and that seems to be doing well.  Denies any significant pain other than when she loads the joint.   Review of Systems See HPI   HISTORY: Past Medical, Surgical, Social, and Family History Reviewed & Updated per EMR.   Pertinent Historical Findings include:  Past Medical History:  Diagnosis Date  . Abnormal Pap smear of cervix    h/o colposcopy/biopsy  . Allergy   . Asthma    HX  . Breast cancer ([facility]) 2007  . Cancer ([facility]) [id number]   ovarian  . Diabetes mellitus without complication ([facility]) 94/7096    diagnosed by PCP, [name]   . GERD (gastroesophageal reflux disease)   . Hyperlipemia    on medication   . PONV (postoperative nausea and vomiting)     Past Surgical History:  Procedure Laterality Date  . APPENDECTOMY     age 37-9  . BREAST EXCISIONAL BIOPSY Left 08/2019  . BREAST LUMPECTOMY Left 2008   left breast  . CHOLECYSTECTOMY  2000  . ENDOMETRIAL ABLATION  2009  . LAPAROSCOPIC SALPINGO OOPHERECTOMY Left 1996   with right tubal ligation  . RADIOACTIVE SEED GUIDED EXCISIONAL BREAST BIOPSY Left 09/13/2019   Procedure: RADIOACTIVE SEED GUIDED EXCISIONAL LEFT BREAST BIOPSY;  Surgeon: [name], MD;  Location: [facility];  Service: General;  Laterality: Left;  . TUBAL LIGATION      Family History  Problem Relation Age of Onset  . Diabetes Mother   . Colon cancer Father   . Breast cancer Maternal Aunt   . Ovarian cancer Maternal Aunt   . Ovarian cancer Cousin   . Colon cancer Cousin   . Melanoma Cousin   . Breast cancer Maternal Grandmother   . Cancer Other        breast and ovarian-maternal side    Social History   Socioeconomic History  . Marital status: Married     Spouse name: Not on file  . Number of children: Not on file  . Years of education: Not on file  . Highest education level: Not on file  Occupational History  . Not on file  Tobacco Use  . Smoking status: Never Smoker  . Smokeless tobacco: Never Used  . Tobacco comment: NEVER USED TOBACCO  Vaping Use  . Vaping Use: Never used  Substance and Sexual Activity  . Alcohol use: Yes    Alcohol/week: 1.0 standard drink    Types: 1 Standard drinks or equivalent per week  . Drug use: No  . Sexual activity: Yes    Partners: Male    Birth control/protection: Surgical  Other Topics Concern  . Not on file  Social History Narrative   ** Merged History Encounter **       Social Determinants of Health   Financial Resource Strain: Not on file  Food Insecurity: Not on file  Transportation Needs: Not on file  Physical Activity: Not on file  Stress: Not on file  Social Connections: Not on file  Intimate Partner Violence: Not on file     PHYSICAL EXAM:  VS: BP 102/78 (BP Location: Right Arm, Patient Position: Sitting, Cuff Size: Large)   Ht 5' 4.5" (1.638 m)  Wt 162 lb (73.5 kg)   LMP 04/27/2017 Comment: spotting  BMI 27.38 kg/m  Physical Exam Gen: NAD, alert, cooperative with exam, well-appearing MSK:  Left wrist: Normal range of motion. No swelling or ecchymosis. Neurovascularly intact     ASSESSMENT & PLAN:   Wrist sprain, left, subsequent encounter Doing well and has gained improvement in her range of motion and pain. -Counseled on home exercise therapy and supportive care. -Counseled on weaning out of brace. -Could consider physical therapy.

## 2020-09-05 NOTE — Assessment & Plan Note (Signed)
Doing well and has gained improvement in her range of motion and pain. -Counseled on home exercise therapy and supportive care. -Counseled on weaning out of brace. -Could consider physical therapy.

## 2020-09-08 ENCOUNTER — Encounter: Payer: Self-pay | Admitting: Internal Medicine

## 2020-09-10 MED ORDER — PIOGLITAZONE HCL 15 MG PO TABS: 15.0000 mg | ORAL_TABLET | Freq: Every day | ORAL | 2 refills | Status: DC

## 2020-10-17 ENCOUNTER — Other Ambulatory Visit: Payer: Self-pay | Admitting: Medical

## 2020-10-17 DIAGNOSIS — E1169 Type 2 diabetes mellitus with other specified complication: Secondary | ICD-10-CM

## 2020-10-17 DIAGNOSIS — E785 Hyperlipidemia, unspecified: Secondary | ICD-10-CM

## 2020-10-17 IMAGING — MR MR BREAST BILAT WO/W CM
8 of 12 series · 32 of 48 positions shown · IV contrast (gadavist)
Comparison: Previous exam(s).

CLINICAL DATA: 48-year-old female presenting for screening breast
MRI due to elevated lifetime risk of breast cancer. BRCA 1 gene
mutation. Two benign MRI guided right breast biopsies performed in
4117.

LABS:  None performed today and site.
EXAM:
BILATERAL BREAST MRI WITH AND WITHOUT CONTRAST
TECHNIQUE: Multiplanar, multisequence MR images of both breasts were obtained
prior to and following the intravenous administration of 7 ml of
Gadavist.

[Series 2: t2_tirm_tra ipat (a-p) · axial · 3.0mm · 0.70mm/px · 1 of 64 slices shown]
[im 1/64]
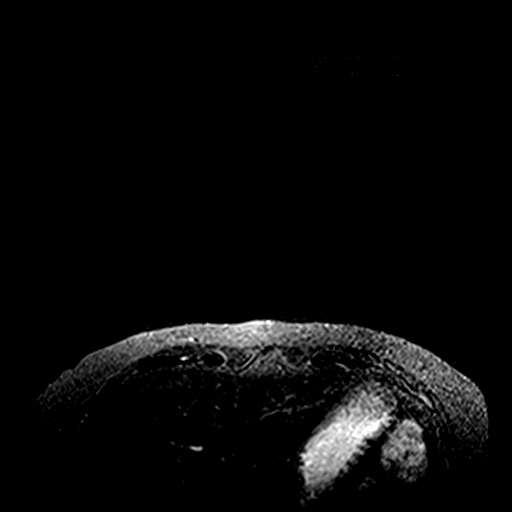

[Series 3: fl3d pre-cm no · axial · non-contrast · 1.2mm · 0.94mm/px · z∈[-112,+98]mm · 5 of 176 slices shown]
[im 1/176]
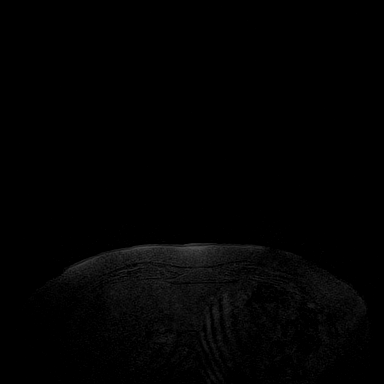
[im 44/176]
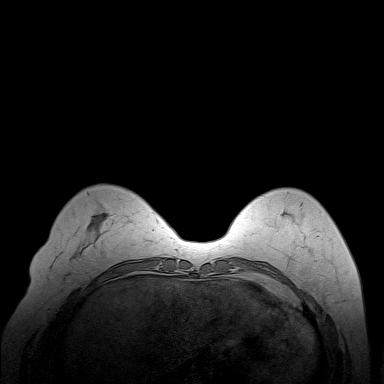
[im 88/176]
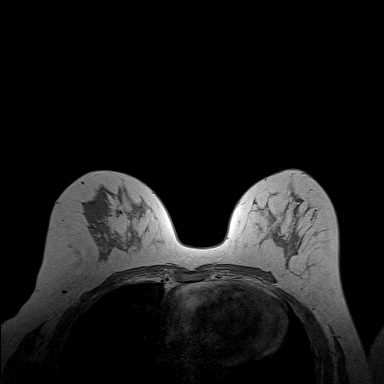
[im 132/176]
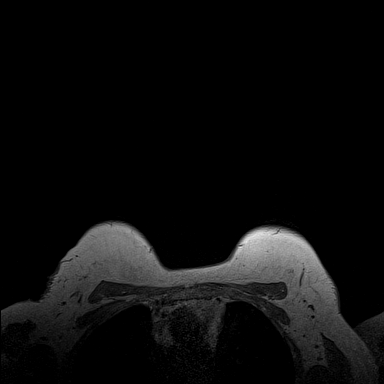
[im 176/176]
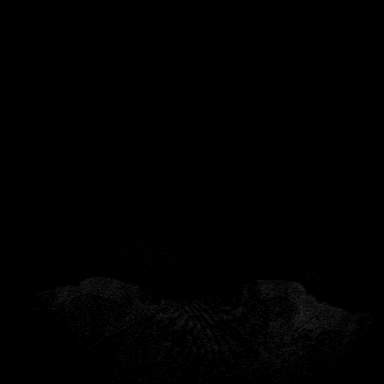

[Series 4: fl3d pre-cm · axial · non-contrast · 1.2mm · 0.94mm/px · z∈[-112,+98]mm · 5 of 176 slices shown]
[im 1/176]
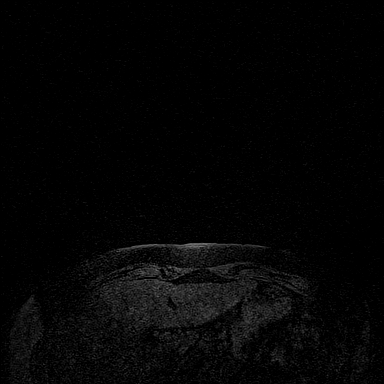
[im 44/176]
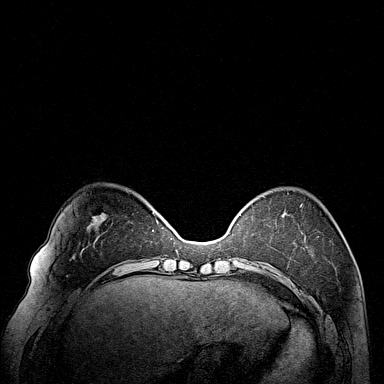
[im 88/176]
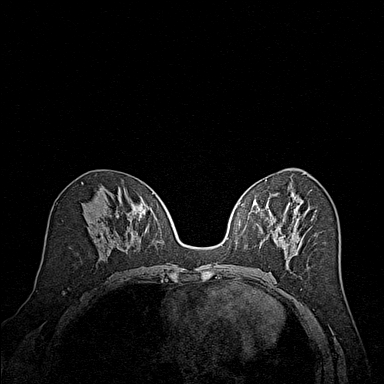
[im 132/176]
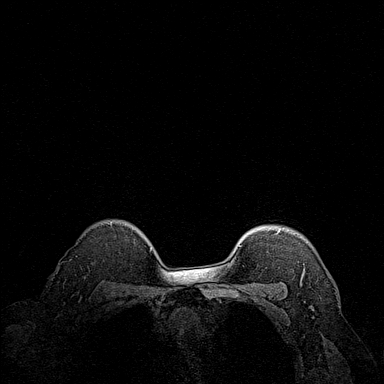
[im 176/176]
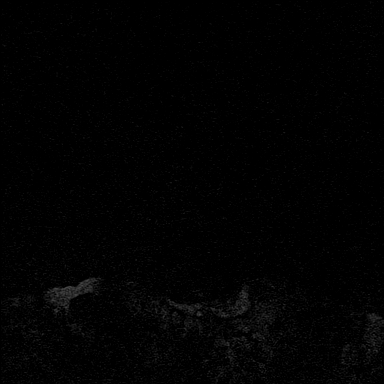

[Series 5: fl3d post-cm 20 · axial · 1.2mm · 0.94mm/px · z∈[-112,+98]mm · 5 of 176 slices shown (1 of 3)]
[im 1/176]
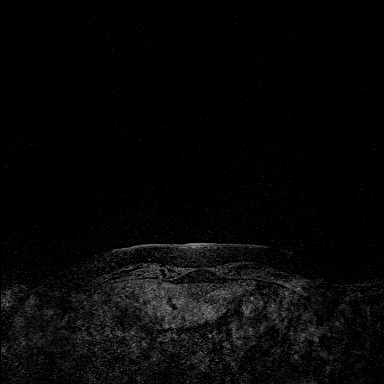
[im 44/176]
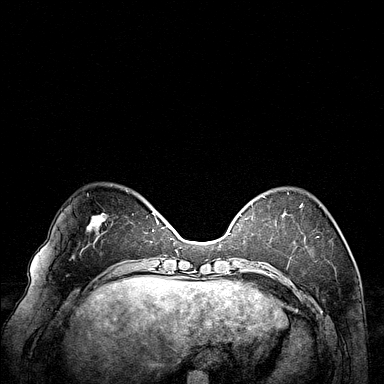
[im 88/176]
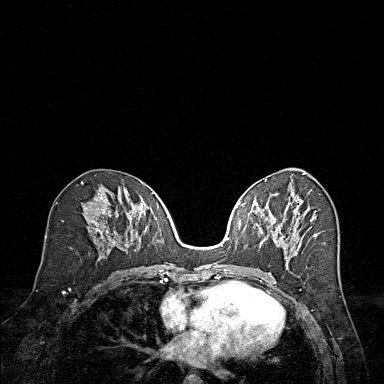
[im 132/176]
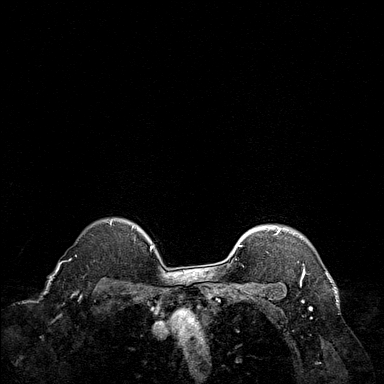
[im 176/176]
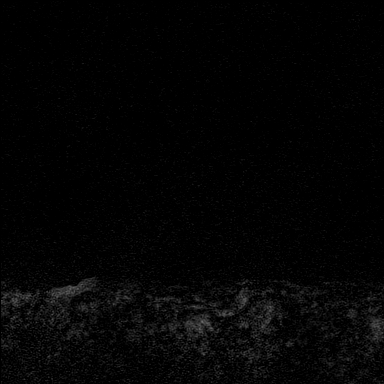

[Series 6: fl3d post-cm 20 · axial · 1.2mm · 0.94mm/px · z∈[-112,+98]mm · 5 of 176 slices shown (2 of 3)]
[im 1/176]
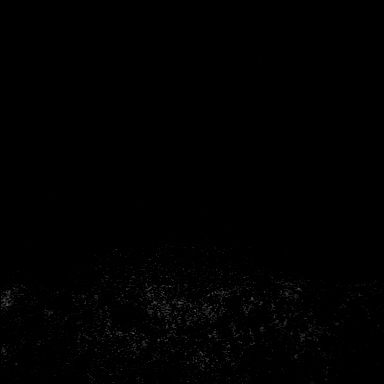
[im 44/176]
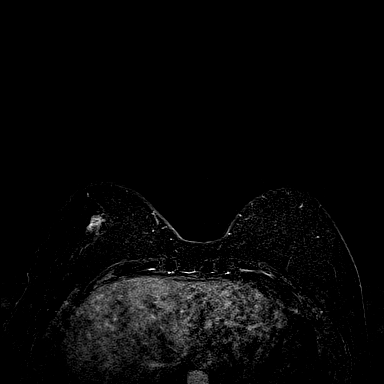
[im 88/176]
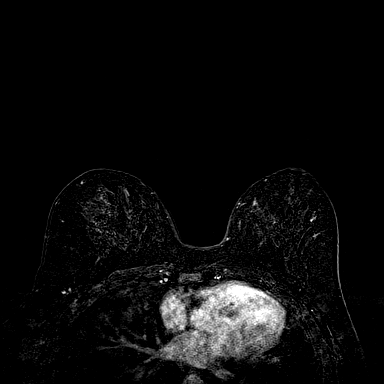
[im 132/176]
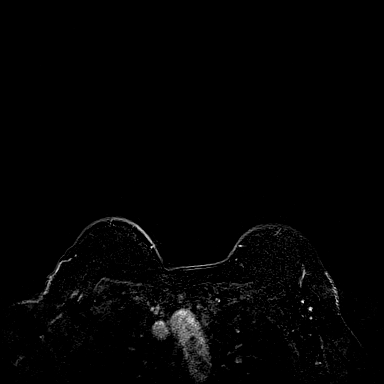
[im 176/176]
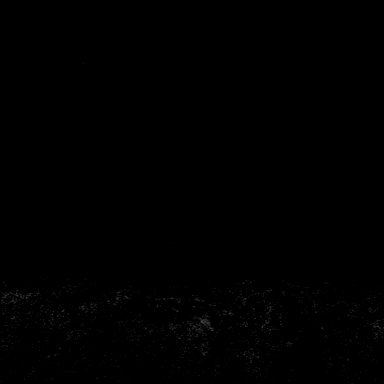

[Series 7: fl3d post-cm 20 · axial · 211.2mm · 0.94mm/px · 1 of 1 slices shown (3 of 3)]
[im 1/1]
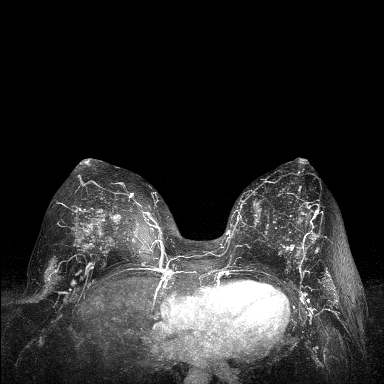

[Series 8: fl3d post-cm 3min · axial · 1.2mm · 0.94mm/px · z∈[-112,+98]mm · 6 of 176 slices shown]
[im 1/176]
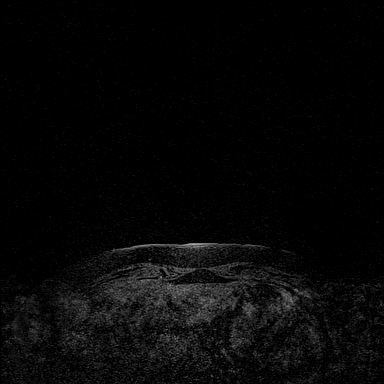
[im 36/176]
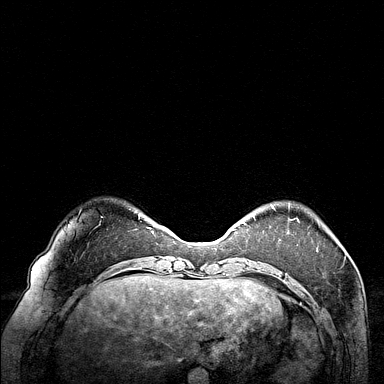
[im 71/176]
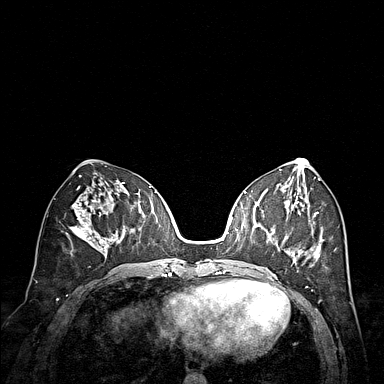
[im 106/176]
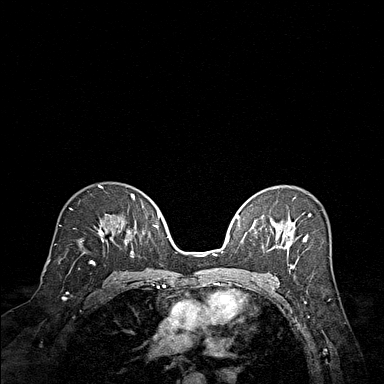
[im 141/176]
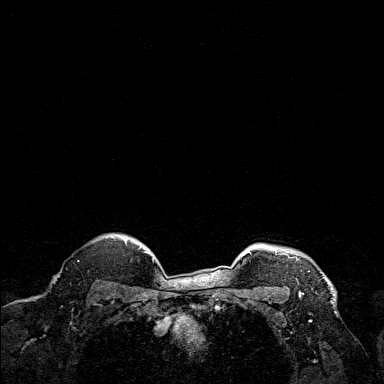
[im 176/176]
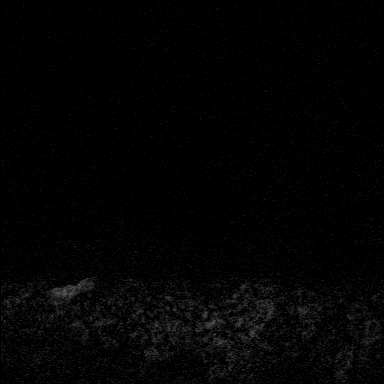

[Series 9: fl3d post-cm 3min_sub · axial · 1.2mm · 0.94mm/px · z∈[-112,+14]mm · 4 of 176 slices shown]
[im 1/176]
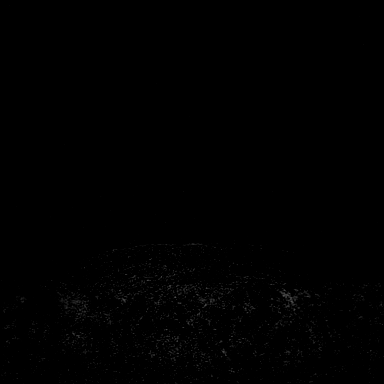
[im 36/176]
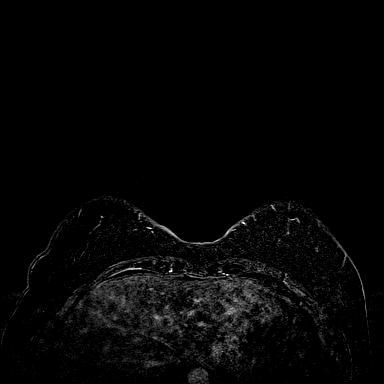
[im 71/176]
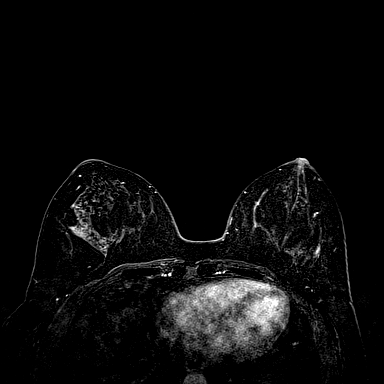
[im 106/176]
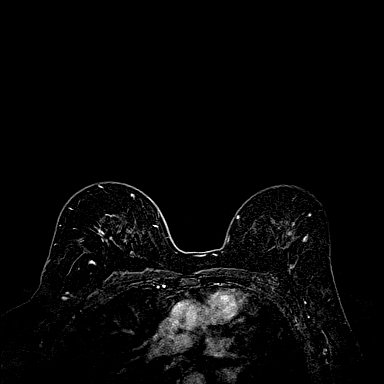

[32 of 48 positions shown; findings below may reference images not displayed]

Three-dimensional MR images were rendered by post-processing of the
original MR data on an independent workstation. The
three-dimensional MR images were interpreted, and findings are
reported in the following complete MRI report for this study. Three
dimensional images were evaluated at the independent [employer]
workstation
FINDINGS: Breast composition: c. Heterogeneous fibroglandular tissue.

Background parenchymal enhancement: Moderate. Patchy areas of
non-mass enhancement throughout the bilateral breast demonstrate
progressive enhancement kinetics and are similar in distribution
compared to prior study.

Right breast: Susceptibility artifact from post biopsy clips are
noted throughout the right breast. A 6 mm mass in the upper inner
aspect at posterior depth (series 6, image 85/176) is stable dating
back to 5653.

A new, irregular enhancing mass is demonstrated in the superior
central aspect at middle depth (series 6, image 86/176). It measures
8 x 7 x 4 mm. No other suspicious mass or abnormal enhancement.

Left breast: An irregular, enhancing mass is demonstrated in the
central aspect at posterior depth (series 6, image 110/176). It
measures 10 x 5 x 7 mm. No other suspicious mass or abnormal
enhancement.

Lymph nodes: No abnormal appearing lymph nodes.

Ancillary findings:  None.
IMPRESSION: 1. Indeterminate 8 mm enhancing mass in the superior central right
breast (series 6, image 86/176). Recommendation is for MRI guided
biopsy.
2. Indeterminate 10 mm enhancing mass in the central left breast
(series 6, image 110/176). Recommendation is for MRI guided biopsy.
3. No suspicious lymphadenopathy.

RECOMMENDATION:
Two area MRI guided biopsy in the bilateral breasts.

BI-RADS CATEGORY  4: Suspicious.

## 2020-10-24 LAB — HM COLONOSCOPY

## 2020-10-26 ENCOUNTER — Other Ambulatory Visit: Payer: Self-pay

## 2020-10-26 ENCOUNTER — Inpatient Hospital Stay: Payer: 59 | Attending: Hematology & Oncology

## 2020-10-26 ENCOUNTER — Telehealth: Payer: Self-pay

## 2020-10-26 ENCOUNTER — Inpatient Hospital Stay (HOSPITAL_BASED_OUTPATIENT_CLINIC_OR_DEPARTMENT_OTHER): Payer: 59 | Admitting: Hematology & Oncology

## 2020-10-26 ENCOUNTER — Encounter: Payer: Self-pay | Admitting: Hematology & Oncology

## 2020-10-26 VITALS — BP 106/78 | HR 79 | Temp 98.5°F | Resp 17 | Wt 165.0 lb

## 2020-10-26 DIAGNOSIS — Z9889 Other specified postprocedural states: Secondary | ICD-10-CM

## 2020-10-26 DIAGNOSIS — Z8543 Personal history of malignant neoplasm of ovary: Secondary | ICD-10-CM | POA: Diagnosis present

## 2020-10-26 DIAGNOSIS — Z803 Family history of malignant neoplasm of breast: Secondary | ICD-10-CM

## 2020-10-26 DIAGNOSIS — Z8639 Personal history of other endocrine, nutritional and metabolic disease: Secondary | ICD-10-CM | POA: Diagnosis not present

## 2020-10-26 DIAGNOSIS — E119 Type 2 diabetes mellitus without complications: Secondary | ICD-10-CM | POA: Insufficient documentation

## 2020-10-26 DIAGNOSIS — N6092 Unspecified benign mammary dysplasia of left breast: Secondary | ICD-10-CM | POA: Insufficient documentation

## 2020-10-26 DIAGNOSIS — N6091 Unspecified benign mammary dysplasia of right breast: Secondary | ICD-10-CM | POA: Diagnosis not present

## 2020-10-26 LAB — CMP (CANCER CENTER ONLY)
ALT: 27 U/L (ref 0–44)
AST: 24 U/L (ref 15–41)
Albumin: 4.3 g/dL (ref 3.5–5.0)
Alkaline Phosphatase: 90 U/L (ref 38–126)
Anion gap: 9 (ref 5–15)
BUN: 12 mg/dL (ref 6–20)
CO2: 32 mmol/L (ref 22–32)
Calcium: 10.3 mg/dL (ref 8.9–10.3)
Chloride: 101 mmol/L (ref 98–111)
Creatinine: 1.01 mg/dL — ABNORMAL HIGH (ref 0.44–1.00)
GFR, Estimated: >60 mL/min (ref >60)
Glucose, Bld: 160 mg/dL — ABNORMAL HIGH (ref 70–99)
Potassium: 4.2 mmol/L (ref 3.5–5.1)
Sodium: 142 mmol/L (ref 135–145)
Total Bilirubin: 0.4 mg/dL (ref 0.3–1.2)
Total Protein: 7.2 g/dL (ref 6.5–8.1)

## 2020-10-26 LAB — CBC WITH DIFFERENTIAL (CANCER CENTER ONLY)
Abs Immature Granulocytes: 0.02 10*3/uL (ref 0.00–0.07)
Basophils Absolute: 0 10*3/uL (ref 0.0–0.1)
Basophils Relative: 0 %
Eosinophils Absolute: 0.2 10*3/uL (ref 0.0–0.5)
Eosinophils Relative: 2 %
HCT: 40.6 % (ref 36.0–46.0)
Hemoglobin: 13.5 g/dL (ref 12.0–15.0)
Immature Granulocytes: 0 %
Lymphocytes Relative: 35 %
Lymphs Abs: 2.5 10*3/uL (ref 0.7–4.0)
MCH: 30.1 pg (ref 26.0–34.0)
MCHC: 33.3 g/dL (ref 30.0–36.0)
MCV: 90.6 fL (ref 80.0–100.0)
Monocytes Absolute: 0.5 10*3/uL (ref 0.1–1.0)
Monocytes Relative: 7 %
Neutro Abs: 3.9 10*3/uL (ref 1.7–7.7)
Neutrophils Relative %: 56 %
Platelet Count: 310 10*3/uL (ref 150–400)
RBC: 4.48 MIL/uL (ref 3.87–5.11)
RDW: 12.7 % (ref 11.5–15.5)
WBC Count: 7.2 10*3/uL (ref 4.0–10.5)
nRBC: 0 % (ref 0.0–0.2)

## 2020-10-26 LAB — VITAMIN D 25 HYDROXY (VIT D DEFICIENCY, FRACTURES): Vit D, 25-Hydroxy: 39.83 ng/mL (ref 30–100)

## 2020-10-26 NOTE — Progress Notes (Signed)
Hematology and Oncology Follow Up Visit  Carolyn Wilson [id number] 09/27/1970 50 y.o. 10/26/2020   Principle Diagnosis:  1. History of Sertoli-Leydig tumor of the left ovary  2. Strong family history of breast and ovarian cancer - BRCA 1/2 and p53 negative 3. 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 Complex Sclerosing Lesion with usual ductal hyperplasia of the LEFT breast (central)  Current Therapy:         Femara 2.5 po  q day - start on 10/31/2020   Interim History:  Carolyn Wilson is here today for follow-up.  She and her family had a wonderful time down at [employer].  We talked a lot about this.  She really enjoyed herself.  She is working quite hard.  She is working long hours.  She got a promotion.  She was taken off Aromasin when I saw her last.  She started to feel little bit better.  We will go ahead and try some different for her to try to help prevent recurrence of this pre-malignant lesion of the breast.  I will try her on Femara.  I think she should do okay with Femara.  She is working hard on her diabetes.  She has had no problems with the diabetes.  Her blood sugars have not been too high or too low.  She has had no problems with Covid.  There is been no change in bowel or bladder habits.    There has been no diarrhea.  She has had no issues with her bladder.  There is been no dysuria.  She has had no leg swelling.  Overall, her performance status right now is ECOG 1.   Medications:  Allergies as of 10/26/2020      Reactions   Latex Rash   Causes blisters   Lidocaine Nausea And Vomiting   Peanut-containing Drug Products Anaphylaxis   Sulfa Antibiotics Shortness Of Breath, Rash   Reglan [metoclopramide] Tinitus   Metformin And Related Diarrhea   Adhesive [tape] Rash   Ciprofloxacin Rash   Elemental Sulfur Rash      Medication List       Accurate as of October 26, 2020  8:02 AM. If you have any  questions, ask your nurse or doctor.        STOP taking these medications   azithromycin 250 MG tablet Commonly known as: ZITHROMAX Stopped by: Volanda Napoleon, MD   NONFORMULARY OR COMPOUNDED ITEM Stopped by: Volanda Napoleon, MD     TAKE these medications   EPINEPHrine 0.3 mg/0.3 mL Soaj injection Commonly known as: EPI-PEN Inject once into muscle for any allergic reaction to peanut exposure   exemestane 25 MG tablet Commonly known as: AROMASIN Take 1 tablet (25 mg total) by mouth daily after breakfast.   ezetimibe 10 MG tablet Commonly known as: ZETIA TAKE ONE TABLET BY MOUTH ONE TIME DAILY   fluticasone 50 MCG/ACT nasal spray Commonly known as: FLONASE Place 2 sprays into both nostrils daily.   gabapentin 300 MG capsule Commonly known as: Neurontin Take 1 capsule (300 mg total) by mouth at bedtime.   metoprolol tartrate 25 MG tablet Commonly known as: LOPRESSOR Take 1 tablet (25 mg total) by mouth 2 (two) times daily.   omeprazole 20 MG capsule Commonly known as: PRILOSEC TAKE ONE CAPSULE BY MOUTH ONE TIME DAILY   ondansetron 8 MG disintegrating tablet Commonly known as: ZOFRAN-ODT DISSOLVE ONE TABLET BY MOUTH EVERY 8 HOURS AS NEEDED FOR  NAUSEA AND VOMITING   onetouch ultrasoft lancets Check blood sugar daily as directed.   OneTouch Verio test strip Generic drug: glucose blood USE TO TEST ONE TIME DAILY AS DIRECTED   pioglitazone 15 MG tablet Commonly known as: ACTOS Take 1 tablet (15 mg total) by mouth daily.   rosuvastatin 5 MG tablet Commonly known as: Crestor Take 1 tablet (5 mg total) by mouth as directed. ONce weekly   Rybelsus 3 MG Tabs Generic drug: Semaglutide Take 1 tablet by mouth daily.   vitamin C 500 MG tablet Commonly known as: ASCORBIC ACID Take 500 mg by mouth daily.       Allergies:  Allergies  Allergen Reactions  . Latex Rash    Causes blisters  . Lidocaine Nausea And Vomiting  . Peanut-Containing Drug Products  Anaphylaxis  . Sulfa Antibiotics Shortness Of Breath and Rash  . Reglan [Metoclopramide] Tinitus  . Metformin And Related Diarrhea  . Adhesive [Tape] Rash  . Ciprofloxacin Rash  . Elemental Sulfur Rash    Past Medical History, Surgical history, Social history, and Family History were reviewed and updated.  Review of Systems: Review of Systems  Constitutional: Negative.   HENT: Negative.   Eyes: Negative.   Respiratory: Negative.   Cardiovascular: Negative.   Gastrointestinal: Negative.   Genitourinary: Negative.   Musculoskeletal: Negative.   Skin: Negative.   Neurological: Negative.   Endo/Heme/Allergies: Negative.   Psychiatric/Behavioral: Negative.    [name]   Physical Exam:  weight is 165 lb (74.8 kg). Her oral temperature is 98.5 F (36.9 C). Her blood pressure is 106/78 and her pulse is 79. Her respiration is 17 and oxygen saturation is 100%.   Wt Readings from Last 3 Encounters:  10/26/20 165 lb (74.8 kg)  09/05/20 162 lb (73.5 kg)  08/14/20 162 lb 4.8 oz (73.6 kg)    Physical Exam Vitals reviewed.  Constitutional:      Comments: Her breast exam shows right breast with no masses, edema or erythema. She has the biopsy site at about the 10 o'clock position on the right breast. There is no erythema or ecchymosis associated with this. There is no right axillary adenopathy.  Left breast shows the lumpectomy scar at the edge of the areola at 3:00.  There is no erythema or warmth.  There is no discharge noted.  There is no tenderness to palpation.  There is no left axillary adenopathy.  HENT:     Head: Normocephalic and atraumatic.  Eyes:     Pupils: Pupils are equal, round, and reactive to light.  Cardiovascular:     Rate and Rhythm: Normal rate and regular rhythm.     Heart sounds: Normal heart sounds.  Pulmonary:     Effort: Pulmonary effort is normal.     Breath sounds: Normal breath sounds.  Abdominal:     General: Bowel sounds are normal.     Palpations: Abdomen  is soft.  Musculoskeletal:        General: No tenderness or deformity. Normal range of motion.     Cervical back: Normal range of motion.  Lymphadenopathy:     Cervical: No cervical adenopathy.  Skin:    General: Skin is warm and dry.     Findings: No erythema or rash.  Neurological:     Mental Status: She is alert and oriented to person, place, and time.  Psychiatric:        Behavior: Behavior normal.        Thought Content: Thought  content normal.        Judgment: Judgment normal.      Lab Results  Component Value Date   WBC 7.2 10/26/2020   HGB 13.5 10/26/2020   HCT 40.6 10/26/2020   MCV 90.6 10/26/2020   PLT 310 10/26/2020   No results found for: FERRITIN, IRON, TIBC, UIBC, IRONPCTSAT Lab Results  Component Value Date   RBC 4.48 10/26/2020   No results found for: KPAFRELGTCHN, LAMBDASER, KAPLAMBRATIO No results found for: IGGSERUM, IGA, IGMSERUM No results found for: Odetta Pink, SPEI   Chemistry      Component Value Date/Time   NA 140 06/27/2020 0759   NA 142 11/01/2019 0902   NA 144 03/11/2017 0822   K 4.0 06/27/2020 0759   K 4.5 03/11/2017 0822   CL 102 06/27/2020 0759   CL 104 03/11/2017 0822   CO2 30 06/27/2020 0759   CO2 29 03/11/2017 0822   BUN 12 06/27/2020 0759   BUN 14 11/01/2019 0902   BUN 9 03/11/2017 0822   CREATININE 1.11 (H) 06/27/2020 0759   CREATININE 0.82 04/05/2020 0732      Component Value Date/Time   CALCIUM 9.9 06/27/2020 0759   CALCIUM 9.2 03/11/2017 0822   ALKPHOS 80 06/27/2020 0759   ALKPHOS 65 03/11/2017 0822   AST 24 06/27/2020 0759   ALT 36 06/27/2020 0759   ALT 25 03/11/2017 0822   BILITOT 0.5 06/27/2020 0759       Impression and Plan: Ms. Koehl is a 50 yo post-menopausal female with a 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   She was diagnosed with stromal hyperplasia of the right breast and ductal  hyperplasia of the left breast in December 2020.    She had a left breast lumpectomy with radioactive seed placement in February 2021.   Now, she has a similar situation in the right breast.  I will go ahead and try her on Femara.  Hopefully she will do well with Femara.  I think we can get her back in 4 months.  I think this would be reasonable.  I am glad that she had a wonderful time down in [employer].  I heard all about it.  I am very happy for her.   Volanda Napoleon, MD 4/8/20228:02 AM

## 2020-10-26 NOTE — Telephone Encounter (Signed)
appts  Made per 10/26/20 los and pt req to view on my chart  A nne

## 2020-10-27 ENCOUNTER — Encounter: Payer: Self-pay | Admitting: Hematology & Oncology

## 2020-10-27 LAB — LUTEINIZING HORMONE: LH: 55.5 m[IU]/mL

## 2020-10-27 LAB — FOLLICLE STIMULATING HORMONE: FSH: 82.3 m[IU]/mL

## 2020-10-29 ENCOUNTER — Other Ambulatory Visit: Payer: Self-pay | Admitting: Hematology & Oncology

## 2020-10-29 MED ORDER — LETROZOLE 2.5 MG PO TABS: 2.5000 mg | ORAL_TABLET | Freq: Every day | ORAL | 6 refills | Status: DC

## 2020-10-30 ENCOUNTER — Encounter: Payer: Self-pay | Admitting: Internal Medicine

## 2020-10-30 DIAGNOSIS — E1169 Type 2 diabetes mellitus with other specified complication: Secondary | ICD-10-CM

## 2020-10-31 MED ORDER — RYBELSUS 3 MG PO TABS: 1.0000 | ORAL_TABLET | Freq: Every day | ORAL | 2 refills | Status: DC

## 2020-10-31 MED ORDER — EZETIMIBE 10 MG PO TABS: 10.0000 mg | ORAL_TABLET | Freq: Every day | ORAL | 2 refills | Status: DC

## 2020-11-06 LAB — ESTRADIOL, ULTRA SENS: Estradiol, Sensitive: 3.4 pg/mL

## 2020-11-27 ENCOUNTER — Other Ambulatory Visit: Payer: Self-pay

## 2020-11-27 ENCOUNTER — Ambulatory Visit: Payer: 59 | Admitting: Medical

## 2020-11-27 VITALS — BP 115/76 | HR 94 | Temp 98.1°F | Resp 20 | Ht 63.0 in | Wt 162.8 lb

## 2020-11-27 DIAGNOSIS — M898X6 Other specified disorders of bone, lower leg: Secondary | ICD-10-CM | POA: Diagnosis not present

## 2020-11-27 DIAGNOSIS — E785 Hyperlipidemia, unspecified: Secondary | ICD-10-CM

## 2020-11-27 NOTE — Patient Instructions (Addendum)
For pretibial slight bulge and mild tibia pain will refer to sports medicine. Sports med might do [place] over area then determine if xray needed.   If area worsens or changes prior to referral let us know.  Hyperlipidemia. Currently on on crestor 5 mg weekly. Tolerating so far.  Follow up as needed.(october for wellness exam)

## 2020-11-27 NOTE — Progress Notes (Signed)
Subjective:    Patient ID: Carolyn Wilson, female    DOB: [birth year], 50 y.o.   MRN: [id number]  HPI  Pt in with small area mid portion of tibia. Slight bulge present. Pt thinks getting larger. No obvious tibia/shin trauma.   No calf swelling or calf pain.  Sometime pain feel deep below bulge.   Review of Systems  Constitutional: Negative for chills, fatigue and fever.  Respiratory: Negative for cough, chest tightness and wheezing.   Cardiovascular: Negative for chest pain and palpitations.  Gastrointestinal: Negative for abdominal pain.  Genitourinary: Negative for dysuria and enuresis.  Musculoskeletal:       Rt lower ext bulge with mild mild over tibia.  Neurological: Negative for dizziness, speech difficulty, weakness, numbness and headaches.  Hematological: Negative for adenopathy.  Psychiatric/Behavioral: Negative for self-injury and suicidal ideas. The patient is not nervous/anxious.     Past Medical History:  Diagnosis Date  . Abnormal Pap smear of cervix    h/o colposcopy/biopsy  . Allergy   . Asthma    HX  . Breast cancer ([place]) 2007  . Cancer ([place]) [id number]   ovarian  . Diabetes mellitus without complication ([place]) 64/4034    diagnosed by PCP, [name]   . GERD (gastroesophageal reflux disease)   . Hyperlipemia    on medication   . PONV (postoperative nausea and vomiting)        Past Surgical History:  Procedure Laterality Date  . APPENDECTOMY     age [age]  . BREAST EXCISIONAL BIOPSY Left 08/2019  . BREAST LUMPECTOMY Left 2008   left breast  . CHOLECYSTECTOMY  2000  . ENDOMETRIAL ABLATION  2009  . LAPAROSCOPIC SALPINGO OOPHERECTOMY Left 1996   with right tubal ligation  . RADIOACTIVE SEED GUIDED EXCISIONAL BREAST BIOPSY Left 09/13/2019   Procedure: RADIOACTIVE SEED GUIDED EXCISIONAL LEFT BREAST BIOPSY;  Surgeon: [name], MD;  Location: [facility];  Service: General;  Laterality: Left;  . TUBAL LIGATION       Family History  Problem Relation Age of Onset  . Diabetes Mother   . Colon cancer Father   . Breast cancer Maternal Aunt   . Ovarian cancer Maternal Aunt   . Ovarian cancer Cousin   . Colon cancer Cousin   . Melanoma Cousin   . Breast cancer Maternal Grandmother   . Cancer Other        breast and ovarian-maternal side    Allergies  Allergen Reactions  . Latex Rash    Causes blisters  . Lidocaine Nausea And Vomiting  . Peanut-Containing Drug Products Anaphylaxis  . Sulfa Antibiotics Shortness Of Breath and Rash  . Reglan [Metoclopramide] Tinitus  . Metformin And Related Diarrhea  . Adhesive [Tape] Rash  . Ciprofloxacin Rash  . Elemental Sulfur Rash    Current Outpatient Medications on File Prior to Visit  Medication Sig Dispense Refill  . ezetimibe (ZETIA) 10 MG tablet Take 1 tablet (10 mg total) by mouth daily. 30 tablet 2  . gabapentin (NEURONTIN) 300 MG capsule Take 1 capsule (300 mg total) by mouth at bedtime. 90 capsule 4  . Lancets (ONETOUCH ULTRASOFT) lancets Check blood sugar daily as directed. 100 each 12  . letrozole (FEMARA) 2.5 MG tablet Take 1 tablet (2.5 mg total) by mouth daily. 30 tablet 6  . omeprazole (PRILOSEC) 20 MG capsule TAKE ONE CAPSULE BY MOUTH ONE TIME DAILY 90 capsule 3  . ondansetron (ZOFRAN-ODT) 8 MG disintegrating tablet DISSOLVE ONE TABLET  BY MOUTH EVERY 8 HOURS AS NEEDED FOR NAUSEA AND VOMITING 20 tablet 0  . ONETOUCH VERIO test strip USE TO TEST ONE TIME DAILY AS DIRECTED 100 strip 11  . pioglitazone (ACTOS) 15 MG tablet Take 1 tablet (15 mg total) by mouth daily. 90 tablet 2  . rosuvastatin (CRESTOR) 5 MG tablet Take 1 tablet (5 mg total) by mouth as directed. ONce weekly 30 tablet 3  . RYBELSUS 3 MG TABS Take 1 tablet by mouth daily. 30 tablet 2  . vitamin C (ASCORBIC ACID) 500 MG tablet Take 500 mg by mouth daily.    . fluticasone (FLONASE) 50 MCG/ACT nasal spray Place 2 sprays into both nostrils daily. 16 g 1  . metoprolol tartrate  (LOPRESSOR) 25 MG tablet Take 1 tablet (25 mg total) by mouth 2 (two) times daily. 180 tablet 3   No current facility-administered medications on file prior to visit.    BP 115/76   Pulse 94   Temp 98.1 F (36.7 C)   Resp 20   Ht 5\' 3"  (1.6 m)   Wt 162 lb 12.8 oz (73.8 kg)   LMP 04/27/2017 Comment: spotting  SpO2 99%   BMI 28.84 kg/m       Objective:   Physical Exam  General- no acute distress. Rt lower ext- mid portion of anterior tibial area small palpable bulge about 8-10 mm wide. No swelling of calf. Negative homans signs      Assessment & Plan:  For pretibial slight bulge and mild tibia pain will refer to sports medicine. Sports med might do [place] over area then determine if xray needed.   If area worsens or changes prior to referral let us know.  Hyperlipidemia. Currently on on crestor 5 mg weekly. Tolerating so far.  Follow up as needed.  Mackie Pai, PA-C

## 2020-11-29 ENCOUNTER — Encounter: Payer: Self-pay | Admitting: Family Medicine

## 2020-11-29 ENCOUNTER — Ambulatory Visit (HOSPITAL_BASED_OUTPATIENT_CLINIC_OR_DEPARTMENT_OTHER)
Admission: RE | Admit: 2020-11-29 | Discharge: 2020-11-29 | Disposition: A | Discharge status: Home / Self Care | Payer: 59 | Source: Ambulatory Visit | Attending: Family Medicine | Admitting: Family Medicine

## 2020-11-29 ENCOUNTER — Ambulatory Visit: Payer: Self-pay

## 2020-11-29 ENCOUNTER — Other Ambulatory Visit: Payer: Self-pay

## 2020-11-29 ENCOUNTER — Ambulatory Visit (INDEPENDENT_AMBULATORY_CARE_PROVIDER_SITE_OTHER): Payer: 59 | Admitting: Family Medicine

## 2020-11-29 VITALS — BP 110/80 | Ht 63.0 in | Wt 162.0 lb

## 2020-11-29 DIAGNOSIS — M6289 Other specified disorders of muscle: Secondary | ICD-10-CM | POA: Insufficient documentation

## 2020-11-29 DIAGNOSIS — M8560 Other cyst of bone, unspecified site: Secondary | ICD-10-CM | POA: Diagnosis present

## 2020-11-29 DIAGNOSIS — M898X6 Other specified disorders of bone, lower leg: Secondary | ICD-10-CM

## 2020-11-29 HISTORY — DX: Other specified disorders of muscle: M62.89

## 2020-11-29 NOTE — Progress Notes (Signed)
Carolyn Wilson - 50 y.o. female MRN [id number]  Date of birth: [birth year]  SUBJECTIVE:  Including CC & ROS.  No chief complaint on file.   Carolyn Wilson is a 50 y.o. female that is presenting with a right tibial mass.  This has been ongoing for about 5 weeks.  Denies any pain.  Seems to be getting bigger over the past few weeks.   Review of Systems See HPI   HISTORY: Past Medical, Surgical, Social, and Family History Reviewed & Updated per EMR.   Pertinent Historical Findings include:  Past Medical History:  Diagnosis Date  . Abnormal Pap smear of cervix    h/o colposcopy/biopsy  . Allergy   . Asthma    HX  . Breast cancer ([facility]) 2007  . Cancer ([place]) [id number]   ovarian  . Diabetes mellitus without complication ([facility]) 48/5462    diagnosed by PCP, [name]   . GERD (gastroesophageal reflux disease)   . Hyperlipemia    on medication   . PONV (postoperative nausea and vomiting)     Past Surgical History:  Procedure Laterality Date  . APPENDECTOMY     age [age]  . BREAST EXCISIONAL BIOPSY Left 08/2019  . BREAST LUMPECTOMY Left 2008   left breast  . CHOLECYSTECTOMY  2000  . ENDOMETRIAL ABLATION  2009  . LAPAROSCOPIC SALPINGO OOPHERECTOMY Left 1996   with right tubal ligation  . RADIOACTIVE SEED GUIDED EXCISIONAL BREAST BIOPSY Left 09/13/2019   Procedure: RADIOACTIVE SEED GUIDED EXCISIONAL LEFT BREAST BIOPSY;  Surgeon: [name], MD;  Location: [facility];  Service: General;  Laterality: Left;  . TUBAL LIGATION      Family History  Problem Relation Age of Onset  . Diabetes Mother   . Colon cancer Father   . Breast cancer Maternal Aunt   . Ovarian cancer Maternal Aunt   . Ovarian cancer Cousin   . Colon cancer Cousin   . Melanoma Cousin   . Breast cancer Maternal Grandmother   . Cancer Other        breast and ovarian-maternal side    Social History   Socioeconomic History  . Marital status: Married    Spouse name: Not on  file  . Number of children: Not on file  . Years of education: Not on file  . Highest education level: Not on file  Occupational History  . Not on file  Tobacco Use  . Smoking status: Never Smoker  . Smokeless tobacco: Never Used  . Tobacco comment: NEVER USED TOBACCO  Vaping Use  . Vaping Use: Never used  Substance and Sexual Activity  . Alcohol use: Yes    Alcohol/week: 1.0 standard drink    Types: 1 Standard drinks or equivalent per week  . Drug use: No  . Sexual activity: Yes    Partners: Male    Birth control/protection: Surgical  Other Topics Concern  . Not on file  Social History Narrative   ** Merged History Encounter **       Social Determinants of Health   Financial Resource Strain: Not on file  Food Insecurity: Not on file  Transportation Needs: Not on file  Physical Activity: Not on file  Stress: Not on file  Social Connections: Not on file  Intimate Partner Violence: Not on file     PHYSICAL EXAM:  VS: BP 110/80 (BP Location: Left Arm, Patient Position: Sitting, Cuff Size: Large)   Ht 5\' 3"  (1.6 m)   Wt 162  lb (73.5 kg)   LMP 04/27/2017 Comment: spotting  BMI 28.70 kg/m  Physical Exam Gen: NAD, alert, cooperative with exam, well-appearing MSK:  Right tibia: Obvious mass at the mid tibial shaft. No overlying swelling or ecchymosis. No tenderness to palpation. Neurovascularly intact  Limited ultrasound: Right tibia:  There is a well-defined cystic structure from Eugenia's architecture with no hyperemia in the mid tibial shaft.  Measures roughly 0.5 cm x 0.6 cm in diameter. Does not appear to be invaginating within the tibial shaft itself  Summary: Cystic lesion within the mid tibial shaft  Ultrasound and interpretation by Clearance Coots, MD    ASSESSMENT & PLAN:   Bone cyst Change appreciated on physical exam as well as ultrasound.  Has been enlarging and occurring over the past few weeks.  Does have a history of cancer and concern given  recent enlargement. -Counseled on home exercise therapy and supportive care. - X-ray. -MRI to evaluate for possible cancer origin.

## 2020-11-29 NOTE — Patient Instructions (Signed)
Good to see you I will call with the results from today xray  I'll let you know when we talk where I'm sending you for the MRI  Please send me a message in [place] with any questions or updates.  We will set up a virtual visit once the MRI is resulted.   --Dr. Raeford Razor

## 2020-11-29 NOTE — Assessment & Plan Note (Signed)
Change appreciated on physical exam as well as ultrasound.  Has been enlarging and occurring over the past few weeks.  Does have a history of cancer and concern given recent enlargement. -Counseled on home exercise therapy and supportive care. - X-ray. -MRI to evaluate for possible cancer origin.

## 2020-12-03 ENCOUNTER — Telehealth: Payer: Self-pay | Admitting: Family Medicine

## 2020-12-03 DIAGNOSIS — M8560 Other cyst of bone, unspecified site: Secondary | ICD-10-CM

## 2020-12-03 NOTE — Telephone Encounter (Signed)
Informed of results.  We will pursue MRI of the tibia to evaluate cystic change of the tibia.  Rosemarie Ax, MD [facility] 12/03/2020, 9:48 AM

## 2020-12-04 ENCOUNTER — Ambulatory Visit: Payer: 59 | Admitting: Internal Medicine

## 2020-12-04 ENCOUNTER — Other Ambulatory Visit: Payer: Self-pay

## 2020-12-04 ENCOUNTER — Encounter: Payer: Self-pay | Admitting: Internal Medicine

## 2020-12-04 VITALS — BP 110/76 | HR 78 | Ht 63.0 in

## 2020-12-04 DIAGNOSIS — E785 Hyperlipidemia, unspecified: Secondary | ICD-10-CM

## 2020-12-04 DIAGNOSIS — E119 Type 2 diabetes mellitus without complications: Secondary | ICD-10-CM | POA: Diagnosis not present

## 2020-12-04 LAB — MICROALBUMIN / CREATININE URINE RATIO
Creatinine,U: 157.2 mg/dL
Microalb Creat Ratio: 0.5 mg/g (ref 0.0–30.0)
Microalb, Ur: 0.7 mg/dL (ref 0.0–1.9)

## 2020-12-04 LAB — POCT GLYCOSYLATED HEMOGLOBIN (HGB A1C): Hemoglobin A1C: 7.3 % — AB (ref 4.0–5.6)

## 2020-12-04 MED ORDER — RYBELSUS 7 MG PO TABS: 7.0000 mg | ORAL_TABLET | Freq: Every day | ORAL | 2 refills | Status: DC

## 2020-12-04 NOTE — Progress Notes (Signed)
Name: Carolyn Wilson  Age/ Sex: 50 y.o., female   MRN/ DOB: [id number], October 22, 1970     PCP: Elise Benne   Reason for Endocrinology Evaluation: Type 2 Diabetes Mellitus  Initial Endocrine Consultative Visit: 04/24/2020    PATIENT IDENTIFIER: Carolyn Wilson is a 50 y.o. female with a past medical history of Asthma, Dyslipidemia and T2dm. The patient has followed with Endocrinology clinic since 04/24/2020 for consultative assistance with management of her diabetes.  DIABETIC HISTORY:  Carolyn Wilson was diagnosed with  DM in 2017,Metformin caused GI side effects, pioglitazone started in 02/2020. Her hemoglobin A1c has ranged from 6.2%in 2018, peaking at7.4%in 2020.  On her initial visit to our clinic her A1c was 7.2% , she was on Onglyza and Pioglitazone which we stopped Onglyza and started Rybelsus.    SUBJECTIVE:   During the last visit (07/31/2020): A1c 6.7 % . Continued Rybelsus and pioglitazone     Today (12/04/2020): Carolyn Wilson is here for a follow up on diabetes.  She checks her blood sugars 1 times daily. The patient has not had hypoglycemic episodes since the last clinic visit.  Denies nausea or diarrhea    HOME DIABETES REGIMEN:  Rybelsus 3 mg daily with breakfast  Pioglitazone 15 mg, 1 tablet with Breakfast     Statin: yes ACE-I/ARB: no   METER DOWNLOAD SUMMARY: Date range evaluated: 5/3-5/17/2022  Average Number Tests/Day = 0.8 Overall Mean FS Glucose = 129 Standard Deviation = 19  BG Ranges: Low = 112 High = 180   Hypoglycemic Events/30 Days: BG < 50 = 0 Episodes of symptomatic severe hypoglycemia =0    DIABETIC COMPLICATIONS: Microvascular complications:    Denies: CKD, retinopathy, neuropathy  Last Eye Exam: Completed 01/2020  Macrovascular complications:    Denies: CAD, CVA, PVD   HISTORY:  Past Medical History:  Past Medical History:  Diagnosis Date  . Abnormal Pap smear of cervix    h/o  colposcopy/biopsy  . Allergy   . Asthma    HX  . Breast cancer ([place]) 2007  . Cancer ([place]) [id number]   ovarian  . Diabetes mellitus without complication ([place]) 19/6222    diagnosed by PCP, [name]   . GERD (gastroesophageal reflux disease)   . Hyperlipemia    on medication   . PONV (postoperative nausea and vomiting)    Past Surgical History:  Past Surgical History:  Procedure Laterality Date  . APPENDECTOMY     age [age]  . BREAST EXCISIONAL BIOPSY Left 08/2019  . BREAST LUMPECTOMY Left 2008   left breast  . CHOLECYSTECTOMY  2000  . ENDOMETRIAL ABLATION  2009  . LAPAROSCOPIC SALPINGO OOPHERECTOMY Left 1996   with right tubal ligation  . RADIOACTIVE SEED GUIDED EXCISIONAL BREAST BIOPSY Left 09/13/2019   Procedure: RADIOACTIVE SEED GUIDED EXCISIONAL LEFT BREAST BIOPSY;  Surgeon: [name], MD;  Location: [facility];  Service: General;  Laterality: Left;  . TUBAL LIGATION      Social History:  reports that she has never smoked. She has never used smokeless tobacco. She reports current alcohol use of about 1.0 standard drink of alcohol per week. She reports that she does not use drugs. Family History:  Family History  Problem Relation Age of Onset  . Diabetes Mother   . Colon cancer Father   . Breast cancer Maternal Aunt   . Ovarian cancer Maternal Aunt   . Ovarian cancer Cousin   . Colon cancer Cousin   . Melanoma Cousin   .  Breast cancer Maternal Grandmother   . Cancer Other        breast and ovarian-maternal side     HOME MEDICATIONS: Allergies as of 12/04/2020      Reactions   Latex Rash   Causes blisters   Lidocaine Nausea And Vomiting   Peanut-containing Drug Products Anaphylaxis   Sulfa Antibiotics Shortness Of Breath, Rash   Reglan [metoclopramide] Tinitus   Metformin And Related Diarrhea   Adhesive [tape] Rash   Ciprofloxacin Rash   Elemental Sulfur Rash      Medication List       Accurate as of Dec 04, 2020  7:53 AM. If you  have any questions, ask your nurse or doctor.        ezetimibe 10 MG tablet Commonly known as: ZETIA Take 1 tablet (10 mg total) by mouth daily.   fluticasone 50 MCG/ACT nasal spray Commonly known as: FLONASE Place 2 sprays into both nostrils daily.   gabapentin 300 MG capsule Commonly known as: Neurontin Take 1 capsule (300 mg total) by mouth at bedtime.   letrozole 2.5 MG tablet Commonly known as: FEMARA Take 1 tablet (2.5 mg total) by mouth daily.   metoprolol tartrate 25 MG tablet Commonly known as: LOPRESSOR Take 1 tablet (25 mg total) by mouth 2 (two) times daily.   omeprazole 20 MG capsule Commonly known as: PRILOSEC TAKE ONE CAPSULE BY MOUTH ONE TIME DAILY   ondansetron 8 MG disintegrating tablet Commonly known as: ZOFRAN-ODT DISSOLVE ONE TABLET BY MOUTH EVERY 8 HOURS AS NEEDED FOR NAUSEA AND VOMITING   onetouch ultrasoft lancets Check blood sugar daily as directed.   OneTouch Verio test strip Generic drug: glucose blood USE TO TEST ONE TIME DAILY AS DIRECTED   pioglitazone 15 MG tablet Commonly known as: ACTOS Take 1 tablet (15 mg total) by mouth daily.   rosuvastatin 5 MG tablet Commonly known as: Crestor Take 1 tablet (5 mg total) by mouth as directed. ONce weekly   Rybelsus 7 MG Tabs Generic drug: Semaglutide Take 7 mg by mouth daily. What changed:   medication strength  how much to take Changed by: Dorita Sciara, MD   vitamin C 500 MG tablet Commonly known as: ASCORBIC ACID Take 500 mg by mouth daily.        OBJECTIVE:   Vital Signs: BP 110/76   Pulse 78   LMP 04/27/2017 Comment: spotting  SpO2 98%   Wt Readings from Last 3 Encounters:  11/29/20 162 lb (73.5 kg)  11/27/20 162 lb 12.8 oz (73.8 kg)  10/26/20 165 lb (74.8 kg)     Exam: General: Carolyn Wilson appears well and is in NAD  Lungs: Clear with good BS bilat with no rales, rhonchi, or wheezes  Heart: RRR with normal S1 and S2 and no gallops; no murmurs; no rub  Abdomen:  Normoactive bowel sounds, soft, nontender, without masses or organomegaly palpable  Extremities: No pretibial edema. Left 3rd toe chronic rash   Neuro: MS is good with appropriate affect, Carolyn Wilson is alert and Ox3     DM foot exam:04/24/2020  The skin of the feet is intact without sores or ulcerations. The pedal pulses are 2+ on right and 2+ on left. The sensation is intact to a screening 5.07, 10 gram monofilament bilaterally   DATA REVIEWED:  Lab Results  Component Value Date   HGBA1C 7.3 (A) 12/04/2020   HGBA1C 6.7 (A) 07/31/2020   HGBA1C 7.2 (H) 04/05/2020   Lab Results  Component Value  Date   MICROALBUR 0.8 08/04/2016   LDLCALC 128 (H) 04/05/2020   CREATININE 1.01 (H) 10/26/2020    Lab Results  Component Value Date   CHOL 210 (H) 04/05/2020   HDL 38 (L) 04/05/2020   LDLCALC 128 (H) 04/05/2020   LDLDIRECT 115.0 03/09/2018   TRIG 289 (H) 04/05/2020   CHOLHDL 5.5 (H) 04/05/2020       Results for Carolyn Wilson, Carolyn DAWN "[name]" (MRN [id number]) as of 12/04/2020 12:39  Ref. Range 12/04/2020 07:57  Creatinine,U Latest Units: mg/dL 157.2  Microalb, Ur Latest Ref Range: 0.0 - 1.9 mg/dL 0.7  MICROALB/CREAT RATIO Latest Ref Range: 0.0 - 30.0 mg/g 0.5    ASSESSMENT / PLAN / RECOMMENDATIONS:   1) Type 2 Diabetes Mellitus, Sub- Optimally controlled, Without complications - Most recent A1c of 7.3%. Goal A1c < 7.0 %.    - Slight increase in A1c - In review of her meter download, her pre-supper Bg's are optimal , most likely explanation is postprandial hyperglycemia which is not captured on meter, Carolyn Wilson to check fasting or bedtime glucose  - Will increase Rybelsus as below  - Intolerant to Metformin    MEDICATIONS: - Increase Rybelsus 7 mg, 1 tablet before breakfast  - Continue Pioglitazone 15 mg, 1 tablet daily    EDUCATION / INSTRUCTIONS:  BG monitoring instructions: Patient is instructed to check her blood sugars 1 times a day, fasting or bedtime   Call [facility]  cl[facility] if: BG persistently < 70  . I reviewed the Rule of 15 for the treatment of hypoglycemia in detail with the patient. Literature supplied.    2) Diabetic complications:   Eye: Does not have known diabetic retinopathy.   Neuro/ Feet: Does not have known diabetic peripheral neuropathy .   Renal: Patient does not have known baseline CKD. She   is not  on an ACEI/ARB at present.   3)Dyslipidemia:LDL above goal of 128 mg/dL.Patient isintolerant to statins, has tried 3 different ones. We discussed trying a small dose of  Rosuvastatin 5 mg once week which was started 07/2020  She is tolerating this well    Medications Continue Rosuvastatin 5 mg, 1 tablet weekly  Continue Zetia 10 mg daily    F/U in 4 months    Signed electronically by: Mack Guise, MD  [facility]  [facility] [address] [place], [place] [address] Phone: [phone] FAX: [phone]   CC: Elise Benne [address] [place] [address] Phone: [phone]  Fax: [phone]  Return to Endocrinology clinic as below: Future Appointments  Date Time Provider [place]  03/01/2021  7:45 AM [facility] None  03/01/2021  8:15 AM Marin Olp, Rudell Cobb, MD [facility] None  05/28/2021  8:15 AM Megan Salon, MD [facility]

## 2020-12-04 NOTE — Patient Instructions (Addendum)
-   Increase Rybelsus 7 mg, 1 tablet before breakfast  - Continue Pioglitazone 15 mg, 1 tablet daily      HOW TO TREAT LOW BLOOD SUGARS (Blood sugar LESS THAN 70 MG/DL)  Please follow the RULE OF 15 for the treatment of hypoglycemia treatment (when your (blood sugars are less than 70 mg/dL)    STEP 1: Take 15 grams of carbohydrates when your blood sugar is low, which includes:   3-4 GLUCOSE TABS  OR  3-4 OZ OF JUICE OR REGULAR SODA OR  ONE TUBE OF GLUCOSE GEL     STEP 2: RECHECK blood sugar in 15 MINUTES STEP 3: If your blood sugar is still low at the 15 minute recheck --> then, go back to STEP 1 and treat AGAIN with another 15 grams of carbohydrates.

## 2020-12-05 ENCOUNTER — Ambulatory Visit
Admission: RE | Admit: 2020-12-05 | Discharge: 2020-12-05 | Disposition: A | Discharge status: Home / Self Care | Payer: 59 | Source: Ambulatory Visit | Attending: Family Medicine | Admitting: Family Medicine

## 2020-12-05 DIAGNOSIS — M8560 Other cyst of bone, unspecified site: Secondary | ICD-10-CM

## 2020-12-05 MED ORDER — GADOBENATE DIMEGLUMINE 529 MG/ML IV SOLN
15.0000 mL | Freq: Once | INTRAVENOUS | Status: AC | PRN
  Administered 2020-12-05: 15 mL via INTRAVENOUS

## 2020-12-06 ENCOUNTER — Telehealth (INDEPENDENT_AMBULATORY_CARE_PROVIDER_SITE_OTHER): Payer: 59 | Admitting: Family Medicine

## 2020-12-06 ENCOUNTER — Other Ambulatory Visit: Payer: Self-pay

## 2020-12-06 DIAGNOSIS — M6289 Other specified disorders of muscle: Secondary | ICD-10-CM | POA: Diagnosis not present

## 2020-12-06 NOTE — Assessment & Plan Note (Signed)
Appears that there is a fatty lobule that could be herniating through the fascia.   -Counseled on home exercise therapy and supportive care. -Counseled on compression.

## 2020-12-06 NOTE — Progress Notes (Signed)
Virtual Visit via Video Note  I connected with Carolyn Wilson on 12/06/20 at  1:20 PM EDT by a video enabled telemedicine application and verified that I am speaking with the correct person using two identifiers.  Location: Patient: work Provider: office   I discussed the limitations of evaluation and management by telemedicine and the availability of in person appointments. The patient expressed understanding and agreed to proceed.  History of Present Illness:  [name] is a 50 yo F that is following up after the MRI of her right tibia.  Revealing for a small lobular benign-appearing fat tissue along the anterior mid lower leg.   Observations/Objective:  Gen: NAD, alert, cooperative with exam, well-appearing   Assessment and Plan:  Fascial herniation of fat lobule: Appears that there is a fatty lobule that could be herniating through the fascia.   -Counseled on home exercise therapy and supportive care. -Counseled on compression.  Follow Up Instructions:    I discussed the assessment and treatment plan with the patient. The patient was provided an opportunity to ask questions and all were answered. The patient agreed with the plan and demonstrated an understanding of the instructions.   The patient was advised to call back or seek an in-person evaluation if the symptoms worsen or if the condition fails to improve as anticipated.    Clearance Coots, MD

## 2020-12-07 ENCOUNTER — Other Ambulatory Visit: Payer: Self-pay | Admitting: Obstetrics & Gynecology

## 2020-12-07 DIAGNOSIS — Z1231 Encounter for screening mammogram for malignant neoplasm of breast: Secondary | ICD-10-CM

## 2020-12-11 ENCOUNTER — Other Ambulatory Visit: Payer: Self-pay | Admitting: Medical

## 2020-12-12 ENCOUNTER — Other Ambulatory Visit: Payer: Self-pay | Admitting: Cardiology

## 2020-12-26 ENCOUNTER — Other Ambulatory Visit: Payer: Self-pay

## 2020-12-27 MED ORDER — METOPROLOL TARTRATE 25 MG PO TABS: 25.0000 mg | ORAL_TABLET | Freq: Two times a day (BID) | ORAL | 0 refills | Status: DC

## 2020-12-31 ENCOUNTER — Encounter: Payer: Self-pay | Admitting: Medical

## 2021-01-02 IMAGING — DX MM BREAST SURGICAL SPECIMEN
1 series · 2 of 2 positions shown · non-contrast
Comparison: Previous exam(s).

CLINICAL DATA: Specimen mammogram from surgical excision left
breast lesion after radioactive seed localization.

EXAM:
SPECIMEN RADIOGRAPH OF THE LEFT BREAST

[Series 1: specimen digital x-ray · left · 0.10mm/px · 2 of 2 slices shown]
[im 1/2]
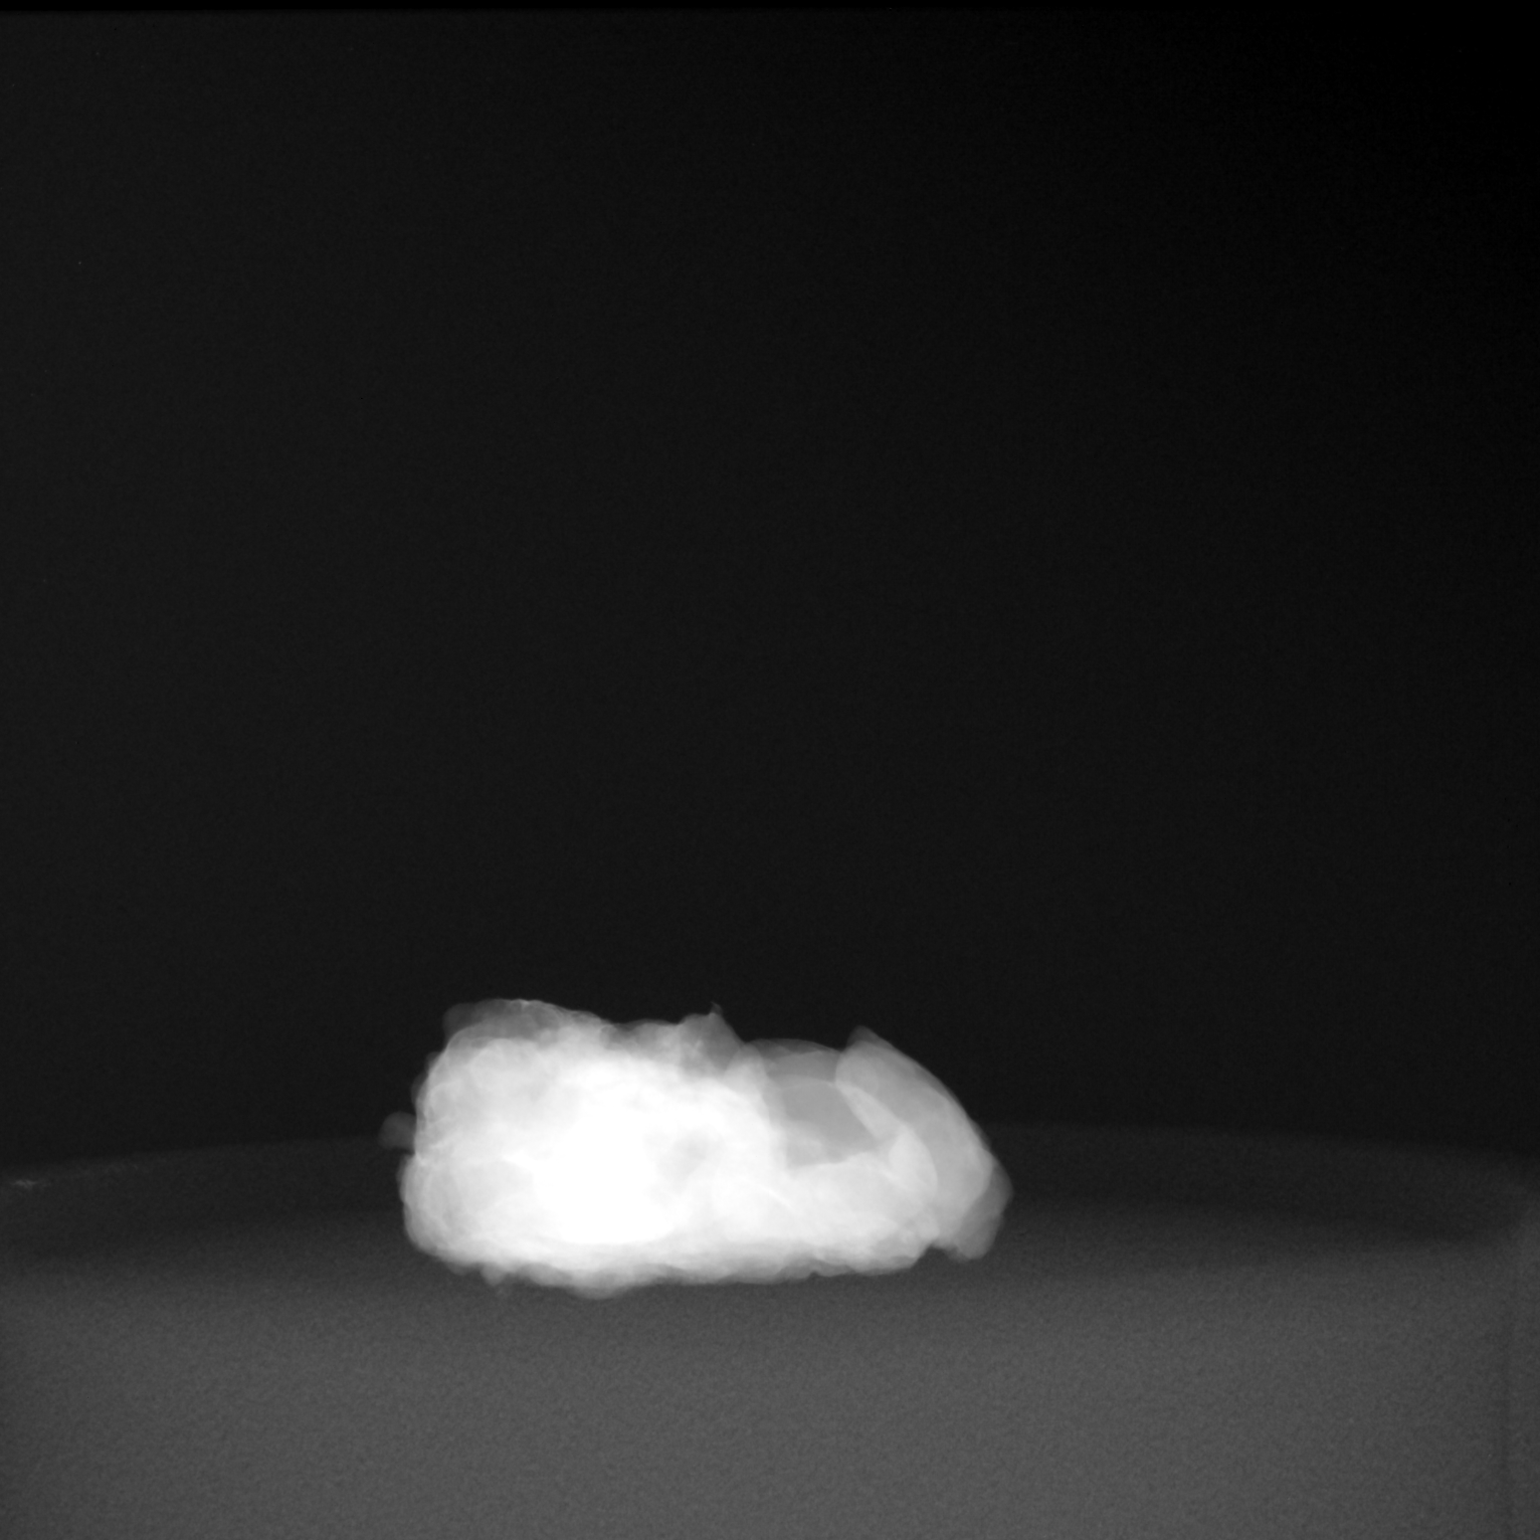
[im 2/2]
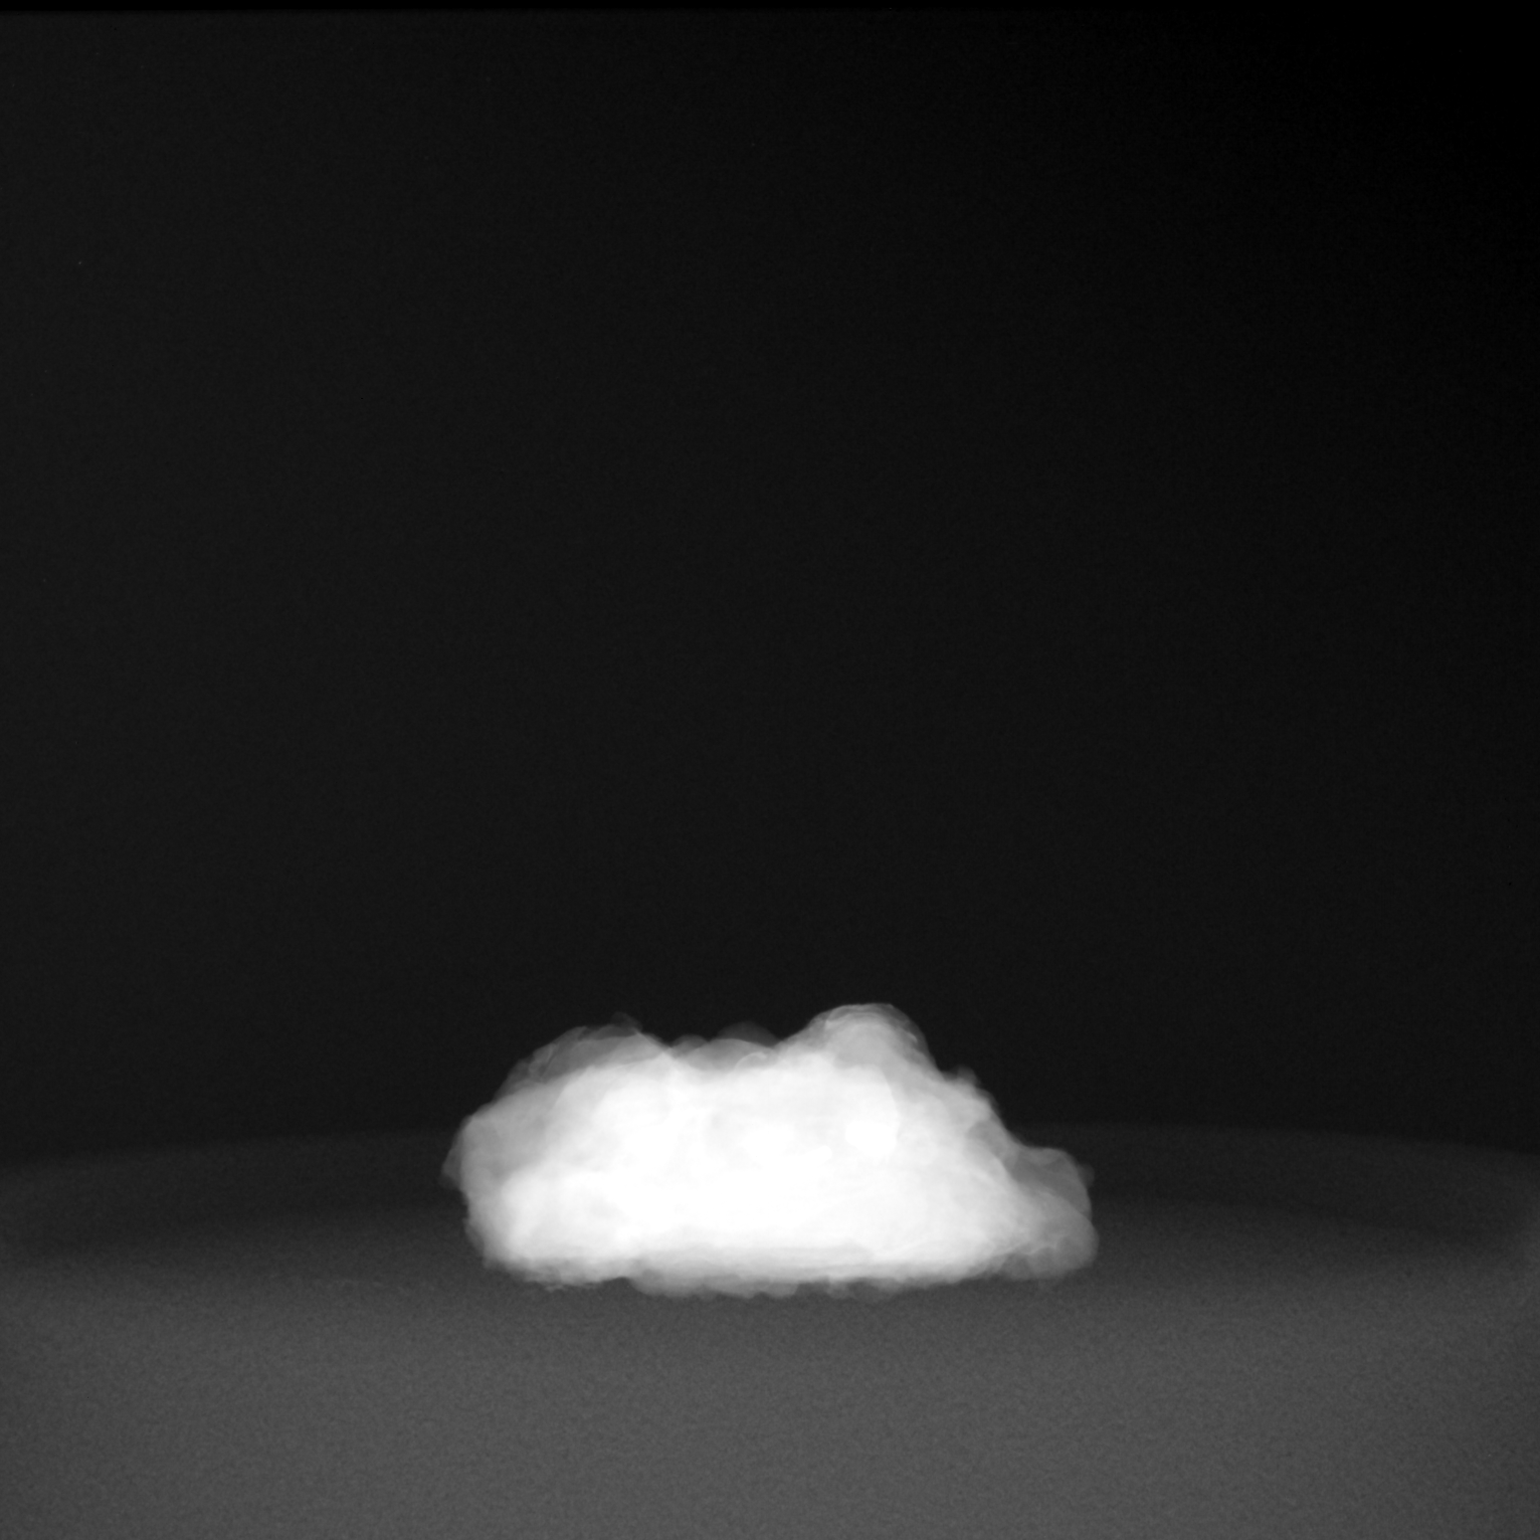

[2 of 2 positions shown; findings below may reference images not displayed]

FINDINGS: Status post excision of the left breast. The radioactive seed and
biopsy marker clip are present, completely intact, and were marked
for pathology.
IMPRESSION: Specimen radiograph of the left breast.

## 2021-01-14 ENCOUNTER — Encounter: Payer: Self-pay | Admitting: Medical

## 2021-01-14 DIAGNOSIS — R112 Nausea with vomiting, unspecified: Secondary | ICD-10-CM

## 2021-01-14 MED ORDER — PROMETHAZINE HCL 12.5 MG PO TABS: 12.5000 mg | ORAL_TABLET | Freq: Three times a day (TID) | ORAL | 0 refills | Status: DC | PRN

## 2021-01-14 NOTE — Telephone Encounter (Signed)
Called pt back to discuss with her -she notes nausea and vomiting She has noted this for 2 days No diarrhea No one else has this at home-her husband did have COVID about 2 weeks ago but she has tested several times for self and been negative She may have sudden nausea and vomiting up to 6x day-after episodes of vomiting she feels better Low grade temp yesterday up to about 100 No belly pain No chance of pregnancy per her report She has history of controlled diabetes, also breast and ovarian cancer.  She is status post endometrial ablation She is still able to stay hydrated and drink fluids  Lab Results  Component Value Date   HGBA1C 7.3 (A) 12/04/2020    Discussed with patient, she is using Zofran but does not seem to be helping-orally stable where often and not continue to control vomiting.  I called in Phenergan for her, advised that this may make her feel drowsy.  She will avoid taking it when driving, avoid combining Zofran and Phenergan.  If she is not feeling better in the next 1 to 2 days she will need to be seen, either in the office or the ER.  She states understanding and agreement .

## 2021-01-24 ENCOUNTER — Ambulatory Visit: Payer: 59 | Admitting: Family

## 2021-01-24 ENCOUNTER — Encounter: Payer: Self-pay | Admitting: Family

## 2021-01-24 ENCOUNTER — Other Ambulatory Visit: Payer: Self-pay

## 2021-01-24 VITALS — BP 100/60 | HR 94 | Temp 98.1°F | Ht 64.0 in | Wt 160.8 lb

## 2021-01-24 DIAGNOSIS — R112 Nausea with vomiting, unspecified: Secondary | ICD-10-CM

## 2021-01-24 DIAGNOSIS — R110 Nausea: Secondary | ICD-10-CM | POA: Diagnosis not present

## 2021-01-24 DIAGNOSIS — T50905A Adverse effect of unspecified drugs, medicaments and biological substances, initial encounter: Secondary | ICD-10-CM | POA: Diagnosis not present

## 2021-01-24 NOTE — Progress Notes (Signed)
Carolyn Wilson is a 50 y.o. female with the following history as recorded in EpicCare:  Patient Active Problem List   Diagnosis Date Noted   Fascial hernia 11/29/2020   Wrist sprain, left, subsequent encounter 08/14/2020   Dyslipidemia 07/31/2020   Type 2 diabetes mellitus without complication, without long-term current use of insulin ([place]) 07/31/2020   Tachycardia 08/05/2017   Right shoulder pain 12/18/2016   Wellness examination 11/21/2015   Diabetes ([place]) 09/04/2015   Allergy with anaphylaxis due to peanuts 08/13/2014   Abnormal Pap smear of cervix  remote Le[place]  1996 per pt 08/13/2014   Allergic rhinitis 08/13/2014   S/P endometrial ablation 08/13/2014   GERD (gastroesophageal reflux disease) 08/13/2014   Diarrhea post cholecystectomy since 1999 08/13/2014   FHx: colon cancer  father  08/13/2014   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 30 tablet 2   gabapentin (NEURONTIN) 300 MG capsule Take 1 capsule (300 mg total) by mouth at bedtime. 90 capsule 4   Lancets (ONETOUCH ULTRASOFT) lancets Check blood sugar daily as directed. 100 each 12   letrozole (FEMARA) 2.5 MG tablet Take 1 tablet (2.5 mg total) by mouth daily. 30 tablet 6   metoprolol tartrate (LOPRESSOR) 25 MG tablet Take 1 tablet (25 mg total) by mouth 2 (two) times daily. Patient needs an appointment for further refills. 2 nd attempt 30 tablet 0   omeprazole (PRILOSEC) 20 MG capsule TAKE ONE CAPSULE BY MOUTH ONE TIME DAILY 90 capsule 3   ondansetron (ZOFRAN-ODT) 8 MG disintegrating tablet DISSOLVE ONE TABLET BY MOUTH EVERY 8 HOURS AS NEEDED FOR NAUSEA AND VOMITING 20 tablet 0   ONETOUCH VERIO test strip USE TO TEST ONE TIME DAILY AS  DIRECTED 100 strip 11   pioglitazone (ACTOS) 15 MG tablet Take 1 tablet (15 mg total) by mouth daily. 90 tablet 2   promethazine (PHENERGAN) 12.5 MG tablet Take 1 tablet (12.5 mg total) by mouth every 8 (eight) hours as needed for nausea or vomiting. 20 tablet 0   rosuvastatin (CRESTOR) 5 MG tablet Take 1 tablet (5 mg total) by mouth as directed. ONce weekly 30 tablet 3   Semaglutide (RYBELSUS) 7 MG TABS Take 7 mg by mouth daily. 90 tablet 2   vitamin C (ASCORBIC ACID) 500 MG tablet Take 500 mg by mouth daily.     No current facility-administered medications for this visit.    Allergies: Latex, Lidocaine, Peanut-containing drug products, Sulfa antibiotics, Reglan [metoclopramide], Metformin and related, Adhesive [tape], Ciprofloxacin, and Elemental sulfur  Past Medical History:  Diagnosis Date   Abnormal Pap smear of cervix    h/o colposcopy/biopsy   Allergy    Asthma    HX   Breast cancer ([place]) 2007   Cancer [facility]) [id number]   ovarian   Diabetes mellitus without complication ([place]) 93/8101    diagnosed by PCP, [name]    GERD (gastroesophageal reflux disease)    Hyperlipemia    on medication    PONV (postoperative nausea and vomiting)     Past Surgical History:  Procedure Laterality Date   APPENDECTOMY     age [age]   BREAST EXCISIONAL BIOPSY Left 08/2019   BREAST LUMPECTOMY Left 2008   left  breast   CHOLECYSTECTOMY  2000   ENDOMETRIAL ABLATION  2009   LAPAROSCOPIC SALPINGO OOPHERECTOMY Left 1996   with right tubal ligation   RADIOACTIVE SEED GUIDED EXCISIONAL BREAST BIOPSY Left 09/13/2019   Procedure: RADIOACTIVE SEED GUIDED EXCISIONAL LEFT BREAST BIOPSY;  Surgeon: [name], MD;  Location: [facility];  Service: General;  Laterality: Left;   TUBAL LIGATION      Family History  Problem Relation Age of Onset   Diabetes Mother    Colon cancer Father    Breast cancer Maternal Aunt    Ovarian cancer Maternal Aunt    Ovarian cancer Cousin    Colon  cancer Cousin    Melanoma Cousin    Breast cancer Maternal Grandmother    Cancer Other        breast and ovarian-maternal side    Social History   Tobacco Use   Smoking status: Never   Smokeless tobacco: Never   Tobacco comments:    NEVER USED TOBACCO  Substance Use Topics   Alcohol use: Yes    Alcohol/week: 1.0 standard drink    Types: 1 Standard drinks or equivalent per week    Subjective:  Patient complaining of 2 week history of nausea; no diarrhea; + vomiting; has taken Nexium in the past/ currently on Omeprazole- over 20 years while being worked up for gallbladder issues and has had no GERD symptoms since that time; no changes in bowel habits;  Does get some relief with Phenergan but causes to be too drowsy;  Did start Rybelsus 3 mg at end of May- up to 7 mg by mid-June; nausea seemed to start within 2 weeks of starting the 7 mg dosage;      Objective:  Vitals:   01/24/21 1538  BP: 100/60  Pulse: 94  Temp: 98.1 F (36.7 C)  TempSrc: Oral  SpO2: 99%  Weight: 160 lb 12.8 oz (72.9 kg)  Height: _0  (1.626 m)    General: Well developed, well nourished, in no acute distress  Skin : Warm and dry.  Head: Normocephalic and atraumatic  Eyes: Sclera and conjunctiva clear; pupils round and reactive to light; extraocular movements intact  Ears: External normal; canals clear; tympanic membranes normal  Oropharynx: Pink, supple. No suspicious lesions  Neck: Supple without thyromegaly, adenopathy  Lungs: Respirations unlabored; clear to auscultation bilaterally without wheeze, rales, rhonchi  CVS exam: normal rate and regular rhythm.  Abdomen: Soft; nontender; nondistended; normoactive bowel sounds; no masses or hepatosplenomegaly  Neurologic: Alert and oriented; speech intact; face symmetrical; moves all extremities well; CNII-XII intact without focal deficit   Assessment:  1. Nausea   2. Nausea and vomiting, intractability of vomiting not specified, unspecified vomiting  type   3. Adverse effect of drug, initial encounter     Plan:   ? Medication reaction to Rybelsus; she is to hold this medication for now; check CBC, CMP, ferritin today; follow up to be determined; Need to see how she feels within 24-48 hours off Rybelsus; also to consider Protonix bid and GI referral for endoscopy;  This visit occurred during the SARS-CoV-2 public health emergency.  Safety protocols were in place, including screening questions prior to the visit, additional usage of staff PPE, and extensive cleaning of exam room while observing appropriate contact time as indicated for disinfecting solutions.    No follow-ups on file.  Orders Placed This Encounter  Procedures   CBC with Differential/Platelet   Comp Met (CMET)   Ferritin  Requested Prescriptions    No prescriptions requested or ordered in this encounter

## 2021-01-25 LAB — CBC WITH DIFFERENTIAL/PLATELET
Basophils Absolute: 0.1 10*3/uL (ref 0.0–0.1)
Basophils Relative: 0.6 % (ref 0.0–3.0)
Eosinophils Absolute: 0.1 10*3/uL (ref 0.0–0.7)
Eosinophils Relative: 1.8 % (ref 0.0–5.0)
HCT: 41.2 % (ref 36.0–46.0)
Hemoglobin: 14 g/dL (ref 12.0–15.0)
Lymphocytes Relative: 25.4 % (ref 12.0–46.0)
Lymphs Abs: 2.1 10*3/uL (ref 0.7–4.0)
MCHC: 33.9 g/dL (ref 30.0–36.0)
MCV: 89.6 fl (ref 78.0–100.0)
Monocytes Absolute: 0.5 10*3/uL (ref 0.1–1.0)
Monocytes Relative: 6.5 % (ref 3.0–12.0)
Neutro Abs: 5.4 10*3/uL (ref 1.4–7.7)
Neutrophils Relative %: 65.7 % (ref 43.0–77.0)
Platelets: 284 10*3/uL (ref 150.0–400.0)
RBC: 4.6 Mil/uL (ref 3.87–5.11)
RDW: 13.2 % (ref 11.5–15.5)
WBC: 8.2 10*3/uL (ref 4.0–10.5)

## 2021-01-25 LAB — COMPREHENSIVE METABOLIC PANEL
ALT: 41 U/L — ABNORMAL HIGH (ref 0–35)
AST: 36 U/L (ref 0–37)
Albumin: 4.6 g/dL (ref 3.5–5.2)
Alkaline Phosphatase: 77 U/L (ref 39–117)
BUN: 11 mg/dL (ref 6–23)
CO2: 30 mEq/L (ref 19–32)
Calcium: 10.1 mg/dL (ref 8.4–10.5)
Chloride: 101 mEq/L (ref 96–112)
Creatinine, Ser: 1.01 mg/dL (ref 0.40–1.20)
GFR: 65.32 mL/min (ref >60.00)
Glucose, Bld: 143 mg/dL — ABNORMAL HIGH (ref 70–99)
Potassium: 4.6 mEq/L (ref 3.5–5.1)
Sodium: 142 mEq/L (ref 135–145)
Total Bilirubin: 0.4 mg/dL (ref 0.2–1.2)
Total Protein: 7.5 g/dL (ref 6.0–8.3)

## 2021-01-25 LAB — FERRITIN: Ferritin: 28.9 ng/mL (ref 10.0–291.0)

## 2021-01-27 ENCOUNTER — Encounter: Payer: Self-pay | Admitting: Internal Medicine

## 2021-01-30 ENCOUNTER — Encounter: Payer: Self-pay | Admitting: Internal Medicine

## 2021-02-03 MED ORDER — EMPAGLIFLOZIN 10 MG PO TABS: 10.0000 mg | ORAL_TABLET | Freq: Every day | ORAL | 6 refills | Status: DC

## 2021-02-04 ENCOUNTER — Encounter: Payer: Self-pay | Admitting: Medical

## 2021-02-04 ENCOUNTER — Ambulatory Visit
Admission: RE | Admit: 2021-02-04 | Discharge: 2021-02-04 | Disposition: A | Discharge status: Home / Self Care | Payer: 59 | Source: Ambulatory Visit | Attending: Obstetrics & Gynecology | Admitting: Obstetrics & Gynecology

## 2021-02-04 ENCOUNTER — Other Ambulatory Visit: Payer: Self-pay

## 2021-02-04 ENCOUNTER — Telehealth: Payer: Self-pay

## 2021-02-04 DIAGNOSIS — Z1231 Encounter for screening mammogram for malignant neoplasm of breast: Secondary | ICD-10-CM

## 2021-02-04 NOTE — Telephone Encounter (Signed)
Pt is requesting TOC from Mackie Pai, PA to Jodi Mourning, FNP.  Pt stated she would prefer a female provider when it comes to discussing female issues.  Please advise.

## 2021-02-08 ENCOUNTER — Encounter: Payer: Self-pay | Admitting: Family

## 2021-02-08 ENCOUNTER — Other Ambulatory Visit: Payer: Self-pay

## 2021-02-08 ENCOUNTER — Ambulatory Visit: Payer: 59 | Admitting: Family

## 2021-02-08 VITALS — BP 104/70 | HR 95 | Temp 97.6°F | Ht 63.0 in | Wt 158.5 lb

## 2021-02-08 DIAGNOSIS — E119 Type 2 diabetes mellitus without complications: Secondary | ICD-10-CM | POA: Diagnosis not present

## 2021-02-08 MED ORDER — EMPAGLIFLOZIN 10 MG PO TABS: 10.0000 mg | ORAL_TABLET | Freq: Every day | ORAL | 0 refills | Status: DC

## 2021-02-08 NOTE — Progress Notes (Signed)
Carolyn Wilson is a 50 y.o. female with the following history as recorded in EpicCare:  Patient Active Problem List   Diagnosis Date Noted   Fascial hernia 11/29/2020   Wrist sprain, left, subsequent encounter 08/14/2020   Dyslipidemia 07/31/2020   Type 2 diabetes mellitus without complication, without long-term current use of insulin ([place]) 07/31/2020   Tachycardia 08/05/2017   Right shoulder pain 12/18/2016   Wellness examination 11/21/2015   Diabetes ([place]) 09/04/2015   Allergy with anaphylaxis due to peanuts 08/13/2014   Abnormal Pap smear of cervix  remote [place]  1996 per pt 08/13/2014   Allergic rhinitis 08/13/2014   S/P endometrial ablation 08/13/2014   GERD (gastroesophageal reflux disease) 08/13/2014   Diarrhea post cholecystectomy since 1999 08/13/2014   FHx: colon cancer  father  08/13/2014   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 14 tablet 0   ezetimibe (ZETIA) 10 MG tablet Take 1 tablet (10 mg total) by mouth daily. 30 tablet 2   gabapentin (NEURONTIN) 300 MG capsule Take 1 capsule (300 mg total) by mouth at bedtime. 90 capsule 4   Lancets (ONETOUCH ULTRASOFT) lancets Check blood sugar daily as directed. 100 each 12   letrozole (FEMARA) 2.5 MG tablet Take 1 tablet (2.5 mg total) by mouth daily. 30 tablet 6   metoprolol tartrate (LOPRESSOR) 25 MG tablet Take 1 tablet (25 mg total) by mouth 2 (two) times daily. Patient needs an appointment for further refills. 2 nd attempt 30 tablet 0   omeprazole (PRILOSEC) 20 MG capsule TAKE ONE CAPSULE BY MOUTH ONE TIME DAILY 90 capsule 3   ondansetron (ZOFRAN-ODT) 8 MG disintegrating tablet DISSOLVE ONE TABLET BY MOUTH  EVERY 8 HOURS AS NEEDED FOR NAUSEA AND VOMITING 20 tablet 0   ONETOUCH VERIO test strip USE TO TEST ONE TIME DAILY AS DIRECTED 100 strip 11   pioglitazone (ACTOS) 15 MG tablet Take 1 tablet (15 mg total) by mouth daily. 90 tablet 2   promethazine (PHENERGAN) 12.5 MG tablet Take 1 tablet (12.5 mg total) by mouth every 8 (eight) hours as needed for nausea or vomiting. 20 tablet 0   rosuvastatin (CRESTOR) 5 MG tablet Take 1 tablet (5 mg total) by mouth as directed. ONce weekly 30 tablet 3   vitamin C (ASCORBIC ACID) 500 MG tablet Take 500 mg by mouth daily.     empagliflozin (JARDIANCE) 10 MG TABS tablet Take 1 tablet (10 mg total) by mouth daily before breakfast. (Patient not taking: Reported on 02/08/2021) 30 tablet 6   No current facility-administered medications for this visit.    Allergies: Latex, Lidocaine, Peanut-containing drug products, Sulfa antibiotics, Reglan [metoclopramide], Metformin and related, Adhesive [tape], Ciprofloxacin, and Elemental sulfur  Past Medical History:  Diagnosis Date   Abnormal Pap smear of cervix    h/o colposcopy/biopsy   Allergy    Asthma    HX   [facility] 2007   [id number]   [facility]   Diabetes mellitus without complication ([place]) [id number]    diagnosed by PCP, [name]    GERD (gastroesophageal reflux disease)    Hyperlipemia    on medication    PONV (postoperative nausea and vomiting)  Past Surgical History:  Procedure Laterality Date   APPENDECTOMY     age 4-9   BREAST EXCISIONAL BIOPSY Left 08/2019   BREAST LUMPECTOMY Left 2008   left breast   CHOLECYSTECTOMY  2000   ENDOMETRIAL ABLATION  2009   LAPAROSCOPIC SALPINGO OOPHERECTOMY Left 1996   with right tubal ligation   RADIOACTIVE SEED GUIDED EXCISIONAL BREAST BIOPSY Left 09/13/2019   Procedure: RADIOACTIVE SEED GUIDED EXCISIONAL LEFT BREAST BIOPSY;  Surgeon: [name], MD;  Location: [facility];  Service: General;  Laterality: Left;   TUBAL  LIGATION      Family History  Problem Relation Age of Onset   Diabetes Mother    Colon cancer Father    Breast cancer Maternal Aunt    Ovarian cancer Maternal Aunt    Ovarian cancer Cousin    Colon cancer Cousin    Melanoma Cousin    Breast cancer Maternal Grandmother    Cancer Other        breast and ovarian-maternal side    Social History   Tobacco Use   Smoking status: Never   Smokeless tobacco: Never   Tobacco comments:    NEVER USED TOBACCO  Substance Use Topics   Alcohol use: Yes    Alcohol/week: 1.0 standard drink    Types: 1 Standard drinks or equivalent per week    Subjective:  Follow up to discuss medication options for Type 2 diabetes; has recently had to stop Rybelsus due to nausea- tried cutting back to 3 mg and still could not tolerate; Endocrine has recommended trial of Jardiance as alternative but patient is concerned about side effects and would like to discuss risks/ benefits of medication before starting;     Objective:  Vitals:   02/08/21 1530  BP: 104/70  Pulse: 95  Temp: 97.6 F (36.4 C)  TempSrc: Oral  SpO2: 97%  Weight: 158 lb 8 oz (71.9 kg)  Height: '5\' 3"'$  (1.6 m)    General: Well developed, well nourished, in no acute distress  Skin : Warm and dry.  Head: Normocephalic and atraumatic  Lungs: Respirations unlabored;  Neurologic: Alert and oriented; speech intact; face symmetrical; moves all extremities well; CNII-XII intact without focal deficit   Assessment:  1. Type 2 diabetes mellitus without complication, without long-term current use of insulin ([place])     Plan:  Risks/ benefits of Jardiance discussed with patient; do think this is a good drug and explained that I have had positive experience in the past; she is given coupon to try 14 days for free trial of the medication and can call back with response.  Keep planned follow up with endocrinology;   This visit occurred during the SARS-CoV-2 public health emergency.  Safety protocols  were in place, including screening questions prior to the visit, additional usage of staff PPE, and extensive cleaning of exam room while observing appropriate contact time as indicated for disinfecting solutions.     No follow-ups on file.  No orders of the defined types were placed in this encounter.   Requested Prescriptions   Signed Prescriptions Disp Refills   empagliflozin (JARDIANCE) 10 MG TABS tablet 14 tablet 0    Sig: Take 1 tablet (10 mg total) by mouth daily before breakfast.

## 2021-02-09 ENCOUNTER — Other Ambulatory Visit: Payer: Self-pay | Admitting: Cardiology

## 2021-02-11 ENCOUNTER — Other Ambulatory Visit: Payer: Self-pay | Admitting: Obstetrics & Gynecology

## 2021-02-11 DIAGNOSIS — R928 Other abnormal and inconclusive findings on diagnostic imaging of breast: Secondary | ICD-10-CM

## 2021-02-11 NOTE — Telephone Encounter (Signed)
Metoprolol tartrate 25 mg # 14  tablets only with message patient needs appointment for future refills Final attempt

## 2021-02-13 ENCOUNTER — Other Ambulatory Visit: Payer: Self-pay

## 2021-02-13 ENCOUNTER — Ambulatory Visit
Admission: RE | Admit: 2021-02-13 | Discharge: 2021-02-13 | Disposition: A | Discharge status: Home / Self Care | Payer: 59 | Source: Ambulatory Visit | Attending: Obstetrics & Gynecology | Admitting: Obstetrics & Gynecology

## 2021-02-13 ENCOUNTER — Encounter: Payer: Self-pay | Admitting: Family

## 2021-02-13 DIAGNOSIS — R928 Other abnormal and inconclusive findings on diagnostic imaging of breast: Secondary | ICD-10-CM

## 2021-02-13 MED ORDER — METOPROLOL TARTRATE 25 MG PO TABS: 25.0000 mg | ORAL_TABLET | Freq: Two times a day (BID) | ORAL | 3 refills | Status: DC

## 2021-03-01 ENCOUNTER — Encounter: Payer: Self-pay | Admitting: Hematology & Oncology

## 2021-03-01 ENCOUNTER — Inpatient Hospital Stay: Payer: 59 | Attending: Hematology & Oncology

## 2021-03-01 ENCOUNTER — Inpatient Hospital Stay: Payer: 59 | Admitting: Hematology & Oncology

## 2021-03-01 ENCOUNTER — Other Ambulatory Visit: Payer: Self-pay

## 2021-03-01 VITALS — BP 102/78 | HR 75 | Temp 98.4°F | Resp 16 | Wt 157.0 lb

## 2021-03-01 DIAGNOSIS — N6092 Unspecified benign mammary dysplasia of left breast: Secondary | ICD-10-CM | POA: Insufficient documentation

## 2021-03-01 DIAGNOSIS — Z803 Family history of malignant neoplasm of breast: Secondary | ICD-10-CM

## 2021-03-01 DIAGNOSIS — N6091 Unspecified benign mammary dysplasia of right breast: Secondary | ICD-10-CM | POA: Diagnosis not present

## 2021-03-01 DIAGNOSIS — Z8543 Personal history of malignant neoplasm of ovary: Secondary | ICD-10-CM | POA: Insufficient documentation

## 2021-03-01 DIAGNOSIS — E119 Type 2 diabetes mellitus without complications: Secondary | ICD-10-CM | POA: Diagnosis not present

## 2021-03-01 DIAGNOSIS — D270 Benign neoplasm of right ovary: Secondary | ICD-10-CM

## 2021-03-01 LAB — CMP (CANCER CENTER ONLY)
ALT: 59 U/L — ABNORMAL HIGH (ref 0–44)
AST: 41 U/L (ref 15–41)
Albumin: 4.3 g/dL (ref 3.5–5.0)
Alkaline Phosphatase: 94 U/L (ref 38–126)
Anion gap: 8 (ref 5–15)
BUN: 14 mg/dL (ref 6–20)
CO2: 31 mmol/L (ref 22–32)
Calcium: 10.2 mg/dL (ref 8.9–10.3)
Chloride: 103 mmol/L (ref 98–111)
Creatinine: 1.1 mg/dL — ABNORMAL HIGH (ref 0.44–1.00)
GFR, Estimated: >60 mL/min (ref >60)
Glucose, Bld: 177 mg/dL — ABNORMAL HIGH (ref 70–99)
Potassium: 4.5 mmol/L (ref 3.5–5.1)
Sodium: 142 mmol/L (ref 135–145)
Total Bilirubin: 0.6 mg/dL (ref 0.3–1.2)
Total Protein: 6.7 g/dL (ref 6.5–8.1)

## 2021-03-01 LAB — CBC WITH DIFFERENTIAL (CANCER CENTER ONLY)
Abs Immature Granulocytes: 0.02 10*3/uL (ref 0.00–0.07)
Basophils Absolute: 0 10*3/uL (ref 0.0–0.1)
Basophils Relative: 1 %
Eosinophils Absolute: 0.2 10*3/uL (ref 0.0–0.5)
Eosinophils Relative: 3 %
HCT: 41.1 % (ref 36.0–46.0)
Hemoglobin: 13.6 g/dL (ref 12.0–15.0)
Immature Granulocytes: 0 %
Lymphocytes Relative: 33 %
Lymphs Abs: 2.1 10*3/uL (ref 0.7–4.0)
MCH: 30.2 pg (ref 26.0–34.0)
MCHC: 33.1 g/dL (ref 30.0–36.0)
MCV: 91.3 fL (ref 80.0–100.0)
Monocytes Absolute: 0.4 10*3/uL (ref 0.1–1.0)
Monocytes Relative: 7 %
Neutro Abs: 3.6 10*3/uL (ref 1.7–7.7)
Neutrophils Relative %: 56 %
Platelet Count: 271 10*3/uL (ref 150–400)
RBC: 4.5 MIL/uL (ref 3.87–5.11)
RDW: 13 % (ref 11.5–15.5)
WBC Count: 6.4 10*3/uL (ref 4.0–10.5)
nRBC: 0 % (ref 0.0–0.2)

## 2021-03-01 NOTE — Progress Notes (Signed)
Hematology and Oncology Follow Up Visit  Carolyn Wilson [id number] 10-08-1970 50 y.o. 03/01/2021   Principle Diagnosis:  1. History of Sertoli-Leydig tumor of the left ovary  2. Strong family history of breast and ovarian cancer - BRCA 1/2 and p53 negative 3. 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 Complex Sclerosing Lesion with usual ductal hyperplasia of the LEFT breast (central)   Current Therapy:         Femara 2.5 po  q day - start on 10/31/2020   Interim History:  [name] is here today for follow-up.  We last saw her back in January.  Since then, she has been doing quite nicely.  She has been very busy.  Her mom just got out of the hospital.  She will go up to see her in [place].  She also was out in [place].  She and her husband were there for the weekend.  They had a wonderful time.  She is still quite active.  Her blood sugars are doing fairly well.  This morning they are little bit high at 177.  She has had no problems with the Femara.  There is no bony aches or pains..  She has had no problems with bowels or bladder.  She has had no bleeding.  She has had no nausea or vomiting.  She did have a lot of nausea and vomiting with one of her diabetic medications.  This was stopped.  She has had no leg swelling.  Overall, I would say performance status is ECOG 1.    Medications:  Allergies as of 03/01/2021       Reactions   Latex Rash   Causes blisters   Lidocaine Nausea And Vomiting   Peanut-containing Drug Products Anaphylaxis   Sulfa Antibiotics Shortness Of Breath, Rash   Reglan [metoclopramide] Tinitus   Metformin And Related Diarrhea   Adhesive [tape] Rash   Ciprofloxacin Rash   Elemental Sulfur Rash        Medication List        Accurate as of March 01, 2021  8:33 AM. If you have any questions, ask your nurse or doctor.          empagliflozin 10 MG Tabs tablet Commonly known as:  Jardiance Take 1 tablet (10 mg total) by mouth daily before breakfast. What changed: Another medication with the same name was removed. Continue taking this medication, and follow the directions you see here. Changed by: Volanda Napoleon, MD   ezetimibe 10 MG tablet Commonly known as: ZETIA Take 1 tablet (10 mg total) by mouth daily.   gabapentin 300 MG capsule Commonly known as: Neurontin Take 1 capsule (300 mg total) by mouth at bedtime.   letrozole 2.5 MG tablet Commonly known as: FEMARA Take 1 tablet (2.5 mg total) by mouth daily.   metoprolol tartrate 25 MG tablet Commonly known as: LOPRESSOR Take 1 tablet (25 mg total) by mouth 2 (two) times daily.   omeprazole 20 MG capsule Commonly known as: PRILOSEC TAKE ONE CAPSULE BY MOUTH ONE TIME DAILY   ondansetron 8 MG disintegrating tablet Commonly known as: ZOFRAN-ODT DISSOLVE ONE TABLET BY MOUTH EVERY 8 HOURS AS NEEDED FOR NAUSEA AND VOMITING   onetouch ultrasoft lancets Check blood sugar daily as directed.   OneTouch Verio test strip Generic drug: glucose blood USE TO TEST ONE TIME DAILY AS DIRECTED   pioglitazone 15 MG tablet Commonly known as: ACTOS Take 1 tablet (15 mg total)  by mouth daily.   promethazine 12.5 MG tablet Commonly known as: PHENERGAN Take 1 tablet (12.5 mg total) by mouth every 8 (eight) hours as needed for nausea or vomiting.   rosuvastatin 5 MG tablet Commonly known as: Crestor Take 1 tablet (5 mg total) by mouth as directed. ONce weekly   vitamin C 500 MG tablet Commonly known as: ASCORBIC ACID Take 500 mg by mouth daily.        Allergies:  Allergies  Allergen Reactions   Latex Rash    Causes blisters   Lidocaine Nausea And Vomiting   Peanut-Containing Drug Products Anaphylaxis   Sulfa Antibiotics Shortness Of Breath and Rash   Reglan [Metoclopramide] Tinitus   Metformin And Related Diarrhea   Adhesive [Tape] Rash   Ciprofloxacin Rash   Elemental Sulfur Rash    Past Medical  History, Surgical history, Social history, and Family History were reviewed and updated.  Review of Systems: Review of Systems  Constitutional: Negative.   HENT: Negative.    Eyes: Negative.   Respiratory: Negative.    Cardiovascular: Negative.   Gastrointestinal: Negative.   Genitourinary: Negative.   Musculoskeletal: Negative.   Skin: Negative.   Neurological: Negative.   Endo/Heme/Allergies: Negative.   Psychiatric/Behavioral: Negative.    [name]   Physical Exam:  weight is 157 lb (71.2 kg).   Wt Readings from Last 3 Encounters:  03/01/21 157 lb (71.2 kg)  02/08/21 158 lb 8 oz (71.9 kg)  01/24/21 160 lb 12.8 oz (72.9 kg)    Physical Exam Vitals reviewed.  Constitutional:      Comments: Her breast exam shows right breast with no masses, edema or erythema. She has the biopsy site at about the 10 o'clock position on the right breast. There is no erythema or ecchymosis associated with this. There is no right axillary adenopathy.  Left breast shows the lumpectomy scar at the edge of the areola at 3:00.  There is no erythema or warmth.  There is no discharge noted.  There is no tenderness to palpation.  There is no left axillary adenopathy.  HENT:     Head: Normocephalic and atraumatic.  Eyes:     Pupils: Pupils are equal, round, and reactive to light.  Cardiovascular:     Rate and Rhythm: Normal rate and regular rhythm.     Heart sounds: Normal heart sounds.  Pulmonary:     Effort: Pulmonary effort is normal.     Breath sounds: Normal breath sounds.  Abdominal:     General: Bowel sounds are normal.     Palpations: Abdomen is soft.  Musculoskeletal:        General: No tenderness or deformity. Normal range of motion.     Cervical back: Normal range of motion.  Lymphadenopathy:     Cervical: No cervical adenopathy.  Skin:    General: Skin is warm and dry.     Findings: No erythema or rash.  Neurological:     Mental Status: She is alert and oriented to person, place, and  time.  Psychiatric:        Behavior: Behavior normal.        Thought Content: Thought content normal.        Judgment: Judgment normal.     Lab Results  Component Value Date   WBC 6.4 03/01/2021   HGB 13.6 03/01/2021   HCT 41.1 03/01/2021   MCV 91.3 03/01/2021   PLT 271 03/01/2021   Lab Results  Component Value Date  FERRITIN 28.9 01/24/2021   Lab Results  Component Value Date   RBC 4.50 03/01/2021   No results found for: KPAFRELGTCHN, LAMBDASER, KAPLAMBRATIO No results found for: IGGSERUM, IGA, IGMSERUM No results found for: Odetta Pink, SPEI   Chemistry      Component Value Date/Time   NA 142 03/01/2021 0754   NA 142 11/01/2019 0902   NA 144 03/11/2017 0822   K 4.5 03/01/2021 0754   K 4.5 03/11/2017 0822   CL 103 03/01/2021 0754   CL 104 03/11/2017 0822   CO2 31 03/01/2021 0754   CO2 29 03/11/2017 0822   BUN 14 03/01/2021 0754   BUN 14 11/01/2019 0902   BUN 9 03/11/2017 0822   CREATININE 1.10 (H) 03/01/2021 0754   CREATININE 0.82 04/05/2020 0732      Component Value Date/Time   CALCIUM 10.2 03/01/2021 0754   CALCIUM 9.2 03/11/2017 0822   ALKPHOS 94 03/01/2021 0754   ALKPHOS 65 03/11/2017 0822   AST 41 03/01/2021 0754   ALT 59 (H) 03/01/2021 0754   ALT 25 03/11/2017 0822   BILITOT 0.6 03/01/2021 0754       Impression and Plan: Ms. 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   She was diagnosed with stromal hyperplasia of the right breast and ductal hyperplasia of the left breast in December 2020.    She had a left breast lumpectomy with radioactive seed placement in February 2021.   Now, she has a similar situation in the right breast.  She is doing well on the Femara.  We will keep her on Femara.  I will see any issues with Femara.  Letter diabetes is doing well.  I think we get through all  the holidays now.  I will get her back in January.   Volanda Napoleon, MD 8/12/20228:33 AM

## 2021-03-06 ENCOUNTER — Other Ambulatory Visit: Payer: Self-pay | Admitting: Internal Medicine

## 2021-03-06 DIAGNOSIS — E1169 Type 2 diabetes mellitus with other specified complication: Secondary | ICD-10-CM

## 2021-03-06 DIAGNOSIS — E785 Hyperlipidemia, unspecified: Secondary | ICD-10-CM

## 2021-03-06 MED ORDER — EZETIMIBE 10 MG PO TABS: 10.0000 mg | ORAL_TABLET | Freq: Every day | ORAL | 3 refills | Status: DC

## 2021-03-18 ENCOUNTER — Encounter (HOSPITAL_BASED_OUTPATIENT_CLINIC_OR_DEPARTMENT_OTHER): Payer: Self-pay

## 2021-03-19 ENCOUNTER — Ambulatory Visit: Payer: 59 | Admitting: Cardiology

## 2021-03-27 ENCOUNTER — Encounter: Payer: Self-pay | Admitting: Family Medicine

## 2021-03-29 ENCOUNTER — Other Ambulatory Visit: Payer: Self-pay

## 2021-03-29 ENCOUNTER — Ambulatory Visit (HOSPITAL_BASED_OUTPATIENT_CLINIC_OR_DEPARTMENT_OTHER)
Admission: RE | Admit: 2021-03-29 | Discharge: 2021-03-29 | Disposition: A | Discharge status: Home / Self Care | Payer: 59 | Source: Ambulatory Visit | Attending: Medical | Admitting: Medical

## 2021-03-29 ENCOUNTER — Encounter: Payer: Self-pay | Admitting: Medical

## 2021-03-29 ENCOUNTER — Ambulatory Visit: Payer: 59 | Admitting: Medical

## 2021-03-29 VITALS — BP 101/68 | HR 100 | Temp 98.6°F | Resp 18 | Ht 63.0 in | Wt 157.2 lb

## 2021-03-29 DIAGNOSIS — R509 Fever, unspecified: Secondary | ICD-10-CM | POA: Insufficient documentation

## 2021-03-29 DIAGNOSIS — J029 Acute pharyngitis, unspecified: Secondary | ICD-10-CM | POA: Diagnosis not present

## 2021-03-29 DIAGNOSIS — R059 Cough, unspecified: Secondary | ICD-10-CM | POA: Insufficient documentation

## 2021-03-29 DIAGNOSIS — M791 Myalgia, unspecified site: Secondary | ICD-10-CM

## 2021-03-29 LAB — POCT RAPID STREP A (OFFICE): Rapid Strep A Screen: NEGATIVE

## 2021-03-29 MED ORDER — BENZONATATE 100 MG PO CAPS: 100.0000 mg | ORAL_CAPSULE | Freq: Three times a day (TID) | ORAL | 0 refills | Status: DC | PRN

## 2021-03-29 MED ORDER — AZITHROMYCIN 250 MG PO TABS: ORAL_TABLET | ORAL | 0 refills | Status: AC

## 2021-03-29 MED ORDER — FLUTICASONE PROPIONATE 50 MCG/ACT NA SUSP: 2.0000 | Freq: Every day | NASAL | 1 refills | Status: DC

## 2021-03-29 NOTE — Progress Notes (Signed)
Subjective:    Patient ID: Carolyn Wilson, female    DOB: 1971-07-13, 50 y.o.   MRN: [id number]  HPI Pt woke up with ha and not feeling great. Working long hours and stressed.  Symptoms started on Tuesday with ha. Fatigued,st, hoarse voice, some chest congestion and having some cough. Cough up mucus once but brown.  Fever 102 this morning. Took tylenol at 1 pm.   Pt tested negative for covid twice this week. Rapid test.  Vaccinated against covid x 3.      Review of Systems  Constitutional:  Positive for fatigue and fever. Negative for chills.  HENT:  Positive for congestion. Negative for dental problem, mouth sores and sinus pressure.   Respiratory:  Positive for cough. Negative for choking, chest tightness, shortness of breath and wheezing.   Cardiovascular:  Negative for chest pain and palpitations.  Gastrointestinal:  Negative for abdominal pain, blood in stool and diarrhea.  Musculoskeletal:  Positive for back pain and myalgias.       Upper back sore mild.  Neurological:  Negative for dizziness and headaches.  Hematological:  Negative for adenopathy. Does not bruise/bleed easily.  Psychiatric/Behavioral:  Negative for behavioral problems.    Past Medical History:  Diagnosis Date   Abnormal Pap smear of cervix    h/o colposcopy/biopsy   Allergy    Asthma    HX   Breast cancer ([place]) 2007   Cancer [facility]) [id number]   ovarian   Diabetes mellitus without complication ([place]) [id number]    diagnosed by PCP, [name]    GERD (gastroesophageal reflux disease)    Hyperlipemia    on medication    PONV (postoperative nausea and vomiting)      Social History   Socioeconomic History   Marital status: Married    Spouse name: Not on file   Number of children: Not on file   Years of education: Not on file   Highest education level: Not on file  Occupational History   Not on file  Tobacco Use   Smoking status: Never   Smokeless tobacco: Never   Tobacco comments:     NEVER USED TOBACCO  Vaping Use   Vaping Use: Never used  Substance and Sexual Activity   Alcohol use: Yes    Alcohol/week: 1.0 standard drink    Types: 1 Standard drinks or equivalent per week   Drug use: No   Sexual activity: Yes    Partners: Male    Birth control/protection: Surgical  Other Topics Concern   Not on file  Social History Narrative   ** Merged History Encounter **       Social Determinants of Health   Financial Resource Strain: Not on file  Food Insecurity: Not on file  Transportation Needs: Not on file  Physical Activity: Not on file  Stress: Not on file  Social Connections: Not on file  Intimate Partner Violence: Not on file    Past Surgical History:  Procedure Laterality Date   APPENDECTOMY     age 47-9   BREAST EXCISIONAL BIOPSY Left 08/2019   BREAST LUMPECTOMY Left 2008   left breast   CHOLECYSTECTOMY  2000   ENDOMETRIAL ABLATION  2009   LAPAROSCOPIC SALPINGO OOPHERECTOMY Left 1996   with right tubal ligation   RADIOACTIVE SEED GUIDED EXCISIONAL BREAST BIOPSY Left 09/13/2019   Procedure: RADIOACTIVE SEED GUIDED EXCISIONAL LEFT BREAST BIOPSY;  Surgeon: [name], MD;  Location: [facility];  Service: General;  Laterality:  Left;   TUBAL LIGATION      Family History  Problem Relation Age of Onset   Diabetes Mother    Colon cancer Father    Breast cancer Maternal Aunt    Ovarian cancer Maternal Aunt    Ovarian cancer Cousin    Colon cancer Cousin    Melanoma Cousin    Breast cancer Maternal Grandmother    Cancer Other        breast and ovarian-maternal side    Allergies  Allergen Reactions   Latex Rash    Causes blisters   Lidocaine Nausea And Vomiting   Peanut-Containing Drug Products Anaphylaxis   Sulfa Antibiotics Shortness Of Breath and Rash   Reglan [Metoclopramide] Tinitus   Metformin And Related Diarrhea   Adhesive [Tape] Rash   Ciprofloxacin Rash   Elemental Sulfur Rash    Current Outpatient  Medications on File Prior to Visit  Medication Sig Dispense Refill   empagliflozin (JARDIANCE) 10 MG TABS tablet Take 1 tablet (10 mg total) by mouth daily before breakfast. 14 tablet 0   ezetimibe (ZETIA) 10 MG tablet Take 1 tablet (10 mg total) by mouth daily. 90 tablet 3   gabapentin (NEURONTIN) 300 MG capsule Take 1 capsule (300 mg total) by mouth at bedtime. 90 capsule 4   Lancets (ONETOUCH ULTRASOFT) lancets Check blood sugar daily as directed. 100 each 12   letrozole (FEMARA) 2.5 MG tablet Take 1 tablet (2.5 mg total) by mouth daily. 30 tablet 6   metoprolol tartrate (LOPRESSOR) 25 MG tablet Take 1 tablet (25 mg total) by mouth 2 (two) times daily. 180 tablet 3   omeprazole (PRILOSEC) 20 MG capsule TAKE ONE CAPSULE BY MOUTH ONE TIME DAILY 90 capsule 3   ondansetron (ZOFRAN-ODT) 8 MG disintegrating tablet DISSOLVE ONE TABLET BY MOUTH EVERY 8 HOURS AS NEEDED FOR NAUSEA AND VOMITING 20 tablet 0   ONETOUCH VERIO test strip USE TO TEST ONE TIME DAILY AS DIRECTED 100 strip 11   pioglitazone (ACTOS) 15 MG tablet Take 1 tablet (15 mg total) by mouth daily. 90 tablet 2   promethazine (PHENERGAN) 12.5 MG tablet Take 1 tablet (12.5 mg total) by mouth every 8 (eight) hours as needed for nausea or vomiting. 20 tablet 0   rosuvastatin (CRESTOR) 5 MG tablet Take 1 tablet (5 mg total) by mouth as directed. ONce weekly 30 tablet 3   vitamin C (ASCORBIC ACID) 500 MG tablet Take 500 mg by mouth daily.     No current facility-administered medications on file prior to visit.    BP 96/79   Pulse 100   Temp 98.6 F (37 C)   Resp 18   Ht '5\' 3"'$  (1.6 m)   Wt 157 lb 3.2 oz (71.3 kg)   LMP 04/27/2017 Comment: spotting  SpO2 99%   BMI 27.85 kg/m        Objective:   Physical Exam  General- No acute distress. Pleasant patient. Neck- Full range of motion, no jvd Lungs- Clear, even and unlabored. Heart- regular rate and rhythm. Neurologic- CNII- XII grossly intact.  Heent- no frontal or maxillary  sinus pressure. Throat- mild redness to pharynx.    Assessment & Plan:   Patient Instructions  For fever, st, myalgia and cough we did rapid strep, send out culture and pcr covid test.   Also got cxr today.  Prescribing azithromycin to start today. Benzonatate for cough and flonase for nasal congetsion.  Use tylenol for fever.  Well update you on studies  as they come in.  If symptoms change or worsen let us know.  Follow up in 7 days or sooner if needed.   Mackie Pai, PA-C

## 2021-03-29 NOTE — Patient Instructions (Addendum)
For fever, st, myalgia and cough we did rapid strep, send out throat culture and pcr covid test.   Also got cxr today.  Prescribing azithromycin to start today. Benzonatate for cough and flonase for nasal congestion.  Use tylenol for fever.  Well update you on studies as they come in.  If symptoms change or worsen let us know.  Follow up in 7 days or sooner if needed.

## 2021-03-30 LAB — NOVEL CORONAVIRUS, NAA: SARS-CoV-2, NAA: NOT DETECTED

## 2021-03-30 LAB — SARS-COV-2, NAA 2 DAY TAT

## 2021-03-31 ENCOUNTER — Encounter: Payer: Self-pay | Admitting: Medical

## 2021-03-31 LAB — CULTURE, GROUP A STREP
MICRO NUMBER:: 12353556
SPECIMEN QUALITY:: ADEQUATE

## 2021-04-09 ENCOUNTER — Other Ambulatory Visit: Payer: Self-pay

## 2021-04-09 ENCOUNTER — Ambulatory Visit: Payer: 59 | Admitting: Internal Medicine

## 2021-04-09 ENCOUNTER — Encounter: Payer: Self-pay | Admitting: Internal Medicine

## 2021-04-09 VITALS — BP 118/74 | HR 92 | Ht 63.0 in | Wt 157.8 lb

## 2021-04-09 DIAGNOSIS — E119 Type 2 diabetes mellitus without complications: Secondary | ICD-10-CM

## 2021-04-09 LAB — POCT GLYCOSYLATED HEMOGLOBIN (HGB A1C): Hemoglobin A1C: 7.6 % — AB (ref 4.0–5.6)

## 2021-04-09 LAB — GLUCOSE, POCT (MANUAL RESULT ENTRY): POC Glucose: 230 mg/dl — AB (ref 70–99)

## 2021-04-09 MED ORDER — EMPAGLIFLOZIN 25 MG PO TABS: 25.0000 mg | ORAL_TABLET | Freq: Every day | ORAL | 2 refills | Status: DC

## 2021-04-09 MED ORDER — PIOGLITAZONE HCL 15 MG PO TABS: 15.0000 mg | ORAL_TABLET | Freq: Every day | ORAL | 2 refills | Status: DC

## 2021-04-09 NOTE — Progress Notes (Signed)
Name: Carolyn Wilson  Age/ Sex: 50 y.o., female   MRN/ DOB: [id number], [birth year]     PCP: Marrian Salvage, FNP   Reason for Endocrinology Evaluation: Type 2 Diabetes Mellitus  Initial Endocrine Consultative Visit: 04/24/2020    PATIENT IDENTIFIER: Carolyn Wilson is a 50 y.o. female with a past medical history of Asthma, Dyslipidemia and T2dm. The patient has followed with Endocrinology clinic since 04/24/2020 for consultative assistance with management of her diabetes.  DIABETIC HISTORY:  [name] was diagnosed with  DM in 2017,Metformin caused GI side effects, pioglitazone started in 02/2020. Her hemoglobin A1c has ranged from 6.2% in 2018, peaking at 7.4% in 2020.   On her initial visit to our clinic her A1c was 7.2% , she was on Onglyza and Pioglitazone which we stopped Onglyza and started Rybelsus.     Developed GI side effects and was switched to SGLT-2 inhibitors in 01/2021  SUBJECTIVE:   During the last visit (12/04/2020): A1c 7.3 % . Increased  Rybelsus and continued pioglitazone     Today (04/09/2021): Ms. Torosyan is here for a follow up on diabetes.  She checks her blood sugars 1 times daily. The patient has not had hypoglycemic episodes since the last clinic visit.  Denies nausea or diarrhea since being off  Had a recent infection ( sinus ) on Abx   HOME DIABETES REGIMEN:  Jardiance 10 mg daily  Pioglitazone 15 mg, 1 tablet with Breakfast      Statin: yes ACE-I/ARB: no   METER DOWNLOAD SUMMARY: Did not bring   140-160 mg in the morning  Afternoon 80-100 mg /dL     DIABETIC COMPLICATIONS: Microvascular complications:   Denies: CKD, retinopathy, neuropathy Last Eye Exam: Completed 01/2020  Macrovascular complications:   Denies: CAD, CVA, PVD   HISTORY:  Past Medical History:  Past Medical History:  Diagnosis Date   Abnormal Pap smear of cervix    h/o colposcopy/biopsy   Allergy    Asthma    HX   Breast cancer  ([place]) 2007   Cancer ([place]) 1996-1997   ovarian   Diabetes mellitus without complication ([place]) 69/4854    diagnosed by PCP, [name]    GERD (gastroesophageal reflux disease)    Hyperlipemia    on medication    PONV (postoperative nausea and vomiting)    Past Surgical History:  Past Surgical History:  Procedure Laterality Date   APPENDECTOMY     age [age]   BREAST EXCISIONAL BIOPSY Left 08/2019   BREAST LUMPECTOMY Left 2008   left breast   CHOLECYSTECTOMY  2000   ENDOMETRIAL ABLATION  2009   LAPAROSCOPIC SALPINGO OOPHERECTOMY Left 1996   with right tubal ligation   RADIOACTIVE SEED GUIDED EXCISIONAL BREAST BIOPSY Left 09/13/2019   Procedure: RADIOACTIVE SEED GUIDED EXCISIONAL LEFT BREAST BIOPSY;  Surgeon: [name], MD;  Location: [facility];  Service: General;  Laterality: Left;   TUBAL LIGATION     Social History:  reports that she has never smoked. She has never used smokeless tobacco. She reports current alcohol use of about 1.0 standard drink per week. She reports that she does not use drugs. Family History:  Family History  Problem Relation Age of Onset   Diabetes Mother    Colon cancer Father    Breast cancer Maternal Aunt    Ovarian cancer Maternal Aunt    Ovarian cancer Cousin    Colon cancer Cousin    Melanoma Cousin  Breast cancer Maternal Grandmother    Cancer Other        breast and ovarian-maternal side     HOME MEDICATIONS: Allergies as of 04/09/2021       Reactions   Latex Rash   Causes blisters   Lidocaine Nausea And Vomiting   Peanut-containing Drug Products Anaphylaxis   Sulfa Antibiotics Shortness Of Breath, Rash   Reglan [metoclopramide] Tinitus   Metformin And Related Diarrhea   Adhesive [tape] Rash   Ciprofloxacin Rash   Elemental Sulfur Rash        Medication List        Accurate as of April 09, 2021  8:13 AM. If you have any questions, ask your nurse or doctor.          STOP taking these  medications    benzonatate 100 MG capsule Commonly known as: TESSALON Stopped by: Dorita Sciara, MD       TAKE these medications    empagliflozin 25 MG Tabs tablet Commonly known as: Jardiance Take 1 tablet (25 mg total) by mouth daily before breakfast. What changed:  medication strength how much to take Changed by: Dorita Sciara, MD   ezetimibe 10 MG tablet Commonly known as: ZETIA Take 1 tablet (10 mg total) by mouth daily.   fluticasone 50 MCG/ACT nasal spray Commonly known as: FLONASE Place 2 sprays into both nostrils daily.   gabapentin 300 MG capsule Commonly known as: Neurontin Take 1 capsule (300 mg total) by mouth at bedtime.   letrozole 2.5 MG tablet Commonly known as: FEMARA Take 1 tablet (2.5 mg total) by mouth daily.   metoprolol tartrate 25 MG tablet Commonly known as: LOPRESSOR Take 1 tablet (25 mg total) by mouth 2 (two) times daily.   omeprazole 20 MG capsule Commonly known as: PRILOSEC TAKE ONE CAPSULE BY MOUTH ONE TIME DAILY   ondansetron 8 MG disintegrating tablet Commonly known as: ZOFRAN-ODT DISSOLVE ONE TABLET BY MOUTH EVERY 8 HOURS AS NEEDED FOR NAUSEA AND VOMITING   onetouch ultrasoft lancets Check blood sugar daily as directed.   OneTouch Verio test strip Generic drug: glucose blood USE TO TEST ONE TIME DAILY AS DIRECTED   pioglitazone 15 MG tablet Commonly known as: ACTOS Take 1 tablet (15 mg total) by mouth daily.   promethazine 12.5 MG tablet Commonly known as: PHENERGAN Take 1 tablet (12.5 mg total) by mouth every 8 (eight) hours as needed for nausea or vomiting.   rosuvastatin 5 MG tablet Commonly known as: Crestor Take 1 tablet (5 mg total) by mouth as directed. ONce weekly   vitamin C 500 MG tablet Commonly known as: ASCORBIC ACID Take 500 mg by mouth daily.         OBJECTIVE:   Vital Signs: BP 118/74 (BP Location: Left Arm, Patient Position: Sitting, Cuff Size: Small)   Pulse 92   Ht 5\' 3"   (1.6 m)   Wt 157 lb 12.8 oz (71.6 kg)   LMP 04/27/2017 Comment: spotting  SpO2 97%   BMI 27.95 kg/m   Wt Readings from Last 3 Encounters:  04/09/21 157 lb 12.8 oz (71.6 kg)  03/29/21 157 lb 3.2 oz (71.3 kg)  03/01/21 157 lb (71.2 kg)     Exam: General: Pt appears well and is in NAD  Lungs: Clear with good BS bilat with no rales, rhonchi, or wheezes  Heart: RRR with normal S1 and S2 and no gallops; no murmurs; no rub  Abdomen: Normoactive bowel sounds, soft, nontender, without  masses or organomegaly palpable  Extremities: No pretibial edema. Left 3rd toe chronic rash   Neuro: MS is good with appropriate affect, pt is alert and Ox3     DM foot exam: 9/20/222   The skin of the feet is intact without sores or ulcerations. The pedal pulses are 2+ on right and 2+ on left. The sensation is intact to a screening 5.07, 10 gram monofilament bilaterally   DATA REVIEWED:  Lab Results  Component Value Date   HGBA1C 7.6 (A) 04/09/2021   HGBA1C 7.3 (A) 12/04/2020   HGBA1C 6.7 (A) 07/31/2020   Lab Results  Component Value Date   MICROALBUR 0.7 12/04/2020   LDLCALC 128 (H) 04/05/2020   CREATININE 1.10 (H) 03/01/2021    Lab Results  Component Value Date   CHOL 210 (H) 04/05/2020   HDL 38 (L) 04/05/2020   LDLCALC 128 (H) 04/05/2020   LDLDIRECT 115.0 03/09/2018   TRIG 289 (H) 04/05/2020   CHOLHDL 5.5 (H) 04/05/2020      Results for REYANA, LEISEY DAWN "[name]" (MRN [id number]) as of 04/09/2021 07:57  Ref. Range 03/01/2021 07:54  Sodium Latest Ref Range: 135 - 145 mmol/L 142  Potassium Latest Ref Range: 3.5 - 5.1 mmol/L 4.5  Chloride Latest Ref Range: 98 - 111 mmol/L 103  CO2 Latest Ref Range: 22 - 32 mmol/L 31  Glucose Latest Ref Range: 70 - 99 mg/dL 177 (H)  BUN Latest Ref Range: 6 - 20 mg/dL 14  Creatinine Latest Ref Range: 0.44 - 1.00 mg/dL 1.10 (H)  Calcium Latest Ref Range: 8.9 - 10.3 mg/dL 10.2  Anion gap Latest Ref Range: 5 - 15  8  Alkaline Phosphatase Latest Ref  Range: 38 - 126 U/L 94  Albumin Latest Ref Range: 3.5 - 5.0 g/dL 4.3  AST Latest Ref Range: 15 - 41 U/L 41  ALT Latest Ref Range: 0 - 44 U/L 59 (H)  Total Protein Latest Ref Range: 6.5 - 8.1 g/dL 6.7  Total Bilirubin Latest Ref Range: 0.3 - 1.2 mg/dL 0.6  GFR, Est Non African American Latest Ref Range: >60 mL/min >60   Results for ZINA, PITZER DAWN "[name]" (MRN [id number]) as of 12/04/2020 12:39  Ref. Range 12/04/2020 07:57  Creatinine,U Latest Units: mg/dL 157.2  Microalb, Ur Latest Ref Range: 0.0 - 1.9 mg/dL 0.7  MICROALB/CREAT RATIO Latest Ref Range: 0.0 - 30.0 mg/g 0.5   In-Office Bg 230 mg/dL   ASSESSMENT / PLAN / RECOMMENDATIONS:   1) Type 2 Diabetes Mellitus, Sub- Optimally controlled, Without complications - Most recent A1c of 7.6%. Goal A1c < 7.0 %.    - Slight increase in A1c - Intolerant to Metformin  - Intolerant to Rybelsus  - Will increase Jardiance as below, cautioned against genital infections  - We discussed limiting CHO per meal and mixing low fat protein - She was advised to check glucose bedtime and fasting    MEDICATIONS: - Increase Jardiance 25 mg daily  - Continue Pioglitazone 15 mg, 1 tablet daily    EDUCATION / INSTRUCTIONS: BG monitoring instructions: Patient is instructed to check her blood sugars 2 times a day, fasting and bedtime  Call Early Endocrinology clinic if: BG persistently < 70  I reviewed the Rule of 15 for the treatment of hypoglycemia in detail with the patient. Literature supplied.    2) Diabetic complications:  Eye: Does not have known diabetic retinopathy.  Neuro/ Feet: Does not have known diabetic peripheral neuropathy .  Renal: Patient does not have known  baseline CKD. She   is not  on an ACEI/ARB at present.       3) Dyslipidemia: LDL above goal of 128 mg/dL in 2021. Patient is intolerant to statins, has tried 3 different ones. We started a small dose of  Rosuvastatin 5 mg once week which was started 07/2020 , tolerating  well  - Will be due for repeat lipid panel next visit      Medications Continue Rosuvastatin 5 mg, 1 tablet weekly  Continue Zetia 10 mg daily     F/U in 4 months    Signed electronically by: Mack Guise, MD  [facility] End[facility]  [facility] [address] [place], [place] [address] Phone: [phone] FAX: [phone]   CC: Marrian Salvage, [place] [address] [place] [address] Phone: [phone]  Fax: [phone]  Return to Endocrinology clinic as below: Future Appointments  Date Time Provider [place]  05/29/2021  8:45 AM Megan Salon, MD [facility]  07/26/2021  7:45 AM [facility] None  07/26/2021  8:15 AM Ennever, Rudell Cobb, MD [facility] None

## 2021-04-09 NOTE — Patient Instructions (Signed)
-   Increase Jardiance 25 mg daily  - Continue Pioglitazone 15 mg, 1 tablet daily     HOW TO TREAT LOW BLOOD SUGARS (Blood sugar LESS THAN 70 MG/DL) Please follow the RULE OF 15 for the treatment of hypoglycemia treatment (when your (blood sugars are less than 70 mg/dL)   STEP 1: Take 15 grams of carbohydrates when your blood sugar is low, which includes:  3-4 GLUCOSE TABS  OR 3-4 OZ OF JUICE OR REGULAR SODA OR ONE TUBE OF GLUCOSE GEL    STEP 2: RECHECK blood sugar in 15 MINUTES STEP 3: If your blood sugar is still low at the 15 minute recheck --> then, go back to STEP 1 and treat AGAIN with another 15 grams of carbohydrates.

## 2021-04-12 DIAGNOSIS — K644 Residual hemorrhoidal skin tags: Secondary | ICD-10-CM | POA: Insufficient documentation

## 2021-04-12 HISTORY — DX: Residual hemorrhoidal skin tags: K64.4

## 2021-04-27 ENCOUNTER — Other Ambulatory Visit: Payer: Self-pay | Admitting: Medical

## 2021-05-24 ENCOUNTER — Encounter: Payer: Self-pay | Admitting: Family Medicine

## 2021-05-28 ENCOUNTER — Encounter: Payer: Self-pay | Admitting: Internal Medicine

## 2021-05-28 ENCOUNTER — Ambulatory Visit (HOSPITAL_BASED_OUTPATIENT_CLINIC_OR_DEPARTMENT_OTHER): Payer: 59 | Admitting: Obstetrics & Gynecology

## 2021-05-29 ENCOUNTER — Other Ambulatory Visit (HOSPITAL_BASED_OUTPATIENT_CLINIC_OR_DEPARTMENT_OTHER): Payer: Self-pay

## 2021-05-29 ENCOUNTER — Other Ambulatory Visit: Payer: Self-pay

## 2021-05-29 ENCOUNTER — Ambulatory Visit (HOSPITAL_BASED_OUTPATIENT_CLINIC_OR_DEPARTMENT_OTHER): Payer: 59 | Admitting: Obstetrics & Gynecology

## 2021-05-29 ENCOUNTER — Ambulatory Visit (INDEPENDENT_AMBULATORY_CARE_PROVIDER_SITE_OTHER): Payer: 59 | Admitting: Obstetrics & Gynecology

## 2021-05-29 ENCOUNTER — Ambulatory Visit (INDEPENDENT_AMBULATORY_CARE_PROVIDER_SITE_OTHER): Payer: 59

## 2021-05-29 ENCOUNTER — Other Ambulatory Visit: Payer: Self-pay | Admitting: Medical

## 2021-05-29 ENCOUNTER — Encounter (HOSPITAL_BASED_OUTPATIENT_CLINIC_OR_DEPARTMENT_OTHER): Payer: Self-pay

## 2021-05-29 ENCOUNTER — Encounter (HOSPITAL_BASED_OUTPATIENT_CLINIC_OR_DEPARTMENT_OTHER): Payer: Self-pay | Admitting: Obstetrics & Gynecology

## 2021-05-29 VITALS — BP 121/76 | HR 77 | Ht 64.5 in | Wt 159.2 lb

## 2021-05-29 DIAGNOSIS — Z01419 Encounter for gynecological examination (general) (routine) without abnormal findings: Secondary | ICD-10-CM

## 2021-05-29 DIAGNOSIS — D271 Benign neoplasm of left ovary: Secondary | ICD-10-CM | POA: Diagnosis not present

## 2021-05-29 DIAGNOSIS — N950 Postmenopausal bleeding: Secondary | ICD-10-CM

## 2021-05-29 DIAGNOSIS — Z9889 Other specified postprocedural states: Secondary | ICD-10-CM | POA: Diagnosis not present

## 2021-05-29 DIAGNOSIS — Z9189 Other specified personal risk factors, not elsewhere classified: Secondary | ICD-10-CM

## 2021-05-29 DIAGNOSIS — Z803 Family history of malignant neoplasm of breast: Secondary | ICD-10-CM

## 2021-05-29 DIAGNOSIS — E119 Type 2 diabetes mellitus without complications: Secondary | ICD-10-CM

## 2021-05-29 MED ORDER — ONETOUCH ULTRASOFT LANCETS MISC: 12 refills | Status: DC

## 2021-05-29 MED ORDER — ONETOUCH VERIO VI STRP: ORAL_STRIP | 11 refills | Status: DC

## 2021-05-29 MED ORDER — PNEUMOCOCCAL VAC POLYVALENT 25 MCG/0.5ML IJ INJ
INJECTION | INTRAMUSCULAR | 0 refills | Status: DC
  Filled 2021-05-29: qty 0.5, 1d supply, fill #0

## 2021-05-29 NOTE — Telephone Encounter (Signed)
Script sent  

## 2021-05-29 NOTE — Progress Notes (Addendum)
50 y.o. G45P0100 Married White or Caucasian female here for annual exam.  Doing well.  Just got back from [place].  Did have a a day or two of spotting this past year.    Patient's last menstrual period was 04/27/2017.          Sexually active: Yes.    The current method of family planning is post menopausal status.    Smoker:  no  Health Maintenance: Pap:  05/25/2020 Negative History of abnormal Pap:  remote hx MMG:  02/04/2021 Additional images requested for 6 months Colonoscopy:  10/24/2020, polypoid mucosa.  F/u 5 years BMD:   05/31/2020 Screening Labs: 11/2020   reports that she has never smoked. She has never used smokeless tobacco. She reports current alcohol use of about 1.0 standard drink per week. She reports that she does not use drugs.  Past Medical History:  Diagnosis Date   Abnormal Pap smear of cervix    h/o colposcopy/biopsy   Allergy    Asthma    HX   Breast cancer ([place]) 2007   Cancer [facility]) [phone]   ovarian   Diabetes mellitus without complication ([place]) 50/5397    diagnosed by PCP, [name]    GERD (gastroesophageal reflux disease)    Hyperlipemia    on medication    PONV (postoperative nausea and vomiting)     Past Surgical History:  Procedure Laterality Date   APPENDECTOMY     age [age]   BREAST EXCISIONAL BIOPSY Left 08/2019   BREAST LUMPECTOMY Left 2008   left breast   CHOLECYSTECTOMY  2000   ENDOMETRIAL ABLATION  2009   LAPAROSCOPIC SALPINGO OOPHERECTOMY Left 1996   with right tubal ligation   RADIOACTIVE SEED GUIDED EXCISIONAL BREAST BIOPSY Left 09/13/2019   Procedure: RADIOACTIVE SEED GUIDED EXCISIONAL LEFT BREAST BIOPSY;  Surgeon: [name], MD;  Location: [facility];  Service: General;  Laterality: Left;   TUBAL LIGATION      Current Outpatient Medications  Medication Sig Dispense Refill   empagliflozin (JARDIANCE) 25 MG TABS tablet Take 1 tablet (25 mg total) by mouth daily before breakfast. 90 tablet 2    ezetimibe (ZETIA) 10 MG tablet Take 1 tablet (10 mg total) by mouth daily. 90 tablet 3   fluticasone (FLONASE) 50 MCG/ACT nasal spray Place 2 sprays into both nostrils daily. 16 g 1   gabapentin (NEURONTIN) 300 MG capsule Take 1 capsule (300 mg total) by mouth at bedtime. 90 capsule 4   Lancets (ONETOUCH ULTRASOFT) lancets Check blood sugar daily as directed. 100 each 12   letrozole (FEMARA) 2.5 MG tablet Take 1 tablet (2.5 mg total) by mouth daily. 30 tablet 6   metoprolol tartrate (LOPRESSOR) 25 MG tablet Take 1 tablet (25 mg total) by mouth 2 (two) times daily. 180 tablet 3   omeprazole (PRILOSEC) 20 MG capsule TAKE ONE CAPSULE BY MOUTH ONE TIME DAILY 90 capsule 3   ondansetron (ZOFRAN-ODT) 8 MG disintegrating tablet DISSOLVE ONE TABLET BY MOUTH EVERY 8 HOURS AS NEEDED FOR NAUSEA AND VOMITING 20 tablet 0   ONETOUCH VERIO test strip USE TO TEST ONE TIME DAILY AS DIRECTED 100 strip 11   pioglitazone (ACTOS) 15 MG tablet Take 1 tablet (15 mg total) by mouth daily. 90 tablet 2   rosuvastatin (CRESTOR) 5 MG tablet Take 1 tablet (5 mg total) by mouth as directed. ONce weekly 30 tablet 3   vitamin C (ASCORBIC ACID) 500 MG tablet Take 500 mg by mouth daily.  No current facility-administered medications for this visit.    Family History  Problem Relation Age of Onset   Diabetes Mother    Colon cancer Father    Breast cancer Maternal Aunt    Ovarian cancer Maternal Aunt    Ovarian cancer Cousin    Colon cancer Cousin    Melanoma Cousin    Breast cancer Maternal Grandmother    Cancer Other        breast and ovarian-maternal side    Review of Systems  All other systems reviewed and are negative.  Exam:   BP 121/76 (BP Location: Left Arm, Patient Position: Sitting, Cuff Size: Large)   Pulse 77   Ht 5' 4.5" (1.638 m) Comment: reported  Wt 159 lb 3.2 oz (72.2 kg) Comment: reported  LMP 04/27/2017 Comment: spotting  BMI 26.90 kg/m   Height: 5' 4.5" (163.8 cm) (reported)  General  appearance: alert, cooperative and appears stated age Head: Normocephalic, without obvious abnormality, atraumatic Neck: no adenopathy, supple, symmetrical, trachea midline and thyroid normal to inspection and palpation Lungs: clear to auscultation bilaterally Breasts: normal appearance, no masses or tenderness Heart: regular rate and rhythm Abdomen: soft, non-tender; bowel sounds normal; no masses,  no organomegaly Extremities: extremities normal, atraumatic, no cyanosis or edema Skin: Skin color, texture, turgor normal. No rashes or lesions Lymph nodes: Cervical, supraclavicular, and axillary nodes normal. No abnormal inguinal nodes palpated Neurologic: Grossly normal   Pelvic: External genitalia:  no lesions              Urethra:  normal appearing urethra with no masses, tenderness or lesions              Bartholins and Skenes: normal                 Vagina: normal appearing vagina with normal color and no discharge, no lesions              Cervix: no lesions              Pap taken: No. Bimanual Exam:  Uterus:  normal size, contour, position, consistency, mobility, non-tender              Adnexa: normal adnexa and no mass, fullness, tenderness               Rectovaginal: Confirms               Anus:  normal sphincter tone, no lesions  Chaperone, Octaviano Batty, CMA, was present for exam.  Assessment/Plan: 1. Well woman exam with routine gynecological exam - pap neg with neg HR HPV 2021 - MMG already done this year.  MRI will be ordered - colonoscopy 2022 - Plan BMD closer to age 42 - vaccines reviewed/updated  2. Postmenopausal bleeding - US PELVIS TRANSVAGINAL NON-OB (TV ONLY); Future  3. Family history of breast cancer - MR BREAST LEFT W WO CONTRAST INC CAD; Future  4. Increased risk of breast cancer  5. Type 2 diabetes mellitus without complication, without long-term current use of insulin (HCC) - followed by Jodi Mourning and Dr. Kelton Pillar  6. History of endometrial  ablation - continues to have small amount of irregular bleeding.  She will return for PSu  7. Sertoli-Leydig cell tumor of left ovary - 1996 (no yearly PUS recommended, has normal ca-125 post op)  Addendum:  pt returned for utlrasound.  Endometrium was <48mm.  Right ovary small, atrophic.  Left ovary absent.  Uterus normal.  D/w pt  continuing to monitor for now.  Advised to call with additional spotting/bleeding.  Ultrasound results: Uterus: 4.2 x 2.1 x 3.1cm.  Volume: 15.51ml.  Retroflexed.  No abnormalities noted. Endometrial thickness:  3.18mm Right ovary:  2.6 x 1.4 x 1.0.  Volume:  2.73ml.  Atrophic Left ovary:  Surgically absent. Other findings:  No abnormal free fluid in PCDS.

## 2021-05-29 NOTE — Patient Instructions (Signed)
Probably need pneumovax again.  Please let me know if/when you get it.

## 2021-06-10 ENCOUNTER — Ambulatory Visit: Payer: 59 | Admitting: Family Medicine

## 2021-06-10 ENCOUNTER — Other Ambulatory Visit: Payer: Self-pay | Admitting: Obstetrics & Gynecology

## 2021-06-10 ENCOUNTER — Other Ambulatory Visit: Payer: Self-pay

## 2021-06-10 VITALS — BP 122/70 | HR 79 | Temp 97.9°F | Resp 18 | Ht 64.5 in | Wt 159.8 lb

## 2021-06-10 DIAGNOSIS — E119 Type 2 diabetes mellitus without complications: Secondary | ICD-10-CM

## 2021-06-10 DIAGNOSIS — N3000 Acute cystitis without hematuria: Secondary | ICD-10-CM

## 2021-06-10 DIAGNOSIS — R300 Dysuria: Secondary | ICD-10-CM | POA: Diagnosis not present

## 2021-06-10 LAB — POCT URINALYSIS DIPSTICK
Bilirubin, UA: NEGATIVE
Blood, UA: NEGATIVE
Glucose, UA: POSITIVE — AB
Ketones, UA: NEGATIVE
Leukocytes, UA: NEGATIVE
Nitrite, UA: NEGATIVE
Protein, UA: NEGATIVE
Spec Grav, UA: <=1.005 — AB (ref 1.010–1.025)
Urobilinogen, UA: 0.2 E.U./dL
pH, UA: 7 (ref 5.0–8.0)

## 2021-06-10 MED ORDER — AMOXICILLIN-POT CLAVULANATE 500-125 MG PO TABS
1.0000 | ORAL_TABLET | Freq: Two times a day (BID) | ORAL | 0 refills | Status: DC
Start: 2021-06-10 — End: 2021-06-25

## 2021-06-10 MED ORDER — CEFTRIAXONE SODIUM 1 G IJ SOLR
1.0000 g | Freq: Once | INTRAMUSCULAR | Status: AC
  Administered 2021-06-10: 1 g via INTRAMUSCULAR

## 2021-06-10 NOTE — Progress Notes (Addendum)
[place] at [facility] [address] [place], [place] [address] [phone]  Date:  06/10/2021   Name:  Carolyn Wilson   DOB:  14-May-1971   MRN:  [id number]  PCP:  Marrian Salvage, FNP    Chief Complaint: possible UTI (Sxs started Saturday. Frequency, burning and pressure. Pt is a diabetic. Taking AZO.)   History of Present Illness:  Carolyn Wilson is a 50 y.o. very pleasant female patient who presents with the following:  Pt seen today with concern of UTI- pt of my partner Jodi Mourning I have not seen her myself in the past History of DM-currently she is taking Actos 15 mg and Jardiance 25 mg  Lab Results  Component Value Date   HGBA1C 7.6 (A) 04/09/2021   Today is Monday- on Saturday she awoke with urinary frequency She has never had a urinary tract infection before work so did not immediately recognize the symptoms She did a virtual visit and was told to come in for an office visit She is still having frequency Mild dysuria  No hematuria that she has noted but she did see some mucus in her urine yesterday No significant fever or chills- she did have a temp of about 99 last night however  She is taking azo which helps perhaps a little bit She is vomiting but she does at baseline due to her jardiance  She started using jardiance about 2 months ago and though she is having a lot of difficulty tolerating it.  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 08/13/2014   Abnormal Pap smear of cervix  remote Leep  1996 per pt 08/13/2014   Allergic rhinitis 08/13/2014   S/P endometrial ablation 08/13/2014   GERD (gastroesophageal reflux disease) 08/13/2014   Diarrhea post cholecystectomy since 1999 08/13/2014   FHx: colon cancer  father  08/13/2014   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    GERD (gastroesophageal reflux disease)    Hyperlipemia    on medication    PONV (postoperative nausea and vomiting)     Past Surgical History:  Procedure Laterality Date   APPENDECTOMY     age [age]   BREAST EXCISIONAL BIOPSY Left 08/2019   BREAST LUMPECTOMY Left 2008   left breast   CHOLECYSTECTOMY  2000   ENDOMETRIAL ABLATION  2009   LAPAROSCOPIC SALPINGO OOPHERECTOMY Left 1996   with right  tubal ligation   RADIOACTIVE SEED GUIDED EXCISIONAL BREAST BIOPSY Left 09/13/2019   Procedure: RADIOACTIVE SEED GUIDED EXCISIONAL LEFT BREAST BIOPSY;  Surgeon: [name], MD;  Location: [place];  Service: General;  Laterality: Left;   TUBAL LIGATION      Social History   Tobacco Use   Smoking status: Never   Smokeless tobacco: Never   Tobacco comments:    NEVER USED TOBACCO  Vaping Use   Vaping Use: Never used  Substance Use Topics   Alcohol use: Yes    Alcohol/week: 1.0 standard drink    Types: 1 Standard drinks or equivalent per week   Drug use: No    Family History  Problem Relation Age of Onset   Diabetes Mother     Colon cancer Father    Breast cancer Maternal Aunt    Ovarian cancer Maternal Aunt    Ovarian cancer Cousin    Colon cancer Cousin    Melanoma Cousin    Breast cancer Maternal Grandmother    Cancer Other        breast and ovarian-maternal side    Allergies  Allergen Reactions   Latex Rash    Causes blisters   Lidocaine Nausea And Vomiting   Peanut-Containing Drug Products Anaphylaxis   Sulfa Antibiotics Shortness Of Breath and Rash   Reglan [Metoclopramide] Tinitus   Metformin And Related Diarrhea   Adhesive [Tape] Rash   Ciprofloxacin Rash   Elemental Sulfur Rash    Medication list has been reviewed and updated.  Current Outpatient Medications on File Prior to Visit  Medication Sig Dispense Refill   empagliflozin (JARDIANCE) 25 MG TABS tablet Take 1 tablet (25 mg total) by mouth daily before breakfast. 90 tablet 2   ezetimibe (ZETIA) 10 MG tablet Take 1 tablet (10 mg total) by mouth daily. 90 tablet 3   fluticasone (FLONASE) 50 MCG/ACT nasal spray USE TWO SPRAYS IN EACH NOSTRIL ONE TIME DAILY 16 mL 1   gabapentin (NEURONTIN) 300 MG capsule TAKE ONE CAPSULE BY MOUTH ONE TIME DAILY AT BEDTIME 90 capsule 4   glucose blood (ONETOUCH VERIO) test strip USE TO TEST ONE TIME DAILY AS DIRECTED 100 strip 11   Lancets (ONETOUCH ULTRASOFT) lancets Check blood sugar daily as directed. 100 each 12   letrozole (FEMARA) 2.5 MG tablet Take 1 tablet (2.5 mg total) by mouth daily. 30 tablet 6   metoprolol tartrate (LOPRESSOR) 25 MG tablet Take 1 tablet (25 mg total) by mouth 2 (two) times daily. 180 tablet 3   omeprazole (PRILOSEC) 20 MG capsule TAKE ONE CAPSULE BY MOUTH ONE TIME DAILY 90 capsule 3   ondansetron (ZOFRAN-ODT) 8 MG disintegrating tablet DISSOLVE ONE TABLET BY MOUTH EVERY 8 HOURS AS NEEDED FOR NAUSEA AND VOMITING 20 tablet 0   pioglitazone (ACTOS) 15 MG tablet Take 1 tablet (15 mg total) by mouth daily. 90 tablet 2   pneumococcal 23 valent vaccine (PNEUMOVAX-23) 25 MCG/0.5ML  injection Inject into the muscle 0.5 mL 0   rosuvastatin (CRESTOR) 5 MG tablet Take 1 tablet (5 mg total) by mouth as directed. ONce weekly 30 tablet 3   vitamin C (ASCORBIC ACID) 500 MG tablet Take 500 mg by mouth daily.     No current facility-administered medications on file prior to visit.    Review of Systems:  As per HPI- otherwise negative.   Physical Examination: Vitals:   06/10/21 1405  BP: 122/70  Pulse: 79  Resp: 18  Temp: 97.9 F (36.6 C)  SpO2: 97%   Vitals:   06/10/21 1405  Weight: 159 lb 12.8 oz (72.5 kg)  Height: 5' 4.5" (1.638 m)   Body mass index is 27.01 kg/m. Ideal Body Weight: Weight in (lb) to have BMI = 25: 147.6  GEN: no acute distress.  Mild overweight, generally looks well HEENT: Atraumatic, Normocephalic.  Ears and Nose: No external deformity. CV: RRR, No M/G/R. No JVD. No thrill. No extra heart sounds. PULM: CTA B, no wheezes, crackles, rhonchi. No retractions. No resp. distress. No accessory muscle use. ABD: S, NT, ND, +BS. No rebound. No HSM. EXTR: No c/c/e PSYCH: Normally interactive. Conversant.  Belly is benign She has perhaps trace CVA tenderness with percussion Pelvic exam performed, mild changes of vaginal atrophy are noted but otherwise normal  Results for orders placed or performed in visit on 06/10/21  POCT Urinalysis Dipstick  Result Value Ref Range   Color, UA yellow    Clarity, UA cloudy    Glucose, UA Positive (A) Negative   Bilirubin, UA negative    Ketones, UA negative    Spec Grav, UA <=1.005 (A) 1.010 - 1.025   Blood, UA negative    pH, UA 7.0 5.0 - 8.0   Protein, UA Negative Negative   Urobilinogen, UA 0.2 0.2 or 1.0 E.U./dL   Nitrite, UA negative    Leukocytes, UA Negative Negative   Appearance cloudy    Odor none    Patient notes her blood sugar this morning was approximately 150-glucose in urine is to be expected since she is taking an SGLT2  Assessment and Plan: Burning with urination - Plan: POCT  Urinalysis Dipstick, Urine Culture, amoxicillin-clavulanate (AUGMENTIN) 500-125 MG tablet, cefTRIAXone (ROCEPHIN) injection 1 g  Acute cystitis without hematuria - Plan: amoxicillin-clavulanate (AUGMENTIN) 500-125 MG tablet, cefTRIAXone (ROCEPHIN) injection 1 g  Type 2 diabetes mellitus without complication, without long-term current use of insulin ([place])  Patient seen today with likely urinary tract infection, she is also noted some back pain and possible low-grade fever which may signify early pyelonephritis.  We will treat with a gram of Rocephin and started on Augmentin -she is allergic to Cipro and also sulfa drugs  Urine culture is pending, I advised her to let me know if she is not improving rapidly  She is not able to tolerate Jardiance.  Advised her to stop this medication and increase her Actos to 30 mg.  She will plan to schedule with her PCP Jodi Mourning, they may have her start on a GLP-1; sulfa allergy and metformin tolerance limits other therapy  Signed Lamar Blinks, MD  Addendum 11/23, received her urine culture as below.  Message to patient  Results for orders placed or performed in visit on 06/10/21  Urine Culture   Specimen: Urine  Result Value Ref Range   MICRO NUMBER: [id number]    SPECIMEN QUALITY: Adequate    Sample Source NOT GIVEN    STATUS: FINAL    ISOLATE 1:      Less than 10,000 CFU/mL of single Gram positive organism isolated. No further testing will be performed. If clinically indicated, recollection using a method to minimize contamination, with prompt transfer to Urine Culture Transport Tube, is recommended.  POCT Urinalysis Dipstick  Result Value Ref Range   Color, UA yellow    Clarity, UA cloudy    Glucose, UA Positive (A) Negative   Bilirubin, UA negative    Ketones, UA negative    Spec Grav, UA <=1.005 (A) 1.010 - 1.025  Blood, UA negative    pH, UA 7.0 5.0 - 8.0   Protein, UA Negative Negative   Urobilinogen, UA 0.2 0.2 or 1.0 E.U./dL    Nitrite, UA negative    Leukocytes, UA Negative Negative   Appearance cloudy    Odor none

## 2021-06-10 NOTE — Patient Instructions (Addendum)
Good to see you today- we will be in touch with your urine culture asap Today you got a shot of antibiotics, and we will start an oral antibiotic as well  Please let me know if you are not improving in the next 36 hours or so- Sooner if worse.   If you would like, ok to stop Jardiance and double up on your Actos- for a total dose of 30 mg daily      Please make an appt to see Mickel Baas soon to discuss diabetes management going forward

## 2021-06-11 ENCOUNTER — Other Ambulatory Visit (HOSPITAL_BASED_OUTPATIENT_CLINIC_OR_DEPARTMENT_OTHER): Payer: Self-pay | Admitting: Obstetrics & Gynecology

## 2021-06-11 ENCOUNTER — Encounter (HOSPITAL_BASED_OUTPATIENT_CLINIC_OR_DEPARTMENT_OTHER): Payer: Self-pay | Admitting: Obstetrics & Gynecology

## 2021-06-11 ENCOUNTER — Other Ambulatory Visit: Payer: 59

## 2021-06-11 LAB — URINE CULTURE
MICRO NUMBER:: 12663918
SPECIMEN QUALITY:: ADEQUATE

## 2021-06-11 MED ORDER — NONFORMULARY OR COMPOUNDED ITEM: 3 refills | Status: DC

## 2021-06-12 ENCOUNTER — Encounter: Payer: Self-pay | Admitting: Family Medicine

## 2021-06-18 MED ORDER — FLUCONAZOLE 150 MG PO TABS: 150.0000 mg | ORAL_TABLET | Freq: Once | ORAL | 0 refills | Status: AC

## 2021-06-25 ENCOUNTER — Encounter: Payer: Self-pay | Admitting: Family

## 2021-06-25 ENCOUNTER — Ambulatory Visit: Payer: 59 | Admitting: Family

## 2021-06-25 VITALS — BP 110/80 | HR 74 | Temp 97.6°F | Ht 64.0 in | Wt 159.9 lb

## 2021-06-25 DIAGNOSIS — E119 Type 2 diabetes mellitus without complications: Secondary | ICD-10-CM | POA: Diagnosis not present

## 2021-06-25 LAB — COMPREHENSIVE METABOLIC PANEL
ALT: 25 U/L (ref 0–35)
AST: 22 U/L (ref 0–37)
Albumin: 4.3 g/dL (ref 3.5–5.2)
Alkaline Phosphatase: 100 U/L (ref 39–117)
BUN: 11 mg/dL (ref 6–23)
CO2: 32 mEq/L (ref 19–32)
Calcium: 10.4 mg/dL (ref 8.4–10.5)
Chloride: 100 mEq/L (ref 96–112)
Creatinine, Ser: 0.81 mg/dL (ref 0.40–1.20)
GFR: 84.88 mL/min (ref >60.00)
Glucose, Bld: 150 mg/dL — ABNORMAL HIGH (ref 70–99)
Potassium: 4.7 mEq/L (ref 3.5–5.1)
Sodium: 140 mEq/L (ref 135–145)
Total Bilirubin: 0.5 mg/dL (ref 0.2–1.2)
Total Protein: 6.9 g/dL (ref 6.0–8.3)

## 2021-06-25 LAB — HEMOGLOBIN A1C: Hgb A1c MFr Bld: 8.5 % — ABNORMAL HIGH (ref 4.6–6.5)

## 2021-06-25 MED ORDER — TRULICITY 0.75 MG/0.5ML ~~LOC~~ SOAJ: 0.7500 mg | SUBCUTANEOUS | 1 refills | Status: DC

## 2021-06-25 NOTE — Progress Notes (Signed)
Carolyn Wilson is a 50 y.o. female with the following history as recorded in EpicCare:  Patient Active Problem List   Diagnosis Date Noted   Anal skin tag 04/12/2021   Fascial hernia 11/29/2020   Wrist sprain, left, subsequent encounter 08/14/2020   Dyslipidemia 07/31/2020   Type 2 diabetes mellitus without complication, without long-term current use of insulin ([place]) 07/31/2020   Tachycardia 08/05/2017   Right shoulder pain 12/18/2016   Wellness examination 11/21/2015   Diabetes ([place]) 09/04/2015   Allergy with anaphylaxis due to peanuts 08/13/2014   Abnormal Pap smear of cervix  remote Leep  1996 per pt 08/13/2014   Allergic rhinitis 08/13/2014   S/P endometrial ablation 08/13/2014   GERD (gastroesophageal reflux disease) 08/13/2014   Diarrhea post cholecystectomy since 1999 08/13/2014   FHx: colon cancer  father  08/13/2014   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 2 mL 1   ezetimibe (ZETIA) 10 MG tablet Take 1 tablet (10 mg total) by mouth daily. 90 tablet 3   gabapentin (NEURONTIN) 300 MG capsule TAKE ONE CAPSULE BY MOUTH ONE TIME DAILY AT BEDTIME 90 capsule 4   glucose blood (ONETOUCH VERIO) test strip USE TO TEST ONE TIME DAILY AS DIRECTED 100 strip 11   Lancets (ONETOUCH ULTRASOFT) lancets Check blood sugar daily as directed. 100 each 12   letrozole (FEMARA) 2.5 MG tablet Take 1 tablet (2.5 mg total) by mouth daily. 30 tablet 6   metoprolol tartrate (LOPRESSOR) 25 MG tablet Take 1 tablet (25 mg total) by mouth 2 (two) times daily. 180 tablet 3   NONFORMULARY OR COMPOUNDED ITEM Vitamin E vaginal suppositories 200u/ml.  One pv three times weekly. 36 each 3   omeprazole (PRILOSEC)  20 MG capsule TAKE ONE CAPSULE BY MOUTH ONE TIME DAILY 90 capsule 3   ondansetron (ZOFRAN-ODT) 8 MG disintegrating tablet DISSOLVE ONE TABLET BY MOUTH EVERY 8 HOURS AS NEEDED FOR NAUSEA AND VOMITING 20 tablet 0   pioglitazone (ACTOS) 15 MG tablet Take 1 tablet (15 mg total) by mouth daily. 90 tablet 2   vitamin C (ASCORBIC ACID) 500 MG tablet Take 500 mg by mouth daily.     empagliflozin (JARDIANCE) 25 MG TABS tablet Take 1 tablet (25 mg total) by mouth daily before breakfast. (Patient not taking: Reported on 06/25/2021) 90 tablet 2   No current facility-administered medications for this visit.    Allergies: Latex, Lidocaine, Peanut-containing drug products, Sulfa antibiotics, Reglan [metoclopramide], Metformin and related, Adhesive [tape], Ciprofloxacin, and Elemental sulfur  Past Medical History:  Diagnosis Date   Abnormal Pap smear of cervix    h/o colposcopy/biopsy   Allergy    Asthma    HX   Breast cancer ([place]) 2007   Cancer [facility]) [phone]   ovarian   Diabetes mellitus without complication ([place]) 66/2947    diagnosed by PCP, [name]    GERD (gastroesophageal reflux disease)    Hyperlipemia    on medication    PONV (postoperative nausea and vomiting)     Past Surgical History:  Procedure Laterality Date   APPENDECTOMY     age [age]   BREAST EXCISIONAL BIOPSY Left 08/2019   BREAST LUMPECTOMY Left 2008   left breast  CHOLECYSTECTOMY  2000   ENDOMETRIAL ABLATION  2009   LAPAROSCOPIC SALPINGO OOPHERECTOMY Left 1996   with right tubal ligation   RADIOACTIVE SEED GUIDED EXCISIONAL BREAST BIOPSY Left 09/13/2019   Procedure: RADIOACTIVE SEED GUIDED EXCISIONAL LEFT BREAST BIOPSY;  Surgeon: [name], MD;  Location: [place];  Service: General;  Laterality: Left;   TUBAL LIGATION      Family History  Problem Relation Age of Onset   Diabetes Mother    Colon cancer Father    Breast cancer Maternal Aunt    Ovarian cancer Maternal Aunt    Ovarian cancer  Cousin    Colon cancer Cousin    Melanoma Cousin    Breast cancer Maternal Grandmother    Cancer Other        breast and ovarian-maternal side    Social History   Tobacco Use   Smoking status: Never   Smokeless tobacco: Never   Tobacco comments:    NEVER USED TOBACCO  Substance Use Topics   Alcohol use: Yes    Alcohol/week: 1.0 standard drink    Types: 1 Standard drinks or equivalent per week    Subjective:   Follow up to discuss options for management of Type 2 Diabetes; Cannot tolerate oral Metformin, oral Rybelsus; had horrible yeast infection/ UTI on Jardiance; Is currently only taking Actos 30 mg daily; notes that blood sugar has been elevated recently; fasting blood sugar is up to 180; Has tolerated Onglyza in the past but this was changed by her endocrinologist;     Objective:  Vitals:   06/25/21 1029  BP: 110/80  Pulse: 74  Temp: 97.6 F (36.4 C)  TempSrc: Oral  SpO2: 99%  Weight: 159 lb 14.4 oz (72.5 kg)  Height: _0  (1.626 m)    General: Well developed, well nourished, in no acute distress  Skin : Warm and dry.  Head: Normocephalic and atraumatic  Lungs: Respirations unlabored; clear to auscultation bilaterally without wheeze, rales, rhonchi  CVS exam: normal rate and regular rhythm.  Neurologic: Alert and oriented; speech intact; face symmetrical; moves all extremities well; CNII-XII intact without focal deficit   Assessment:  1. Type 2 diabetes mellitus without complication, without long-term current use of insulin ([place])     Plan:  Update labs today; lower Actos back 15 mg daily; will try injectable GLP-1; she could not tolerate Rybelsus so will not use Ozempic; she understands that she could have similar reaction on injectable as she did to Rybelsus and to let me know if she does; will try Trulicity for different drug option though; follow up in early February, sooner prn.   This visit occurred during the SARS-CoV-2 public health emergency.  Safety  protocols were in place, including screening questions prior to the visit, additional usage of staff PPE, and extensive cleaning of exam room while observing appropriate contact time as indicated for disinfecting solutions.    Return in about 6 weeks (around 08/06/2021).  Orders Placed This Encounter  Procedures   Comp Met (CMET)   Hemoglobin A1c    Requested Prescriptions   Signed Prescriptions Disp Refills   Dulaglutide (TRULICITY) 7.42 VZ/5.6LO SOPN 2 mL 1    Sig: Inject 0.75 mg into the skin once a week.

## 2021-06-29 ENCOUNTER — Ambulatory Visit
Admission: RE | Admit: 2021-06-29 | Discharge: 2021-06-29 | Disposition: A | Discharge status: Home / Self Care | Payer: 59 | Source: Ambulatory Visit | Attending: Obstetrics & Gynecology | Admitting: Obstetrics & Gynecology

## 2021-06-29 ENCOUNTER — Other Ambulatory Visit: Payer: Self-pay

## 2021-06-29 DIAGNOSIS — Z9189 Other specified personal risk factors, not elsewhere classified: Secondary | ICD-10-CM

## 2021-06-29 DIAGNOSIS — Z803 Family history of malignant neoplasm of breast: Secondary | ICD-10-CM

## 2021-06-29 MED ORDER — GADOBUTROL 1 MMOL/ML IV SOLN
10.0000 mL | Freq: Once | INTRAVENOUS | Status: AC | PRN
  Administered 2021-06-29: 10 mL via INTRAVENOUS

## 2021-07-26 ENCOUNTER — Encounter: Payer: Self-pay | Admitting: Hematology & Oncology

## 2021-07-26 ENCOUNTER — Other Ambulatory Visit: Payer: Self-pay | Admitting: *Deleted

## 2021-07-26 ENCOUNTER — Inpatient Hospital Stay: Payer: 59 | Attending: Hematology & Oncology

## 2021-07-26 ENCOUNTER — Telehealth: Payer: Self-pay | Admitting: *Deleted

## 2021-07-26 ENCOUNTER — Other Ambulatory Visit: Payer: Self-pay

## 2021-07-26 ENCOUNTER — Inpatient Hospital Stay (HOSPITAL_BASED_OUTPATIENT_CLINIC_OR_DEPARTMENT_OTHER): Payer: 59 | Admitting: Hematology & Oncology

## 2021-07-26 VITALS — BP 103/67 | HR 82 | Temp 98.0°F | Resp 16 | Wt 159.0 lb

## 2021-07-26 DIAGNOSIS — E119 Type 2 diabetes mellitus without complications: Secondary | ICD-10-CM | POA: Insufficient documentation

## 2021-07-26 DIAGNOSIS — N62 Hypertrophy of breast: Secondary | ICD-10-CM | POA: Insufficient documentation

## 2021-07-26 DIAGNOSIS — D270 Benign neoplasm of right ovary: Secondary | ICD-10-CM

## 2021-07-26 DIAGNOSIS — Z79899 Other long term (current) drug therapy: Secondary | ICD-10-CM | POA: Diagnosis not present

## 2021-07-26 DIAGNOSIS — Z8041 Family history of malignant neoplasm of ovary: Secondary | ICD-10-CM | POA: Diagnosis not present

## 2021-07-26 DIAGNOSIS — Z8543 Personal history of malignant neoplasm of ovary: Secondary | ICD-10-CM | POA: Diagnosis not present

## 2021-07-26 LAB — CBC WITH DIFFERENTIAL (CANCER CENTER ONLY)
Abs Immature Granulocytes: 0.02 10*3/uL (ref 0.00–0.07)
Basophils Absolute: 0 10*3/uL (ref 0.0–0.1)
Basophils Relative: 1 %
Eosinophils Absolute: 0.2 10*3/uL (ref 0.0–0.5)
Eosinophils Relative: 2 %
HCT: 43.1 % (ref 36.0–46.0)
Hemoglobin: 14.4 g/dL (ref 12.0–15.0)
Immature Granulocytes: 0 %
Lymphocytes Relative: 39 %
Lymphs Abs: 2.9 10*3/uL (ref 0.7–4.0)
MCH: 30 pg (ref 26.0–34.0)
MCHC: 33.4 g/dL (ref 30.0–36.0)
MCV: 89.8 fL (ref 80.0–100.0)
Monocytes Absolute: 0.5 10*3/uL (ref 0.1–1.0)
Monocytes Relative: 7 %
Neutro Abs: 3.7 10*3/uL (ref 1.7–7.7)
Neutrophils Relative %: 51 %
Platelet Count: 295 10*3/uL (ref 150–400)
RBC: 4.8 MIL/uL (ref 3.87–5.11)
RDW: 12.3 % (ref 11.5–15.5)
WBC Count: 7.4 10*3/uL (ref 4.0–10.5)
nRBC: 0 % (ref 0.0–0.2)

## 2021-07-26 LAB — CMP (CANCER CENTER ONLY)
ALT: 20 U/L (ref 0–44)
AST: 18 U/L (ref 15–41)
Albumin: 4.3 g/dL (ref 3.5–5.0)
Alkaline Phosphatase: 85 U/L (ref 38–126)
Anion gap: 7 (ref 5–15)
BUN: 10 mg/dL (ref 6–20)
CO2: 31 mmol/L (ref 22–32)
Calcium: 9.9 mg/dL (ref 8.9–10.3)
Chloride: 103 mmol/L (ref 98–111)
Creatinine: 0.95 mg/dL (ref 0.44–1.00)
GFR, Estimated: >60 mL/min (ref >60)
Glucose, Bld: 130 mg/dL — ABNORMAL HIGH (ref 70–99)
Potassium: 4.2 mmol/L (ref 3.5–5.1)
Sodium: 141 mmol/L (ref 135–145)
Total Bilirubin: 0.5 mg/dL (ref 0.3–1.2)
Total Protein: 6.8 g/dL (ref 6.5–8.1)

## 2021-07-26 LAB — VITAMIN D 25 HYDROXY (VIT D DEFICIENCY, FRACTURES): Vit D, 25-Hydroxy: 36.3 ng/mL (ref 30–100)

## 2021-07-26 MED ORDER — LETROZOLE 2.5 MG PO TABS: 2.5000 mg | ORAL_TABLET | Freq: Every day | ORAL | 6 refills | Status: DC

## 2021-07-26 NOTE — Progress Notes (Signed)
Hematology and Oncology Follow Up Visit  Carolyn Wilson [id number] 04-19-1971 51 y.o. 07/26/2021   Principle Diagnosis:  1. History of Sertoli-Leydig tumor of the left ovary  2. Strong family history of breast and ovarian cancer - BRCA 1/2 and p53 negative 3. 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 Complex Sclerosing Lesion with usual ductal hyperplasia of the LEFT breast (central)   Current Therapy:         Femara 2.5 po  q day - start on 10/31/2020   Interim History:  Ms. Laperle is here today for follow-up.  We last saw her back in August.  She did have a nice holiday season although she was sick right before Christmas.  She had a fever.  She did not have COVID.  She did not have Influenza.  She is still working.  She is doing work from home today.  She is quite busy at work.  Her main clinical problem has been the diabetes.  She is on Trulicity now.  Her blood sugar this morning was only 130 which is very good for her.  She has had no problems with bowels or bladder.  She has had no problems with cough or shortness of breath.  She has had no rashes.  There is been no leg swelling.  She has had a little bit of tenderness in the right axilla.  She is on the Femara.  She is doing well on the Femara.  Overall, I would say performance status is ECOG 0.    Medications:  Allergies as of 07/26/2021       Reactions   Latex Rash   Causes blisters   Lidocaine Nausea And Vomiting   Peanut-containing Drug Products Anaphylaxis   Sulfa Antibiotics Shortness Of Breath, Rash   Reglan [metoclopramide] Tinitus   Metformin And Related Diarrhea   Adhesive [tape] Rash   Ciprofloxacin Rash   Elemental Sulfur Rash        Medication List        Accurate as of July 26, 2021  8:20 AM. If you have any questions, ask your nurse or doctor.          STOP taking these medications    empagliflozin 25 MG Tabs tablet Commonly  known as: Jardiance Stopped by: Volanda Napoleon, MD       TAKE these medications    ezetimibe 10 MG tablet Commonly known as: ZETIA Take 1 tablet (10 mg total) by mouth daily.   gabapentin 300 MG capsule Commonly known as: NEURONTIN TAKE ONE CAPSULE BY MOUTH ONE TIME DAILY AT BEDTIME   letrozole 2.5 MG tablet Commonly known as: FEMARA Take 1 tablet (2.5 mg total) by mouth daily.   metoprolol tartrate 25 MG tablet Commonly known as: LOPRESSOR Take 1 tablet (25 mg total) by mouth 2 (two) times daily.   NONFORMULARY OR COMPOUNDED ITEM Vitamin E vaginal suppositories 200u/ml.  One pv three times weekly.   omeprazole 20 MG capsule Commonly known as: PRILOSEC TAKE ONE CAPSULE BY MOUTH ONE TIME DAILY   ondansetron 8 MG disintegrating tablet Commonly known as: ZOFRAN-ODT DISSOLVE ONE TABLET BY MOUTH EVERY 8 HOURS AS NEEDED FOR NAUSEA AND VOMITING   onetouch ultrasoft lancets Check blood sugar daily as directed.   OneTouch Verio test strip Generic drug: glucose blood USE TO TEST ONE TIME DAILY AS DIRECTED   pioglitazone 15 MG tablet Commonly known as: ACTOS Take 1 tablet (15 mg total) by mouth  daily.   Trulicity 1.57 WI/2.0BT Sopn Generic drug: Dulaglutide Inject 0.75 mg into the skin once a week.   vitamin C 500 MG tablet Commonly known as: ASCORBIC ACID Take 500 mg by mouth daily.        Allergies:  Allergies  Allergen Reactions   Latex Rash    Causes blisters   Lidocaine Nausea And Vomiting   Peanut-Containing Drug Products Anaphylaxis   Sulfa Antibiotics Shortness Of Breath and Rash   Reglan [Metoclopramide] Tinitus   Metformin And Related Diarrhea   Adhesive [Tape] Rash   Ciprofloxacin Rash   Elemental Sulfur Rash    Past Medical History, Surgical history, Social history, and Family History were reviewed and updated.  Review of Systems: Review of Systems  Constitutional: Negative.   HENT: Negative.    Eyes: Negative.   Respiratory: Negative.     Cardiovascular: Negative.   Gastrointestinal: Negative.   Genitourinary: Negative.   Musculoskeletal: Negative.   Skin: Negative.   Neurological: Negative.   Endo/Heme/Allergies: Negative.   Psychiatric/Behavioral: Negative.    [name]   Physical Exam:  weight is 159 lb (72.1 kg). Her oral temperature is 98 F (36.7 C). Her blood pressure is 103/67 and her pulse is 82. Her respiration is 16 and oxygen saturation is 98%.   Wt Readings from Last 3 Encounters:  07/26/21 159 lb (72.1 kg)  06/25/21 159 lb 14.4 oz (72.5 kg)  06/10/21 159 lb 12.8 oz (72.5 kg)    Physical Exam Vitals reviewed.  Constitutional:      Comments: Her breast exam shows right breast with no masses, edema or erythema. She has the biopsy site at about the 10 o'clock position on the right breast. There is no erythema or ecchymosis associated with this. There is no right axillary adenopathy.  Left breast shows the lumpectomy scar at the edge of the areola at 3:00.  There is no erythema or warmth.  There is no discharge noted.  There is no tenderness to palpation.  There is no left axillary adenopathy.  HENT:     Head: Normocephalic and atraumatic.  Eyes:     Pupils: Pupils are equal, round, and reactive to light.  Cardiovascular:     Rate and Rhythm: Normal rate and regular rhythm.     Heart sounds: Normal heart sounds.  Pulmonary:     Effort: Pulmonary effort is normal.     Breath sounds: Normal breath sounds.  Abdominal:     General: Bowel sounds are normal.     Palpations: Abdomen is soft.  Musculoskeletal:        General: No tenderness or deformity. Normal range of motion.     Cervical back: Normal range of motion.  Lymphadenopathy:     Cervical: No cervical adenopathy.  Skin:    General: Skin is warm and dry.     Findings: No erythema or rash.  Neurological:     Mental Status: She is alert and oriented to person, place, and time.  Psychiatric:        Behavior: Behavior normal.        Thought Content:  Thought content normal.        Judgment: Judgment normal.     Lab Results  Component Value Date   WBC 7.4 07/26/2021   HGB 14.4 07/26/2021   HCT 43.1 07/26/2021   MCV 89.8 07/26/2021   PLT 295 07/26/2021   Lab Results  Component Value Date   FERRITIN 28.9 01/24/2021   Lab Results  Component Value Date   RBC 4.80 07/26/2021   No results found for: KPAFRELGTCHN, LAMBDASER, KAPLAMBRATIO No results found for: Kandis Cocking, IGMSERUM No results found for: Odetta Pink, SPEI   Chemistry      Component Value Date/Time   NA 141 07/26/2021 0737   NA 142 11/01/2019 0902   NA 144 03/11/2017 0822   K 4.2 07/26/2021 0737   K 4.5 03/11/2017 0822   CL 103 07/26/2021 0737   CL 104 03/11/2017 0822   CO2 31 07/26/2021 0737   CO2 29 03/11/2017 0822   BUN 10 07/26/2021 0737   BUN 14 11/01/2019 0902   BUN 9 03/11/2017 0822   CREATININE 0.95 07/26/2021 0737   CREATININE 0.82 04/05/2020 0732      Component Value Date/Time   CALCIUM 9.9 07/26/2021 0737   CALCIUM 9.2 03/11/2017 0822   ALKPHOS 85 07/26/2021 0737   ALKPHOS 65 03/11/2017 0822   AST 18 07/26/2021 0737   ALT 20 07/26/2021 0737   ALT 25 03/11/2017 0822   BILITOT 0.5 07/26/2021 0737       Impression and Plan: Ms. 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   She was diagnosed with stromal hyperplasia of the right breast and ductal hyperplasia of the left breast in December 2020.    She had a left breast lumpectomy with radioactive seed placement in February 2021.   Now, she has a similar clinical problem in the right breast.  She had a biopsy back in August 2021.    She is doing well on the Femara.  We will keep her on Femara.  I will see any issues with Femara.  I am glad that her diabetes is doing well.  I think we get her back in about 6 months.  Cassell Smiles, MD 1/6/20238:20 AM

## 2021-07-26 NOTE — Telephone Encounter (Signed)
Per 07/26/21 los - gave upcoming appointments - confirmed °

## 2021-08-06 ENCOUNTER — Ambulatory Visit: Payer: 59 | Admitting: Family

## 2021-08-06 VITALS — BP 122/76 | HR 91 | Temp 97.9°F | Resp 18 | Ht 64.0 in | Wt 158.5 lb

## 2021-08-06 DIAGNOSIS — E119 Type 2 diabetes mellitus without complications: Secondary | ICD-10-CM | POA: Diagnosis not present

## 2021-08-06 MED ORDER — SAXAGLIPTIN HCL 5 MG PO TABS: 5.0000 mg | ORAL_TABLET | Freq: Every day | ORAL | 1 refills | Status: DC

## 2021-08-06 MED ORDER — PIOGLITAZONE HCL 30 MG PO TABS: 30.0000 mg | ORAL_TABLET | Freq: Every day | ORAL | 0 refills | Status: DC

## 2021-08-06 NOTE — Progress Notes (Signed)
Carolyn Wilson is a 51 y.o. female with the following history as recorded in EpicCare:  Patient Active Problem List   Diagnosis Date Noted   Anal skin tag 04/12/2021   Fascial hernia 11/29/2020   Wrist sprain, left, subsequent encounter 08/14/2020   Dyslipidemia 07/31/2020   Type 2 diabetes mellitus without complication, without long-term current use of insulin ([place]) 07/31/2020   Tachycardia 08/05/2017   Right shoulder pain 12/18/2016   Wellness examination 11/21/2015   Diabetes ([place]) 09/04/2015   Allergy with anaphylaxis due to peanuts 08/13/2014   Abnormal Pap smear of cervix  remote Leep  1996 per pt 08/13/2014   Allergic rhinitis 08/13/2014   S/P endometrial ablation 08/13/2014   GERD (gastroesophageal reflux disease) 08/13/2014   Diarrhea post cholecystectomy since 1999 08/13/2014   FHx: colon cancer  father  08/13/2014   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 90 tablet 3   gabapentin (NEURONTIN) 300 MG capsule TAKE ONE CAPSULE BY MOUTH ONE TIME DAILY AT BEDTIME 90 capsule 4   glucose blood (ONETOUCH VERIO) test strip USE TO TEST ONE TIME DAILY AS DIRECTED 100 strip 11   Lancets (ONETOUCH ULTRASOFT) lancets Check blood sugar daily as directed. 100 each 12   letrozole (FEMARA) 2.5 MG tablet Take 1 tablet (2.5 mg total) by mouth daily. 30 tablet 6   metoprolol tartrate (LOPRESSOR) 25 MG tablet Take 1 tablet (25 mg total) by mouth 2 (two) times daily. 180 tablet 3   NONFORMULARY OR COMPOUNDED ITEM Vitamin E vaginal suppositories 200u/ml.  One pv three times weekly. 36 each 3   omeprazole (PRILOSEC) 20 MG capsule TAKE ONE CAPSULE BY MOUTH ONE TIME DAILY 90 capsule 3   ondansetron (ZOFRAN-ODT)  8 MG disintegrating tablet DISSOLVE ONE TABLET BY MOUTH EVERY 8 HOURS AS NEEDED FOR NAUSEA AND VOMITING 20 tablet 0   saxagliptin HCl (ONGLYZA) 5 MG TABS tablet Take 1 tablet (5 mg total) by mouth daily. 30 tablet 1   vitamin C (ASCORBIC ACID) 500 MG tablet Take 500 mg by mouth daily.     pioglitazone (ACTOS) 30 MG tablet Take 1 tablet (30 mg total) by mouth daily. 90 tablet 0   No current facility-administered medications for this visit.    Allergies: Latex, Lidocaine, Peanut-containing drug products, Sulfa antibiotics, Reglan [metoclopramide], Metformin and related, Adhesive [tape], Ciprofloxacin, and Elemental sulfur  Past Medical History:  Diagnosis Date   Abnormal Pap smear of cervix    h/o colposcopy/biopsy   Allergy    Asthma    HX   Breast cancer ([place]) 2007   Cancer [facility]) [phone]   ovarian   Diabetes mellitus without complication ([place]) 78/4696    diagnosed by PCP, [name]    GERD (gastroesophageal reflux disease)    Hyperlipemia    on medication    PONV (postoperative nausea and vomiting)     Past Surgical History:  Procedure Laterality Date   APPENDECTOMY     age [age]   BREAST EXCISIONAL BIOPSY Left 08/2019   BREAST LUMPECTOMY Left 2008   left breast   CHOLECYSTECTOMY  2000   ENDOMETRIAL ABLATION  2009   LAPAROSCOPIC SALPINGO OOPHERECTOMY Left 1996   with right tubal ligation  RADIOACTIVE SEED GUIDED EXCISIONAL BREAST BIOPSY Left 09/13/2019   Procedure: RADIOACTIVE SEED GUIDED EXCISIONAL LEFT BREAST BIOPSY;  Surgeon: [name], MD;  Location: [facility];  Service: General;  Laterality: Left;   TUBAL LIGATION      Family History  Problem Relation Age of Onset   Diabetes Mother    Colon cancer Father    Breast cancer Maternal Aunt    Ovarian cancer Maternal Aunt    Ovarian cancer Cousin    Colon cancer Cousin    Melanoma Cousin    Breast cancer Maternal Grandmother    Cancer Other        breast and ovarian-maternal side    Social  History   Tobacco Use   Smoking status: Never   Smokeless tobacco: Never   Tobacco comments:    NEVER USED TOBACCO  Substance Use Topics   Alcohol use: Yes    Alcohol/week: 1.0 standard drink    Types: 1 Standard drinks or equivalent per week    Subjective:   Follow up on Type 2 Diabetes; had tried Trulicity most recently in hopes that could tolerate GLP1- could not tolerate Rybelsus; had been doing well until this past week- has been noticing that having increased GI symptoms; takes Trulicity on Sunday and notes that sick for at least 24 hours after the injection;  Patient has not been able to tolerate Metformin, Jardiance, Rybelsus and now Trulicity; in the past has done well on Onglyza and Actos;  Would be open to see endocrine but would like to get set up with new endocrinologist;  Fasting blood sugars are averaging 120s/ on 1/9, non-fasting glucose was 130;     Objective:  Vitals:   08/06/21 0837  BP: 122/76  Pulse: 91  Resp: 18  Temp: 97.9 F (36.6 C)  TempSrc: Oral  SpO2: 97%  Weight: 158 lb 8 oz (71.9 kg)  Height: 5\' 4"  (1.626 m)    General: Well developed, well nourished, in no acute distress  Skin : Warm and dry.  Head: Normocephalic and atraumatic  Eyes: Sclera and conjunctiva clear; pupils round and reactive to light; extraocular movements intact  Ears: External normal; canals clear; tympanic membranes normal  Oropharynx: Pink, supple. No suspicious lesions  Neck: Supple without thyromegaly, adenopathy  Lungs: Respirations unlabored; clear to auscultation bilaterally without wheeze, rales, rhonchi  CVS exam: normal rate and regular rhythm.  Neurologic: Alert and oriented; speech intact; face symmetrical; moves all extremities well; CNII-XII intact without focal deficit   Assessment:  1. Type 2 diabetes mellitus without complication, without long-term current use of insulin ([place])     Plan:  Will hold Trulicity for now; will increase Actos to 30 mg and  plan to re-start Onglyza next week since she just took a Trulicity dosage; Will refer to endocrine for consult- ? Candidate for meal time Prandin or basal insulin; Patient to call back on Friday with her blood sugar readings;   This visit occurred during the SARS-CoV-2 public health emergency.  Safety protocols were in place, including screening questions prior to the visit, additional usage of staff PPE, and extensive cleaning of exam room while observing appropriate contact time as indicated for disinfecting solutions.    No follow-ups on file.  Orders Placed This Encounter  Procedures   Ambulatory referral to Endocrinology    Referral Priority:   Urgent    Referral Type:   Consultation    Referral Reason:   Specialty Services Required  Number of Visits Requested:   1    Requested Prescriptions   Signed Prescriptions Disp Refills   pioglitazone (ACTOS) 30 MG tablet 90 tablet 0    Sig: Take 1 tablet (30 mg total) by mouth daily.   saxagliptin HCl (ONGLYZA) 5 MG TABS tablet 30 tablet 1    Sig: Take 1 tablet (5 mg total) by mouth daily.

## 2021-08-08 ENCOUNTER — Encounter: Payer: Self-pay | Admitting: Family

## 2021-08-13 ENCOUNTER — Ambulatory Visit: Payer: 59 | Admitting: Internal Medicine

## 2021-09-04 ENCOUNTER — Ambulatory Visit: Payer: 59 | Admitting: Family Medicine

## 2021-09-07 ENCOUNTER — Other Ambulatory Visit: Payer: Self-pay | Admitting: Family

## 2021-09-11 ENCOUNTER — Other Ambulatory Visit: Payer: Self-pay | Admitting: Medical

## 2021-10-01 DIAGNOSIS — K529 Noninfective gastroenteritis and colitis, unspecified: Secondary | ICD-10-CM

## 2021-10-01 HISTORY — DX: Noninfective gastroenteritis and colitis, unspecified: K52.9

## 2021-10-07 ENCOUNTER — Other Ambulatory Visit: Payer: Self-pay | Admitting: Family

## 2021-10-07 NOTE — Telephone Encounter (Signed)
We had discussed referral to endocrinology at [facility]. Has she been able to schedule with them yet? Just want to make sure I understand what medications she is taking.  ?

## 2021-10-08 NOTE — Telephone Encounter (Signed)
I have called the pt to relay the providers message. There was no answer so I left a message to call back.  ?

## 2021-10-09 ENCOUNTER — Telehealth: Payer: Self-pay

## 2021-10-09 NOTE — Telephone Encounter (Signed)
Pt called in stated that she was returning a call from you. I looked in the system but I couldn't find anything. ?

## 2021-10-10 NOTE — Telephone Encounter (Signed)
I have called and spoken to pt. She stated that she sees the new Endo doctor on Wednesday 10/16/21 and she has an endoscopy and colonoscopy scheduled for Thursday 10/17/21.  ? ?She is currently off of the Actos and taking the ONGLYZA.  ?

## 2021-10-10 NOTE — Telephone Encounter (Signed)
I have called pt back. Mickel Baas wanted to call to get a follow up on how she is doing.  ?

## 2021-10-17 LAB — HM COLONOSCOPY

## 2021-10-22 ENCOUNTER — Encounter: Payer: Self-pay | Admitting: Family

## 2021-10-24 ENCOUNTER — Encounter: Payer: Self-pay | Admitting: Hematology & Oncology

## 2021-12-31 ENCOUNTER — Encounter: Payer: Self-pay | Admitting: Family

## 2021-12-31 ENCOUNTER — Encounter: Payer: Self-pay | Admitting: *Deleted

## 2021-12-31 ENCOUNTER — Ambulatory Visit (INDEPENDENT_AMBULATORY_CARE_PROVIDER_SITE_OTHER): Payer: 59 | Admitting: Family

## 2021-12-31 ENCOUNTER — Other Ambulatory Visit: Payer: Self-pay | Admitting: Obstetrics & Gynecology

## 2021-12-31 VITALS — BP 112/77 | HR 73 | Temp 97.7°F | Resp 12 | Ht 64.5 in | Wt 172.8 lb

## 2021-12-31 DIAGNOSIS — E785 Hyperlipidemia, unspecified: Secondary | ICD-10-CM | POA: Diagnosis not present

## 2021-12-31 DIAGNOSIS — Z0000 Encounter for general adult medical examination without abnormal findings: Secondary | ICD-10-CM | POA: Diagnosis not present

## 2021-12-31 DIAGNOSIS — Z1231 Encounter for screening mammogram for malignant neoplasm of breast: Secondary | ICD-10-CM

## 2021-12-31 DIAGNOSIS — E1169 Type 2 diabetes mellitus with other specified complication: Secondary | ICD-10-CM | POA: Diagnosis not present

## 2021-12-31 LAB — HM DIABETES EYE EXAM

## 2021-12-31 MED ORDER — EZETIMIBE 10 MG PO TABS: 10.0000 mg | ORAL_TABLET | Freq: Every day | ORAL | 3 refills | Status: DC

## 2021-12-31 MED ORDER — METOPROLOL TARTRATE 25 MG PO TABS: 25.0000 mg | ORAL_TABLET | Freq: Two times a day (BID) | ORAL | 3 refills | Status: DC

## 2021-12-31 MED ORDER — FLUTICASONE PROPIONATE 50 MCG/ACT NA SUSP: NASAL | 11 refills | Status: DC

## 2021-12-31 NOTE — Progress Notes (Signed)
Carolyn Wilson is a 51 y.o. female with the following history as recorded in EpicCare:  Patient Active Problem List   Diagnosis Date Noted   Anal skin tag 04/12/2021   Fascial hernia 11/29/2020   Wrist sprain, left, subsequent encounter 08/14/2020   Dyslipidemia 07/31/2020   Type 2 diabetes mellitus without complication, without long-term current use of insulin ([place]) 07/31/2020   Tachycardia 08/05/2017   Right shoulder pain 12/18/2016   Wellness examination 11/21/2015   Diabetes ([place]) 09/04/2015   Allergy with anaphylaxis due to peanuts 08/13/2014   Abnormal Pap smear of cervix  remote Leep  1996 per pt 08/13/2014   Allergic rhinitis 08/13/2014   S/P endometrial ablation 08/13/2014   GERD (gastroesophageal reflux disease) 08/13/2014   Diarrhea post cholecystectomy since 1999 08/13/2014   FHx: colon cancer  father  08/13/2014   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     Insulin Pen Needle (BD PEN NEEDLE NANO U/F) 32G X 4 MM MISC Inject insulin once daily E11.65     Lancets (ONETOUCH ULTRASOFT) lancets Check blood sugar daily as directed. 100 each 12   letrozole (FEMARA) 2.5 MG tablet Take 1 tablet (2.5 mg total) by mouth daily. 30 tablet 6   NONFORMULARY OR COMPOUNDED ITEM Vitamin E vaginal suppositories 200u/ml.  One pv three times weekly. 36 each 3   ondansetron (ZOFRAN-ODT) 8 MG disintegrating tablet DISSOLVE ONE TABLET BY MOUTH EVERY 8 HOURS AS NEEDED FOR NAUSEA AND VOMITING 20 tablet 0   pantoprazole  (PROTONIX) 40 MG tablet Take 40 mg by mouth daily.     vitamin C (ASCORBIC ACID) 500 MG tablet Take 500 mg by mouth daily.     ezetimibe (ZETIA) 10 MG tablet Take 1 tablet (10 mg total) by mouth daily. 90 tablet 3   fluticasone (FLONASE) 50 MCG/ACT nasal spray USE TWO SPRAYS IN EACH NOSTRIL ONE TIME DAILY 16 mL 11   metoprolol tartrate (LOPRESSOR) 25 MG tablet Take 1 tablet (25 mg total) by mouth 2 (two) times daily. 180 tablet 3   No current facility-administered medications for this visit.    Allergies: Latex, Lidocaine, Peanut-containing drug products, Sulfa antibiotics, Reglan [metoclopramide], Metformin and related, Statins, Adhesive [tape], Ciprofloxacin, and Elemental sulfur  Past Medical History:  Diagnosis Date   Abnormal Pap smear of cervix    h/o colposcopy/biopsy   Allergy    Asthma    HX   Breast cancer ([place]) 2007   Cancer [facility]) [phone]   ovarian   Diabetes mellitus without complication ([place]) 54/0981    diagnosed by PCP, [name]    GERD (gastroesophageal reflux disease)    Hyperlipemia    on medication    PONV (postoperative nausea and vomiting)     Past Surgical History:  Procedure Laterality Date   APPENDECTOMY     age [age]   BREAST EXCISIONAL BIOPSY Left 08/2019   BREAST LUMPECTOMY Left 2008   left breast   CHOLECYSTECTOMY  2000   ENDOMETRIAL ABLATION  2009  LAPAROSCOPIC SALPINGO OOPHERECTOMY Left 1996   with right tubal ligation   RADIOACTIVE SEED GUIDED EXCISIONAL BREAST BIOPSY Left 09/13/2019   Procedure: RADIOACTIVE SEED GUIDED EXCISIONAL LEFT BREAST BIOPSY;  Surgeon: [name], MD;  Location: [place];  Service: General;  Laterality: Left;   TUBAL LIGATION      Family History  Problem Relation Age of Onset   Diabetes Mother    Colon cancer Father    Breast cancer Maternal Aunt    Ovarian cancer Maternal Aunt    Ovarian cancer Cousin    Colon cancer Cousin    Melanoma Cousin    Breast cancer Maternal Grandmother     Cancer Other        breast and ovarian-maternal side    Social History   Tobacco Use   Smoking status: Never   Smokeless tobacco: Never   Tobacco comments:    NEVER USED TOBACCO  Substance Use Topics   Alcohol use: Yes    Alcohol/week: 1.0 standard drink of alcohol    Types: 1 Standard drinks or equivalent per week    Subjective:  Presents for yearly CPE; no acute concerns; Does see GYN, endocrinology, oncology regularly;  Has seen GI since last seen by our office;  Up to date on dental and vision exams;  Needs refills updated;   Review of Systems  Constitutional: Negative.   HENT: Negative.    Eyes: Negative.   Respiratory: Negative.    Cardiovascular: Negative.   Gastrointestinal: Negative.   Genitourinary: Negative.   Musculoskeletal: Negative.   Skin: Negative.   Neurological: Negative.   Endo/Heme/Allergies: Negative.   Psychiatric/Behavioral: Negative.         Objective:  Vitals:   12/31/21 0845  BP: 112/77  Pulse: 73  Resp: 12  Temp: 97.7 F (36.5 C)  TempSrc: Oral  SpO2: 97%  Weight: 172 lb 12.8 oz (78.4 kg)  Height: 5' 4.5" (1.638 m)    General: Well developed, well nourished, in no acute distress  Skin : Warm and dry.  Head: Normocephalic and atraumatic  Eyes: Sclera and conjunctiva clear; pupils round and reactive to light; extraocular movements intact  Ears: External normal; canals clear; tympanic membranes normal  Oropharynx: Pink, supple. No suspicious lesions  Neck: Supple without thyromegaly, adenopathy  Lungs: Respirations unlabored; clear to auscultation bilaterally without wheeze, rales, rhonchi  CVS exam: normal rate and regular rhythm.  Abdomen: Soft; nontender; nondistended; normoactive bowel sounds; no masses or hepatosplenomegaly  Musculoskeletal: No deformities; no active joint inflammation  Extremities: No edema, cyanosis, clubbing  Vessels: Symmetric bilaterally  Neurologic: Alert and oriented; speech intact; face  symmetrical; moves all extremities well; CNII-XII intact without focal deficit   Assessment:  1. PE (physical exam), annual   2. Hyperlipidemia associated with type 2 diabetes mellitus ([place])     Plan:  Age appropriate preventive healthcare needs addressed; encouraged regular eye doctor and dental exams; encouraged regular exercise; will update labs and refills as needed today; follow-up in 1 year, sooner prn.    No follow-ups on file.  No orders of the defined types were placed in this encounter.   Requested Prescriptions   Signed Prescriptions Disp Refills   ezetimibe (ZETIA) 10 MG tablet 90 tablet 3    Sig: Take 1 tablet (10 mg total) by mouth daily.   fluticasone (FLONASE) 50 MCG/ACT nasal spray 16 mL 11    Sig: USE TWO SPRAYS IN EACH NOSTRIL ONE TIME DAILY   metoprolol tartrate (LOPRESSOR) 25  MG tablet 180 tablet 3    Sig: Take 1 tablet (25 mg total) by mouth 2 (two) times daily.

## 2022-01-01 DIAGNOSIS — K580 Irritable bowel syndrome with diarrhea: Secondary | ICD-10-CM | POA: Insufficient documentation

## 2022-01-01 DIAGNOSIS — R1114 Bilious vomiting: Secondary | ICD-10-CM | POA: Insufficient documentation

## 2022-01-01 DIAGNOSIS — R1319 Other dysphagia: Secondary | ICD-10-CM | POA: Insufficient documentation

## 2022-01-01 HISTORY — DX: Other dysphagia: R13.19

## 2022-01-01 HISTORY — DX: Bilious vomiting: R11.14

## 2022-01-01 HISTORY — DX: Irritable bowel syndrome with diarrhea: K58.0

## 2022-01-05 ENCOUNTER — Other Ambulatory Visit: Payer: Self-pay | Admitting: Family Medicine

## 2022-01-24 ENCOUNTER — Other Ambulatory Visit: Payer: 59

## 2022-01-24 ENCOUNTER — Ambulatory Visit: Payer: 59 | Admitting: Hematology & Oncology

## 2022-01-27 ENCOUNTER — Ambulatory Visit: Payer: 59 | Admitting: Family Medicine

## 2022-02-03 DIAGNOSIS — E1143 Type 2 diabetes mellitus with diabetic autonomic (poly)neuropathy: Secondary | ICD-10-CM | POA: Insufficient documentation

## 2022-02-03 HISTORY — DX: Type 2 diabetes mellitus with diabetic autonomic (poly)neuropathy: E11.43

## 2022-02-05 ENCOUNTER — Encounter: Payer: Self-pay | Admitting: Orthopedic Surgery

## 2022-02-05 ENCOUNTER — Ambulatory Visit: Payer: 59 | Admitting: Orthopedic Surgery

## 2022-02-05 ENCOUNTER — Ambulatory Visit
Admission: RE | Admit: 2022-02-05 | Discharge: 2022-02-05 | Disposition: A | Discharge status: Home / Self Care | Payer: 59 | Source: Ambulatory Visit | Attending: Obstetrics & Gynecology | Admitting: Obstetrics & Gynecology

## 2022-02-05 DIAGNOSIS — R2241 Localized swelling, mass and lump, right lower limb: Secondary | ICD-10-CM | POA: Diagnosis not present

## 2022-02-05 DIAGNOSIS — Z1231 Encounter for screening mammogram for malignant neoplasm of breast: Secondary | ICD-10-CM

## 2022-02-05 NOTE — Progress Notes (Signed)
Office Visit Note   Patient: Carolyn Wilson           Date of Birth: 28-Jul-1970           MRN: [id number] Visit Date: 02/05/2022 Requested by: Marrian Salvage, [address] [place],  [place] [address] PCP: Marrian Salvage, FNP  Subjective: Chief Complaint  Patient presents with   Right Leg - Pain    HPI: Carolyn Wilson is a 51 year old patient with small right leg mass along the tibial crest midshaft which is been going on for a year.  She reports that it becomes painful with increased activity.  At times she will 17,000 steps a day when she is hiking.  Recently had colonoscopy and was referred to a neurologist for possible neurofibromatosis.  She has had MRI scan performed which is reviewed.  She does sitdown work in Engineer, mining for [employer]  At times her right leg feels like it has shinsplints.  She has used a compressive brace with some relief.  Takes occasional Tylenol for her symptoms but she is very sensitive to medication so she does not take too much.              ROS: All systems reviewed are negative as they relate to the chief complaint within the history of present illness.  Patient denies  fevers or chills.   Assessment & Plan: Visit Diagnoses:  1. Lower leg mass, right     Plan: Impression is small right leg mass which is freely mobile but appears to be more associated with the anterior tibial cortex as opposed to any anterior compartment muscle.  Differential diagnosis includes subdermal fatty tissue calcification versus tissue herniation versus giant cell tumor associated with the tendon sheath versus possible dermatologic manifestation of neurofibromatosis.  None of those entities really fit with all her clinical presentation.  It does not appear to be malignant or requiring intervention at this time unless there is a cosmetic concern or a concern that she does wants to have that mass out of her leg.  Plan for now with observation but  if symptoms change or if this gets larger should be revisited and could consider further intervention.  She will follow-up as needed.  Follow-Up Instructions: No follow-ups on file.   Orders:  No orders of the defined types were placed in this encounter.  No orders of the defined types were placed in this encounter.     Procedures: No procedures performed   Clinical Data: No additional findings.  Objective: Vital Signs: LMP 04/27/2017 Comment: spotting  Physical Exam:   Constitutional: Patient appears well-developed HEENT:  Head: Normocephalic Eyes:EOM are normal Neck: Normal range of motion Cardiovascular: Normal rate Pulmonary/chest: Effort normal Neurologic: Patient is alert Skin: Skin is warm Psychiatric: Patient has normal mood and affect   Ortho Exam: Ortho exam demonstrates full active and passive range of motion of the knee and right leg.  She does have a 6 x 6 mm freely mobile mass along the anterior crest of the mid tibia.  Does not really move with ankle dorsiflexion and plantarflexion.  Negative Tinel's on the mass .  Specialty Comments:  No specialty comments available.  Imaging: No results found.   PMFS History: Patient Active Problem List   Diagnosis Date Noted   Anal skin tag 04/12/2021   Fascial hernia 11/29/2020   Wrist sprain, left, subsequent encounter 08/14/2020   Dyslipidemia 07/31/2020   Type 2 diabetes mellitus without  complication, without long-term current use of insulin ([place]) 07/31/2020   Tachycardia 08/05/2017   Right shoulder pain 12/18/2016   Wellness examination 11/21/2015   Diabetes ([place]) 09/04/2015   Allergy with anaphylaxis due to peanuts 08/13/2014   Abnormal Pap smear of cervix  remote [place]  1996 per pt 08/13/2014   Allergic rhinitis 08/13/2014   S/P endometrial ablation 08/13/2014   GERD (gastroesophageal reflux disease) 08/13/2014   Diarrhea post cholecystectomy since 1999 08/13/2014   FHx: colon cancer  father   08/13/2014   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    GERD (gastroesophageal reflux disease)    Hyperlipemia    on medication    PONV (postoperative nausea and vomiting)     Family History  Problem Relation Age of Onset   Diabetes Mother    Colon cancer Father    Breast cancer Maternal Aunt    Ovarian cancer Maternal Aunt    Ovarian cancer Cousin    Colon cancer Cousin    Melanoma Cousin    Breast cancer Maternal Grandmother    Cancer Other        breast and ovarian-maternal side    Past Surgical History:  Procedure Laterality Date   APPENDECTOMY     age [age]   BREAST EXCISIONAL BIOPSY Left 08/2019   BREAST LUMPECTOMY Left 2008   left breast   CHOLECYSTECTOMY  2000   ENDOMETRIAL ABLATION  2009   LAPAROSCOPIC SALPINGO OOPHERECTOMY Left 1996   with right tubal ligation   RADIOACTIVE SEED GUIDED EXCISIONAL BREAST BIOPSY Left 09/13/2019   Procedure: RADIOACTIVE SEED GUIDED EXCISIONAL LEFT BREAST BIOPSY;  Surgeon: [name], MD;  Location: [facility];  Service: General;  Laterality: Left;   TUBAL LIGATION     Social History   Occupational History   Not on file  Tobacco Use   Smoking status: Never   Smokeless tobacco: Never   Tobacco comments:    NEVER USED TOBACCO  Vaping Use   Vaping Use: Never used  Substance and Sexual Activity   Alcohol use: Yes    Alcohol/week: 1.0 standard drink of alcohol    Types: 1 Standard drinks or equivalent per week   Drug use: No   Sexual activity: Yes    Partners: Male    Birth  control/protection: Surgical

## 2022-02-18 ENCOUNTER — Other Ambulatory Visit: Payer: 59

## 2022-02-18 ENCOUNTER — Ambulatory Visit: Payer: 59 | Admitting: Hematology & Oncology

## 2022-02-20 ENCOUNTER — Inpatient Hospital Stay: Payer: 59 | Attending: Hematology & Oncology

## 2022-02-20 ENCOUNTER — Other Ambulatory Visit: Payer: Self-pay

## 2022-02-20 ENCOUNTER — Telehealth: Payer: Self-pay | Admitting: *Deleted

## 2022-02-20 ENCOUNTER — Inpatient Hospital Stay: Payer: 59 | Admitting: Hematology & Oncology

## 2022-02-20 ENCOUNTER — Encounter: Payer: Self-pay | Admitting: Hematology & Oncology

## 2022-02-20 VITALS — BP 115/71 | HR 78 | Temp 97.9°F | Resp 18 | Ht 64.0 in | Wt 173.0 lb

## 2022-02-20 DIAGNOSIS — D471 Chronic myeloproliferative disease: Secondary | ICD-10-CM | POA: Insufficient documentation

## 2022-02-20 DIAGNOSIS — Z803 Family history of malignant neoplasm of breast: Secondary | ICD-10-CM | POA: Diagnosis not present

## 2022-02-20 DIAGNOSIS — Z79811 Long term (current) use of aromatase inhibitors: Secondary | ICD-10-CM | POA: Diagnosis not present

## 2022-02-20 DIAGNOSIS — N6092 Unspecified benign mammary dysplasia of left breast: Secondary | ICD-10-CM | POA: Insufficient documentation

## 2022-02-20 DIAGNOSIS — E119 Type 2 diabetes mellitus without complications: Secondary | ICD-10-CM | POA: Diagnosis not present

## 2022-02-20 DIAGNOSIS — Z79899 Other long term (current) drug therapy: Secondary | ICD-10-CM | POA: Insufficient documentation

## 2022-02-20 DIAGNOSIS — N6091 Unspecified benign mammary dysplasia of right breast: Secondary | ICD-10-CM | POA: Diagnosis not present

## 2022-02-20 DIAGNOSIS — D271 Benign neoplasm of left ovary: Secondary | ICD-10-CM | POA: Diagnosis not present

## 2022-02-20 DIAGNOSIS — Z794 Long term (current) use of insulin: Secondary | ICD-10-CM | POA: Insufficient documentation

## 2022-02-20 DIAGNOSIS — Q8501 Neurofibromatosis, type 1: Secondary | ICD-10-CM | POA: Diagnosis not present

## 2022-02-20 DIAGNOSIS — Z8041 Family history of malignant neoplasm of ovary: Secondary | ICD-10-CM

## 2022-02-20 LAB — CBC WITH DIFFERENTIAL (CANCER CENTER ONLY)
Abs Immature Granulocytes: 0.02 10*3/uL (ref 0.00–0.07)
Basophils Absolute: 0.1 10*3/uL (ref 0.0–0.1)
Basophils Relative: 1 %
Eosinophils Absolute: 0.2 10*3/uL (ref 0.0–0.5)
Eosinophils Relative: 3 %
HCT: 45.2 % (ref 36.0–46.0)
Hemoglobin: 14.9 g/dL (ref 12.0–15.0)
Immature Granulocytes: 0 %
Lymphocytes Relative: 30 %
Lymphs Abs: 2.5 10*3/uL (ref 0.7–4.0)
MCH: 29.9 pg (ref 26.0–34.0)
MCHC: 33 g/dL (ref 30.0–36.0)
MCV: 90.8 fL (ref 80.0–100.0)
Monocytes Absolute: 0.5 10*3/uL (ref 0.1–1.0)
Monocytes Relative: 6 %
Neutro Abs: 5.1 10*3/uL (ref 1.7–7.7)
Neutrophils Relative %: 60 %
Platelet Count: 316 10*3/uL (ref 150–400)
RBC: 4.98 MIL/uL (ref 3.87–5.11)
RDW: 12.6 % (ref 11.5–15.5)
WBC Count: 8.3 10*3/uL (ref 4.0–10.5)
nRBC: 0 % (ref 0.0–0.2)

## 2022-02-20 LAB — CMP (CANCER CENTER ONLY)
ALT: 37 U/L (ref 0–44)
AST: 32 U/L (ref 15–41)
Albumin: 4.7 g/dL (ref 3.5–5.0)
Alkaline Phosphatase: 102 U/L (ref 38–126)
Anion gap: 9 (ref 5–15)
BUN: 13 mg/dL (ref 6–20)
CO2: 33 mmol/L — ABNORMAL HIGH (ref 22–32)
Calcium: 10.5 mg/dL — ABNORMAL HIGH (ref 8.9–10.3)
Chloride: 101 mmol/L (ref 98–111)
Creatinine: 1.1 mg/dL — ABNORMAL HIGH (ref 0.44–1.00)
GFR, Estimated: >60 mL/min (ref >60)
Glucose, Bld: 178 mg/dL — ABNORMAL HIGH (ref 70–99)
Potassium: 4.4 mmol/L (ref 3.5–5.1)
Sodium: 143 mmol/L (ref 135–145)
Total Bilirubin: 0.7 mg/dL (ref 0.3–1.2)
Total Protein: 7.3 g/dL (ref 6.5–8.1)

## 2022-02-20 LAB — LACTATE DEHYDROGENASE: LDH: 185 U/L (ref 98–192)

## 2022-02-20 NOTE — Progress Notes (Signed)
Hematology and Oncology Follow Up Visit  Carolyn Wilson [id number] [birth year] 51 y.o. 02/20/2022   Principle Diagnosis:  1. History of Sertoli-Leydig tumor of the left ovary  2. Strong family history of breast and ovarian cancer - BRCA 1/2 and p53 negative 3. 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 Complex Sclerosing Lesion with usual ductal hyperplasia of the LEFT breast (central)   Current Therapy:         Femara 2.5 po  q day - start on 10/31/2020   Interim History:  Carolyn Wilson is here today for follow-up.  The interesting news is that she was diagnosed with neurofibromatosis Type I.  Having her she had a colonoscopy and a unusual polyp was found.  This was done at [facility].  I will have to try to find the report for this.  She had MRI of the brain done.  Apparently, she has optic atrophy of the right eye.  She has no vision of the right eye.  The MRI of the brain really was unremarkable.  She does have diabetes.  She is on insulin.  Tried on one of the injectables which she had a very hard time taking.  She has gained a little bit of weight.  She is still working.  She is quite busy at work.  She has had no problems with bowels or bladder episode of diarrhea.  She has had no cough or shortness of breath.  She has had no rashes.  There has been no leg swelling.  She has had no headache.  She is on Femara.  She does have occasional arthralgias and myalgias.  Overall, I would say performance status is ECOG 0.   She has medications:  Allergies as of 02/20/2022       Reactions   Latex Rash   Causes blisters   Lidocaine Nausea And Vomiting   Peanut-containing Drug Products Anaphylaxis   Statins Other (See Comments)   Unable to tolerate   Sulfa Antibiotics Shortness Of Breath, Rash   Metformin And Related Diarrhea   Reglan [metoclopramide] Tinitus   Adhesive [tape] Rash   Ciprofloxacin Rash   Elemental Sulfur  Rash        Medication List        Accurate as of February 20, 2022  8:37 AM. If you have any questions, ask your nurse or doctor.          STOP taking these medications    pantoprazole 40 MG tablet Commonly known as: PROTONIX Stopped by: Volanda Napoleon, MD       TAKE these medications    ascorbic acid 500 MG tablet Commonly known as: VITAMIN C Take 500 mg by mouth daily.   Basaglar KwikPen 100 UNIT/ML 22 Units daily.   BD Pen Needle Nano U/F 32G X 4 MM Misc Generic drug: Insulin Pen Needle Inject insulin once daily E11.65   ezetimibe 10 MG tablet Commonly known as: ZETIA Take 1 tablet (10 mg total) by mouth daily.   fluticasone 50 MCG/ACT nasal spray Commonly known as: FLONASE USE TWO SPRAYS IN EACH NOSTRIL ONE TIME DAILY   gabapentin 300 MG capsule Commonly known as: NEURONTIN TAKE ONE CAPSULE BY MOUTH ONE TIME DAILY AT BEDTIME   letrozole 2.5 MG tablet Commonly known as: FEMARA Take 1 tablet (2.5 mg total) by mouth daily.   metoprolol tartrate 25 MG tablet Commonly known as: LOPRESSOR TAKE ONE TABLET BY MOUTH TWICE A DAY   NONFORMULARY  OR COMPOUNDED ITEM Vitamin E vaginal suppositories 200u/ml.  One pv three times weekly.   ondansetron 8 MG disintegrating tablet Commonly known as: ZOFRAN-ODT DISSOLVE ONE TABLET BY MOUTH EVERY 8 HOURS AS NEEDED FOR NAUSEA AND VOMITING   onetouch ultrasoft lancets Check blood sugar daily as directed.   OneTouch Verio test strip Generic drug: glucose blood USE TO TEST ONE TIME DAILY AS DIRECTED   RABEprazole 20 MG tablet Commonly known as: ACIPHEX Take 1 tablet by mouth in the morning and at bedtime.        Allergies:  Allergies  Allergen Reactions   Latex Rash    Causes blisters   Lidocaine Nausea And Vomiting   Peanut-Containing Drug Products Anaphylaxis   Statins Other (See Comments)    Unable to tolerate   Sulfa Antibiotics Shortness Of Breath and Rash   Metformin And Related Diarrhea    Reglan [Metoclopramide] Tinitus   Adhesive [Tape] Rash   Ciprofloxacin Rash   Elemental Sulfur Rash    Past Medical History, Surgical history, Social history, and Family History were reviewed and updated.  Review of Systems: Review of Systems  Constitutional: Negative.   HENT: Negative.    Eyes: Negative.   Respiratory: Negative.    Cardiovascular: Negative.   Gastrointestinal: Negative.   Genitourinary: Negative.   Musculoskeletal: Negative.   Skin: Negative.   Neurological: Negative.   Endo/Heme/Allergies: Negative.   Psychiatric/Behavioral: Negative.     [name]   Physical Exam:  height is '5\' 4"'  (1.626 m) and weight is 173 lb 0.3 oz (78.5 kg). Her oral temperature is 97.9 F (36.6 C). Her blood pressure is 115/71 and her pulse is 78. Her respiration is 18 and oxygen saturation is 100%.   Wt Readings from Last 3 Encounters:  02/20/22 173 lb 0.3 oz (78.5 kg)  12/31/21 172 lb 12.8 oz (78.4 kg)  08/06/21 158 lb 8 oz (71.9 kg)    Physical Exam Vitals reviewed.  Constitutional:      Comments: Her breast exam shows right breast with no masses, edema or erythema. She has the biopsy site at about the 10 o'clock position on the right breast. There is no erythema or ecchymosis associated with this. There is no right axillary adenopathy.  Left breast shows the lumpectomy scar at the edge of the areola at 3:00.  There is no erythema or warmth.  There is no discharge noted.  There is no tenderness to palpation.  There is no left axillary adenopathy.  HENT:     Head: Normocephalic and atraumatic.  Eyes:     Pupils: Pupils are equal, round, and reactive to light.  Cardiovascular:     Rate and Rhythm: Normal rate and regular rhythm.     Heart sounds: Normal heart sounds.  Pulmonary:     Effort: Pulmonary effort is normal.     Breath sounds: Normal breath sounds.  Abdominal:     General: Bowel sounds are normal.     Palpations: Abdomen is soft.  Musculoskeletal:        General: No  tenderness or deformity. Normal range of motion.     Cervical back: Normal range of motion.  Lymphadenopathy:     Cervical: No cervical adenopathy.  Skin:    General: Skin is warm and dry.     Findings: No erythema or rash.  Neurological:     Mental Status: She is alert and oriented to person, place, and time.  Psychiatric:        Behavior:  Behavior normal.        Thought Content: Thought content normal.        Judgment: Judgment normal.     Lab Results  Component Value Date   WBC 8.3 02/20/2022   HGB 14.9 02/20/2022   HCT 45.2 02/20/2022   MCV 90.8 02/20/2022   PLT 316 02/20/2022   Lab Results  Component Value Date   FERRITIN 28.9 01/24/2021   Lab Results  Component Value Date   RBC 4.98 02/20/2022   No results found for: "KPAFRELGTCHN", "LAMBDASER", "KAPLAMBRATIO" No results found for: "IGGSERUM", "IGA", "IGMSERUM" No results found for: "TOTALPROTELP", "ALBUMINELP", "A1GS", "A2GS", "BETS", "BETA2SER", "GAMS", "MSPIKE", "SPEI"   Chemistry      Component Value Date/Time   NA 141 07/26/2021 0737   NA 142 11/01/2019 0902   NA 144 03/11/2017 0822   K 4.2 07/26/2021 0737   K 4.5 03/11/2017 0822   CL 103 07/26/2021 0737   CL 104 03/11/2017 0822   CO2 31 07/26/2021 0737   CO2 29 03/11/2017 0822   BUN 10 07/26/2021 0737   BUN 14 11/01/2019 0902   BUN 9 03/11/2017 0822   CREATININE 0.95 07/26/2021 0737   CREATININE 0.82 04/05/2020 0732      Component Value Date/Time   CALCIUM 9.9 07/26/2021 0737   CALCIUM 9.2 03/11/2017 0822   ALKPHOS 85 07/26/2021 0737   ALKPHOS 65 03/11/2017 0822   AST 18 07/26/2021 0737   ALT 20 07/26/2021 0737   ALT 25 03/11/2017 0822   BILITOT 0.5 07/26/2021 0737       Impression and Plan: Ms. Mclamb is a 51 yo post-menopausal female with a 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   She was diagnosed with stromal hyperplasia of the right breast and ductal hyperplasia of the left  breast in December 2020.    She had a left breast lumpectomy with radioactive seed placement in February 2021.   She has had a similar clinical problem in the right breast.  She had a biopsy back in August 2021.    She is doing well on the Femara.  We will keep her on Femara.  I will see any issues with Femara.  Again, surprised about this diagnosis of NF1.  We get information from [facility] to try to see what is exactly going on.  From my point of view, I do not see any issues with respect to the ovarian issue or breast cancer/precancer.  She will continue the Femara.  Hopefully, her diabetes will get a little bit better.  We will plan to get her back in another 6 months.   Volanda Napoleon, MD 8/3/20238:37 AM

## 2022-02-20 NOTE — Telephone Encounter (Signed)
Per 02/20/22 los - gave upcoming appointments - confirmed

## 2022-04-02 ENCOUNTER — Encounter: Payer: Self-pay | Admitting: Family

## 2022-05-29 ENCOUNTER — Encounter (HOSPITAL_BASED_OUTPATIENT_CLINIC_OR_DEPARTMENT_OTHER): Payer: Self-pay | Admitting: Obstetrics & Gynecology

## 2022-06-20 ENCOUNTER — Other Ambulatory Visit: Payer: Self-pay

## 2022-06-20 ENCOUNTER — Emergency Department (HOSPITAL_BASED_OUTPATIENT_CLINIC_OR_DEPARTMENT_OTHER)
Admission: EM | Admit: 2022-06-20 | Discharge: 2022-06-20 | Disposition: A | Discharge status: Home / Self Care | Payer: 59 | Attending: Student | Admitting: Student

## 2022-06-20 ENCOUNTER — Telehealth: Payer: Self-pay | Admitting: *Deleted

## 2022-06-20 ENCOUNTER — Emergency Department (HOSPITAL_BASED_OUTPATIENT_CLINIC_OR_DEPARTMENT_OTHER): Payer: 59

## 2022-06-20 ENCOUNTER — Encounter (HOSPITAL_BASED_OUTPATIENT_CLINIC_OR_DEPARTMENT_OTHER): Payer: Self-pay | Admitting: *Deleted

## 2022-06-20 DIAGNOSIS — Z20822 Contact with and (suspected) exposure to covid-19: Secondary | ICD-10-CM | POA: Diagnosis not present

## 2022-06-20 DIAGNOSIS — Z8543 Personal history of malignant neoplasm of ovary: Secondary | ICD-10-CM | POA: Diagnosis not present

## 2022-06-20 DIAGNOSIS — Z853 Personal history of malignant neoplasm of breast: Secondary | ICD-10-CM | POA: Diagnosis not present

## 2022-06-20 DIAGNOSIS — Z794 Long term (current) use of insulin: Secondary | ICD-10-CM | POA: Insufficient documentation

## 2022-06-20 DIAGNOSIS — E119 Type 2 diabetes mellitus without complications: Secondary | ICD-10-CM | POA: Insufficient documentation

## 2022-06-20 DIAGNOSIS — R059 Cough, unspecified: Secondary | ICD-10-CM | POA: Diagnosis present

## 2022-06-20 DIAGNOSIS — Z7952 Long term (current) use of systemic steroids: Secondary | ICD-10-CM | POA: Insufficient documentation

## 2022-06-20 DIAGNOSIS — J069 Acute upper respiratory infection, unspecified: Secondary | ICD-10-CM | POA: Insufficient documentation

## 2022-06-20 DIAGNOSIS — Z7951 Long term (current) use of inhaled steroids: Secondary | ICD-10-CM | POA: Insufficient documentation

## 2022-06-20 DIAGNOSIS — Z91040 Latex allergy status: Secondary | ICD-10-CM | POA: Insufficient documentation

## 2022-06-20 DIAGNOSIS — J45909 Unspecified asthma, uncomplicated: Secondary | ICD-10-CM | POA: Diagnosis not present

## 2022-06-20 DIAGNOSIS — Z91010 Allergy to peanuts: Secondary | ICD-10-CM | POA: Diagnosis not present

## 2022-06-20 DIAGNOSIS — R051 Acute cough: Secondary | ICD-10-CM

## 2022-06-20 LAB — RESP PANEL BY RT-PCR (FLU A&B, COVID) ARPGX2
Influenza A by PCR: NEGATIVE
Influenza B by PCR: NEGATIVE
SARS Coronavirus 2 by RT PCR: NEGATIVE

## 2022-06-20 MED ORDER — ALBUTEROL SULFATE HFA 108 (90 BASE) MCG/ACT IN AERS
1.0000 | INHALATION_SPRAY | Freq: Once | RESPIRATORY_TRACT | Status: AC
  Administered 2022-06-20: 1 via RESPIRATORY_TRACT
  Filled 2022-06-20: qty 6.7

## 2022-06-20 MED ORDER — BENZONATATE 100 MG PO CAPS
200.0000 mg | ORAL_CAPSULE | Freq: Once | ORAL | Status: AC
  Administered 2022-06-20: 200 mg via ORAL
  Filled 2022-06-20: qty 2

## 2022-06-20 MED ORDER — METHYLPREDNISOLONE 4 MG PO TBPK: ORAL_TABLET | ORAL | 0 refills | Status: DC

## 2022-06-20 MED ORDER — PROMETHAZINE-DM 6.25-15 MG/5ML PO SYRP: 2.5000 mL | ORAL_SOLUTION | Freq: Four times a day (QID) | ORAL | 0 refills | Status: DC | PRN

## 2022-06-20 MED ORDER — IPRATROPIUM-ALBUTEROL 0.5-2.5 (3) MG/3ML IN SOLN
3.0000 mL | Freq: Once | RESPIRATORY_TRACT | Status: AC
  Administered 2022-06-20: 3 mL via RESPIRATORY_TRACT
  Filled 2022-06-20: qty 3

## 2022-06-20 MED ORDER — BENZONATATE 100 MG PO CAPS: 100.0000 mg | ORAL_CAPSULE | Freq: Three times a day (TID) | ORAL | 0 refills | Status: DC

## 2022-06-20 NOTE — ED Notes (Signed)
Patient and family verbalized understanding of discharge instructions and reasons to return to the ED

## 2022-06-20 NOTE — Telephone Encounter (Signed)
Who Is Calling Patient / Member / Family / Caregiver Call Type Triage / Clinical Relationship To Patient Self Return Phone Number [phone] (Primary) Chief Complaint CHEST PAIN - pain, pressure, heaviness or tightness Reason for Call Request to Schedule Office Appointment Initial Comment Caller wants to make an appt. She says she is coughing and feels like someone is sitting on her chest. She says she thinks she has bronchitis again. She says a heavy feeling on her chest. Translation No Nurse Assessment Nurse: Hassell Done, RN, Joelene Millin Date/Time (Eastern Time): 06/20/2022 8:08:59 AM Confirm and document reason for call. If symptomatic, describe symptoms. ---caller states she has a cough and is experiencing chest pressure and tightness. fever last night of 101. no current fever.  Final Disposition 06/20/2022 8:13:39 AM Go to ED Now Yes Hassell Done, RN, [facility]  Referrals [facility] - ED

## 2022-06-20 NOTE — ED Provider Notes (Signed)
[facility] EMERGENCY DEPARTMENT Provider Note  CSN: [id number] Arrival date & time: 06/20/22 2505  Chief Complaint(s) Cough and Shortness of Breath (/)  HPI Carolyn Wilson is a 51 y.o. female with PMH gastroparesis secondary to T2DM, asthma, ovarian cancer, breast cancer who presents emergency department for evaluation of cough.  Patient endorses upper respiratory symptoms and cough starting over the last 24 hours that have worsened overnight.  Endorsing chest tightness but denies chest pain.  Denies abdominal pain, nausea, vomiting or other systemic symptoms.  Husband states that he had similar symptoms last week.  Patient states she is primarily concerned she has pneumonia today.   Past Medical History Past Medical History:  Diagnosis Date   Abnormal Pap smear of cervix    h/o colposcopy/biopsy   Allergy    Asthma    HX   Breast cancer ([place]) 2007   Cancer [facility]) [phone]   ovarian   Diabetes mellitus without complication ([place]) 93/7902    diagnosed by PCP, [name]    GERD (gastroesophageal reflux disease)    Hyperlipemia    on medication    PONV (postoperative nausea and vomiting)    Patient Active Problem List   Diagnosis Date Noted   Anal skin tag 04/12/2021   Fascial hernia 11/29/2020   Wrist sprain, left, subsequent encounter 08/14/2020   Dyslipidemia 07/31/2020   Type 2 diabetes mellitus without complication, without long-term current use of insulin ([place]) 07/31/2020   Tachycardia 08/05/2017   Right shoulder pain 12/18/2016   Wellness examination 11/21/2015   Diabetes ([place]) 09/04/2015   Allergy with anaphylaxis due to peanuts 08/13/2014   Abnormal Pap smear of cervix  remote Leep  1996 per pt 08/13/2014   Allergic rhinitis 08/13/2014   S/P endometrial ablation 08/13/2014   GERD (gastroesophageal reflux disease) 08/13/2014   Diarrhea post cholecystectomy since 1999 08/13/2014   FHx: colon cancer  father  08/13/2014   Sertoli-Leydig cell tumor  of ovary 08/13/2012   Headache, migraine 08/02/2012   Family history of breast cancer 01/06/2012   Family history of malignant neoplasm of ovary 01/06/2012   Home Medication(s) Prior to Admission medications   Medication Sig Start Date End Date Taking? Authorizing Provider  benzonatate (TESSALON) 100 MG capsule Take 1 capsule (100 mg total) by mouth every 8 (eight) hours. 06/20/22  Yes Xhaiden Coombs, MD  methylPREDNISolone (MEDROL DOSEPAK) 4 MG TBPK tablet Take as prescribed 06/20/22  Yes Manasvi Dickard, MD  promethazine-dextromethorphan (PROMETHAZINE-DM) 6.25-15 MG/5ML syrup Take 2.5 mLs by mouth 4 (four) times daily as needed for cough. 06/20/22  Yes Breylin Dom, MD  ezetimibe (ZETIA) 10 MG tablet Take 1 tablet (10 mg total) by mouth daily. 12/31/21   Marrian Salvage, FNP  fluticasone [facility]) 50 MCG/ACT nasal spray USE TWO SPRAYS IN [facility] NOSTRIL ONE TIME DAILY 12/31/21   Marrian Salvage, FNP  gabapentin (NEURONTIN) 300 MG capsule TAKE ONE CAPSULE BY MOUTH ONE TIME DAILY AT BEDTIME 06/10/21   Megan Salon, MD  glucose blood [facility] VERIO) test strip USE TO TEST ONE TIME DAILY AS DIRECTED 05/29/21   Shamleffer, Melanie Crazier, MD  Insulin Glargine (BASAGLAR KWIKPEN) 100 UNIT/ML 22 Units daily. 10/31/21   [provider]  Insulin Pen Needle (BD PEN NEEDLE NANO U/F) 32G X 4 MM MISC Inject insulin once daily E11.65 10/16/21   [provider]  Lancets (ONETOUCH ULTRASOFT) lancets Check blood sugar daily as directed. 05/29/21   Shamleffer, Melanie Crazier, MD  letrozole [facility]) 2.5 MG  tablet Take 1 tablet (2.5 mg total) by mouth daily. 07/26/21   Volanda Napoleon, MD  metoprolol tartrate (LOPRESSOR) 25 MG tablet TAKE ONE TABLET BY MOUTH TWICE A DAY 01/06/22   Marrian Salvage, FNP  NONFORMULARY OR COMPOUNDED ITEM Vitamin E vaginal suppositories 200u/ml.  One pv three times weekly. 06/11/21   Megan Salon, MD  ondansetron (ZOFRAN-ODT) 8 MG disintegrating  tablet DISSOLVE ONE TABLET BY MOUTH EVERY 8 HOURS AS NEEDED FOR NAUSEA AND VOMITING 12/11/20   Saguier, Percell Miller, PA-C  RABEprazole (ACIPHEX) 20 MG tablet Take 1 tablet by mouth in the morning and at bedtime. 01/25/22   [provider]  vitamin C (ASCORBIC ACID) 500 MG tablet Take 500 mg by mouth daily.    [provider]                                                                                                                                    Past Surgical History Past Surgical History:  Procedure Laterality Date   APPENDECTOMY     age [age]   BREAST EXCISIONAL BIOPSY Left 08/2019   BREAST LUMPECTOMY Left 2008   left breast   CHOLECYSTECTOMY  2000   ENDOMETRIAL ABLATION  2009   LAPAROSCOPIC SALPINGO OOPHERECTOMY Left 1996   with right tubal ligation   RADIOACTIVE SEED GUIDED EXCISIONAL BREAST BIOPSY Left 09/13/2019   Procedure: RADIOACTIVE SEED GUIDED EXCISIONAL LEFT BREAST BIOPSY;  Surgeon: [name], MD;  Location: [facility];  Service: General;  Laterality: Left;   TUBAL LIGATION     Family History Family History  Problem Relation Age of Onset   Diabetes Mother    Colon cancer Father    Breast cancer Maternal Aunt    Ovarian cancer Maternal Aunt    Ovarian cancer Cousin    Colon cancer Cousin    Melanoma Cousin    Breast cancer Maternal Grandmother    Cancer Other        breast and ovarian-maternal side    Social History Social History   Tobacco Use   Smoking status: Never   Smokeless tobacco: Never   Tobacco comments:    NEVER USED TOBACCO  Vaping Use   Vaping Use: Never used  Substance Use Topics   Alcohol use: Yes    Alcohol/week: 1.0 standard drink of alcohol    Types: 1 Standard drinks or equivalent per week   Drug use: No   Allergies Latex, Lidocaine, Peanut-containing drug products, Statins, Sulfa antibiotics, Metformin and related, Reglan [metoclopramide], Adhesive [tape], Ciprofloxacin, and Elemental  sulfur  Review of Systems Review of Systems  Respiratory:  Positive for cough and chest tightness.     Physical Exam Vital Signs  I have reviewed the triage vital signs BP 116/74   Pulse (!) 112   Temp 98.2 F (36.8 C)   Resp (!) 40   Wt 83.7 kg   LMP 04/27/2017  Comment: spotting  SpO2 100%   BMI 31.67 kg/m   Physical Exam Vitals and nursing note reviewed.  Constitutional:      General: She is not in acute distress.    Appearance: She is well-developed.  HENT:     Head: Normocephalic and atraumatic.  Eyes:     Conjunctiva/sclera: Conjunctivae normal.  Cardiovascular:     Rate and Rhythm: Normal rate and regular rhythm.     Heart sounds: No murmur heard. Pulmonary:     Effort: Pulmonary effort is normal. No respiratory distress.     Breath sounds: Normal breath sounds.  Abdominal:     Palpations: Abdomen is soft.     Tenderness: There is no abdominal tenderness.  Musculoskeletal:        General: No swelling.     Cervical back: Neck supple.  Skin:    General: Skin is warm and dry.     Capillary Refill: Capillary refill takes less than 2 seconds.  Neurological:     Mental Status: She is alert.  Psychiatric:        Mood and Affect: Mood normal.     ED Results and Treatments Labs (all labs ordered are listed, but only abnormal results are displayed) Labs Reviewed  RESP PANEL BY RT-PCR (FLU A&B, COVID) ARPGX2                                                                                                                          Radiology DG Chest 2 View  Result Date: 06/20/2022 CLINICAL DATA:  r/o PNA EXAM: CHEST - 2 VIEW COMPARISON:  03/29/2021 FINDINGS: Cardiac silhouette is unremarkable. No pneumothorax or pleural effusion. The lungs are clear. The visualized skeletal structures are unremarkable. IMPRESSION: No acute cardiopulmonary process. Electronically Signed   By: Sammie Bench M.D.   On: 06/20/2022 09:57    Pertinent labs & imaging results that  were available during my care of the patient were reviewed by me and considered in my medical decision making (see MDM for details).  Medications Ordered in ED Medications  albuterol (VENTOLIN HFA) 108 (90 Base) MCG/ACT inhaler 1 puff (1 puff Inhalation Given 06/20/22 0922)  benzonatate (TESSALON) capsule 200 mg (200 mg Oral Given 06/20/22 0921)  ipratropium-albuterol (DUONEB) 0.5-2.5 (3) MG/3ML nebulizer solution 3 mL (3 mLs Nebulization Given 06/20/22 1003)  Procedures Procedures  (including critical care time)  Medical Decision Making / ED Course   This patient presents to the ED for concern of cough, shortness of breath, this involves an extensive number of treatment options, and is a complaint that carries with it a high risk of complications and morbidity.  The differential diagnosis includes pneumonia, upper respiratory infection, COVID-19, influenza, reactive airway disease, asthma  MDM: Patient seen emerged part for evaluation of cough and shortness of breath.  Physical exam reveals no appreciable wheezing but patient cannot take a deep breath without coughing.  Chest x-ray reassuringly negative.  COVID and flu negative.  Patient received an albuterol puff with improvement of symptoms and thus received a DuoNeb shortly after.  On reevaluation, symptoms significantly improved with the addition of Tessalon Perles.  She has no chest pain here in the emergency department and is not endorsing any shortness of breath at this time.  Suspect upper respiratory infection with a component of reactive airway disease and patient will be discharged with a albuterol inhaler and spacer, Medrol Dosepak, Tessalon Perles and cough syrup.  I have very low suspicion for ACS at this time as she has no chest pain or appreciable shortness of breath and dyspnea only appears to be  present in the setting of coughing which we have improved here in the ER.  Thus, additional laboratory workup deferred. she was given return precautions which she voiced understanding and she was discharged.  Vital signs at discharge with tachycardia secondary to albuterol and tachypnea secondary to coughing fits.  Patient has no increased work of breathing at time of discharge.   Additional history obtained: -Additional history obtained from husband -External records from outside source obtained and reviewed including: Chart review including previous notes, labs, imaging, consultation notes   Lab Tests: -I ordered, reviewed, and interpreted labs.   The pertinent results include:   Labs Reviewed  RESP PANEL BY RT-PCR (FLU A&B, COVID) ARPGX2      EKG   EKG Interpretation  Date/Time:  Friday June 20 2022 08:56:05 EST Ventricular Rate:  98 PR Interval:  141 QRS Duration: 84 QT Interval:  363 QTC Calculation: 464 R Axis:   110 Text Interpretation: Sinus rhythm Right axis deviation Confirmed by Hammond ([id number]) on 06/20/2022 9:10:29 AM         Imaging Studies ordered: I ordered imaging studies including CXR I independently visualized and interpreted imaging. I agree with the radiologist interpretation   Medicines ordered and prescription drug management: Meds ordered this encounter  Medications   albuterol (VENTOLIN HFA) 108 (90 Base) MCG/ACT inhaler 1 puff   benzonatate (TESSALON) capsule 200 mg   ipratropium-albuterol (DUONEB) 0.5-2.5 (3) MG/3ML nebulizer solution 3 mL   methylPREDNISolone (MEDROL DOSEPAK) 4 MG TBPK tablet    Sig: Take as prescribed    Dispense:  1 each    Refill:  0   benzonatate (TESSALON) 100 MG capsule    Sig: Take 1 capsule (100 mg total) by mouth every 8 (eight) hours.    Dispense:  21 capsule    Refill:  0   promethazine-dextromethorphan (PROMETHAZINE-DM) 6.25-15 MG/5ML syrup    Sig: Take 2.5 mLs by mouth 4 (four) times daily as  needed for cough.    Dispense:  118 mL    Refill:  0    -I have reviewed the patients home medicines and have made adjustments as needed  Critical interventions none    Cardiac Monitoring: The patient was maintained on a  cardiac monitor.  I personally viewed and interpreted the cardiac monitored which showed an underlying rhythm of: NSR, sinus tachycardia  Social Determinants of Health:  Factors impacting patients care include: none   Reevaluation: After the interventions noted above, I reevaluated the patient and found that they have :improved  Co morbidities that complicate the patient evaluation  Past Medical History:  Diagnosis Date   Abnormal Pap smear of cervix    h/o colposcopy/biopsy   Allergy    Asthma    HX   Breast cancer ([place]) 2007   Cancer [facility]) 1996-1997   ovarian   Diabetes mellitus without complication ([place]) 04/2110    diagnosed by PCP, [name]    GERD (gastroesophageal reflux disease)    Hyperlipemia    on medication    PONV (postoperative nausea and vomiting)       Dispostion: I considered admission for this patient, but she currently does not meet inpatient criteria for admission and safer discharge with outpatient follow-up and strict return precautions which she voiced understanding.     Final Clinical Impression(s) / ED Diagnoses Final diagnoses:  Acute cough  Upper respiratory tract infection, unspecified type     '@PCDICTATION'$ @    Teressa Lower, MD 06/20/22 1159

## 2022-06-20 NOTE — ED Triage Notes (Signed)
Patient with reported onset of cough and cold sx on yesterday.  Her cough has progressively worsened overnight.  She has frequent cough.  Patient denies known fever.  She has taken a tylenol cough/cold medication this morning.  Patient states it feels like pressure in her chest.  Patient throat is red but no exudate noted.  She is alert and oriented.

## 2022-06-20 NOTE — ED Notes (Signed)
Patient instruction given for [facility] use with aerochamber.

## 2022-06-20 NOTE — Telephone Encounter (Signed)
Patient went to ER>

## 2022-06-23 ENCOUNTER — Other Ambulatory Visit (HOSPITAL_BASED_OUTPATIENT_CLINIC_OR_DEPARTMENT_OTHER): Payer: Self-pay | Admitting: Obstetrics & Gynecology

## 2022-06-23 DIAGNOSIS — D271 Benign neoplasm of left ovary: Secondary | ICD-10-CM

## 2022-06-23 DIAGNOSIS — Z9189 Other specified personal risk factors, not elsewhere classified: Secondary | ICD-10-CM

## 2022-06-23 DIAGNOSIS — Z803 Family history of malignant neoplasm of breast: Secondary | ICD-10-CM

## 2022-06-26 ENCOUNTER — Ambulatory Visit: Payer: 59 | Admitting: Family

## 2022-06-26 ENCOUNTER — Encounter: Payer: Self-pay | Admitting: Family

## 2022-06-26 VITALS — BP 118/62 | HR 92 | Temp 98.0°F | Resp 18 | Ht 64.0 in | Wt 176.8 lb

## 2022-06-26 DIAGNOSIS — J209 Acute bronchitis, unspecified: Secondary | ICD-10-CM | POA: Diagnosis not present

## 2022-06-26 NOTE — Progress Notes (Signed)
Carolyn Wilson is a 51 y.o. female with the following history as recorded in Epic[employer]:  Patient Active Problem List   Diagnosis Date Noted   Anal skin tag 04/12/2021   Fascial hernia 11/29/2020   Wrist sprain, left, subsequent encounter 08/14/2020   Dyslipidemia 07/31/2020   Type 2 diabetes mellitus without complication, without long-term current use of insulin ([place]) 07/31/2020   Tachycardia 08/05/2017   Right shoulder pain 12/18/2016   Wellness examination 11/21/2015   Diabetes ([place]) 09/04/2015   Allergy with anaphylaxis due to peanuts 08/13/2014   Abnormal Pap smear of cervix  remote Leep  1996 per pt 08/13/2014   Allergic rhinitis 08/13/2014   S/P endometrial ablation 08/13/2014   GERD (gastroesophageal reflux disease) 08/13/2014   Diarrhea post cholecystectomy since 1999 08/13/2014   FHx: colon cancer  father  08/13/2014   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 21 capsule 0   ezetimibe (ZETIA) 10 MG tablet Take 1 tablet (10 mg total) by mouth daily. 90 tablet 3   fluticasone (FLONASE) 50 MCG/ACT nasal spray USE TWO SPRAYS IN EACH NOSTRIL ONE TIME DAILY 16 mL 11   gabapentin (NEURONTIN) 300 MG capsule TAKE ONE CAPSULE BY MOUTH ONE TIME DAILY AT BEDTIME 90 capsule 4   glucose blood (ONETOUCH VERIO) test strip USE TO TEST ONE TIME DAILY AS DIRECTED 100 strip 11   Insulin Glargine (BASAGLAR KWIKPEN) 100 UNIT/ML 22 Units daily.     Insulin Pen Needle (BD PEN NEEDLE NANO U/F) 32G X 4 MM MISC Inject insulin once daily E11.65     Lancets (ONETOUCH ULTRASOFT) lancets Check blood sugar daily as directed. 100 each 12   letrozole (FEMARA) 2.5 MG tablet Take 1 tablet (2.5 mg  total) by mouth daily. 30 tablet 6   metoprolol tartrate (LOPRESSOR) 25 MG tablet TAKE ONE TABLET BY MOUTH TWICE A DAY 180 tablet 3   NONFORMULARY OR COMPOUNDED ITEM Vitamin E vaginal suppositories 200u/ml.  One pv three times weekly. 36 each 3   ondansetron (ZOFRAN-ODT) 8 MG disintegrating tablet DISSOLVE ONE TABLET BY MOUTH EVERY 8 HOURS AS NEEDED FOR NAUSEA AND VOMITING 20 tablet 0   promethazine-dextromethorphan (PROMETHAZINE-DM) 6.25-15 MG/5ML syrup Take 2.5 mLs by mouth 4 (four) times daily as needed for cough. 118 mL 0   RABEprazole (ACIPHEX) 20 MG tablet Take 1 tablet by mouth in the morning and at bedtime.     vitamin C (ASCORBIC ACID) 500 MG tablet Take 500 mg by mouth daily.     pyridostigmine (MESTINON) 60 MG tablet Take by mouth.     No current facility-administered medications for this visit.    Allergies: Latex, Lidocaine, Peanut-containing drug products, Statins, Sulfa antibiotics, Metformin and related, Reglan [metoclopramide], Adhesive [tape], Ciprofloxacin, and Elemental sulfur  Past Medical History:  Diagnosis Date   Abnormal Pap smear of cervix    h/o colposcopy/biopsy   Allergy    Asthma    HX   Breast cancer ([place]) 2007   Cancer [facility]) [phone]   ovarian   Diabetes mellitus without complication ([place]) 41/6606    diagnosed by PCP, [name]    GERD (gastroesophageal reflux disease)    Hyperlipemia    on medication  PONV (postoperative nausea and vomiting)     Past Surgical History:  Procedure Laterality Date   APPENDECTOMY     age [age]   BREAST EXCISIONAL BIOPSY Left 08/2019   BREAST LUMPECTOMY Left 2008   left breast   CHOLECYSTECTOMY  2000   ENDOMETRIAL ABLATION  2009   LAPAROSCOPIC SALPINGO OOPHERECTOMY Left 1996   with right tubal ligation   RADIOACTIVE SEED GUIDED EXCISIONAL BREAST BIOPSY Left 09/13/2019   Procedure: RADIOACTIVE SEED GUIDED EXCISIONAL LEFT BREAST BIOPSY;  Surgeon: [name], MD;  Location: [facility];   Service: General;  Laterality: Left;   TUBAL LIGATION      Family History  Problem Relation Age of Onset   Diabetes Mother    Colon cancer Father    Breast cancer Maternal Aunt    Ovarian cancer Maternal Aunt    Ovarian cancer Cousin    Colon cancer Cousin    Melanoma Cousin    Breast cancer Maternal Grandmother    Cancer Other        breast and ovarian-maternal side    Social History   Tobacco Use   Smoking status: Never   Smokeless tobacco: Never   Tobacco comments:    NEVER USED TOBACCO  Substance Use Topics   Alcohol use: Yes    Alcohol/week: 1.0 standard drink of alcohol    Types: 1 Standard drinks or equivalent per week    Subjective:   Follow up on ER visit for SOB/ acute bronchitis; had normal CXR; has completed steroid pack and feeling better today; still has lingering cough but in general feeling back to herself;     Objective:  Vitals:   06/26/22 0852  BP: 118/62  Pulse: 92  Resp: 18  Temp: 98 F (36.7 C)  TempSrc: Oral  SpO2: 98%  Weight: 176 lb 12.8 oz (80.2 kg)  Height: '5\' 4"'$  (1.626 m)    General: Well developed, well nourished, in no acute distress  Skin : Warm and dry.  Head: Normocephalic and atraumatic  Eyes: Sclera and conjunctiva clear; pupils round and reactive to light; extraocular movements intact  Ears: External normal; canals clear; tympanic membranes normal  Oropharynx: Pink, supple. No suspicious lesions  Neck: Supple without thyromegaly, adenopathy  Lungs: Respirations unlabored; clear to auscultation bilaterally without wheeze, rales, rhonchi  CVS exam: normal rate and regular rhythm.  Neurologic: Alert and oriented; speech intact; face symmetrical; moves all extremities well; CNII-XII intact without focal deficit   Assessment:  1. Acute bronchitis, unspecified organism     Plan:  Physical exam is reassuring; patient understands that it may take up to 4-5 weeks for cough to resolve completely; she can continue to use albuterol  as needed.  Follow up prn otherwise.   No follow-ups on file.  No orders of the defined types were placed in this encounter.   Requested Prescriptions    No prescriptions requested or ordered in this encounter

## 2022-06-29 ENCOUNTER — Encounter: Payer: Self-pay | Admitting: Family

## 2022-06-30 ENCOUNTER — Other Ambulatory Visit: Payer: Self-pay | Admitting: Family

## 2022-06-30 MED ORDER — BENZONATATE 100 MG PO CAPS: 100.0000 mg | ORAL_CAPSULE | Freq: Three times a day (TID) | ORAL | 0 refills | Status: DC

## 2022-07-10 ENCOUNTER — Encounter (HOSPITAL_BASED_OUTPATIENT_CLINIC_OR_DEPARTMENT_OTHER): Payer: Self-pay | Admitting: Obstetrics & Gynecology

## 2022-07-11 ENCOUNTER — Other Ambulatory Visit (HOSPITAL_BASED_OUTPATIENT_CLINIC_OR_DEPARTMENT_OTHER): Payer: Self-pay | Admitting: *Deleted

## 2022-07-11 MED ORDER — GABAPENTIN 300 MG PO CAPS: ORAL_CAPSULE | ORAL | 0 refills | Status: DC

## 2022-07-18 ENCOUNTER — Ambulatory Visit
Admission: RE | Admit: 2022-07-18 | Discharge: 2022-07-18 | Disposition: A | Discharge status: Home / Self Care | Payer: 59 | Source: Ambulatory Visit | Attending: Obstetrics & Gynecology | Admitting: Obstetrics & Gynecology

## 2022-07-18 DIAGNOSIS — Z9189 Other specified personal risk factors, not elsewhere classified: Secondary | ICD-10-CM

## 2022-07-18 DIAGNOSIS — D271 Benign neoplasm of left ovary: Secondary | ICD-10-CM

## 2022-07-18 DIAGNOSIS — Z803 Family history of malignant neoplasm of breast: Secondary | ICD-10-CM

## 2022-07-18 MED ORDER — GADOPICLENOL 0.5 MMOL/ML IV SOLN
7.0000 mL | Freq: Once | INTRAVENOUS | Status: AC | PRN
  Administered 2022-07-18: 7 mL via INTRAVENOUS

## 2022-07-22 ENCOUNTER — Ambulatory Visit (INDEPENDENT_AMBULATORY_CARE_PROVIDER_SITE_OTHER): Payer: 59 | Admitting: Obstetrics & Gynecology

## 2022-07-22 ENCOUNTER — Encounter (HOSPITAL_BASED_OUTPATIENT_CLINIC_OR_DEPARTMENT_OTHER): Payer: Self-pay | Admitting: Obstetrics & Gynecology

## 2022-07-22 VITALS — BP 128/80 | HR 75 | Ht 64.5 in | Wt 176.2 lb

## 2022-07-22 DIAGNOSIS — N898 Other specified noninflammatory disorders of vagina: Secondary | ICD-10-CM

## 2022-07-22 DIAGNOSIS — Z9189 Other specified personal risk factors, not elsewhere classified: Secondary | ICD-10-CM

## 2022-07-22 DIAGNOSIS — R232 Flushing: Secondary | ICD-10-CM | POA: Diagnosis not present

## 2022-07-22 DIAGNOSIS — Z01419 Encounter for gynecological examination (general) (routine) without abnormal findings: Secondary | ICD-10-CM | POA: Diagnosis not present

## 2022-07-22 DIAGNOSIS — Z9889 Other specified postprocedural states: Secondary | ICD-10-CM | POA: Diagnosis not present

## 2022-07-22 DIAGNOSIS — D271 Benign neoplasm of left ovary: Secondary | ICD-10-CM

## 2022-07-22 DIAGNOSIS — Z803 Family history of malignant neoplasm of breast: Secondary | ICD-10-CM

## 2022-07-22 DIAGNOSIS — Z800 Family history of malignant neoplasm of digestive organs: Secondary | ICD-10-CM

## 2022-07-22 MED ORDER — GABAPENTIN 300 MG PO CAPS: ORAL_CAPSULE | ORAL | 3 refills | Status: DC

## 2022-07-22 MED ORDER — NONFORMULARY OR COMPOUNDED ITEM: 3 refills | Status: DC

## 2022-07-22 NOTE — Progress Notes (Unsigned)
52 y.o. G82P0100 Married White or Caucasian female here for annual exam.  Having a lot of stressors with her mother.  She was in the CCU for three weeks but then left again medical advice.  She lives in [place], [place].  Denies vaginal bleeding.    Patient's last menstrual period was 04/27/2017.          Sexually active: Yes.    The current method of family planning is post menopausal status.   .  Exercising: No.   Smoker:  no  Health Maintenance: Pap:  05/25/2020 Negative History of abnormal Pap:  remote hx MMG:  02/05/2022 Negative.  MRI was negative 06/2022 Colonoscopy:  10/17/2021.  Had hamartoma present.  Did see neurology for this. BMD:   05/31/2020 Normal Screening Labs: reviewed in Care Everywhere   reports that she has never smoked. She has never used smokeless tobacco. She reports current alcohol use of about 1.0 standard drink of alcohol per week. She reports that she does not use drugs.  Past Medical History:  Diagnosis Date   Abnormal Pap smear of cervix    h/o colposcopy/biopsy   Allergy    Asthma    HX   Breast cancer ([place]) 2007   Cancer [facility]) [phone]   ovarian   Diabetes mellitus without complication ([place]) 26/3335    diagnosed by PCP, [name]    GERD (gastroesophageal reflux disease)    Hyperlipemia    on medication    PONV (postoperative nausea and vomiting)     Past Surgical History:  Procedure Laterality Date   APPENDECTOMY     age [age]   BREAST EXCISIONAL BIOPSY Left 08/2019   BREAST LUMPECTOMY Left 2008   left breast   CHOLECYSTECTOMY  2000   ENDOMETRIAL ABLATION  2009   LAPAROSCOPIC SALPINGO OOPHERECTOMY Left 1996   with right tubal ligation   RADIOACTIVE SEED GUIDED EXCISIONAL BREAST BIOPSY Left 09/13/2019   Procedure: RADIOACTIVE SEED GUIDED EXCISIONAL LEFT BREAST BIOPSY;  Surgeon: [name], MD;  Location: [facility];  Service: General;  Laterality: Left;   TUBAL LIGATION      Current Outpatient Medications   Medication Sig Dispense Refill   ezetimibe (ZETIA) 10 MG tablet Take 1 tablet (10 mg total) by mouth daily. 90 tablet 3   glucose blood (ONETOUCH VERIO) test strip USE TO TEST ONE TIME DAILY AS DIRECTED 100 strip 11   Insulin Glargine (BASAGLAR KWIKPEN) 100 UNIT/ML 22 Units daily.     Insulin Pen Needle (BD PEN NEEDLE NANO U/F) 32G X 4 MM MISC Inject insulin once daily E11.65     Lancets (ONETOUCH ULTRASOFT) lancets Check blood sugar daily as directed. 100 each 12   letrozole (FEMARA) 2.5 MG tablet Take 1 tablet (2.5 mg total) by mouth daily. 30 tablet 6   metoprolol tartrate (LOPRESSOR) 25 MG tablet TAKE ONE TABLET BY MOUTH TWICE A DAY 180 tablet 3   ondansetron (ZOFRAN-ODT) 8 MG disintegrating tablet DISSOLVE ONE TABLET BY MOUTH EVERY 8 HOURS AS NEEDED FOR NAUSEA AND VOMITING 20 tablet 0   promethazine-dextromethorphan (PROMETHAZINE-DM) 6.25-15 MG/5ML syrup Take 2.5 mLs by mouth 4 (four) times daily as needed for cough. 118 mL 0   pyridostigmine (MESTINON) 60 MG tablet Take by mouth.     RABEprazole (ACIPHEX) 20 MG tablet Take 1 tablet by mouth in the morning and at bedtime.     vitamin C (ASCORBIC ACID) 500 MG tablet Take 500 mg by mouth daily.     gabapentin (  NEURONTIN) 300 MG capsule TAKE ONE CAPSULE BY MOUTH ONE TIME DAILY AT BEDTIME 90 capsule 3   NONFORMULARY OR COMPOUNDED ITEM Vitamin E vaginal suppositories 200u/ml.  One pv three times weekly. 36 each 3   No current facility-administered medications for this visit.    Family History  Problem Relation Age of Onset   Diabetes Mother    Colon cancer Father    Breast cancer Maternal Aunt    Ovarian cancer Maternal Aunt    Ovarian cancer Cousin    Colon cancer Cousin    Melanoma Cousin    Breast cancer Maternal Grandmother    Cancer Other        breast and ovarian-maternal side    ROS: Constitutional: negative Genitourinary:negative  Exam:   BP 128/80 (BP Location: Right Arm, Patient Position: Sitting, Cuff Size: Large)    Pulse 75   Ht 5' 4.5" (1.638 m) Comment: REported  Wt 176 lb 3.2 oz (79.9 kg)   LMP 04/27/2017 Comment: spotting  BMI 29.78 kg/m   Height: 5' 4.5" (163.8 cm) (REported)  General appearance: alert, cooperative and appears stated age Head: Normocephalic, without obvious abnormality, atraumatic Neck: no adenopathy, supple, symmetrical, trachea midline and thyroid normal to inspection and palpation Lungs: clear to auscultation bilaterally Breasts: normal appearance, no masses or tenderness Heart: regular rate and rhythm Abdomen: soft, non-tender; bowel sounds normal; no masses,  no organomegaly Extremities: extremities normal, atraumatic, no cyanosis or edema Skin: Skin color, texture, turgor normal. No rashes or lesions Lymph nodes: Cervical, supraclavicular, and axillary nodes normal. No abnormal inguinal nodes palpated Neurologic: Grossly normal   Pelvic: External genitalia:  no lesions              Urethra:  normal appearing urethra with no masses, tenderness or lesions              Bartholins and Skenes: normal                 Vagina: normal appearing vagina with normal color and no discharge, no lesions              Cervix: no lesions              Pap taken: No. Bimanual Exam:  Uterus:  normal size, contour, position, consistency, mobility, non-tender              Adnexa: normal adnexa and no mass, fullness, tenderness               Rectovaginal: Confirms               Anus:  normal sphincter tone, no lesions  Chaperone, Octaviano Batty, CMA, was present for exam.  Assessment/Plan:

## 2022-07-24 DIAGNOSIS — Z9189 Other specified personal risk factors, not elsewhere classified: Secondary | ICD-10-CM | POA: Insufficient documentation

## 2022-07-24 HISTORY — DX: Other specified personal risk factors, not elsewhere classified: Z91.89

## 2022-08-08 ENCOUNTER — Encounter: Payer: Self-pay | Admitting: Family

## 2022-08-12 ENCOUNTER — Encounter: Payer: Self-pay | Admitting: Family

## 2022-08-12 ENCOUNTER — Ambulatory Visit: Payer: 59 | Admitting: Family

## 2022-08-12 ENCOUNTER — Ambulatory Visit (HOSPITAL_BASED_OUTPATIENT_CLINIC_OR_DEPARTMENT_OTHER)
Admission: RE | Admit: 2022-08-12 | Discharge: 2022-08-12 | Disposition: A | Discharge status: Home / Self Care | Payer: 59 | Source: Ambulatory Visit | Attending: Family | Admitting: Family

## 2022-08-12 VITALS — BP 122/81 | HR 79 | Ht 64.5 in | Wt 177.2 lb

## 2022-08-12 DIAGNOSIS — R0789 Other chest pain: Secondary | ICD-10-CM | POA: Insufficient documentation

## 2022-08-12 LAB — COMPREHENSIVE METABOLIC PANEL
ALT: 42 U/L — ABNORMAL HIGH (ref 0–35)
AST: 33 U/L (ref 0–37)
Albumin: 4.6 g/dL (ref 3.5–5.2)
Alkaline Phosphatase: 93 U/L (ref 39–117)
BUN: 15 mg/dL (ref 6–23)
CO2: 30 mEq/L (ref 19–32)
Calcium: 10.1 mg/dL (ref 8.4–10.5)
Chloride: 100 mEq/L (ref 96–112)
Creatinine, Ser: 0.97 mg/dL (ref 0.40–1.20)
GFR: 67.83 mL/min (ref >60.00)
Glucose, Bld: 132 mg/dL — ABNORMAL HIGH (ref 70–99)
Potassium: 4.5 mEq/L (ref 3.5–5.1)
Sodium: 140 mEq/L (ref 135–145)
Total Bilirubin: 0.5 mg/dL (ref 0.2–1.2)
Total Protein: 7.4 g/dL (ref 6.0–8.3)

## 2022-08-12 LAB — CBC WITH DIFFERENTIAL/PLATELET
Basophils Absolute: 0 10*3/uL (ref 0.0–0.1)
Basophils Relative: 0.4 % (ref 0.0–3.0)
Eosinophils Absolute: 0.2 10*3/uL (ref 0.0–0.7)
Eosinophils Relative: 2.7 % (ref 0.0–5.0)
HCT: 45.2 % (ref 36.0–46.0)
Hemoglobin: 15.1 g/dL — ABNORMAL HIGH (ref 12.0–15.0)
Lymphocytes Relative: 29 % (ref 12.0–46.0)
Lymphs Abs: 2.4 10*3/uL (ref 0.7–4.0)
MCHC: 33.4 g/dL (ref 30.0–36.0)
MCV: 89.5 fl (ref 78.0–100.0)
Monocytes Absolute: 0.4 10*3/uL (ref 0.1–1.0)
Monocytes Relative: 4.6 % (ref 3.0–12.0)
Neutro Abs: 5.1 10*3/uL (ref 1.4–7.7)
Neutrophils Relative %: 63.3 % (ref 43.0–77.0)
Platelets: 338 10*3/uL (ref 150.0–400.0)
RBC: 5.04 Mil/uL (ref 3.87–5.11)
RDW: 13.5 % (ref 11.5–15.5)
WBC: 8.1 10*3/uL (ref 4.0–10.5)

## 2022-08-12 LAB — D-DIMER, QUANTITATIVE: D-Dimer, Quant: 0.3 mcg/mL FEU (ref <0.50)

## 2022-08-12 MED ORDER — MELOXICAM 15 MG PO TABS: 15.0000 mg | ORAL_TABLET | Freq: Every day | ORAL | 0 refills | Status: DC

## 2022-08-12 NOTE — Progress Notes (Signed)
Carolyn Wilson is a 52 y.o. female with the following history as recorded in EpicCare:  Patient Active Problem List   Diagnosis Date Noted   Increased risk of breast cancer 07/24/2022   Anal skin tag 04/12/2021   Fascial hernia 11/29/2020   Wrist sprain, left, subsequent encounter 08/14/2020   Dyslipidemia 07/31/2020   Type 2 diabetes mellitus without complication, without long-term current use of insulin ([place]) 07/31/2020   Tachycardia 08/05/2017   Right shoulder pain 12/18/2016   Wellness examination 11/21/2015   Diabetes ([place]) 09/04/2015   Allergy with anaphylaxis due to peanuts 08/13/2014   Abnormal Pap smear of cervix  remote [place]  1996 per pt 08/13/2014   Allergic rhinitis 08/13/2014   S/P endometrial ablation 08/13/2014   GERD (gastroesophageal reflux disease) 08/13/2014   Diarrhea post cholecystectomy since 1999 08/13/2014   FHx: colon cancer  father  08/13/2014   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 30 tablet 0   pantoprazole (PROTONIX) 40 MG tablet Take 1 tablet by mouth daily.     ezetimibe (ZETIA) 10 MG tablet Take 1 tablet (10 mg total) by mouth daily. 90 tablet 3   gabapentin (NEURONTIN) 300 MG capsule TAKE ONE CAPSULE BY MOUTH ONE TIME DAILY AT BEDTIME 90 capsule 3   glucose blood (ONETOUCH VERIO) test strip USE TO TEST ONE TIME DAILY AS DIRECTED 100 strip 11   Insulin Glargine (BASAGLAR KWIKPEN) 100 UNIT/ML 22 Units daily.     Insulin Pen Needle (BD PEN NEEDLE NANO U/F) 32G X 4 MM MISC Inject insulin once daily E11.65     Lancets (ONETOUCH ULTRASOFT) lancets Check blood sugar daily as directed. 100 each 12   letrozole (FEMARA) 2.5 MG tablet Take 1 tablet (2.5 mg total) by  mouth daily. 30 tablet 6   metoprolol tartrate (LOPRESSOR) 25 MG tablet TAKE ONE TABLET BY MOUTH TWICE A DAY 180 tablet 3   NONFORMULARY OR COMPOUNDED ITEM Vitamin E vaginal suppositories 200u/ml.  One pv three times weekly. 36 each 3   ondansetron (ZOFRAN-ODT) 8 MG disintegrating tablet DISSOLVE ONE TABLET BY MOUTH EVERY 8 HOURS AS NEEDED FOR NAUSEA AND VOMITING 20 tablet 0   pyridostigmine (MESTINON) 60 MG tablet Take by mouth.     RABEprazole (ACIPHEX) 20 MG tablet Take 1 tablet by mouth in the morning and at bedtime.     vitamin C (ASCORBIC ACID) 500 MG tablet Take 500 mg by mouth daily.     No current facility-administered medications for this visit.    Allergies: Latex, Lidocaine, Peanut-containing drug products, Statins, Sulfa antibiotics, Metformin and related, Reglan [metoclopramide], Adhesive [tape], Ciprofloxacin, and Elemental sulfur  Past Medical History:  Diagnosis Date   Abnormal Pap smear of cervix    h/o colposcopy/biopsy   Allergy    Asthma    HX   Breast cancer ([place]) 2007   Cancer [facility]) 1996-1997   ovarian   Diabetes mellitus without complication ([place]) 03/4708    diagnosed by PCP, [name]    GERD (gastroesophageal reflux disease)    Hyperlipemia    on medication    PONV (postoperative nausea and vomiting)     Past Surgical History:  Procedure Laterality Date   APPENDECTOMY  age [age]   BREAST EXCISIONAL BIOPSY Left 08/2019   BREAST LUMPECTOMY Left 2008   left breast   CHOLECYSTECTOMY  2000   ENDOMETRIAL ABLATION  2009   LAPAROSCOPIC SALPINGO OOPHERECTOMY Left 1996   with right tubal ligation   RADIOACTIVE SEED GUIDED EXCISIONAL BREAST BIOPSY Left 09/13/2019   Procedure: RADIOACTIVE SEED GUIDED EXCISIONAL LEFT BREAST BIOPSY;  Surgeon: [name], MD;  Location: [facility];  Service: General;  Laterality: Left;   TUBAL LIGATION      Family History  Problem Relation Age of Onset   Diabetes Mother    Colon cancer Father    Breast  cancer Maternal Aunt    Ovarian cancer Maternal Aunt    Ovarian cancer Cousin    Colon cancer Cousin    Melanoma Cousin    Breast cancer Maternal Grandmother    Cancer Other        breast and ovarian-maternal side    Social History   Tobacco Use   Smoking status: Never   Smokeless tobacco: Never   Tobacco comments:    NEVER USED TOBACCO  Substance Use Topics   Alcohol use: Yes    Alcohol/week: 1.0 standard drink of alcohol    Types: 1 Standard drinks or equivalent per week    Subjective:   Complaining of localized sore spot in center of chest that has been present x 2 months; seemed to have started after bronchitis in early December; does feel sore to touch; no chest pain or shortness of breath; more noticeable with deep inhalation;   Objective:  Vitals:   08/12/22 0920  BP: 122/81  Pulse: 79  SpO2: 98%  Weight: 177 lb 3.2 oz (80.4 kg)  Height: 5' 4.5" (1.638 m)    General: Well developed, well nourished, in no acute distress  Skin : Warm and dry.  Head: Normocephalic and atraumatic  Eyes: Sclera and conjunctiva clear; pupils round and reactive to light; extraocular movements intact  Ears: External normal; canals clear; tympanic membranes normal  Oropharynx: Pink, supple. No suspicious lesions  Neck: Supple without thyromegaly, adenopathy  Lungs: Respirations unlabored; clear to auscultation bilaterally without wheeze, rales, rhonchi  CVS exam: normal rate and regular rhythm.  Neurologic: Alert and oriented; speech intact; face symmetrical; moves all extremities well; CNII-XII intact without focal deficit   Assessment:  1. Atypical chest pain     Plan:  Suspect muscular; update d-dimer; update CXR/ bilateral rib X-rays; Rx for Mobic 15 mg; follow up to be determined.  No follow-ups on file.  Orders Placed This Encounter  Procedures   DG Ribs Bilateral W/Chest    Standing Status:   Future    Number of Occurrences:   1    Standing Expiration Date:   08/13/2023     Order Specific Question:   Reason for Exam (SYMPTOM  OR DIAGNOSIS REQUIRED)    Answer:   atypical chest pain    Order Specific Question:   Is patient pregnant?    Answer:   No    Order Specific Question:   Preferred imaging location?    Answer:   [facility]   D-Dimer, Quantitative   CBC with Differential/Platelet   Comp Met (CMET)    Requested Prescriptions   Signed Prescriptions Disp Refills   meloxicam (MOBIC) 15 MG tablet 30 tablet 0    Sig: Take 1 tablet (15 mg total) by mouth daily.

## 2022-08-14 ENCOUNTER — Telehealth: Payer: Self-pay | Admitting: Cardiology

## 2022-08-14 NOTE — Telephone Encounter (Signed)
Pt c/o of Chest Pain: STAT if CP now or developed within 24 hours  1. Are you having CP right now? Yes - Cramps  2. Are you experiencing any other symptoms (ex. SOB, nausea, vomiting, sweating)? No  3. How long have you been experiencing CP? This morning  4. Is your CP continuous or coming and going? Come and go  5. Have you taken Nitroglycerin? No ?  Call transferred

## 2022-08-14 NOTE — Telephone Encounter (Signed)
Advised to go to the ED for evaluation as she has a hx of DM. Pt states she always has nausea/vomiting. Pt verbalized understanding and had no additional questions.

## 2022-08-16 ENCOUNTER — Other Ambulatory Visit: Payer: Self-pay | Admitting: Internal Medicine

## 2022-08-22 ENCOUNTER — Inpatient Hospital Stay: Payer: 59 | Admitting: Hematology & Oncology

## 2022-08-22 ENCOUNTER — Inpatient Hospital Stay: Payer: 59

## 2022-08-25 ENCOUNTER — Encounter: Payer: Self-pay | Admitting: Family Medicine

## 2022-08-25 ENCOUNTER — Telehealth: Payer: 59 | Admitting: Family Medicine

## 2022-08-25 DIAGNOSIS — U071 COVID-19: Secondary | ICD-10-CM

## 2022-08-25 MED ORDER — BENZONATATE 200 MG PO CAPS: 200.0000 mg | ORAL_CAPSULE | Freq: Two times a day (BID) | ORAL | 0 refills | Status: DC | PRN

## 2022-08-25 MED ORDER — NIRMATRELVIR/RITONAVIR (PAXLOVID)TABLET: 3.0000 | ORAL_TABLET | Freq: Two times a day (BID) | ORAL | 0 refills | Status: AC

## 2022-08-25 NOTE — Progress Notes (Signed)
Virtual Video Visit via MyChart Note  I connected with  Carolyn Wilson on 08/25/22 at  4:00 PM EST by the video enabled telemedicine application for MyChart, and verified that I am speaking with the correct person using two identifiers.   I introduced myself as a Designer, jewellery with the practice. We discussed the limitations of evaluation and management by telemedicine and the availability of in person appointments. The patient expressed understanding and agreed to proceed.  Participating parties in this visit include: The patient and the nurse practitioner listed.  The patient is: At home I am: In the office - [facility]:    CC:  Chief Complaint  Patient presents with   Covid Positive   Fatigue   sneezing   Headache   low grade fever    HPI: Carolyn Wilson is a 52 y.o. year old female presenting today via [facility] today for positive COVID test.   Patient reports she started feeling poorly on Saturday and had a positive home COVID test. She had multiple exposures from work. Her symptoms have included rhinorrhea, fatigue, sneezing, mild headaches and sinus pressure, occasional cough, low grade fevers. She has not had any chest pain, dyspnea, wheezing, GI/GU symptoms. She has not had COVID yet, but has received 4 vaccines.        Past medical history, Surgical history, Family history not pertinant except as noted below, Social history, Allergies, and medications have been entered into the medical record, reviewed, and corrections made.   Review of Systems:  All review of systems negative except what is listed in the HPI   Objective:    General:  Speaking clearly in complete sentences. Absent shortness of breath noted.   Alert and oriented x3.   Normal judgment.  Absent acute distress.   Impression and Recommendations:    1. COVID-19 - nirmatrelvir/ritonavir (PAXLOVID) 20 x 150 MG & 10 x '100MG'$  TABS; Take 3  tablets by mouth 2 (two) times daily for 5 days. (Take nirmatrelvir 150 mg two tablets twice daily for 5 days and ritonavir 100 mg one tablet twice daily for 5 days) Patient GFR is 67  Dispense: 30 tablet; Refill: 0 - benzonatate (TESSALON) 200 MG capsule; Take 1 capsule (200 mg total) by mouth 2 (two) times daily as needed for cough.  Dispense: 20 capsule; Refill: 0  Discussed options with patient. She would like to try Paxlovid. Information sheet added to AVS. Adding tessalon for cough. Continue supportive measures including rest, hydration, humidifier use, steam showers, warm compresses to sinuses, warm liquids with lemon and honey, and over-the-counter cough, cold, and analgesics as needed.  Patient aware of signs/symptoms requiring further/urgent evaluation.     Follow-up if symptoms worsen or fail to improve.    I discussed the assessment and treatment plan with the patient. The patient was provided an opportunity to ask questions and all were answered. The patient agreed with the plan and demonstrated an understanding of the instructions.   The patient was advised to call back or seek an in-person evaluation if the symptoms worsen or if the condition fails to improve as anticipated.     Carolyn Saver, NP

## 2022-08-25 NOTE — Patient Instructions (Signed)

## 2022-08-29 ENCOUNTER — Ambulatory Visit: Payer: 59 | Admitting: Hematology & Oncology

## 2022-08-29 ENCOUNTER — Inpatient Hospital Stay: Payer: 59

## 2022-09-01 ENCOUNTER — Encounter: Payer: Self-pay | Admitting: Family

## 2022-09-04 ENCOUNTER — Ambulatory Visit: Payer: 59 | Admitting: Cardiology

## 2022-09-08 ENCOUNTER — Encounter: Payer: Self-pay | Admitting: Hematology & Oncology

## 2022-09-08 ENCOUNTER — Inpatient Hospital Stay: Payer: 59 | Admitting: Hematology & Oncology

## 2022-09-08 ENCOUNTER — Inpatient Hospital Stay: Payer: 59 | Attending: Hematology & Oncology

## 2022-09-08 VITALS — BP 129/82 | Wt 178.2 lb

## 2022-09-08 DIAGNOSIS — N6092 Unspecified benign mammary dysplasia of left breast: Secondary | ICD-10-CM | POA: Diagnosis not present

## 2022-09-08 DIAGNOSIS — Z79811 Long term (current) use of aromatase inhibitors: Secondary | ICD-10-CM | POA: Insufficient documentation

## 2022-09-08 DIAGNOSIS — Z803 Family history of malignant neoplasm of breast: Secondary | ICD-10-CM | POA: Diagnosis not present

## 2022-09-08 DIAGNOSIS — Z8543 Personal history of malignant neoplasm of ovary: Secondary | ICD-10-CM | POA: Insufficient documentation

## 2022-09-08 DIAGNOSIS — Z8616 Personal history of COVID-19: Secondary | ICD-10-CM | POA: Diagnosis not present

## 2022-09-08 DIAGNOSIS — N6489 Other specified disorders of breast: Secondary | ICD-10-CM | POA: Insufficient documentation

## 2022-09-08 LAB — CMP (CANCER CENTER ONLY)
ALT: 38 U/L (ref 0–44)
AST: 26 U/L (ref 15–41)
Albumin: 4.5 g/dL (ref 3.5–5.0)
Alkaline Phosphatase: 88 U/L (ref 38–126)
Anion gap: 8 (ref 5–15)
BUN: 17 mg/dL (ref 6–20)
CO2: 32 mmol/L (ref 22–32)
Calcium: 10.3 mg/dL (ref 8.9–10.3)
Chloride: 101 mmol/L (ref 98–111)
Creatinine: 1 mg/dL (ref 0.44–1.00)
GFR, Estimated: >60 mL/min (ref >60)
Glucose, Bld: 108 mg/dL — ABNORMAL HIGH (ref 70–99)
Potassium: 4.2 mmol/L (ref 3.5–5.1)
Sodium: 141 mmol/L (ref 135–145)
Total Bilirubin: 0.4 mg/dL (ref 0.3–1.2)
Total Protein: 7.4 g/dL (ref 6.5–8.1)

## 2022-09-08 LAB — CBC WITH DIFFERENTIAL (CANCER CENTER ONLY)
Abs Immature Granulocytes: 0.03 10*3/uL (ref 0.00–0.07)
Basophils Absolute: 0 10*3/uL (ref 0.0–0.1)
Basophils Relative: 0 %
Eosinophils Absolute: 0.3 10*3/uL (ref 0.0–0.5)
Eosinophils Relative: 4 %
HCT: 41.7 % (ref 36.0–46.0)
Hemoglobin: 13.8 g/dL (ref 12.0–15.0)
Immature Granulocytes: 0 %
Lymphocytes Relative: 36 %
Lymphs Abs: 3.3 10*3/uL (ref 0.7–4.0)
MCH: 30.1 pg (ref 26.0–34.0)
MCHC: 33.1 g/dL (ref 30.0–36.0)
MCV: 90.8 fL (ref 80.0–100.0)
Monocytes Absolute: 0.5 10*3/uL (ref 0.1–1.0)
Monocytes Relative: 6 %
Neutro Abs: 5.1 10*3/uL (ref 1.7–7.7)
Neutrophils Relative %: 54 %
Platelet Count: 312 10*3/uL (ref 150–400)
RBC: 4.59 MIL/uL (ref 3.87–5.11)
RDW: 12.6 % (ref 11.5–15.5)
WBC Count: 9.3 10*3/uL (ref 4.0–10.5)
nRBC: 0 % (ref 0.0–0.2)

## 2022-09-08 LAB — LACTATE DEHYDROGENASE: LDH: 177 U/L (ref 98–192)

## 2022-09-08 NOTE — Progress Notes (Signed)
Hematology and Oncology Follow Up Visit  Carolyn Wilson [id number] [birth year] 52 y.o. 09/08/2022   Principle Diagnosis:  1. History of Sertoli-Leydig tumor of the left ovary  2. Strong family history of breast and ovarian cancer - BRCA 1/2 and p53 negative 3. 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 Complex Sclerosing Lesion with usual ductal hyperplasia of the LEFT breast (central)   Current Therapy:         Femara 2.5 po  q day - start on 10/31/2020   Interim History:  Carolyn Wilson is here today for follow-up.  Unfortunate, she has had a lot going on.  She had RSV I think right around Thanksgiving.  Then, she had COVID after she was with her mother in the hospital for 4 weeks.  Her mother was up in [place].  She apparently had a cardiac arrest.  She was put on a ventilator for 2 weeks and that was in the hospital 2 weeks after that.  As always, Carolyn Wilson has a great attitude.  She is still working.  She is quite busy at work.  She actually will be going down to [place], [place] for a an award that [employer] received.  She has had no problems with the Femara.  She has had no problem with hot flashes or sweats.  Her diabetes seems to be under better control.  She has had no problems with her skin.  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  This was found after a polyp was removed from her colon and was found to be a neurofibroma.  She has had no problems with bleeding.  She has had no issues with headache.  She has had no rashes.  She has had no swollen lymph nodes.  She has gastroparesis.  I think there is think about giving her a gastric "pacemaker" to try to help with this.  Currently, I would have said that her performance status is probably ECOG 1.    Medications:  Allergies as of 09/08/2022       Reactions   Latex Rash   Causes blisters   Lidocaine Nausea And Vomiting   Peanut-containing Drug Products  Anaphylaxis   Statins Other (See Comments)   Unable to tolerate   Sulfa Antibiotics Shortness Of Breath, Rash   Metformin And Related Diarrhea   Reglan [metoclopramide] Tinitus   Adhesive [tape] Rash   Ciprofloxacin Rash   Elemental Sulfur Rash        Medication List        Accurate as of September 08, 2022 12:54 PM. If you have any questions, ask your nurse or doctor.          ascorbic acid 500 MG tablet Commonly known as: VITAMIN C Take 500 mg by mouth daily.   Basaglar KwikPen 100 UNIT/ML 22 Units daily.   BD Pen Needle Nano U/F 32G X 4 MM Misc Generic drug: Insulin Pen Needle Inject insulin once daily E11.65   benzonatate 200 MG capsule Commonly known as: TESSALON Take 1 capsule (200 mg total) by mouth 2 (two) times daily as needed for cough.   ezetimibe 10 MG tablet Commonly known as: ZETIA Take 1 tablet (10 mg total) by mouth daily.   gabapentin 300 MG capsule Commonly known as: NEURONTIN TAKE ONE CAPSULE BY MOUTH ONE TIME DAILY AT BEDTIME   letrozole 2.5 MG tablet Commonly known as: FEMARA Take 1 tablet (2.5 mg total) by mouth daily.   meloxicam 15  MG tablet Commonly known as: MOBIC Take 1 tablet (15 mg total) by mouth daily.   metoprolol tartrate 25 MG tablet Commonly known as: LOPRESSOR TAKE ONE TABLET BY MOUTH TWICE A DAY   NONFORMULARY OR COMPOUNDED ITEM Vitamin E vaginal suppositories 200u/ml.  One pv three times weekly.   ondansetron 8 MG disintegrating tablet Commonly known as: ZOFRAN-ODT DISSOLVE ONE TABLET BY MOUTH EVERY 8 HOURS AS NEEDED FOR NAUSEA AND VOMITING   onetouch ultrasoft lancets Check blood sugar daily as directed.   OneTouch Verio test strip Generic drug: glucose blood USE TO TEST ONE TIME DAILY AS DIRECTED   pantoprazole 40 MG tablet Commonly known as: PROTONIX Take 1 tablet by mouth daily.   pyridostigmine 60 MG tablet Commonly known as: MESTINON Take by mouth.   RABEprazole 20 MG tablet Commonly known as:  ACIPHEX Take 1 tablet by mouth in the morning and at bedtime.        Allergies:  Allergies  Allergen Reactions   Latex Rash    Causes blisters   Lidocaine Nausea And Vomiting   Peanut-Containing Drug Products Anaphylaxis   Statins Other (See Comments)    Unable to tolerate   Sulfa Antibiotics Shortness Of Breath and Rash   Metformin And Related Diarrhea   Reglan [Metoclopramide] Tinitus   Adhesive [Tape] Rash   Ciprofloxacin Rash   Elemental Sulfur Rash    Past Medical History, Surgical history, Social history, and Family History were reviewed and updated.  Review of Systems: Review of Systems  Constitutional: Negative.   HENT: Negative.    Eyes: Negative.   Respiratory: Negative.    Cardiovascular: Negative.   Gastrointestinal: Negative.   Genitourinary: Negative.   Musculoskeletal: Negative.   Skin: Negative.   Neurological: Negative.   Endo/Heme/Allergies: Negative.   Psychiatric/Behavioral: Negative.     [name]   Physical Exam:  vitals were not taken for this visit.   Wt Readings from Last 3 Encounters:  08/12/22 177 lb 3.2 oz (80.4 kg)  07/22/22 176 lb 3.2 oz (79.9 kg)  06/26/22 176 lb 12.8 oz (80.2 kg)    Physical Exam Vitals reviewed.  Constitutional:      Comments: Her breast exam shows right breast with no masses, edema or erythema. She has the biopsy site at about the 10 o'clock position on the right breast. There is no erythema or ecchymosis associated with this. There is no right axillary adenopathy.  Left breast shows the lumpectomy scar at the edge of the areola at 3:00.  There is no erythema or warmth.  There is no discharge noted.  There is no tenderness to palpation.  There is no left axillary adenopathy.  HENT:     Head: Normocephalic and atraumatic.  Eyes:     Pupils: Pupils are equal, round, and reactive to light.  Cardiovascular:     Rate and Rhythm: Normal rate and regular rhythm.     Heart sounds: Normal heart sounds.  Pulmonary:      Effort: Pulmonary effort is normal.     Breath sounds: Normal breath sounds.  Abdominal:     General: Bowel sounds are normal.     Palpations: Abdomen is soft.  Musculoskeletal:        General: No tenderness or deformity. Normal range of motion.     Cervical back: Normal range of motion.  Lymphadenopathy:     Cervical: No cervical adenopathy.  Skin:    General: Skin is warm and dry.     Findings: No  erythema or rash.  Neurological:     Mental Status: She is alert and oriented to person, place, and time.  Psychiatric:        Behavior: Behavior normal.        Thought Content: Thought content normal.        Judgment: Judgment normal.      Lab Results  Component Value Date   WBC 9.3 09/08/2022   HGB 13.8 09/08/2022   HCT 41.7 09/08/2022   MCV 90.8 09/08/2022   PLT 312 09/08/2022   Lab Results  Component Value Date   FERRITIN 28.9 01/24/2021   Lab Results  Component Value Date   RBC 4.59 09/08/2022   No results found for: "KPAFRELGTCHN", "LAMBDASER", "KAPLAMBRATIO" No results found for: "IGGSERUM", "IGA", "IGMSERUM" No results found for: "TOTALPROTELP", "ALBUMINELP", "A1GS", "A2GS", "BETS", "BETA2SER", "GAMS", "MSPIKE", "SPEI"   Chemistry      Component Value Date/Time   NA 141 09/08/2022 1158   NA 142 11/01/2019 0902   NA 144 03/11/2017 0822   K 4.2 09/08/2022 1158   K 4.5 03/11/2017 0822   CL 101 09/08/2022 1158   CL 104 03/11/2017 0822   CO2 32 09/08/2022 1158   CO2 29 03/11/2017 0822   BUN 17 09/08/2022 1158   BUN 14 11/01/2019 0902   BUN 9 03/11/2017 0822   CREATININE 1.00 09/08/2022 1158   CREATININE 0.82 04/05/2020 0732      Component Value Date/Time   CALCIUM 10.3 09/08/2022 1158   CALCIUM 9.2 03/11/2017 0822   ALKPHOS 88 09/08/2022 1158   ALKPHOS 65 03/11/2017 0822   AST 26 09/08/2022 1158   ALT 38 09/08/2022 1158   ALT 25 03/11/2017 0822   BILITOT 0.4 09/08/2022 1158       Impression and Plan: Carolyn Wilson is a 52 yo post-menopausal female  with a 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   She was diagnosed with stromal hyperplasia of the right breast and ductal hyperplasia of the left breast in December 2020.    She had a left breast lumpectomy with radioactive seed placement in February 2021.   She has had a similar clinical problem in the right breast.  She had a biopsy back in August 2021.    She is doing well on the Femara.  We will keep her on Femara.  I do not see any issues with Femara.  Again, surprised about this diagnosis of NF1.  Again, I will try to get information about this polyp.  I hate that she had COVID.  Had that she had RSV.  She certainly has recovered well.  We will still plan for follow-up in 6 months.    Volanda Napoleon, MD 2/19/202412:54 PM

## 2022-09-11 ENCOUNTER — Other Ambulatory Visit: Payer: Self-pay

## 2022-09-11 DIAGNOSIS — J45909 Unspecified asthma, uncomplicated: Secondary | ICD-10-CM | POA: Insufficient documentation

## 2022-09-11 DIAGNOSIS — T7840XA Allergy, unspecified, initial encounter: Secondary | ICD-10-CM | POA: Insufficient documentation

## 2022-09-11 DIAGNOSIS — Z9889 Other specified postprocedural states: Secondary | ICD-10-CM | POA: Insufficient documentation

## 2022-09-11 DIAGNOSIS — E785 Hyperlipidemia, unspecified: Secondary | ICD-10-CM | POA: Insufficient documentation

## 2022-09-11 DIAGNOSIS — C801 Malignant (primary) neoplasm, unspecified: Secondary | ICD-10-CM | POA: Insufficient documentation

## 2022-09-16 ENCOUNTER — Ambulatory Visit: Payer: 59 | Attending: Cardiology | Admitting: Cardiology

## 2022-09-16 ENCOUNTER — Encounter: Payer: Self-pay | Admitting: Cardiology

## 2022-09-16 VITALS — BP 106/68 | HR 70 | Ht 64.6 in | Wt 178.2 lb

## 2022-09-16 DIAGNOSIS — R079 Chest pain, unspecified: Secondary | ICD-10-CM | POA: Diagnosis not present

## 2022-09-16 DIAGNOSIS — E669 Obesity, unspecified: Secondary | ICD-10-CM

## 2022-09-16 DIAGNOSIS — E785 Hyperlipidemia, unspecified: Secondary | ICD-10-CM

## 2022-09-16 DIAGNOSIS — E119 Type 2 diabetes mellitus without complications: Secondary | ICD-10-CM | POA: Diagnosis not present

## 2022-09-16 NOTE — Patient Instructions (Signed)
Medication Instructions:  Your physician recommends that you continue on your current medications as directed. Please refer to the Current Medication list given to you today.  *If you need a refill on your cardiac medications before your next appointment, please call your pharmacy*   Lab Work: None ordered If you have labs (blood work) drawn today and your tests are completely normal, you will receive your results only by: [place] (if you have MyChart) OR A paper copy in the mail If you have any lab test that is abnormal or we need to change your treatment, we will call you to review the results.   Testing/Procedures: None ordered   Follow-Up: At [facility], you and your health needs are our priority.  As part of our continuing mission to provide you with exceptional heart care, we have created designated Provider Care Teams.  These Care Teams include your primary Cardiologist (physician) and Advanced Practice Providers (APPs -  Physician Assistants and Nurse Practitioners) who all work together to provide you with the care you need, when you need it.  We recommend signing up for the patient portal called "MyChart".  Sign up information is provided on this After Visit Summary.  MyChart is used to connect with patients for Virtual Visits (Telemedicine).  Patients are able to view lab/test results, encounter notes, upcoming appointments, etc.  Non-urgent messages can be sent to your provider as well.   To learn more about what you can do with MyChart, go to [email or web address].    Your next appointment:   As needed  The format for your next appointment:   In Person  Provider:   Jyl Heinz, MD    Other Instructions none  Important Information About Sugar

## 2022-09-16 NOTE — Progress Notes (Signed)
Cardiology Office Note:    Date:  09/16/2022   ID:  Carolyn Wilson, DOB June 24, 1971, MRN [id number]  PCP:  Marrian Salvage, FNP  Cardiologist:  Jenean Lindau, MD   Referring MD: Marrian Salvage,*    ASSESSMENT:    1. Chest pain of uncertain etiology   2. Obesity (BMI 30.0-34.9)   3. Diabetes mellitus without complication ([place])   4. Dyslipidemia    PLAN:    In order of problems listed above:  Primary prevention stressed with the patient.  Importance of compliance with diet medication stressed and she vocalized understanding. Chest pain: I think it would be best to look for an on cardiac etiologies.  Her calcium score is 0 her CT coronary angiography has been fine in the past and she has excellent effort tolerance and exercise on a regular basis.  I reassured her about my findings. Diabetes mellitus: Managed by primary care.  She is doing her best to get her numbers to goal.  Diet was emphasized. Obesity: Weight reduction was stressed and she promises to do better. Mixed dyslipidemia: On lipid-lowering medications: Followed by primary care again.  I am not sure why she is not on statin therapy.  Her goal LDL must be less than 60. I discussed the above with her at length.  She will be seen on a follow-up appointment as needed.   Medication Adjustments/Labs and Tests Ordered: Current medicines are reviewed at length with the patient today.  Concerns regarding medicines are outlined above.  No orders of the defined types were placed in this encounter.  No orders of the defined types were placed in this encounter.    No chief complaint on file.    History of Present Illness:    Carolyn Wilson is a 52 y.o. female.  Patient has past medical history of ovarian and breast cancer.  She was sent back here because of some chest pain issues.  Patient mentions to me that she is very active she walks about 5 miles on a regular basis.  She denies any chest  pain orthopnea or PND.  She is a diabetic and now on insulin.  At the time of my evaluation, the patient is alert awake oriented and in no distress.  Past Medical History:  Diagnosis Date   Abnormal Pap smear of cervix    h/o colposcopy/biopsy   Allergic rhinitis 08/13/2014   Allergy    Allergy with anaphylaxis due to peanuts 08/13/2014   Anal skin tag 04/12/2021   Asthma    HX   Benign neoplasm of colon 10/03/2009   Bilious vomiting with nausea 01/01/2022   Cancer ([place]) 1996-1997   ovarian   Chronic diarrhea 10/01/2021   Diabetes ([place]) 09/04/2015   Diabetes mellitus without complication ([place]) [id number]   diagnosed by PCP, [name]    Diabetic gastroparesis ([place]) 02/03/2022   Diarrhea post cholecystectomy since 1999 08/13/2014   Dyslipidemia 07/31/2020   Esophageal dysphagia 01/01/2022   Family history of breast cancer 01/06/2012   Family history of malignant neoplasm of ovary 01/06/2012   Fascial hernia 11/29/2020   FHx: colon cancer  father  08/13/2014   Gastroesophageal reflux disease without esophagitis 08/13/2014   GERD (gastroesophageal reflux disease)    Headache, migraine 08/02/2012   Overview:   IMO Load 2016 R1.3   Hyperlipemia    on medication    Increased risk of breast cancer 07/24/2022   Irritable bowel syndrome with diarrhea 01/01/2022   Nausea 12/21/2013  PONV (postoperative nausea and vomiting)    Rectal bleeding 09/20/2015   Rectal pain 09/20/2015   Right shoulder pain 12/18/2016   Right upper quadrant pain 11/03/2006   S/P endometrial ablation 08/13/2014   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   Overview:   In 1996, found while pregnant     In 1996, found while pregnant  Overview:   Overview:   In 1996, found while pregnant   Tachycardia 08/05/2017   Type 2 diabetes mellitus without complication, without long-term current use of insulin ([place]) 07/31/2020   Wellness examination 11/21/2015   Wrist sprain, left, subsequent encounter 08/14/2020     Past Surgical History:  Procedure Laterality Date   APPENDECTOMY     age [age]   BREAST EXCISIONAL BIOPSY Left 08/2019   BREAST LUMPECTOMY Left 2008   left breast   CHOLECYSTECTOMY  2000   ENDOMETRIAL ABLATION  2009   LAPAROSCOPIC SALPINGO OOPHERECTOMY Left 1996   with right tubal ligation   RADIOACTIVE SEED GUIDED EXCISIONAL BREAST BIOPSY Left 09/13/2019   Procedure: RADIOACTIVE SEED GUIDED EXCISIONAL LEFT BREAST BIOPSY;  Surgeon: [name], MD;  Location: [facility];  Service: General;  Laterality: Left;   TUBAL LIGATION      Current Medications: Current Meds  Medication Sig   ezetimibe (ZETIA) 10 MG tablet Take 1 tablet (10 mg total) by mouth daily.   gabapentin (NEURONTIN) 300 MG capsule TAKE ONE CAPSULE BY MOUTH ONE TIME DAILY AT BEDTIME   glipiZIDE (GLUCOTROL) 5 MG tablet Take 5 mg by mouth 2 (two) times daily before a meal.   glucose blood (ONETOUCH VERIO) test strip USE TO TEST ONE TIME DAILY AS DIRECTED   Insulin Glargine (BASAGLAR KWIKPEN) 100 UNIT/ML Inject 22 Units into the skin daily.   letrozole (FEMARA) 2.5 MG tablet Take 1 tablet (2.5 mg total) by mouth daily.   metoprolol tartrate (LOPRESSOR) 25 MG tablet TAKE ONE TABLET BY MOUTH TWICE A DAY   NONFORMULARY OR COMPOUNDED ITEM Vitamin E vaginal suppositories 200u/ml.  One pv three times weekly.   ondansetron (ZOFRAN-ODT) 8 MG disintegrating tablet DISSOLVE ONE TABLET BY MOUTH EVERY 8 HOURS AS NEEDED FOR NAUSEA AND VOMITING   pantoprazole (PROTONIX) 40 MG tablet Take 1 tablet by mouth daily.   pyridostigmine (MESTINON) 60 MG tablet Take 30 mg by mouth 3 (three) times daily.   vitamin C (ASCORBIC ACID) 500 MG tablet Take 500 mg by mouth daily.     Allergies:   Latex, Lidocaine, Peanut-containing drug products, Statins, Sulfa antibiotics, Sulfasalazine, Wound dressings, Metformin and related, Reglan [metoclopramide], Adhesive [tape], Ciprofloxacin, and Elemental sulfur   Social History    Socioeconomic History   Marital status: Married    Spouse name: Not on file   Number of children: Not on file   Years of education: Not on file   Highest education level: Not on file  Occupational History   Not on file  Tobacco Use   Smoking status: Never   Smokeless tobacco: Never   Tobacco comments:    NEVER USED TOBACCO  Vaping Use   Vaping Use: Never used  Substance and Sexual Activity   Alcohol use: Yes    Alcohol/week: 1.0 standard drink of alcohol    Types: 1 Standard drinks or equivalent per week   Drug use: No   Sexual activity: Yes    Partners: Male    Birth control/protection: Surgical  Other Topics Concern   Not on file  Social History Narrative   **  Merged History Encounter **       Social Determinants of Health   Financial Resource Strain: Not on file  Food Insecurity: Not on file  Transportation Needs: Not on file  Physical Activity: Not on file  Stress: Not on file  Social Connections: Not on file     Family History: The patient's family history includes Breast cancer in her maternal aunt and maternal grandmother; Cancer in an other family member; Colon cancer in her cousin and father; Diabetes in her mother; Melanoma in her cousin; Ovarian cancer in her cousin and maternal aunt.  ROS:   Please see the history of present illness.    All other systems reviewed and are negative.  EKGs/Labs/Other Studies Reviewed:    The following studies were reviewed today: I reviewed CT angiography report and her calcium score was 0 and coronary angiography was fine.   Recent Labs: 09/08/2022: ALT 38; BUN 17; Creatinine 1.00; Hemoglobin 13.8; Platelet Count 312; Potassium 4.2; Sodium 141  Recent Lipid Panel    Component Value Date/Time   CHOL 210 (H) 04/05/2020 0732   CHOL 185 03/11/2017 0822   TRIG 289 (H) 04/05/2020 0732   TRIG 145 03/11/2017 0822   HDL 38 (L) 04/05/2020 0732   HDL 48 03/11/2017 0822   CHOLHDL 5.5 (H) 04/05/2020 0732   VLDL 34.4  06/21/2019 0846   LDLCALC 128 (H) 04/05/2020 0732   LDLDIRECT 115.0 03/09/2018 0730    Physical Exam:    VS:  BP 106/68   Pulse 70   Ht 5' 4.6" (1.641 m)   Wt 178 lb 3.2 oz (80.8 kg)   LMP 04/27/2017 Comment: spotting  SpO2 94%   BMI 30.02 kg/m     Wt Readings from Last 3 Encounters:  09/16/22 178 lb 3.2 oz (80.8 kg)  09/08/22 178 lb 3.2 oz (80.8 kg)  08/12/22 177 lb 3.2 oz (80.4 kg)     GEN: Patient is in no acute distress HEENT: Normal NECK: No JVD; No carotid bruits LYMPHATICS: No lymphadenopathy CARDIAC: Hear sounds regular, 2/6 systolic murmur at the apex. RESPIRATORY:  Clear to auscultation without rales, wheezing or rhonchi  ABDOMEN: Soft, non-tender, non-distended MUSCULOSKELETAL:  No edema; No deformity  SKIN: Warm and dry NEUROLOGIC:  Alert and oriented x 3 PSYCHIATRIC:  Normal affect   Signed, Jenean Lindau, MD  09/16/2022 5:13 PM    [facility]

## 2022-09-27 ENCOUNTER — Other Ambulatory Visit (HOSPITAL_BASED_OUTPATIENT_CLINIC_OR_DEPARTMENT_OTHER): Payer: Self-pay | Admitting: Obstetrics & Gynecology

## 2022-09-27 DIAGNOSIS — R232 Flushing: Secondary | ICD-10-CM

## 2022-11-03 ENCOUNTER — Encounter: Payer: Self-pay | Admitting: *Deleted

## 2022-11-17 LAB — HM DIABETES EYE EXAM

## 2022-11-27 ENCOUNTER — Ambulatory Visit: Payer: 59 | Admitting: Family Medicine

## 2022-12-02 LAB — HEMOGLOBIN A1C: Hemoglobin A1C: 7.6

## 2022-12-10 ENCOUNTER — Other Ambulatory Visit: Payer: Self-pay

## 2022-12-10 ENCOUNTER — Ambulatory Visit: Payer: 59 | Admitting: Family Medicine

## 2022-12-10 ENCOUNTER — Encounter: Payer: Self-pay | Admitting: Family Medicine

## 2022-12-10 VITALS — BP 130/80 | Ht 64.6 in | Wt 178.0 lb

## 2022-12-10 DIAGNOSIS — M79604 Pain in right leg: Secondary | ICD-10-CM

## 2022-12-10 DIAGNOSIS — M84363A Stress fracture, right fibula, initial encounter for fracture: Secondary | ICD-10-CM | POA: Insufficient documentation

## 2022-12-10 NOTE — Assessment & Plan Note (Signed)
Acutely occurring.  She did have an injury about 4 weeks ago.  But appears more consistent with a fibular stress change. -Counseled on home exercise therapy and supportive care. -Aircast. -Could consider further imaging or physical therapy

## 2022-12-10 NOTE — Progress Notes (Signed)
Carolyn Wilson - 52 y.o. female MRN [id number]  Date of birth: 1971/05/10  SUBJECTIVE:  Including CC & ROS.  No chief complaint on file.   Carolyn Wilson is a 52 y.o. female that is presenting with acute right lateral lower leg pain.  The pain has been occurring for 4 weeks.  She did have an inversion injury but the pain is occurring in the lower leg.  No history of similar pain.  Pain is worse with pressure to the area as well as walking at times.    Review of Systems See HPI   HISTORY: Past Medical, Surgical, Social, and Family History Reviewed & Updated per EMR.   Pertinent Historical Findings include:  Past Medical History:  Diagnosis Date   Abnormal Pap smear of cervix    h/o colposcopy/biopsy   Allergic rhinitis 08/13/2014   Allergy    Allergy with anaphylaxis due to peanuts 08/13/2014   Anal skin tag 04/12/2021   Asthma    HX   Benign neoplasm of colon 10/03/2009   Bilious vomiting with nausea 01/01/2022   Cancer ([facility]) 1996-1997   ovarian   Chronic diarrhea 10/01/2021   Diabetes ([facility]) 09/04/2015   Diabetes mellitus without complication ([facility]) 07/2015   diagnosed by PCP, Dr. Lutricia Horsfall    Diabetic gastroparesis ([facility]) 02/03/2022   Diarrhea post cholecystectomy since 1999 08/13/2014   Dyslipidemia 07/31/2020   Esophageal dysphagia 01/01/2022   Family history of breast cancer 01/06/2012   Family history of malignant neoplasm of ovary 01/06/2012   Fascial hernia 11/29/2020   FHx: colon cancer  father  08/13/2014   Gastroesophageal reflux disease without esophagitis 08/13/2014   GERD (gastroesophageal reflux disease)    Headache, migraine 08/02/2012   Overview:   IMO Load 2016 R1.3   Hyperlipemia    on medication    Increased risk of breast cancer 07/24/2022   Irritable bowel syndrome with diarrhea 01/01/2022   Nausea 12/21/2013   PONV (postoperative nausea and vomiting)    Rectal bleeding 09/20/2015   Rectal pain 09/20/2015   Right shoulder pain  12/18/2016   Right upper quadrant pain 11/03/2006   S/P endometrial ablation 08/13/2014   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   Overview:   In 1996, found while pregnant     In 1996, found while pregnant  Overview:   Overview:   In 1996, found while pregnant   Tachycardia 08/05/2017   Type 2 diabetes mellitus without complication, without long-term current use of insulin ([facility]) 07/31/2020   Wellness examination 11/21/2015   Wrist sprain, left, subsequent encounter 08/14/2020    Past Surgical History:  Procedure Laterality Date   APPENDECTOMY     age [age]   BREAST EXCISIONAL BIOPSY Left 08/2019   BREAST LUMPECTOMY Left 2008   left breast   CHOLECYSTECTOMY  2000   ENDOMETRIAL ABLATION  2009   LAPAROSCOPIC SALPINGO OOPHERECTOMY Left 1996   with right tubal ligation   RADIOACTIVE SEED GUIDED EXCISIONAL BREAST BIOPSY Left 09/13/2019   Procedure: RADIOACTIVE SEED GUIDED EXCISIONAL LEFT BREAST BIOPSY;  Surgeon: Emelia Loron, MD;  Location: [facility];  Service: General;  Laterality: Left;   TUBAL LIGATION       PHYSICAL EXAM:  VS: BP 130/80 (BP Location: Left Arm, Patient Position: Sitting)   Ht 5' 4.6" (1.641 m)   Wt 178 lb (80.7 kg)   LMP 04/27/2017 Comment: spotting  BMI 29.99 kg/m  Physical Exam Gen: NAD, alert, cooperative with exam, well-appearing MSK:  Neurovascularly  intact    Limited ultrasound: Right leg pain:  No cortical defect of the fibular shaft. Increased hyperemia over the distal third of the fibula. No changes of the peroneal tendons  Summary: Findings most consistent with a stress change of the fibula  Ultrasound and interpretation by Clare Gandy, MD    ASSESSMENT & PLAN:   Stress fracture of right fibula Acutely occurring.  She did have an injury about 4 weeks ago.  But appears more consistent with a fibular stress change. -Counseled on home exercise therapy and supportive care. -Aircast. -Could consider further  imaging or physical therapy

## 2022-12-10 NOTE — Patient Instructions (Signed)
Good to see you Please use ice as needed  Please use the aircast over the next 2-3 weeks  Please try the exercise   You can try vitamin D3 and Vitamin K2 Please send me a message in MyChart with any questions or updates.  Please see the clinic back in 4 weeks or as needed if better.   --Dr. Jordan Likes

## 2022-12-23 ENCOUNTER — Other Ambulatory Visit: Payer: Self-pay | Admitting: Obstetrics & Gynecology

## 2022-12-23 DIAGNOSIS — Z1231 Encounter for screening mammogram for malignant neoplasm of breast: Secondary | ICD-10-CM

## 2022-12-26 ENCOUNTER — Other Ambulatory Visit: Payer: Self-pay | Admitting: Family

## 2022-12-26 DIAGNOSIS — E1169 Type 2 diabetes mellitus with other specified complication: Secondary | ICD-10-CM

## 2023-01-15 ENCOUNTER — Other Ambulatory Visit: Payer: Self-pay | Admitting: Family

## 2023-01-23 ENCOUNTER — Ambulatory Visit (INDEPENDENT_AMBULATORY_CARE_PROVIDER_SITE_OTHER): Payer: 59 | Admitting: Family

## 2023-01-23 ENCOUNTER — Encounter: Payer: Self-pay | Admitting: Family

## 2023-01-23 VITALS — BP 110/62 | HR 72 | Temp 97.8°F | Resp 18 | Ht 64.5 in | Wt 178.2 lb

## 2023-01-23 DIAGNOSIS — Z0000 Encounter for general adult medical examination without abnormal findings: Secondary | ICD-10-CM

## 2023-01-23 NOTE — Progress Notes (Signed)
Carolyn Wilson is a 52 y.o. female with the following history as recorded in EpicCare:  Patient Active Problem List   Diagnosis Date Noted   Stress fracture of right fibula 12/10/2022   Chest pain of uncertain etiology 09/16/2022   Obesity (BMI 30.0-34.9) 09/16/2022   Allergy 09/11/2022   Asthma 09/11/2022   Cancer ([facility]) 09/11/2022   Hyperlipemia 09/11/2022   PONV (postoperative nausea and vomiting) 09/11/2022   Increased risk of breast cancer 07/24/2022   Diabetic gastroparesis ([facility]) 02/03/2022   Bilious vomiting with nausea 01/01/2022   Irritable bowel syndrome with diarrhea 01/01/2022   Esophageal dysphagia 01/01/2022   Chronic diarrhea 10/01/2021   Anal skin tag 04/12/2021   Fascial hernia 11/29/2020   Wrist sprain, left, subsequent encounter 08/14/2020   Dyslipidemia 07/31/2020   Type 2 diabetes mellitus without complication, without long-term current use of insulin ([facility]) 07/31/2020   Tachycardia 08/05/2017   Right shoulder pain 12/18/2016   Wellness examination 11/21/2015   Rectal bleeding 09/20/2015   Rectal pain 09/20/2015   Diabetes ([facility]) 09/04/2015   Diabetes mellitus without complication ([facility]) 07/2015   Allergy with anaphylaxis due to peanuts 08/13/2014   Abnormal Pap smear of cervix  remote [place]  1996 per pt 08/13/2014   Allergic rhinitis 08/13/2014   S/P endometrial ablation 08/13/2014   Gastroesophageal reflux disease without esophagitis 08/13/2014   Diarrhea post cholecystectomy since 1999 08/13/2014   FHx: colon cancer  father  08/13/2014   Nausea 12/21/2013   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   Headache, migraine 08/02/2012   Family history of breast cancer 01/06/2012   Family history of malignant neoplasm of ovary 01/06/2012   Benign neoplasm of colon 10/03/2009   Right upper quadrant pain 11/03/2006    Current Outpatient Medications  Medication Sig Dispense Refill   ezetimibe (ZETIA) 10 MG tablet TAKE ONE TABLET BY MOUTH ONE TIME  DAILY 90 tablet 3   fluticasone (FLONASE) 50 MCG/ACT nasal spray USE TWO SPRAYS IN EACH NOSTRIL ONE TIME DAILY 16 mL 11   gabapentin (NEURONTIN) 300 MG capsule TAKE ONE CAPSULE BY MOUTH ONE TIME DAILY AT BEDTIME 90 capsule 3   glipiZIDE (GLUCOTROL) 5 MG tablet Take 5 mg by mouth 2 (two) times daily before a meal.     glucose blood (ONETOUCH VERIO) test strip USE TO TEST ONE TIME DAILY AS DIRECTED 100 strip 11   Insulin Glargine (BASAGLAR KWIKPEN) 100 UNIT/ML Inject 22 Units into the skin daily.     Lancets (ONETOUCH ULTRASOFT) lancets Check blood sugar daily as directed. 100 each 12   metoprolol tartrate (LOPRESSOR) 25 MG tablet TAKE ONE TABLET BY MOUTH TWICE A DAY 180 tablet 3   NONFORMULARY OR COMPOUNDED ITEM Vitamin E vaginal suppositories 200u/ml.  One pv three times weekly. 36 each 3   ondansetron (ZOFRAN-ODT) 8 MG disintegrating tablet DISSOLVE ONE TABLET BY MOUTH EVERY 8 HOURS AS NEEDED FOR NAUSEA AND VOMITING 20 tablet 0   pantoprazole (PROTONIX) 40 MG tablet Take 1 tablet by mouth daily.     vitamin C (ASCORBIC ACID) 500 MG tablet Take 500 mg by mouth daily.     No current facility-administered medications for this visit.    Allergies: Latex, Lidocaine, Peanut-containing drug products, Statins, Sulfa antibiotics, Sulfasalazine, Wound dressings, Metformin and related, Reglan [metoclopramide], Adhesive [tape], Ciprofloxacin, and Elemental sulfur  Past Medical History:  Diagnosis Date   Abnormal Pap smear of cervix    h/o colposcopy/biopsy   Allergic rhinitis 08/13/2014   Allergy  Allergy with anaphylaxis due to peanuts 08/13/2014   Anal skin tag 04/12/2021   Asthma    HX   Benign neoplasm of colon 10/03/2009   Bilious vomiting with nausea 01/01/2022   Cancer ([facility]) 1996-1997   ovarian   Chronic diarrhea 10/01/2021   Diabetes ([facility]CC) 09/04/2015   Diabetes mellitus without complication ([facility]) 07/2015   diagnosed by PCP, Dr. Lutricia Horsfall    Diabetic gastroparesis ([facility]CC) 02/03/2022    Diarrhea post cholecystectomy since 1999 08/13/2014   Dyslipidemia 07/31/2020   Esophageal dysphagia 01/01/2022   Family history of breast cancer 01/06/2012   Family history of malignant neoplasm of ovary 01/06/2012   Fascial hernia 11/29/2020   FHx: colon cancer  father  08/13/2014   Gastroesophageal reflux disease without esophagitis 08/13/2014   GERD (gastroesophageal reflux disease)    Headache, migraine 08/02/2012   Overview:   IMO Load 2016 R1.3   Hyperlipemia    on medication    Increased risk of breast cancer 07/24/2022   Irritable bowel syndrome with diarrhea 01/01/2022   Nausea 12/21/2013   PONV (postoperative nausea and vomiting)    Rectal bleeding 09/20/2015   Rectal pain 09/20/2015   Right shoulder pain 12/18/2016   Right upper quadrant pain 11/03/2006   S/P endometrial ablation 08/13/2014   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   Overview:   In 1996, found while pregnant     In 1996, found while pregnant  Overview:   Overview:   In 1996, found while pregnant   Tachycardia 08/05/2017   Type 2 diabetes mellitus without complication, without long-term current use of insulin (HCC) 07/31/2020   Wellness examination 11/21/2015   Wrist sprain, left, subsequent encounter 08/14/2020    Past Surgical History:  Procedure Laterality Date   APPENDECTOMY     age [age]   BREAST EXCISIONAL BIOPSY Left 08/2019   BREAST LUMPECTOMY Left 2008   left breast   CHOLECYSTECTOMY  2000   ENDOMETRIAL ABLATION  2009   LAPAROSCOPIC SALPINGO OOPHERECTOMY Left 1996   with right tubal ligation   RADIOACTIVE SEED GUIDED EXCISIONAL BREAST BIOPSY Left 09/13/2019   Procedure: RADIOACTIVE SEED GUIDED EXCISIONAL LEFT BREAST BIOPSY;  Surgeon: Emelia Loron, MD;  Location: [facility];  Service: General;  Laterality: Left;   TUBAL LIGATION      Family History  Problem Relation Age of Onset   Diabetes Mother    Colon cancer Father    Breast cancer Maternal Aunt     Ovarian cancer Maternal Aunt    Ovarian cancer Cousin    Colon cancer Cousin    Melanoma Cousin    Breast cancer Maternal Grandmother    Cancer Other        breast and ovarian-maternal side    Social History   Tobacco Use   Smoking status: Never   Smokeless tobacco: Never   Tobacco comments:    NEVER USED TOBACCO  Substance Use Topics   Alcohol use: Yes    Alcohol/week: 1.0 standard drink of alcohol    Types: 1 Standard drinks or equivalent per week    Subjective:   Presents for yearly CPE; continuing to work with numerous specialists including GYN, endocrinology and GI; no acute concerns today; okay with not doing labs today as they are done by other providers on routine basis;  Review of Systems  Constitutional: Negative.   HENT: Negative.    Eyes: Negative.   Respiratory: Negative.    Cardiovascular: Negative.   Gastrointestinal: Negative.  Genitourinary: Negative.   Musculoskeletal:  Positive for joint pain.  Skin: Negative.   Neurological: Negative.   Endo/Heme/Allergies: Negative.   Psychiatric/Behavioral: Negative.       Objective:  Vitals:   01/23/23 0828  BP: 110/62  Pulse: 72  Resp: 18  Temp: 97.8 F (36.6 C)  TempSrc: Temporal  SpO2: 97%  Weight: 178 lb 3.2 oz (80.8 kg)  Height: 5' 4.5" (1.638 m)    General: Well developed, well nourished, in no acute distress  Skin : Warm and dry.  Head: Normocephalic and atraumatic  Eyes: Sclera and conjunctiva clear; pupils round and reactive to light; extraocular movements intact  Ears: External normal; canals clear; tympanic membranes normal  Oropharynx: Pink, supple. No suspicious lesions  Neck: Supple without thyromegaly, adenopathy  Lungs: Respirations unlabored; clear to auscultation bilaterally without wheeze, rales, rhonchi  CVS exam: normal rate and regular rhythm.  Musculoskeletal: No deformities; no active joint inflammation  Extremities: No edema, cyanosis, clubbing  Vessels: Symmetric  bilaterally  Neurologic: Alert and oriented; speech intact; face symmetrical; moves all extremities well; CNII-XII intact without focal deficit   Assessment:  1. PE (physical exam), annual     Plan:  Age appropriate preventive healthcare needs addressed; encouraged regular eye doctor and dental exams; encouraged regular exercise; will update labs and refills as needed today; follow-up in 1 year,  sooner prn.    No follow-ups on file.  Orders Placed This Encounter  Procedures   Hemoglobin A1c    This external order was created through the Results Console.    Requested Prescriptions    No prescriptions requested or ordered in this encounter

## 2023-01-23 NOTE — Patient Instructions (Signed)
You are taking very good care of your preventive health. Please see your GYN and get your mammogram as scheduled. Please let us know if you need [place]- otherwise we can check in next year.

## 2023-02-09 ENCOUNTER — Ambulatory Visit: Payer: 59 | Admitting: Family Medicine

## 2023-02-10 ENCOUNTER — Ambulatory Visit
Admission: RE | Admit: 2023-02-10 | Discharge: 2023-02-10 | Disposition: A | Discharge status: Home / Self Care | Payer: 59 | Source: Ambulatory Visit | Attending: Obstetrics & Gynecology | Admitting: Obstetrics & Gynecology

## 2023-02-10 DIAGNOSIS — Z1231 Encounter for screening mammogram for malignant neoplasm of breast: Secondary | ICD-10-CM

## 2023-03-04 ENCOUNTER — Ambulatory Visit: Payer: 59 | Admitting: Sports Medicine

## 2023-03-09 ENCOUNTER — Inpatient Hospital Stay: Payer: 59

## 2023-03-09 ENCOUNTER — Ambulatory Visit: Payer: 59 | Admitting: Hematology & Oncology

## 2023-03-17 ENCOUNTER — Inpatient Hospital Stay: Payer: 59

## 2023-03-17 ENCOUNTER — Inpatient Hospital Stay: Payer: 59 | Admitting: Hematology & Oncology

## 2023-03-30 ENCOUNTER — Inpatient Hospital Stay: Payer: 59 | Admitting: Hematology & Oncology

## 2023-03-30 ENCOUNTER — Inpatient Hospital Stay: Payer: 59 | Attending: Hematology & Oncology

## 2023-03-30 ENCOUNTER — Other Ambulatory Visit: Payer: Self-pay

## 2023-03-30 ENCOUNTER — Encounter: Payer: Self-pay | Admitting: Hematology & Oncology

## 2023-03-30 VITALS — BP 122/56 | HR 80 | Temp 97.6°F | Resp 18 | Ht 64.0 in | Wt 178.9 lb

## 2023-03-30 DIAGNOSIS — N6489 Other specified disorders of breast: Secondary | ICD-10-CM | POA: Diagnosis not present

## 2023-03-30 DIAGNOSIS — Z8041 Family history of malignant neoplasm of ovary: Secondary | ICD-10-CM | POA: Insufficient documentation

## 2023-03-30 DIAGNOSIS — D270 Benign neoplasm of right ovary: Secondary | ICD-10-CM | POA: Diagnosis not present

## 2023-03-30 DIAGNOSIS — Z79811 Long term (current) use of aromatase inhibitors: Secondary | ICD-10-CM | POA: Insufficient documentation

## 2023-03-30 DIAGNOSIS — Z803 Family history of malignant neoplasm of breast: Secondary | ICD-10-CM | POA: Insufficient documentation

## 2023-03-30 DIAGNOSIS — N6092 Unspecified benign mammary dysplasia of left breast: Secondary | ICD-10-CM | POA: Insufficient documentation

## 2023-03-30 LAB — CBC WITH DIFFERENTIAL (CANCER CENTER ONLY)
Abs Immature Granulocytes: 0.09 10*3/uL — ABNORMAL HIGH (ref 0.00–0.07)
Basophils Absolute: 0 10*3/uL (ref 0.0–0.1)
Basophils Relative: 1 %
Eosinophils Absolute: 0.3 10*3/uL (ref 0.0–0.5)
Eosinophils Relative: 4 %
HCT: 40.7 % (ref 36.0–46.0)
Hemoglobin: 13.7 g/dL (ref 12.0–15.0)
Immature Granulocytes: 1 %
Lymphocytes Relative: 34 %
Lymphs Abs: 2.8 10*3/uL (ref 0.7–4.0)
MCH: 30.9 pg (ref 26.0–34.0)
MCHC: 33.7 g/dL (ref 30.0–36.0)
MCV: 91.7 fL (ref 80.0–100.0)
Monocytes Absolute: 0.5 10*3/uL (ref 0.1–1.0)
Monocytes Relative: 6 %
Neutro Abs: 4.5 10*3/uL (ref 1.7–7.7)
Neutrophils Relative %: 54 %
Platelet Count: 284 10*3/uL (ref 150–400)
RBC: 4.44 MIL/uL (ref 3.87–5.11)
RDW: 12.5 % (ref 11.5–15.5)
WBC Count: 8.2 10*3/uL (ref 4.0–10.5)
nRBC: 0 % (ref 0.0–0.2)

## 2023-03-30 LAB — CMP (CANCER CENTER ONLY)
ALT: 26 U/L (ref 0–44)
AST: 21 U/L (ref 15–41)
Albumin: 4.4 g/dL (ref 3.5–5.0)
Alkaline Phosphatase: 79 U/L (ref 38–126)
Anion gap: 9 (ref 5–15)
BUN: 12 mg/dL (ref 6–20)
CO2: 30 mmol/L (ref 22–32)
Calcium: 9.6 mg/dL (ref 8.9–10.3)
Chloride: 103 mmol/L (ref 98–111)
Creatinine: 1.02 mg/dL — ABNORMAL HIGH (ref 0.44–1.00)
GFR, Estimated: >60 mL/min (ref >60)
Glucose, Bld: 176 mg/dL — ABNORMAL HIGH (ref 70–99)
Potassium: 4.1 mmol/L (ref 3.5–5.1)
Sodium: 142 mmol/L (ref 135–145)
Total Bilirubin: 0.5 mg/dL (ref 0.3–1.2)
Total Protein: 7.1 g/dL (ref 6.5–8.1)

## 2023-03-30 LAB — LACTATE DEHYDROGENASE: LDH: 160 U/L (ref 98–192)

## 2023-03-30 NOTE — Progress Notes (Signed)
Hematology and Oncology Follow Up Visit  Carolyn Wilson [id number] May 14, 1971 52 y.o. 03/30/2023   Principle Diagnosis:  1. History of Sertoli-Leydig tumor of the left ovary  2. Strong family history of breast and ovarian cancer - BRCA 1/2 and p53 negative 3. 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 Complex Sclerosing Lesion with usual ductal hyperplasia of the LEFT breast (central)   Current Therapy:         Femara 2.5 po  q day - start on 10/31/2020   Interim History:  Carolyn Wilson is here today for follow-up.  Unfortunately, her mother passed away in Dec 21, 2022.  I know this has been tough on her.  Her son did get engaged.  He will get married in November 2025.  She is still working for [employer].  She is quite busy..  She did have a nice summer.  She was down in [place]..  The big news is that she is going need to have surgery because of her gastroparesis.  She may need to have a gastric bypass.  She is going down to [facility] in [place] for an evaluation in early October..  Otherwise, she is managing pretty well.  She is on the Femara.  She is doing well on the Femara.  She is not having problems with hot flashes..  She is having no obvious change in bowel or bladder habits.  Again the gastroparesis is a problem for her.  She is having a lot of nausea and vomiting with this.  She has had no rashes.  There is been no bleeding.  Overall, I would say performance status is probably ECOG 0.     Medications:  Allergies as of 03/30/2023       Reactions   Latex Rash   Causes blisters   Lidocaine Nausea And Vomiting   Peanut-containing Drug Products Anaphylaxis   Statins Other (See Comments)   Unable to tolerate   Sulfa Antibiotics Shortness Of Breath, Rash   Sulfasalazine Shortness Of Breath, Rash   Wound Dressings Itching, Rash   Other reaction(s):  Blisters   Metformin And Related Diarrhea   Reglan [metoclopramide]  Tinitus   Adhesive [tape] Rash   Ciprofloxacin Rash   Elemental Sulfur Rash        Medication List        Accurate as of March 30, 2023  8:19 AM. If you have any questions, ask your nurse or doctor.          ascorbic acid 500 MG tablet Commonly known as: VITAMIN C Take 500 mg by mouth daily.   Basaglar KwikPen 100 UNIT/ML Inject 22 Units into the skin daily.   ezetimibe 10 MG tablet Commonly known as: ZETIA TAKE ONE TABLET BY MOUTH ONE TIME DAILY   fluticasone 50 MCG/ACT nasal spray Commonly known as: FLONASE USE TWO SPRAYS IN EACH NOSTRIL ONE TIME DAILY   gabapentin 300 MG capsule Commonly known as: NEURONTIN TAKE ONE CAPSULE BY MOUTH ONE TIME DAILY AT BEDTIME   glipiZIDE 5 MG tablet Commonly known as: GLUCOTROL Take 5 mg by mouth 2 (two) times daily before a meal.   metoprolol tartrate 25 MG tablet Commonly known as: LOPRESSOR TAKE ONE TABLET BY MOUTH TWICE A DAY   NONFORMULARY OR COMPOUNDED ITEM Vitamin E vaginal suppositories 200u/ml.  One pv three times weekly.   ondansetron 8 MG disintegrating tablet Commonly known as: ZOFRAN-ODT DISSOLVE ONE TABLET BY MOUTH EVERY 8 HOURS AS NEEDED FOR  NAUSEA AND VOMITING   onetouch ultrasoft lancets Check blood sugar daily as directed.   OneTouch Verio test strip Generic drug: glucose blood USE TO TEST ONE TIME DAILY AS DIRECTED   pantoprazole 40 MG tablet Commonly known as: PROTONIX Take 1 tablet by mouth daily.        Allergies:  Allergies  Allergen Reactions   Latex Rash    Causes blisters   Lidocaine Nausea And Vomiting   Peanut-Containing Drug Products Anaphylaxis   Statins Other (See Comments)    Unable to tolerate   Sulfa Antibiotics Shortness Of Breath and Rash   Sulfasalazine Shortness Of Breath and Rash   Wound Dressings Itching and Rash    Other reaction(s):  Blisters   Metformin And Related Diarrhea   Reglan [Metoclopramide] Tinitus   Adhesive [Tape] Rash   Ciprofloxacin Rash    Elemental Sulfur Rash    Past Medical History, Surgical history, Social history, and Family History were reviewed and updated.  Review of Systems: Review of Systems  Constitutional: Negative.   HENT: Negative.    Eyes: Negative.   Respiratory: Negative.    Cardiovascular: Negative.   Gastrointestinal: Negative.   Genitourinary: Negative.   Musculoskeletal: Negative.   Skin: Negative.   Neurological: Negative.   Endo/Heme/Allergies: Negative.   Psychiatric/Behavioral: Negative.     [name]   Physical Exam:  height is 5\' 4"  (1.626 m) and weight is 178 lb 14.4 oz (81.1 kg). Her oral temperature is 97.6 F (36.4 C). Her blood pressure is 122/56 (abnormal) and her pulse is 80. Her respiration is 18 and oxygen saturation is 100%.   Wt Readings from Last 3 Encounters:  03/30/23 178 lb 14.4 oz (81.1 kg)  01/23/23 178 lb 3.2 oz (80.8 kg)  12/10/22 178 lb (80.7 kg)    Physical Exam Vitals reviewed.  Constitutional:      Comments: Her breast exam shows right breast with no masses, edema or erythema. She has the biopsy site at about the 10 o'clock position on the right breast. There is no erythema or ecchymosis associated with this. There is no right axillary adenopathy.  Left breast shows the lumpectomy scar at the edge of the areola at 3:00.  There is no erythema or warmth.  There is no discharge noted.  There is no tenderness to palpation.  There is no left axillary adenopathy.  HENT:     Head: Normocephalic and atraumatic.  Eyes:     Pupils: Pupils are equal, round, and reactive to light.  Cardiovascular:     Rate and Rhythm: Normal rate and regular rhythm.     Heart sounds: Normal heart sounds.  Pulmonary:     Effort: Pulmonary effort is normal.     Breath sounds: Normal breath sounds.  Abdominal:     General: Bowel sounds are normal.     Palpations: Abdomen is soft.  Musculoskeletal:        General: No tenderness or deformity. Normal range of motion.     Cervical back: Normal  range of motion.  Lymphadenopathy:     Cervical: No cervical adenopathy.  Skin:    General: Skin is warm and dry.     Findings: No erythema or rash.  Neurological:     Mental Status: She is alert and oriented to person, place, and time.  Psychiatric:        Behavior: Behavior normal.        Thought Content: Thought content normal.  Judgment: Judgment normal.      Lab Results  Component Value Date   WBC 8.2 03/30/2023   HGB 13.7 03/30/2023   HCT 40.7 03/30/2023   MCV 91.7 03/30/2023   PLT 284 03/30/2023   Lab Results  Component Value Date   FERRITIN 28.9 01/24/2021   Lab Results  Component Value Date   RBC 4.44 03/30/2023   No results found for: "KPAFRELGTCHN", "LAMBDASER", "KAPLAMBRATIO" No results found for: "IGGSERUM", "IGA", "IGMSERUM" No results found for: "TOTALPROTELP", "ALBUMINELP", "A1GS", "A2GS", "BETS", "BETA2SER", "GAMS", "MSPIKE", "SPEI"   Chemistry      Component Value Date/Time   NA 142 03/30/2023 0741   NA 142 11/01/2019 0902   NA 144 03/11/2017 0822   K 4.1 03/30/2023 0741   K 4.5 03/11/2017 0822   CL 103 03/30/2023 0741   CL 104 03/11/2017 0822   CO2 30 03/30/2023 0741   CO2 29 03/11/2017 0822   BUN 12 03/30/2023 0741   BUN 14 11/01/2019 0902   BUN 9 03/11/2017 0822   CREATININE 1.02 (H) 03/30/2023 0741   CREATININE 0.82 04/05/2020 0732      Component Value Date/Time   CALCIUM 9.6 03/30/2023 0741   CALCIUM 9.2 03/11/2017 0822   ALKPHOS 79 03/30/2023 0741   ALKPHOS 65 03/11/2017 0822   AST 21 03/30/2023 0741   ALT 26 03/30/2023 0741   ALT 25 03/11/2017 0822   BILITOT 0.5 03/30/2023 0741       Impression and Plan: Ms. 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   She was diagnosed with stromal hyperplasia of the right breast and ductal hyperplasia of the left breast in December 2020.    She had a left breast lumpectomy with  radioactive seed placement in February 2021.   She has had a similar clinical problem in the right breast.  She had a biopsy back in August 2021.    She is doing well on the Femara.  We will keep her on Femara.  I do not see any issues with Femara.  Hopefully, everything will go well with the gastric issue.  Hopefully, she can have back to the gastric pacemaker placed and not a bypass.  As always, we will plan to get her back to see [place] in another 6 months.     Josph Macho, MD 9/9/20248:19 AM

## 2023-05-25 ENCOUNTER — Ambulatory Visit: Payer: 59 | Admitting: Physician Assistant

## 2023-05-25 VITALS — BP 122/81 | HR 89 | Temp 98.4°F | Ht 64.0 in | Wt 179.5 lb

## 2023-05-25 DIAGNOSIS — R509 Fever, unspecified: Secondary | ICD-10-CM

## 2023-05-25 NOTE — Progress Notes (Signed)
Established patient visit   Patient: Carolyn Wilson   DOB: 1970/11/18   52 y.o. Female  MRN: [id number] Visit Date: 05/25/2023  Today's healthcare provider: Alfredia Ferguson, PA-C   Chief Complaint  Patient presents with   Fever    Patient states she feels fine- just running a fever. Suppose to have surgery the 18th   Subjective     Pt reports intermittent fevers checked at home. Ranging from 99 -101 F. The last few nights maybe 22F. She denies recent illness, surgery, injury. Denies URI symptoms, urinary symptoms. Denies night sweats or lymphadenopathy.  Reports she feels slightly fatigued/rundown which is why she is checking for a fever.   Medications: Outpatient Medications Prior to Visit  Medication Sig   ezetimibe (ZETIA) 10 MG tablet TAKE ONE TABLET BY MOUTH ONE TIME DAILY   fluticasone (FLONASE) 50 MCG/ACT nasal spray USE TWO SPRAYS IN EACH NOSTRIL ONE TIME DAILY   gabapentin (NEURONTIN) 300 MG capsule TAKE ONE CAPSULE BY MOUTH ONE TIME DAILY AT BEDTIME   glipiZIDE (GLUCOTROL) 5 MG tablet Take 5 mg by mouth 2 (two) times daily before a meal.   glucose blood (ONETOUCH VERIO) test strip USE TO TEST ONE TIME DAILY AS DIRECTED   Insulin Glargine (BASAGLAR KWIKPEN) 100 UNIT/ML Inject 22 Units into the skin daily.   Lancets (ONETOUCH ULTRASOFT) lancets Check blood sugar daily as directed.   metoprolol tartrate (LOPRESSOR) 25 MG tablet TAKE ONE TABLET BY MOUTH TWICE A DAY   NONFORMULARY OR COMPOUNDED ITEM Vitamin E vaginal suppositories 200u/ml.  One pv three times weekly.   ondansetron (ZOFRAN-ODT) 8 MG disintegrating tablet DISSOLVE ONE TABLET BY MOUTH EVERY 8 HOURS AS NEEDED FOR NAUSEA AND VOMITING   pantoprazole (PROTONIX) 40 MG tablet Take 1 tablet by mouth daily.   vitamin C (ASCORBIC ACID) 500 MG tablet Take 500 mg by mouth daily.   No facility-administered medications prior to visit.    Review of Systems  Constitutional:  Positive for fever. Negative for  fatigue.  Respiratory:  Negative for cough and shortness of breath.   Cardiovascular:  Negative for chest pain and leg swelling.  Gastrointestinal:  Negative for abdominal pain.  Neurological:  Negative for dizziness and headaches.       Objective    BP 122/81   Pulse 89   Temp 98.4 F (36.9 C) (Oral)   Ht 5\' 4"  (1.626 m)   Wt 179 lb 8 oz (81.4 kg)   LMP 04/27/2017 Comment: spotting  SpO2 97%   BMI 30.81 kg/m    Physical Exam Constitutional:      General: She is awake.     Appearance: She is well-developed.  HENT:     Head: Normocephalic.  Eyes:     Conjunctiva/sclera: Conjunctivae normal.  Cardiovascular:     Rate and Rhythm: Normal rate and regular rhythm.     Heart sounds: Normal heart sounds.  Pulmonary:     Effort: Pulmonary effort is normal.     Breath sounds: Normal breath sounds.  Chest:     Comments: No axillary adenopathy present b/l Lymphadenopathy:     Cervical: No cervical adenopathy.  Skin:    General: Skin is warm.  Neurological:     Mental Status: She is alert and oriented to person, place, and time.  Psychiatric:        Attention and Perception: Attention normal.        Mood and Affect: Mood normal.  Speech: Speech normal.        Behavior: Behavior is cooperative.     No results found for any visits on 05/25/23.  Assessment & Plan    Fever, unspecified  Pt reported. Normal in office, asymptomatic.Exam normal Pt has pre-op labs in the next few days-- I do not see the need to check CBC now when she will have it checked in a few days. Advised pt to continue to monitor symptoms, and only check temp if significant body aches or other fever symptoms.   Return if symptoms worsen or fail to improve.       Alfredia Ferguson, PA-C  [facility] [phone] (phone) [phone] (fax)  [facility]

## 2023-06-08 HISTORY — PX: GASTRECTOMY: SHX58

## 2023-06-25 ENCOUNTER — Encounter: Payer: Self-pay | Admitting: Family

## 2023-06-25 ENCOUNTER — Other Ambulatory Visit: Payer: Self-pay | Admitting: Family

## 2023-06-25 MED ORDER — FLUCONAZOLE 150 MG PO TABS: ORAL_TABLET | ORAL | 0 refills | Status: DC

## 2023-07-17 ENCOUNTER — Ambulatory Visit: Payer: 59 | Admitting: Family

## 2023-07-17 ENCOUNTER — Encounter: Payer: Self-pay | Admitting: Family

## 2023-07-17 VITALS — BP 120/80 | HR 96 | Ht 64.0 in | Wt 154.9 lb

## 2023-07-17 DIAGNOSIS — R42 Dizziness and giddiness: Secondary | ICD-10-CM

## 2023-07-17 NOTE — Progress Notes (Signed)
Carolyn Wilson is a 52 y.o. female with the following history as recorded in EpicCare:  Patient Active Problem List   Diagnosis Date Noted   Stress fracture of right fibula 12/10/2022   Chest pain of uncertain etiology 09/16/2022   Obesity (BMI 30.0-34.9) 09/16/2022   Allergy 09/11/2022   Asthma 09/11/2022   Cancer (HCC) 09/11/2022   Hyperlipemia 09/11/2022   PONV (postoperative nausea and vomiting) 09/11/2022   Increased risk of breast cancer 07/24/2022   Diabetic gastroparesis (HCC) 02/03/2022   Bilious vomiting with nausea 01/01/2022   Irritable bowel syndrome with diarrhea 01/01/2022   Esophageal dysphagia 01/01/2022   Chronic diarrhea 10/01/2021   Anal skin tag 04/12/2021   Fascial hernia 11/29/2020   Wrist sprain, left, subsequent encounter 08/14/2020   Dyslipidemia 07/31/2020   Type 2 diabetes mellitus without complication, without long-term current use of insulin (HCC) 07/31/2020   Tachycardia 08/05/2017   Right shoulder pain 12/18/2016   Wellness examination 11/21/2015   Rectal bleeding 09/20/2015   Rectal pain 09/20/2015   Diabetes (HCC) 09/04/2015   Diabetes mellitus without complication (HCC) 07/2015   Allergy with anaphylaxis due to peanuts 08/13/2014   Abnormal Pap smear of cervix  remote Leep  1996 per pt 08/13/2014   Allergic rhinitis 08/13/2014   S/P endometrial ablation 08/13/2014   Gastroesophageal reflux disease without esophagitis 08/13/2014   Diarrhea post cholecystectomy since 1999 08/13/2014   FHx: colon cancer  father  08/13/2014   Nausea 12/21/2013   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     No current facility-administered medications for this visit.    Allergies: Latex, Lidocaine, Peanut-containing drug products, Statins, Sulfa antibiotics, Sulfasalazine, Wound dressings, Metformin and related, Reglan [metoclopramide], Adhesive [tape], Ciprofloxacin, and Elemental sulfur  Past Medical History:  Diagnosis Date   Abnormal Pap smear of cervix    h/o colposcopy/biopsy   Allergic rhinitis 08/13/2014   Allergy    Allergy with anaphylaxis due to peanuts 08/13/2014   Anal skin tag 04/12/2021   Asthma    HX   Benign neoplasm of colon 10/03/2009   Bilious vomiting with nausea 01/01/2022   Breast cancer (HCC)    Cancer (HCC) 1996-1997   ovarian   Chronic diarrhea 10/01/2021   Diabetes (HCC) 09/04/2015   Diabetes mellitus without complication (HCC) 07/2015   diagnosed by PCP, Dr. Lutricia Horsfall    Diabetic gastroparesis (HCC) 02/03/2022   Diarrhea post cholecystectomy since 1999 08/13/2014   Dyslipidemia 07/31/2020   Esophageal dysphagia 01/01/2022   Family history of breast cancer 01/06/2012   Family history of malignant neoplasm of ovary 01/06/2012   Fascial hernia 11/29/2020   FHx: colon cancer  father  08/13/2014   Gastroesophageal reflux disease without esophagitis 08/13/2014   GERD (gastroesophageal reflux disease)    Headache, migraine 08/02/2012   Overview:   IMO Load 2016 R1.3   Hyperlipemia    on medication  Increased risk of breast cancer 07/24/2022   Irritable bowel syndrome with diarrhea 01/01/2022   Nausea 12/21/2013   PONV (postoperative nausea and vomiting)    Rectal bleeding 09/20/2015   Rectal pain 09/20/2015   Right shoulder pain 12/18/2016   Right upper quadrant pain 11/03/2006   S/P endometrial ablation  08/13/2014   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   Overview:   In 1996, found while pregnant     In 1996, found while pregnant  Overview:   Overview:   In 1996, found while pregnant   Tachycardia 08/05/2017   Type 2 diabetes mellitus without complication, without long-term current use of insulin (HCC) 07/31/2020   Wellness examination 11/21/2015   Wrist sprain, left, subsequent encounter 08/14/2020    Past Surgical History:  Procedure Laterality Date   APPENDECTOMY     age [age]   BREAST EXCISIONAL BIOPSY Left 08/2019   BREAST LUMPECTOMY Left 2008   left breast   CHOLECYSTECTOMY  2000   ENDOMETRIAL ABLATION  2009   LAPAROSCOPIC SALPINGO OOPHERECTOMY Left 1996   with right tubal ligation   RADIOACTIVE SEED GUIDED EXCISIONAL BREAST BIOPSY Left 09/13/2019   Procedure: RADIOACTIVE SEED GUIDED EXCISIONAL LEFT BREAST BIOPSY;  Surgeon: Emelia Loron, MD;  Location: [facility];  Service: General;  Laterality: Left;   TUBAL LIGATION      Family History  Problem Relation Age of Onset   Diabetes Mother    Colon cancer Father    Breast cancer Maternal Aunt    Ovarian cancer Maternal Aunt    Ovarian cancer Cousin    Colon cancer Cousin    Melanoma Cousin    Breast cancer Maternal Grandmother    Cancer Other        breast and ovarian-maternal side    Social History   Tobacco Use   Smoking status: Never   Smokeless tobacco: Never   Tobacco comments:    NEVER USED TOBACCO  Substance Use Topics   Alcohol use: Yes    Alcohol/week: 1.0 standard drink of alcohol    Types: 1 Standard drinks or equivalent per week    Subjective:   Patient had surgery for complications of gastroparesis with Laproscopic subtotal gastrectomy with RNY reconstruction- has lost 25 pounds since having the surgery; has been told that she will most likely lose another 30 pounds in the next 2-3 months; has been encouraged to discuss options for stopping Metoprolol; notes that has been  feeling light-headed/ dizzy with changing positions on almost daily basis recently; no chest pain or shortness of breath;   Objective:  Vitals:   07/17/23 1537  BP: 120/80  Pulse: 96  SpO2: 96%  Weight: 154 lb 14.4 oz (70.3 kg)  Height: 5\' 4"  (1.626 m)    General: Well developed, well nourished, in no acute distress  Skin : Warm and dry.  Head: Normocephalic and atraumatic  Lungs: Respirations unlabored; clear to auscultation bilaterally without wheeze, rales, rhonchi  CVS exam: normal rate and regular rhythm.  Neurologic: Alert and oriented; speech intact; face symmetrical; moves all extremities well; CNII-XII intact without focal deficit   Assessment:  1. Orthostatic dizziness     Plan:  Patient has been experiencing symptoms with recent weight loss and will most likely be losing another 25 pounds; agree that can stop Metoprolol- she will taper off as discussed and follow up with her response in the next 2 weeks; if she does have recurrent tachycardia, can consider Coreg 3.125 mg; follow up  to be determined.   No follow-ups on file.  No orders of the defined types were placed in this encounter.   Requested Prescriptions    No prescriptions requested or ordered in this encounter

## 2023-07-17 NOTE — Patient Instructions (Signed)
Let's go ahead and stop the Metoprolol-  Take 1 per day x 5 days ( starting today) and then every other day x 3 days and then stop; then let us know if you aren't feeling good;

## 2023-07-27 ENCOUNTER — Encounter (HOSPITAL_BASED_OUTPATIENT_CLINIC_OR_DEPARTMENT_OTHER): Payer: Self-pay | Admitting: Obstetrics & Gynecology

## 2023-07-27 ENCOUNTER — Other Ambulatory Visit (HOSPITAL_COMMUNITY)
Admission: RE | Admit: 2023-07-27 | Discharge: 2023-07-27 | Disposition: A | Discharge status: Home / Self Care | Payer: 59 | Source: Ambulatory Visit | Attending: Obstetrics & Gynecology | Admitting: Obstetrics & Gynecology

## 2023-07-27 ENCOUNTER — Ambulatory Visit (HOSPITAL_BASED_OUTPATIENT_CLINIC_OR_DEPARTMENT_OTHER): Payer: 59 | Admitting: Obstetrics & Gynecology

## 2023-07-27 VITALS — BP 121/89 | HR 121 | Ht 63.5 in | Wt 151.8 lb

## 2023-07-27 DIAGNOSIS — Z9189 Other specified personal risk factors, not elsewhere classified: Secondary | ICD-10-CM | POA: Diagnosis not present

## 2023-07-27 DIAGNOSIS — Z803 Family history of malignant neoplasm of breast: Secondary | ICD-10-CM

## 2023-07-27 DIAGNOSIS — Z01419 Encounter for gynecological examination (general) (routine) without abnormal findings: Secondary | ICD-10-CM

## 2023-07-27 DIAGNOSIS — Z780 Asymptomatic menopausal state: Secondary | ICD-10-CM

## 2023-07-27 DIAGNOSIS — Z124 Encounter for screening for malignant neoplasm of cervix: Secondary | ICD-10-CM

## 2023-07-27 DIAGNOSIS — D271 Benign neoplasm of left ovary: Secondary | ICD-10-CM | POA: Diagnosis not present

## 2023-07-27 DIAGNOSIS — Z800 Family history of malignant neoplasm of digestive organs: Secondary | ICD-10-CM

## 2023-07-27 DIAGNOSIS — Z9889 Other specified postprocedural states: Secondary | ICD-10-CM

## 2023-07-27 NOTE — Progress Notes (Signed)
 53 y.o. G78P0100 Married White or Caucasian female here for annual exam.  Has had a lot this last year.  Ongoing hx of emesis that she's had for years.  Ended up undergoing a roux en y with subtotal gastrectomy 05/2023.  This was done due to gastroparesis and by Dr. Winfred at [facility].  She has lost about 30 pounds but that wasn't really the purpose of the procedure.  She is expecting some additional weight loss.  Her weight is being watched very carefully for concerns it may get too low from the surgery.    Denies vaginal bleeding.  Hot flashes seem to have resolved.  She's off everything except Protonix.    Her mother passed mother's day weekend in 2024.    Patient's last menstrual period was 04/27/2017.          Sexually active: No.   Health Maintenance: Pap:  05/25/2020 Negative History of abnormal Pap:  remote hx MMG:  02/10/2023 Negative, MRI 06/2022 Colonoscopy:  10/17/2021.  Had hamartoma present. Did see neurology for this.   Has GI follow up scheduled BMD:   05/31/2020 Normal Screening Labs: done several lab tests this past year.  Reviewed in EPIC and CareEverywhere   reports that she has never smoked. She has never used smokeless tobacco. She reports current alcohol use of about 1.0 standard drink of alcohol per week. She reports that she does not use drugs.  Past Medical History:  Diagnosis Date   Abnormal Pap smear of cervix    h/o colposcopy/biopsy   Allergic rhinitis 08/13/2014   Allergy    Allergy with anaphylaxis due to peanuts 08/13/2014   Anal skin tag 04/12/2021   Asthma    HX   Benign neoplasm of colon 10/03/2009   Bilious vomiting with nausea 01/01/2022   Breast cancer (HCC)    Cancer (HCC) 1996-1997   ovarian   Chronic diarrhea 10/01/2021   Diabetes (HCC) 09/04/2015   Diabetes mellitus without complication (HCC) 07/2015   diagnosed by PCP, Dr. Raiford    Diabetic gastroparesis (HCC) 02/03/2022   Diarrhea post cholecystectomy since 1999 08/13/2014    Dyslipidemia 07/31/2020   Esophageal dysphagia 01/01/2022   Family history of breast cancer 01/06/2012   Family history of malignant neoplasm of ovary 01/06/2012   Fascial hernia 11/29/2020   FHx: colon cancer  father  08/13/2014   Gastroesophageal reflux disease without esophagitis 08/13/2014   GERD (gastroesophageal reflux disease)    Headache, migraine 08/02/2012   Overview:   IMO Load 2016 R1.3   Hyperlipemia    on medication    Increased risk of breast cancer 07/24/2022   Irritable bowel syndrome with diarrhea 01/01/2022   Nausea 12/21/2013   PONV (postoperative nausea and vomiting)    Rectal bleeding 09/20/2015   Rectal pain 09/20/2015   Right shoulder pain 12/18/2016   Right upper quadrant pain 11/03/2006   S/P endometrial ablation 08/13/2014   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   Overview:   In 1996, found while pregnant     In 1996, found while pregnant  Overview:   Overview:   In 1996, found while pregnant   Tachycardia 08/05/2017   Type 2 diabetes mellitus without complication, without long-term current use of insulin (HCC) 07/31/2020   Wellness examination 11/21/2015   Wrist sprain, left, subsequent encounter 08/14/2020    Past Surgical History:  Procedure Laterality Date   APPENDECTOMY     age [age]   BREAST EXCISIONAL BIOPSY Left 08/2019   BREAST  LUMPECTOMY Left 2008   left breast   CHOLECYSTECTOMY  2000   ENDOMETRIAL ABLATION  2009   GASTRECTOMY  06/08/2023   LAPAROSCOPIC SALPINGO OOPHERECTOMY Left 1996   with right tubal ligation   RADIOACTIVE SEED GUIDED EXCISIONAL BREAST BIOPSY Left 09/13/2019   Procedure: RADIOACTIVE SEED GUIDED EXCISIONAL LEFT BREAST BIOPSY;  Surgeon: Ebbie Cough, MD;  Location: [facility];  Service: General;  Laterality: Left;   TUBAL LIGATION      Current Outpatient Medications  Medication Sig Dispense Refill   ondansetron  (ZOFRAN -ODT) 8 MG disintegrating tablet DISSOLVE ONE TABLET BY MOUTH EVERY 8  HOURS AS NEEDED FOR NAUSEA AND VOMITING 20 tablet 0   pantoprazole (PROTONIX) 40 MG tablet Take 1 tablet by mouth daily.     No current facility-administered medications for this visit.    Family History  Problem Relation Age of Onset   Diabetes Mother    Colon cancer Father    Breast cancer Maternal Aunt    Ovarian cancer Maternal Aunt    Ovarian cancer Cousin    Colon cancer Cousin    Melanoma Cousin    Breast cancer Maternal Grandmother    Cancer Other        breast and ovarian-maternal side    ROS: Constitutional: negative Genitourinary:negative  Exam:   BP 121/89 (BP Location: Left Arm, Patient Position: Sitting, Cuff Size: Large)   Pulse (!) 121   Ht 5' 3.5 (1.613 m)   Wt 151 lb 12.8 oz (68.9 kg)   LMP 04/27/2017 Comment: spotting  BMI 26.47 kg/m   Height: 5' 3.5 (161.3 cm)  General appearance: alert, cooperative and appears stated age Head: Normocephalic, without obvious abnormality, atraumatic Neck: no adenopathy, supple, symmetrical, trachea midline and thyroid  normal to inspection and palpation Lungs: clear to auscultation bilaterally Breasts: normal appearance, no masses or tenderness Heart: regular rate and rhythm Abdomen: soft, non-tender; bowel sounds normal; no masses,  no organomegaly Extremities: extremities normal, atraumatic, no cyanosis or edema Skin: Skin color, texture, turgor normal. No rashes or lesions Lymph nodes: Cervical, supraclavicular, and axillary nodes normal. No abnormal inguinal nodes palpated Neurologic: Grossly normal   Pelvic: External genitalia:  no lesions              Urethra:  normal appearing urethra with no masses, tenderness or lesions              Bartholins and Skenes: normal                 Vagina: normal appearing vagina with normal color and no discharge, no lesions              Cervix: no lesions              Pap taken: Yes.   Bimanual Exam:  Uterus:  normal size, contour, position, consistency, mobility,  non-tender              Adnexa: normal adnexa and no mass, fullness, tenderness               Rectovaginal: Confirms               Anus:  normal sphincter tone, no lesions  Chaperone, Bascom Kotyk, CMA, was present for exam.  Assessment/Plan: 1. Well woman exam with routine gynecological exam (Primary) - Pap smear and HR HPV obtained today - Mammogram 01/2023 - Colonoscopy 2023.  Follow up 5 years. - Bone mineral density 2021 - lab work done this year -  vaccines reviewed/updated  2. Increased risk of breast cancer - MR BREAST BILATERAL W WO CONTRAST INC CAD; Future  3. Family history of breast cancer  4. Cervical cancer screening - Cytology - PAP( [place])  5. 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  Ca-125 was normal post op.  No routine follow up recommended.    6. Postmenopausal  7. History of endometrial ablation  8. Family history of colon cancer - has follow up with GI this year

## 2023-07-28 LAB — CYTOLOGY - PAP
Comment: NEGATIVE
Diagnosis: NEGATIVE
High risk HPV: NEGATIVE

## 2023-07-31 ENCOUNTER — Encounter: Payer: Self-pay | Admitting: Family

## 2023-08-03 ENCOUNTER — Other Ambulatory Visit: Payer: Self-pay | Admitting: Family

## 2023-08-03 MED ORDER — CARVEDILOL 3.125 MG PO TABS: 3.1250 mg | ORAL_TABLET | Freq: Two times a day (BID) | ORAL | 0 refills | Status: DC

## 2023-08-19 ENCOUNTER — Other Ambulatory Visit: Payer: Self-pay | Admitting: Medical Genetics

## 2023-08-20 ENCOUNTER — Encounter: Payer: Self-pay | Admitting: Family

## 2023-08-29 ENCOUNTER — Other Ambulatory Visit: Payer: Self-pay | Admitting: Family

## 2023-08-30 ENCOUNTER — Ambulatory Visit
Admission: RE | Admit: 2023-08-30 | Discharge: 2023-08-30 | Disposition: A | Discharge status: Home / Self Care | Payer: 59 | Source: Ambulatory Visit | Attending: Obstetrics & Gynecology | Admitting: Obstetrics & Gynecology

## 2023-08-30 DIAGNOSIS — Z9189 Other specified personal risk factors, not elsewhere classified: Secondary | ICD-10-CM

## 2023-08-30 DIAGNOSIS — Z803 Family history of malignant neoplasm of breast: Secondary | ICD-10-CM

## 2023-08-30 MED ORDER — GADOPICLENOL 0.5 MMOL/ML IV SOLN
7.0000 mL | Freq: Once | INTRAVENOUS | Status: AC | PRN
  Administered 2023-08-30: 7 mL via INTRAVENOUS

## 2023-08-31 ENCOUNTER — Encounter (HOSPITAL_BASED_OUTPATIENT_CLINIC_OR_DEPARTMENT_OTHER): Payer: Self-pay | Admitting: Obstetrics & Gynecology

## 2023-09-08 ENCOUNTER — Other Ambulatory Visit (HOSPITAL_COMMUNITY): Payer: Self-pay | Attending: Medical Genetics

## 2023-09-14 ENCOUNTER — Other Ambulatory Visit (HOSPITAL_COMMUNITY): Payer: Self-pay

## 2023-09-28 ENCOUNTER — Other Ambulatory Visit: Payer: Self-pay

## 2023-09-28 ENCOUNTER — Inpatient Hospital Stay: Payer: 59 | Attending: Hematology & Oncology

## 2023-09-28 ENCOUNTER — Inpatient Hospital Stay (HOSPITAL_BASED_OUTPATIENT_CLINIC_OR_DEPARTMENT_OTHER): Payer: 59 | Admitting: Hematology & Oncology

## 2023-09-28 VITALS — BP 91/70 | HR 86 | Temp 97.8°F | Resp 16 | Ht 64.0 in | Wt 133.0 lb

## 2023-09-28 DIAGNOSIS — D241 Benign neoplasm of right breast: Secondary | ICD-10-CM | POA: Diagnosis present

## 2023-09-28 DIAGNOSIS — D271 Benign neoplasm of left ovary: Secondary | ICD-10-CM

## 2023-09-28 DIAGNOSIS — Z903 Acquired absence of stomach [part of]: Secondary | ICD-10-CM | POA: Diagnosis not present

## 2023-09-28 DIAGNOSIS — Z9884 Bariatric surgery status: Secondary | ICD-10-CM | POA: Insufficient documentation

## 2023-09-28 DIAGNOSIS — K219 Gastro-esophageal reflux disease without esophagitis: Secondary | ICD-10-CM | POA: Diagnosis not present

## 2023-09-28 DIAGNOSIS — D270 Benign neoplasm of right ovary: Secondary | ICD-10-CM

## 2023-09-28 DIAGNOSIS — N6092 Unspecified benign mammary dysplasia of left breast: Secondary | ICD-10-CM | POA: Diagnosis present

## 2023-09-28 LAB — CBC WITH DIFFERENTIAL (CANCER CENTER ONLY)
Abs Immature Granulocytes: 0.01 10*3/uL (ref 0.00–0.07)
Basophils Absolute: 0 10*3/uL (ref 0.0–0.1)
Basophils Relative: 0 %
Eosinophils Absolute: 0.2 10*3/uL (ref 0.0–0.5)
Eosinophils Relative: 2 %
HCT: 42.2 % (ref 36.0–46.0)
Hemoglobin: 14.3 g/dL (ref 12.0–15.0)
Immature Granulocytes: 0 %
Lymphocytes Relative: 31 %
Lymphs Abs: 2.2 10*3/uL (ref 0.7–4.0)
MCH: 30.6 pg (ref 26.0–34.0)
MCHC: 33.9 g/dL (ref 30.0–36.0)
MCV: 90.2 fL (ref 80.0–100.0)
Monocytes Absolute: 0.5 10*3/uL (ref 0.1–1.0)
Monocytes Relative: 7 %
Neutro Abs: 4.2 10*3/uL (ref 1.7–7.7)
Neutrophils Relative %: 60 %
Platelet Count: 255 10*3/uL (ref 150–400)
RBC: 4.68 MIL/uL (ref 3.87–5.11)
RDW: 13.1 % (ref 11.5–15.5)
WBC Count: 7.2 10*3/uL (ref 4.0–10.5)
nRBC: 0 % (ref 0.0–0.2)

## 2023-09-28 LAB — CMP (CANCER CENTER ONLY)
ALT: 29 U/L (ref 0–44)
AST: 27 U/L (ref 15–41)
Albumin: 4.2 g/dL (ref 3.5–5.0)
Alkaline Phosphatase: 65 U/L (ref 38–126)
Anion gap: 10 (ref 5–15)
BUN: 16 mg/dL (ref 6–20)
CO2: 31 mmol/L (ref 22–32)
Calcium: 9.4 mg/dL (ref 8.9–10.3)
Chloride: 103 mmol/L (ref 98–111)
Creatinine: 0.8 mg/dL (ref 0.44–1.00)
GFR, Estimated: >60 mL/min (ref >60)
Glucose, Bld: 123 mg/dL — ABNORMAL HIGH (ref 70–99)
Potassium: 4.2 mmol/L (ref 3.5–5.1)
Sodium: 144 mmol/L (ref 135–145)
Total Bilirubin: 0.5 mg/dL (ref 0.0–1.2)
Total Protein: 6.8 g/dL (ref 6.5–8.1)

## 2023-09-28 LAB — LACTATE DEHYDROGENASE: LDH: 141 U/L (ref 98–192)

## 2023-09-28 NOTE — Progress Notes (Signed)
 Hematology and Oncology Follow Up Visit  Warren Kugelman [id number] [birth year] 53 y.o. 09/28/2023   Principle Diagnosis:  1. History of Sertoli-Leydig tumor of the left ovary  2. Strong family history of breast and ovarian cancer - BRCA 1/2 and p53 negative 3. 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 Complex Sclerosing Lesion with usual ductal hyperplasia of the LEFT breast (central)   Current Therapy:         Femara 2.5 po  q day - start on 10/31/2020 - d/c on 07/2023   Interim History:  Ms. Esters is here today for follow-up.  She has lost about 50 pounds.  She underwent her gastric bypass.  She also had a gastrectomy.  This is for the gastroparesis that she was suffering from.  This is got rid of her diabetes.  She looks quite good.  I must say that  she is incredibly tough.  We will have to watch out for iron deficiency and vitamin B12 deficiency in the future.  She is still working.  She is quite busy at work.  Her son gets married in November of this year.  She stopped the Femara.  This was back in January.  We can probably go and stop this.  I am not sure how well she will absorb this anyway..  She has had no changes with the breast.  She has had no problems with bowels or bladder.  She has had no bleeding.  She has had no leg swelling.  She has had no rashes.  Overall, her performance status is probably ECOG 1.   Medications:  Allergies as of 09/28/2023       Reactions   Latex Rash   Causes blisters   Lidocaine Nausea And Vomiting   Peanut-containing Drug Products Anaphylaxis   Statins Other (See Comments)   Unable to tolerate   Sulfa Antibiotics Shortness Of Breath, Rash   Sulfasalazine Shortness Of Breath, Rash   Wound Dressings Itching, Rash   Other reaction(s):  Blisters   Metformin And Related Diarrhea   Reglan [metoclopramide] Tinitus   Adhesive [tape] Rash   Ciprofloxacin Rash   Elemental Sulfur  Rash        Medication List        Accurate as of September 28, 2023  8:03 AM. If you have any questions, ask your nurse or doctor.          carvedilol 3.125 MG tablet Commonly known as: COREG TAKE ONE TABLET BY MOUTH TWICE A DAY WITH A MEAL   multivitamin capsule Take 1 capsule by mouth daily.   ondansetron 8 MG disintegrating tablet Commonly known as: ZOFRAN-ODT DISSOLVE ONE TABLET BY MOUTH EVERY 8 HOURS AS NEEDED FOR NAUSEA AND VOMITING   pantoprazole 40 MG tablet Commonly known as: PROTONIX Take 1 tablet by mouth daily.        Allergies:  Allergies  Allergen Reactions   Latex Rash    Causes blisters   Lidocaine Nausea And Vomiting   Peanut-Containing Drug Products Anaphylaxis   Statins Other (See Comments)    Unable to tolerate   Sulfa Antibiotics Shortness Of Breath and Rash   Sulfasalazine Shortness Of Breath and Rash   Wound Dressings Itching and Rash    Other reaction(s):  Blisters   Metformin And Related Diarrhea   Reglan [Metoclopramide] Tinitus   Adhesive [Tape] Rash   Ciprofloxacin Rash   Elemental Sulfur Rash    Past Medical History, Surgical history,  Social history, and Family History were reviewed and updated.  Review of Systems: Review of Systems  Constitutional: Negative.   HENT: Negative.    Eyes: Negative.   Respiratory: Negative.    Cardiovascular: Negative.   Gastrointestinal: Negative.   Genitourinary: Negative.   Musculoskeletal: Negative.   Skin: Negative.   Neurological: Negative.   Endo/Heme/Allergies: Negative.   Psychiatric/Behavioral: Negative.     [name]   Physical Exam:  97.8.  Pulse 86.  Blood pressure 91/70.  Weight is 133 pounds.  Wt Readings from Last 3 Encounters:  07/27/23 151 lb 12.8 oz (68.9 kg)  07/17/23 154 lb 14.4 oz (70.3 kg)  05/25/23 179 lb 8 oz (81.4 kg)    Physical Exam Vitals reviewed.  Constitutional:      Comments: Her breast exam shows right breast with no masses, edema or erythema. She has  the biopsy site at about the 10 o'clock position on the right breast. There is no erythema or ecchymosis associated with this. There is no right axillary adenopathy.  Left breast shows the lumpectomy scar at the edge of the areola at 3:00.  There is no erythema or warmth.  There is no discharge noted.  There is no tenderness to palpation.  There is no left axillary adenopathy.  HENT:     Head: Normocephalic and atraumatic.  Eyes:     Pupils: Pupils are equal, round, and reactive to light.  Cardiovascular:     Rate and Rhythm: Normal rate and regular rhythm.     Heart sounds: Normal heart sounds.  Pulmonary:     Effort: Pulmonary effort is normal.     Breath sounds: Normal breath sounds.  Abdominal:     General: Bowel sounds are normal.     Palpations: Abdomen is soft.  Musculoskeletal:        General: No tenderness or deformity. Normal range of motion.     Cervical back: Normal range of motion.  Lymphadenopathy:     Cervical: No cervical adenopathy.  Skin:    General: Skin is warm and dry.     Findings: No erythema or rash.  Neurological:     Mental Status: She is alert and oriented to person, place, and time.  Psychiatric:        Behavior: Behavior normal.        Thought Content: Thought content normal.        Judgment: Judgment normal.      Lab Results  Component Value Date   WBC 7.2 09/28/2023   HGB 14.3 09/28/2023   HCT 42.2 09/28/2023   MCV 90.2 09/28/2023   PLT 255 09/28/2023   Lab Results  Component Value Date   FERRITIN 28.9 01/24/2021   Lab Results  Component Value Date   RBC 4.68 09/28/2023   No results found for: "KPAFRELGTCHN", "LAMBDASER", "KAPLAMBRATIO" No results found for: "IGGSERUM", "IGA", "IGMSERUM" No results found for: "TOTALPROTELP", "ALBUMINELP", "A1GS", "A2GS", "BETS", "BETA2SER", "GAMS", "MSPIKE", "SPEI"   Chemistry      Component Value Date/Time   NA 142 03/30/2023 0741   NA 142 11/01/2019 0902   NA 144 03/11/2017 0822   K 4.1  03/30/2023 0741   K 4.5 03/11/2017 0822   CL 103 03/30/2023 0741   CL 104 03/11/2017 0822   CO2 30 03/30/2023 0741   CO2 29 03/11/2017 0822   BUN 12 03/30/2023 0741   BUN 14 11/01/2019 0902   BUN 9 03/11/2017 0822   CREATININE 1.02 (H) 03/30/2023 0741  CREATININE 0.82 04/05/2020 0732      Component Value Date/Time   CALCIUM 9.6 03/30/2023 0741   CALCIUM 9.2 03/11/2017 0822   ALKPHOS 79 03/30/2023 0741   ALKPHOS 65 03/11/2017 0822   AST 21 03/30/2023 0741   ALT 26 03/30/2023 0741   ALT 25 03/11/2017 0822   BILITOT 0.5 03/30/2023 0741       Impression and Plan: Ms. 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   She was diagnosed with stromal hyperplasia of the right breast and ductal hyperplasia of the left breast in December 2020.    She had a left breast lumpectomy with radioactive seed placement in February 2021.   She has had a similar clinical problem in the right breast.  She had a biopsy back in August 2021.    I am amazed as how well she looks after having the surgical procedure for her stomach.  I feel bad that she had a gastroparesis.  This really was affecting her life.  Again we will have to watch for her iron and B12 levels now.  I will plan to see her back in 6 months.    Josph Macho, MD 3/10/20258:03 AM

## 2023-10-30 ENCOUNTER — Ambulatory Visit: Admitting: Family

## 2023-10-30 ENCOUNTER — Encounter: Payer: Self-pay | Admitting: Family

## 2023-10-30 VITALS — BP 108/60 | HR 87 | Ht 64.0 in | Wt 123.5 lb

## 2023-10-30 DIAGNOSIS — Z01818 Encounter for other preprocedural examination: Secondary | ICD-10-CM | POA: Diagnosis not present

## 2023-10-30 DIAGNOSIS — R000 Tachycardia, unspecified: Secondary | ICD-10-CM

## 2023-10-30 MED ORDER — CARVEDILOL 3.125 MG PO TABS: 3.1250 mg | ORAL_TABLET | Freq: Two times a day (BID) | ORAL | 3 refills | Status: DC

## 2023-10-30 NOTE — Patient Instructions (Signed)
 [email or web address]

## 2023-10-30 NOTE — Progress Notes (Signed)
 Carolyn Wilson is a 53 y.o. female with the following history as recorded in EpicCare:  Patient Active Problem List   Diagnosis Date Noted   Stress fracture of right fibula 12/10/2022   Chest pain of uncertain etiology 09/16/2022   Obesity (BMI 30.0-34.9) 09/16/2022   Allergy 09/11/2022   Asthma 09/11/2022   Cancer (HCC) 09/11/2022   Hyperlipemia 09/11/2022   PONV (postoperative nausea and vomiting) 09/11/2022   Increased risk of breast cancer 07/24/2022   Diabetic gastroparesis (HCC) 02/03/2022   Bilious vomiting with nausea 01/01/2022   Irritable bowel syndrome with diarrhea 01/01/2022   Esophageal dysphagia 01/01/2022   Chronic diarrhea 10/01/2021   Anal skin tag 04/12/2021   Fascial hernia 11/29/2020   Wrist sprain, left, subsequent encounter 08/14/2020   Dyslipidemia 07/31/2020   Type 2 diabetes mellitus without complication, without long-term current use of insulin (HCC) 07/31/2020   Tachycardia 08/05/2017   Right shoulder pain 12/18/2016   Wellness examination 11/21/2015   Rectal bleeding 09/20/2015   Rectal pain 09/20/2015   Diabetes (HCC) 09/04/2015   Diabetes mellitus without complication (HCC) 07/2015   Allergy with anaphylaxis due to peanuts 08/13/2014   Abnormal Pap smear of cervix  remote Leep  1996 per pt 08/13/2014   Allergic rhinitis 08/13/2014   S/P endometrial ablation 08/13/2014   Gastroesophageal reflux disease without esophagitis 08/13/2014   Diarrhea post cholecystectomy since 1999 08/13/2014   FHx: colon cancer  father  08/13/2014   Nausea 12/21/2013   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     ondansetron (ZOFRAN-ODT) 8 MG  disintegrating tablet DISSOLVE ONE TABLET BY MOUTH EVERY 8 HOURS AS NEEDED FOR NAUSEA AND VOMITING 20 tablet 0   pantoprazole (PROTONIX) 40 MG tablet Take 1 tablet by mouth daily.     carvedilol (COREG) 3.125 MG tablet Take 1 tablet (3.125 mg total) by mouth 2 (two) times daily with a meal. 180 tablet 3   No current facility-administered medications for this visit.    Allergies: Latex, Lidocaine, Peanut-containing drug products, Statins, Sulfa antibiotics, Sulfasalazine, Wound dressings, Metformin and related, Reglan [metoclopramide], Adhesive [tape], Ciprofloxacin, and Elemental sulfur  Past Medical History:  Diagnosis Date   Abnormal Pap smear of cervix    h/o colposcopy/biopsy   Allergic rhinitis 08/13/2014   Allergy    Allergy with anaphylaxis due to peanuts 08/13/2014   Anal skin tag 04/12/2021   Asthma    HX   Benign neoplasm of colon 10/03/2009   Bilious vomiting with nausea 01/01/2022   Breast cancer (HCC)    Cancer (HCC) 1996-1997   ovarian   Chronic diarrhea 10/01/2021   Diabetes (HCC) 09/04/2015   Diabetes mellitus without complication (HCC) 07/2015   diagnosed by PCP, Dr. Leontine Rana    Diabetic gastroparesis (HCC) 02/03/2022   Diarrhea post cholecystectomy since 1999 08/13/2014   Dyslipidemia 07/31/2020   Esophageal dysphagia 01/01/2022   Family history of breast cancer 01/06/2012   Family history of malignant neoplasm of ovary 01/06/2012   Fascial hernia 11/29/2020   FHx: colon cancer  father  08/13/2014   Gastroesophageal reflux disease without esophagitis 08/13/2014   GERD (gastroesophageal reflux disease)    Headache, migraine 08/02/2012   Overview:  IMO Load 2016 R1.3   Hyperlipemia    on medication    Increased risk of breast cancer 07/24/2022   Irritable bowel syndrome with diarrhea 01/01/2022   Nausea 12/21/2013   PONV (postoperative nausea and vomiting)    Rectal bleeding 09/20/2015   Rectal pain 09/20/2015   Right shoulder pain 12/18/2016   Right  upper quadrant pain 11/03/2006   S/P endometrial ablation 08/13/2014   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   Overview:   In 1996, found while pregnant     In 1996, found while pregnant  Overview:   Overview:   In 1996, found while pregnant   Tachycardia 08/05/2017   Type 2 diabetes mellitus without complication, without long-term current use of insulin (HCC) 07/31/2020   Wellness examination 11/21/2015   Wrist sprain, left, subsequent encounter 08/14/2020    Past Surgical History:  Procedure Laterality Date   APPENDECTOMY     age [age]   BREAST EXCISIONAL BIOPSY Left 08/2019   BREAST LUMPECTOMY Left 2008   left breast   CHOLECYSTECTOMY  2000   ENDOMETRIAL ABLATION  2009   GASTRECTOMY  06/08/2023   LAPAROSCOPIC SALPINGO OOPHERECTOMY Left 1996   with right tubal ligation   RADIOACTIVE SEED GUIDED EXCISIONAL BREAST BIOPSY Left 09/13/2019   Procedure: RADIOACTIVE SEED GUIDED EXCISIONAL LEFT BREAST BIOPSY;  Surgeon: Enid Harry, MD;  Location: [facility];  Service: General;  Laterality: Left;   TUBAL LIGATION      Family History  Problem Relation Age of Onset   Diabetes Mother    Colon cancer Father    Breast cancer Maternal Aunt    Ovarian cancer Maternal Aunt    Ovarian cancer Cousin    Colon cancer Cousin    Melanoma Cousin    Breast cancer Maternal Grandmother    Cancer Other        breast and ovarian-maternal side    Social History   Tobacco Use   Smoking status: Never   Smokeless tobacco: Never   Tobacco comments:    NEVER USED TOBACCO  Substance Use Topics   Alcohol use: Yes    Alcohol/week: 1.0 standard drink of alcohol    Types: 1 Standard drinks or equivalent per week    Subjective:   Seen for pre-op clearance; scheduled to meet with plastic surgeon to discuss treatment options; had gastric bypass due to gastroparesis; has done very well but concerned about skin appearance; no acute concerns today; notes she is not sure she will  actually have the surgery;     Objective:  Vitals:   10/30/23 1115  BP: 108/60  Pulse: 87  SpO2: 98%  Weight: 123 lb 8 oz (56 kg)  Height: 5\' 4"  (1.626 m)    General: Well developed, well nourished, in no acute distress  Skin : Warm and dry.  Head: Normocephalic and atraumatic  Eyes: Sclera and conjunctiva clear; pupils round and reactive to light; extraocular movements intact  Ears: External normal; canals clear; tympanic membranes normal  Oropharynx: Pink, supple. No suspicious lesions  Neck: Supple without thyromegaly, adenopathy  Lungs: Respirations unlabored; clear to auscultation bilaterally without wheeze, rales, rhonchi  CVS exam: normal rate and regular rhythm.  Neurologic: Alert and oriented; speech intact; face symmetrical; moves all extremities well; CNII-XII intact without focal deficit   Assessment:  1. Tachycardia   2. Pre-operative clearance     Plan:   Good response to Coreg- refill updated; EKG shows sinus rhythm- no changes compared to previous  EKGs; pre-op clearance given with no concerns;     No follow-ups on file.  Orders Placed This Encounter  Procedures   EKG 12-Lead    Requested Prescriptions   Signed Prescriptions Disp Refills   carvedilol (COREG) 3.125 MG tablet 180 tablet 3    Sig: Take 1 tablet (3.125 mg total) by mouth 2 (two) times daily with a meal.

## 2023-11-09 ENCOUNTER — Encounter: Payer: Self-pay | Admitting: Family

## 2023-11-23 LAB — OPHTHALMOLOGY REPORT-SCANNED

## 2023-12-16 ENCOUNTER — Institutional Professional Consult (permissible substitution): Admitting: Plastic Surgery

## 2023-12-22 ENCOUNTER — Encounter: Payer: Self-pay | Admitting: Family

## 2023-12-22 ENCOUNTER — Ambulatory Visit: Admitting: Family

## 2023-12-22 VITALS — BP 104/68 | HR 78 | Ht 64.0 in | Wt 115.8 lb

## 2023-12-22 DIAGNOSIS — Z01818 Encounter for other preprocedural examination: Secondary | ICD-10-CM | POA: Diagnosis not present

## 2023-12-22 DIAGNOSIS — E119 Type 2 diabetes mellitus without complications: Secondary | ICD-10-CM

## 2023-12-23 ENCOUNTER — Ambulatory Visit: Payer: Self-pay | Admitting: Family

## 2023-12-23 DIAGNOSIS — R7989 Other specified abnormal findings of blood chemistry: Secondary | ICD-10-CM

## 2023-12-23 LAB — CBC WITH DIFFERENTIAL/PLATELET
Basophils Absolute: 0.1 10*3/uL (ref 0.0–0.1)
Basophils Relative: 0.7 % (ref 0.0–3.0)
Eosinophils Absolute: 0.1 10*3/uL (ref 0.0–0.7)
Eosinophils Relative: 1.5 % (ref 0.0–5.0)
HCT: 41.9 % (ref 36.0–46.0)
Hemoglobin: 13.9 g/dL (ref 12.0–15.0)
Lymphocytes Relative: 31.5 % (ref 12.0–46.0)
Lymphs Abs: 2.5 10*3/uL (ref 0.7–4.0)
MCHC: 33.1 g/dL (ref 30.0–36.0)
MCV: 90.3 fl (ref 78.0–100.0)
Monocytes Absolute: 0.4 10*3/uL (ref 0.1–1.0)
Monocytes Relative: 5 % (ref 3.0–12.0)
Neutro Abs: 4.8 10*3/uL (ref 1.4–7.7)
Neutrophils Relative %: 61.3 % (ref 43.0–77.0)
Platelets: 251 10*3/uL (ref 150.0–400.0)
RBC: 4.64 Mil/uL (ref 3.87–5.11)
RDW: 13.6 % (ref 11.5–15.5)
WBC: 7.8 10*3/uL (ref 4.0–10.5)

## 2023-12-23 LAB — COMPREHENSIVE METABOLIC PANEL WITH GFR
ALT: 58 U/L — ABNORMAL HIGH (ref 0–35)
AST: 44 U/L — ABNORMAL HIGH (ref 0–37)
Albumin: 4.4 g/dL (ref 3.5–5.2)
Alkaline Phosphatase: 77 U/L (ref 39–117)
BUN: 22 mg/dL (ref 6–23)
CO2: 31 meq/L (ref 19–32)
Calcium: 9.8 mg/dL (ref 8.4–10.5)
Chloride: 100 meq/L (ref 96–112)
Creatinine, Ser: 0.73 mg/dL (ref 0.40–1.20)
GFR: 94.49 mL/min (ref >60.00)
Glucose, Bld: 99 mg/dL (ref 70–99)
Potassium: 4.1 meq/L (ref 3.5–5.1)
Sodium: 140 meq/L (ref 135–145)
Total Bilirubin: 0.4 mg/dL (ref 0.2–1.2)
Total Protein: 6.7 g/dL (ref 6.0–8.3)

## 2023-12-23 LAB — HEMOGLOBIN A1C: Hgb A1c MFr Bld: 6.5 % (ref 4.6–6.5)

## 2023-12-23 NOTE — Progress Notes (Signed)
 Carolyn Wilson is a 53 y.o. female with the following history as recorded in Epic[employer]:  Patient Active Problem List   Diagnosis Date Noted   Stress fracture of right fibula 12/10/2022   Chest pain of uncertain etiology 09/16/2022   Obesity (BMI 30.0-34.9) 09/16/2022   Allergy 09/11/2022   Asthma 09/11/2022   Cancer (HCC) 09/11/2022   Hyperlipemia 09/11/2022   PONV (postoperative nausea and vomiting) 09/11/2022   Increased risk of breast cancer 07/24/2022   Diabetic gastroparesis (HCC) 02/03/2022   Bilious vomiting with nausea 01/01/2022   Irritable bowel syndrome with diarrhea 01/01/2022   Esophageal dysphagia 01/01/2022   Chronic diarrhea 10/01/2021   Anal skin tag 04/12/2021   Fascial hernia 11/29/2020   Wrist sprain, left, subsequent encounter 08/14/2020   Dyslipidemia 07/31/2020   Type 2 diabetes mellitus without complication, without long-term current use of insulin (HCC) 07/31/2020   Tachycardia 08/05/2017   Right shoulder pain 12/18/2016   Wellness examination 11/21/2015   Rectal bleeding 09/20/2015   Rectal pain 09/20/2015   Diabetes (HCC) 09/04/2015   Diabetes mellitus without complication (HCC) 07/2015   Allergy with anaphylaxis due to peanuts 08/13/2014   Abnormal Pap smear of cervix  remote Leep  1996 per pt 08/13/2014   Allergic rhinitis 08/13/2014   S/P endometrial ablation 08/13/2014   Gastroesophageal reflux disease without esophagitis 08/13/2014   Diarrhea post cholecystectomy since 1999 08/13/2014   FHx: colon cancer  father  08/13/2014   Nausea 12/21/2013   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 180 tablet 3   Multiple Vitamin (MULTIVITAMIN) capsule Take 1 capsule by mouth daily.     ondansetron  (ZOFRAN -ODT) 8 MG disintegrating tablet DISSOLVE ONE TABLET BY MOUTH EVERY 8 HOURS AS NEEDED FOR NAUSEA AND VOMITING 20 tablet 0   pantoprazole (PROTONIX) 40 MG tablet Take 1 tablet by mouth daily.     No current facility-administered medications for this visit.    Allergies: Latex, Lidocaine , Peanut-containing drug products, Statins, Sulfa antibiotics, Sulfasalazine, Wound dressings, Metformin  and related, Reglan [metoclopramide], Adhesive [tape], Ciprofloxacin, and Elemental sulfur  Past Medical History:  Diagnosis Date   Abnormal Pap smear of cervix    h/o colposcopy/biopsy   Allergic rhinitis 08/13/2014   Allergy    Allergy with anaphylaxis due to peanuts 08/13/2014   Anal skin tag 04/12/2021   Asthma    HX   Benign neoplasm of colon 10/03/2009   Bilious vomiting with nausea 01/01/2022   Breast cancer (HCC)    Cancer (HCC) 1996-1997   ovarian   Chronic diarrhea 10/01/2021   Diabetes (HCC) 09/04/2015   Diabetes mellitus without complication (HCC) 07/2015   diagnosed by PCP, Dr. Leontine Rana    Diabetic gastroparesis (HCC) 02/03/2022   Diarrhea post cholecystectomy since 1999 08/13/2014   Dyslipidemia 07/31/2020   Esophageal dysphagia 01/01/2022   Family history of breast cancer 01/06/2012   Family history of malignant neoplasm of ovary 01/06/2012   Fascial hernia 11/29/2020   FHx: colon cancer  father  08/13/2014   Gastroesophageal reflux disease without esophagitis 08/13/2014   GERD (gastroesophageal reflux disease)    Headache, migraine 08/02/2012   Overview:  IMO Load 2016 R1.3   Hyperlipemia    on medication    Increased risk of breast cancer 07/24/2022   Irritable bowel syndrome with diarrhea 01/01/2022   Nausea 12/21/2013   PONV (postoperative nausea and vomiting)    Rectal bleeding 09/20/2015   Rectal pain 09/20/2015   Right shoulder pain 12/18/2016   Right  upper quadrant pain 11/03/2006   S/P endometrial ablation 08/13/2014   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   Overview:   In 1996, found while pregnant     In 1996, found while pregnant  Overview:   Overview:   In 1996, found while pregnant   Tachycardia 08/05/2017   Type 2 diabetes mellitus without complication, without long-term current use of insulin (HCC) 07/31/2020   Wellness examination 11/21/2015   Wrist sprain, left, subsequent encounter 08/14/2020    Past Surgical History:  Procedure Laterality Date   APPENDECTOMY     age [age]   BREAST EXCISIONAL BIOPSY Left 08/2019   BREAST LUMPECTOMY Left 2008   left breast   CHOLECYSTECTOMY  2000   ENDOMETRIAL ABLATION  2009   GASTRECTOMY  06/08/2023   LAPAROSCOPIC SALPINGO OOPHERECTOMY Left 1996   with right tubal ligation   RADIOACTIVE SEED GUIDED EXCISIONAL BREAST BIOPSY Left 09/13/2019   Procedure: RADIOACTIVE SEED GUIDED EXCISIONAL LEFT BREAST BIOPSY;  Surgeon: Enid Harry, MD;  Location: [facility];  Service: General;  Laterality: Left;   TUBAL LIGATION      Family History  Problem Relation Age of Onset   Diabetes Mother    Colon cancer Father    Breast cancer Maternal Aunt    Ovarian cancer Maternal Aunt    Ovarian cancer Cousin    Colon cancer Cousin    Melanoma Cousin    Breast cancer Maternal Grandmother    Cancer Other        breast and ovarian-maternal side    Social History   Tobacco Use   Smoking status: Never   Smokeless tobacco: Never   Tobacco comments:    NEVER USED TOBACCO  Substance Use Topics   Alcohol use: Yes    Alcohol/week: 1.0 standard drink of alcohol    Types: 1 Standard drinks or equivalent per week    Subjective:   Patient will be having plastic surgery to help offset some of the change secondary to original surgery for gastroparesis. She is doing very well and needs pre-op clearance. She is off all of her medications related to diabetes after losing weight.  Needs labs and EKG updated today;    Objective:  Vitals:   12/22/23 1507  BP: 104/68  Pulse: 78  SpO2: 99%  Weight: 115 lb 12.8 oz (52.5 kg)  Height: 5\' 4"  (1.626 m)    General: Well developed, well nourished, in no acute distress  Skin : Warm and dry.  Head: Normocephalic and atraumatic  Lungs: Respirations unlabored; clear to auscultation bilaterally without wheeze, rales, rhonchi  CVS exam: normal rate and regular rhythm.  Musculoskeletal: No deformities; no active joint inflammation  Extremities: No edema, cyanosis, clubbing  Vessels: Symmetric bilaterally  Neurologic: Alert and oriented; speech intact; face symmetrical; moves all extremities well; CNII-XII intact without focal deficit  Assessment:  1. Type 2 diabetes mellitus without complication, without long-term current use of insulin (HCC)   2. Preop testing     Plan:  Patient doing very well; EKG updated as requested- shows sinus rhythm; labs will be drawn as requested; clearance provided with no concerns;  No follow-ups on file.  Orders Placed This Encounter  Procedures   CBC with Differential/Platelet   Comp Met (CMET)   Hemoglobin A1c   EKG 12-Lead    Requested Prescriptions    No prescriptions requested or ordered in this encounter

## 2024-01-06 ENCOUNTER — Other Ambulatory Visit: Payer: Self-pay | Admitting: Family

## 2024-01-06 ENCOUNTER — Other Ambulatory Visit (INDEPENDENT_AMBULATORY_CARE_PROVIDER_SITE_OTHER)

## 2024-01-06 ENCOUNTER — Ambulatory Visit: Payer: Self-pay | Admitting: Family

## 2024-01-06 DIAGNOSIS — R7989 Other specified abnormal findings of blood chemistry: Secondary | ICD-10-CM | POA: Diagnosis not present

## 2024-01-06 LAB — HEPATIC FUNCTION PANEL
ALT: 105 U/L — ABNORMAL HIGH (ref 0–35)
AST: 47 U/L — ABNORMAL HIGH (ref 0–37)
Albumin: 4.1 g/dL (ref 3.5–5.2)
Alkaline Phosphatase: 76 U/L (ref 39–117)
Bilirubin, Direct: 0.1 mg/dL (ref 0.0–0.3)
Total Bilirubin: 0.6 mg/dL (ref 0.2–1.2)
Total Protein: 6.5 g/dL (ref 6.0–8.3)

## 2024-01-29 ENCOUNTER — Telehealth (HOSPITAL_BASED_OUTPATIENT_CLINIC_OR_DEPARTMENT_OTHER): Payer: Self-pay

## 2024-02-19 ENCOUNTER — Institutional Professional Consult (permissible substitution): Admitting: Plastic Surgery

## 2024-02-22 ENCOUNTER — Encounter: Payer: Self-pay | Admitting: Family

## 2024-02-23 ENCOUNTER — Other Ambulatory Visit: Payer: Self-pay | Admitting: Family

## 2024-02-23 MED ORDER — EPINEPHRINE 0.3 MG/0.3ML IJ SOAJ: 0.3000 mg | INTRAMUSCULAR | 1 refills | Status: AC | PRN

## 2024-03-11 ENCOUNTER — Ambulatory Visit (INDEPENDENT_AMBULATORY_CARE_PROVIDER_SITE_OTHER): Admitting: Family

## 2024-03-11 ENCOUNTER — Encounter: Payer: Self-pay | Admitting: Family

## 2024-03-11 VITALS — BP 98/64 | HR 89 | Ht 64.0 in | Wt 114.2 lb

## 2024-03-11 DIAGNOSIS — Z0000 Encounter for general adult medical examination without abnormal findings: Secondary | ICD-10-CM

## 2024-03-11 NOTE — Patient Instructions (Signed)
 Please schedule with Waddell or Eleanor;

## 2024-03-11 NOTE — Progress Notes (Signed)
 Carolyn Wilson is a 53 y.o. female with the following history as recorded in EpicCare:  Patient Active Problem List   Diagnosis Date Noted   Stress fracture of right fibula 12/10/2022   Chest pain of uncertain etiology 09/16/2022   Obesity (BMI 30.0-34.9) 09/16/2022   Allergy 09/11/2022   Asthma 09/11/2022   Cancer (HCC) 09/11/2022   Hyperlipemia 09/11/2022   PONV (postoperative nausea and vomiting) 09/11/2022   Increased risk of breast cancer 07/24/2022   Diabetic gastroparesis (HCC) 02/03/2022   Bilious vomiting with nausea 01/01/2022   Irritable bowel syndrome with diarrhea 01/01/2022   Esophageal dysphagia 01/01/2022   Chronic diarrhea 10/01/2021   Anal skin tag 04/12/2021   Fascial hernia 11/29/2020   Wrist sprain, left, subsequent encounter 08/14/2020   Dyslipidemia 07/31/2020   Type 2 diabetes mellitus without complication, without long-term current use of insulin (HCC) 07/31/2020   Tachycardia 08/05/2017   Right shoulder pain 12/18/2016   Wellness examination 11/21/2015   Rectal bleeding 09/20/2015   Rectal pain 09/20/2015   Diabetes (HCC) 09/04/2015   Diabetes mellitus without complication (HCC) 07/2015   Allergy with anaphylaxis due to peanuts 08/13/2014   Abnormal Pap smear of cervix  remote Leep  1996 per pt 08/13/2014   Allergic rhinitis 08/13/2014   S/P endometrial ablation 08/13/2014   Gastroesophageal reflux disease without esophagitis 08/13/2014   Diarrhea post cholecystectomy since 1999 08/13/2014   FHx: colon cancer  father  08/13/2014   Nausea 12/21/2013   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 180 tablet 3   EPINEPHrine  0.3 mg/0.3 mL IJ SOAJ injection Inject 0.3 mg into the muscle as needed for anaphylaxis. 2 each 1   Multiple Vitamin (MULTIVITAMIN) capsule Take 1 capsule by mouth daily.     ondansetron  (ZOFRAN -ODT) 8 MG disintegrating tablet DISSOLVE ONE TABLET BY MOUTH EVERY 8 HOURS AS NEEDED FOR NAUSEA AND VOMITING 20 tablet 0   pantoprazole (PROTONIX) 40 MG tablet Take 1 tablet by mouth daily.     No current facility-administered medications for this visit.    Allergies: Latex, Lidocaine , Peanut-containing drug products, Statins, Sulfa antibiotics, Sulfasalazine, Wound dressings, Metformin  and related, Reglan [metoclopramide], Adhesive [tape], Ciprofloxacin, and Elemental sulfur  Past Medical History:  Diagnosis Date   Abnormal Pap smear of cervix    h/o colposcopy/biopsy   Allergic rhinitis 08/13/2014   Allergy    Allergy with anaphylaxis due to peanuts 08/13/2014   Anal skin tag 04/12/2021   Asthma    HX   Benign neoplasm of colon 10/03/2009   Bilious vomiting with nausea 01/01/2022   Breast cancer (HCC)    Cancer (HCC) 1996-1997   ovarian   Chronic diarrhea 10/01/2021   Diabetes (HCC) 09/04/2015   Diabetes mellitus without complication (HCC) 07/2015   diagnosed by PCP, Dr. Raiford    Diabetic gastroparesis (HCC) 02/03/2022   Diarrhea post cholecystectomy since 1999 08/13/2014   Dyslipidemia 07/31/2020   Esophageal dysphagia 01/01/2022   Family history of breast cancer 01/06/2012   Family history of malignant neoplasm of ovary 01/06/2012   Fascial hernia 11/29/2020   FHx: colon cancer  father  08/13/2014  Gastroesophageal reflux disease without esophagitis 08/13/2014   GERD (gastroesophageal reflux disease)    Headache, migraine 08/02/2012   Overview:   IMO Load 2016 R1.3   Hyperlipemia    on medication    Increased risk of breast cancer 07/24/2022   Irritable bowel syndrome with diarrhea 01/01/2022   Nausea 12/21/2013   PONV (postoperative nausea  and vomiting)    Rectal bleeding 09/20/2015   Rectal pain 09/20/2015   Right shoulder pain 12/18/2016   Right upper quadrant pain 11/03/2006   S/P endometrial ablation 08/13/2014   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   Overview:   In 1996, found while pregnant     In 1996, found while pregnant  Overview:   Overview:   In 1996, found while pregnant   Tachycardia 08/05/2017   Type 2 diabetes mellitus without complication, without long-term current use of insulin (HCC) 07/31/2020   Wellness examination 11/21/2015   Wrist sprain, left, subsequent encounter 08/14/2020    Past Surgical History:  Procedure Laterality Date   APPENDECTOMY     age [age]   BREAST EXCISIONAL BIOPSY Left 08/2019   BREAST LUMPECTOMY Left 2008   left breast   CHOLECYSTECTOMY  2000   ENDOMETRIAL ABLATION  2009   GASTRECTOMY  06/08/2023   LAPAROSCOPIC SALPINGO OOPHERECTOMY Left 1996   with right tubal ligation   RADIOACTIVE SEED GUIDED EXCISIONAL BREAST BIOPSY Left 09/13/2019   Procedure: RADIOACTIVE SEED GUIDED EXCISIONAL LEFT BREAST BIOPSY;  Surgeon: Ebbie Cough, MD;  Location:  [facility];  Service: General;  Laterality: Left;   TUBAL LIGATION      Family History  Problem Relation Age of Onset   Diabetes Mother    Colon cancer Father    Breast cancer Maternal Aunt    Ovarian cancer Maternal Aunt    Ovarian cancer Cousin    Colon cancer Cousin    Melanoma Cousin    Breast cancer Maternal Grandmother    Cancer Other        breast and ovarian-maternal side    Social History   Tobacco Use   Smoking status: Never   Smokeless tobacco: Never   Tobacco comments:    NEVER USED TOBACCO  Substance Use Topics   Alcohol use: Yes    Alcohol/week: 1.0 standard drink of alcohol    Types: 1 Standard drinks or equivalent per week    Subjective:   Presents for yearly CPE today; no acute concerns today;  Continuing to work with bariatric specialist regularly- they are watching her  closely with continued weight loss and have encouraged increased calories/ decreased exercise;  She is also working with her GI for follow up on elevated liver functions;   Review of Systems  Constitutional: Negative.   HENT: Negative.    Eyes: Negative.   Respiratory: Negative.    Cardiovascular: Negative.   Gastrointestinal: Negative.   Genitourinary: Negative.   Musculoskeletal: Negative.   Skin: Negative.   Neurological: Negative.   Endo/Heme/Allergies: Negative.   Psychiatric/Behavioral: Negative.        Objective:  Vitals:   03/11/24 1326  BP: 98/64  Pulse: 89  SpO2: 99%  Weight: 114 lb 3.2 oz (51.8 kg)  Height: 5' 4 (1.626 m)    General: Well developed, well nourished, in no acute distress  Skin : Warm and dry.  Head: Normocephalic and atraumatic  Eyes: Sclera and conjunctiva clear; pupils round and reactive to light; extraocular movements intact  Ears: External normal; canals clear; tympanic membranes normal  Oropharynx: Pink, supple. No suspicious lesions  Neck: Supple without thyromegaly, adenopathy  Lungs: Respirations unlabored; clear to auscultation bilaterally without wheeze, rales, rhonchi  CVS exam: normal rate and regular rhythm.  Abdomen: Soft; nontender; nondistended; normoactive bowel sounds; no masses or hepatosplenomegaly  Musculoskeletal: No deformities; no active joint inflammation  Extremities: No edema, cyanosis, clubbing  Vessels: Symmetric bilaterally  Neurologic: Alert and oriented; speech intact; face symmetrical; moves all extremities well; CNII-XII intact without focal deficit   Assessment:  1. PE (physical exam), annual     Plan:  Age appropriate preventive healthcare needs addressed; encouraged regular eye doctor and dental exams; encouraged regular exercise; will update labs and refills as needed today; follow-up in 1 year, sooner prn. Continue with bariatric provider and GI for close follow up as planned.    No follow-ups on  file.  No orders of the defined types were placed in this encounter.   Requested Prescriptions    No prescriptions requested or ordered in this encounter

## 2024-03-15 ENCOUNTER — Other Ambulatory Visit: Payer: Self-pay | Admitting: Obstetrics & Gynecology

## 2024-03-15 DIAGNOSIS — Z1231 Encounter for screening mammogram for malignant neoplasm of breast: Secondary | ICD-10-CM

## 2024-03-16 ENCOUNTER — Encounter: Payer: Self-pay | Admitting: Family

## 2024-03-30 ENCOUNTER — Ambulatory Visit: Admitting: Hematology & Oncology

## 2024-03-30 ENCOUNTER — Inpatient Hospital Stay

## 2024-04-04 ENCOUNTER — Ambulatory Visit

## 2024-04-05 ENCOUNTER — Inpatient Hospital Stay: Attending: Hematology & Oncology

## 2024-04-05 ENCOUNTER — Inpatient Hospital Stay: Admitting: Hematology & Oncology

## 2024-04-05 ENCOUNTER — Encounter: Payer: Self-pay | Admitting: Hematology & Oncology

## 2024-04-05 ENCOUNTER — Ambulatory Visit: Payer: Self-pay | Admitting: Hematology & Oncology

## 2024-04-05 ENCOUNTER — Other Ambulatory Visit: Payer: Self-pay

## 2024-04-05 VITALS — BP 100/65 | HR 79 | Temp 98.7°F | Resp 16 | Ht 64.0 in | Wt 111.0 lb

## 2024-04-05 DIAGNOSIS — R634 Abnormal weight loss: Secondary | ICD-10-CM | POA: Diagnosis not present

## 2024-04-05 DIAGNOSIS — Z803 Family history of malignant neoplasm of breast: Secondary | ICD-10-CM | POA: Diagnosis not present

## 2024-04-05 DIAGNOSIS — Z8041 Family history of malignant neoplasm of ovary: Secondary | ICD-10-CM | POA: Diagnosis not present

## 2024-04-05 DIAGNOSIS — N6092 Unspecified benign mammary dysplasia of left breast: Secondary | ICD-10-CM | POA: Insufficient documentation

## 2024-04-05 DIAGNOSIS — Z8543 Personal history of malignant neoplasm of ovary: Secondary | ICD-10-CM | POA: Insufficient documentation

## 2024-04-05 DIAGNOSIS — E0800 Diabetes mellitus due to underlying condition with hyperosmolarity without nonketotic hyperglycemic-hyperosmolar coma (NKHHC): Secondary | ICD-10-CM

## 2024-04-05 DIAGNOSIS — E1100 Type 2 diabetes mellitus with hyperosmolarity without nonketotic hyperglycemic-hyperosmolar coma (NKHHC): Secondary | ICD-10-CM | POA: Diagnosis not present

## 2024-04-05 DIAGNOSIS — D271 Benign neoplasm of left ovary: Secondary | ICD-10-CM

## 2024-04-05 DIAGNOSIS — K219 Gastro-esophageal reflux disease without esophagitis: Secondary | ICD-10-CM

## 2024-04-05 DIAGNOSIS — Z794 Long term (current) use of insulin: Secondary | ICD-10-CM

## 2024-04-05 DIAGNOSIS — R945 Abnormal results of liver function studies: Secondary | ICD-10-CM | POA: Diagnosis present

## 2024-04-05 LAB — CMP (CANCER CENTER ONLY)
ALT: 181 U/L — ABNORMAL HIGH (ref 0–44)
AST: 96 U/L — ABNORMAL HIGH (ref 15–41)
Albumin: 4.2 g/dL (ref 3.5–5.0)
Alkaline Phosphatase: 82 U/L (ref 38–126)
Anion gap: 8 (ref 5–15)
BUN: 19 mg/dL (ref 6–20)
CO2: 29 mmol/L (ref 22–32)
Calcium: 9.5 mg/dL (ref 8.9–10.3)
Chloride: 102 mmol/L (ref 98–111)
Creatinine: 0.91 mg/dL (ref 0.44–1.00)
GFR, Estimated: >60 mL/min (ref >60)
Glucose, Bld: 193 mg/dL — ABNORMAL HIGH (ref 70–99)
Potassium: 3.9 mmol/L (ref 3.5–5.1)
Sodium: 140 mmol/L (ref 135–145)
Total Bilirubin: 0.4 mg/dL (ref 0.0–1.2)
Total Protein: 6.5 g/dL (ref 6.5–8.1)

## 2024-04-05 LAB — CBC WITH DIFFERENTIAL (CANCER CENTER ONLY)
Abs Immature Granulocytes: 0.01 K/uL (ref 0.00–0.07)
Basophils Absolute: 0 K/uL (ref 0.0–0.1)
Basophils Relative: 1 %
Eosinophils Absolute: 0.2 K/uL (ref 0.0–0.5)
Eosinophils Relative: 3 %
HCT: 42.5 % (ref 36.0–46.0)
Hemoglobin: 14 g/dL (ref 12.0–15.0)
Immature Granulocytes: 0 %
Lymphocytes Relative: 37 %
Lymphs Abs: 2 K/uL (ref 0.7–4.0)
MCH: 30 pg (ref 26.0–34.0)
MCHC: 32.9 g/dL (ref 30.0–36.0)
MCV: 91 fL (ref 80.0–100.0)
Monocytes Absolute: 0.3 K/uL (ref 0.1–1.0)
Monocytes Relative: 6 %
Neutro Abs: 2.9 K/uL (ref 1.7–7.7)
Neutrophils Relative %: 53 %
Platelet Count: 219 K/uL (ref 150–400)
RBC: 4.67 MIL/uL (ref 3.87–5.11)
RDW: 13.2 % (ref 11.5–15.5)
WBC Count: 5.4 K/uL (ref 4.0–10.5)
nRBC: 0 % (ref 0.0–0.2)

## 2024-04-05 LAB — VITAMIN B12: Vitamin B-12: 533 pg/mL (ref 180–914)

## 2024-04-05 LAB — IRON AND IRON BINDING CAPACITY (CC-WL,HP ONLY)
Iron: 108 ug/dL (ref 28–170)
Saturation Ratios: 32 % — ABNORMAL HIGH (ref 10.4–31.8)
TIBC: 337 ug/dL (ref 250–450)
UIBC: 229 ug/dL

## 2024-04-05 LAB — RETICULOCYTES
Immature Retic Fract: 6.3 % (ref 2.3–15.9)
RBC.: 4.56 MIL/uL (ref 3.87–5.11)
Retic Count, Absolute: 36.9 K/uL (ref 19.0–186.0)
Retic Ct Pct: 0.8 % (ref 0.4–3.1)

## 2024-04-05 LAB — FERRITIN: Ferritin: 38 ng/mL (ref 11–307)
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
## 2024-04-12 ENCOUNTER — Ambulatory Visit
Admission: RE | Admit: 2024-04-12 | Discharge: 2024-04-12 | Disposition: A | Discharge status: Home / Self Care | Source: Ambulatory Visit | Attending: Obstetrics & Gynecology | Admitting: Obstetrics & Gynecology

## 2024-04-12 DIAGNOSIS — Z1231 Encounter for screening mammogram for malignant neoplasm of breast: Secondary | ICD-10-CM

## 2024-05-20 ENCOUNTER — Other Ambulatory Visit: Payer: Self-pay | Admitting: Medical Genetics

## 2024-05-20 DIAGNOSIS — Z006 Encounter for examination for normal comparison and control in clinical research program: Secondary | ICD-10-CM

## 2024-06-08 ENCOUNTER — Ambulatory Visit (HOSPITAL_BASED_OUTPATIENT_CLINIC_OR_DEPARTMENT_OTHER)
Admission: RE | Admit: 2024-06-08 | Discharge: 2024-06-08 | Disposition: A | Discharge status: Home / Self Care | Source: Ambulatory Visit | Attending: Family Medicine | Admitting: Family Medicine

## 2024-06-08 ENCOUNTER — Encounter: Payer: Self-pay | Admitting: Family Medicine

## 2024-06-08 ENCOUNTER — Ambulatory Visit: Payer: Self-pay | Admitting: Family Medicine

## 2024-06-08 ENCOUNTER — Ambulatory Visit: Admitting: Family Medicine

## 2024-06-08 VITALS — BP 89/69 | HR 84 | Ht 64.0 in | Wt 110.0 lb

## 2024-06-08 DIAGNOSIS — E119 Type 2 diabetes mellitus without complications: Secondary | ICD-10-CM

## 2024-06-08 DIAGNOSIS — R110 Nausea: Secondary | ICD-10-CM

## 2024-06-08 DIAGNOSIS — R000 Tachycardia, unspecified: Secondary | ICD-10-CM

## 2024-06-08 DIAGNOSIS — Z0000 Encounter for general adult medical examination without abnormal findings: Secondary | ICD-10-CM

## 2024-06-08 DIAGNOSIS — M79604 Pain in right leg: Secondary | ICD-10-CM

## 2024-06-08 DIAGNOSIS — Z23 Encounter for immunization: Secondary | ICD-10-CM

## 2024-06-08 DIAGNOSIS — R748 Abnormal levels of other serum enzymes: Secondary | ICD-10-CM | POA: Insufficient documentation

## 2024-06-08 LAB — COMPREHENSIVE METABOLIC PANEL WITH GFR
ALT: 108 U/L — ABNORMAL HIGH (ref 0–35)
AST: 65 U/L — ABNORMAL HIGH (ref 0–37)
Albumin: 4.3 g/dL (ref 3.5–5.2)
Alkaline Phosphatase: 67 U/L (ref 39–117)
BUN: 21 mg/dL (ref 6–23)
CO2: 36 meq/L — ABNORMAL HIGH (ref 19–32)
Calcium: 9.8 mg/dL (ref 8.4–10.5)
Chloride: 99 meq/L (ref 96–112)
Creatinine, Ser: 0.8 mg/dL (ref 0.40–1.20)
GFR: 84.38 mL/min (ref >60.00)
Glucose, Bld: 94 mg/dL (ref 70–99)
Potassium: 4.4 meq/L (ref 3.5–5.1)
Sodium: 138 meq/L (ref 135–145)
Total Bilirubin: 0.5 mg/dL (ref 0.2–1.2)
Total Protein: 6.6 g/dL (ref 6.0–8.3)

## 2024-06-08 LAB — MICROALBUMIN / CREATININE URINE RATIO
Creatinine,U: 36.5 mg/dL
Microalb Creat Ratio: UNDETERMINED mg/g (ref 0.0–30.0)
Microalb, Ur: <0.7 mg/dL

## 2024-06-08 MED ORDER — ONDANSETRON 8 MG PO TBDP: 8.0000 mg | ORAL_TABLET | Freq: Three times a day (TID) | ORAL | 1 refills | Status: AC | PRN

## 2024-06-08 NOTE — Assessment & Plan Note (Signed)
 Elevated liver enzymes under GI management. Recent liver biopsy showed no cirrhosis.  - Ordered metabolic panel to check liver enzymes. Will forward results to her specialist. - Continue follow-up with GI specialist.

## 2024-06-08 NOTE — Assessment & Plan Note (Signed)
 Occasionally needs Zofran 

## 2024-06-08 NOTE — Progress Notes (Signed)
 New Patient Office Visit   Subjective     Patient ID: Carolyn Wilson, female   DOB: 17-Nov-1970  Age: 53 y.o. MRN: [id number]   CC:  Chief Complaint  Patient presents with   Establish Care      HPI [facility] presents to establish/transfer care  Discussed the use of AI scribe software for clinical note transcription with the patient, who gave verbal consent to proceed.  History of Present Illness Carolyn Wilson is a 53 year old female who presents for a follow-up visit.  She has a history of breast cancer treated with a left breast lumpectomy and ovarian cancer with the left ovary removed. She underwent endometrial ablation and tubal ligation. She is postmenopausal and sexually active.  She has a history of diabetes, which resolved following gastric surgery in November of the previous year. Her pre-surgery weight was 178 pounds, and she now weighs 111 pounds. Her blood sugar levels have normalized post-surgery, with a last A1c of 6.1% earlier this year.  She has a history of gastroparesis and underwent a subtotal gastrectomy with a Roux-en-Y gastric surgery last year. She reports significant improvement in her symptoms post-surgery, including resolution of her diabetes and high cholesterol.  She experiences dizziness since her surgery, which she attributes to position changes. She reports experiencing dizziness since her surgery, which she attributes to position changes, and she is currently on carvedilol  for heart rate control. She was previously on metoprolol , which lowered her blood pressure further. She monitors her heart rate with a smartwatch.  She has a history of high cholesterol and was previously tried on a statin, which she could not tolerate. She follows up with a hematologist for iron levels.  She has a history of reflux, which has resolved. She also has a history of headaches, but no current issues with migraines.  She mentions a stress  fracture in her right leg from a couple of years ago, which has recently started to feel similar to the previous injury. She describes the pain as sensitive to touch and occurring when walking.  She has a history of elevated liver enzymes, which have been monitored with liver biopsies showing no significant issues. She avoids alcohol and Tylenol  to prevent liver irritation.  She is on a regimented eating and drinking plan post-surgery and follows up with the bariatric team.      Tachycardia: - Management: Carvedilol  3.125 mg BID.   GERD, elevated LFTs: - Following [facility] - Reflux resolved post-surgery - Liver enzymes keep elevating, she had a biopsy last month that was normal - they are hoping it is surgery related and will balance out, but she follows with specialist every 2-3 months.    History of Obesity, Roux-en-Y bypass with subtotal gastrectomy 2024 (weight 178 lbs at that time): - She has done very well, hyperglycemia resolved (no longer following with endocrinology) - Following with [facility] Wt Readings from Last 3 Encounters:  06/08/24 110 lb (49.9 kg)  04/05/24 111 lb (50.3 kg)  03/11/24 114 lb 3.2 oz (51.8 kg)         Outpatient Medications Prior to Visit  Medication Sig   carvedilol  (COREG ) 3.125 MG tablet Take 1 tablet (3.125 mg total) by mouth 2 (two) times daily with a meal.   EPINEPHrine  0.3 mg/0.3 mL IJ SOAJ injection Inject 0.3 mg into the muscle as needed for anaphylaxis.   Multiple Vitamin (MULTIVITAMIN) capsule Take 1 capsule by mouth daily.   [  DISCONTINUED] ondansetron  (ZOFRAN -ODT) 8 MG disintegrating tablet DISSOLVE ONE TABLET BY MOUTH EVERY 8 HOURS AS NEEDED FOR NAUSEA AND VOMITING   [DISCONTINUED] pantoprazole (PROTONIX) 40 MG tablet Take 1 tablet by mouth daily.   No facility-administered medications prior to visit.   Past Medical History:  Diagnosis Date   Abnormal Pap smear of cervix    h/o colposcopy/biopsy   Allergic rhinitis  08/13/2014   Allergy    Allergy with anaphylaxis due to peanuts 08/13/2014   Anal skin tag 04/12/2021   Asthma    HX   Benign neoplasm of colon 10/03/2009   Bilious vomiting with nausea 01/01/2022   Breast cancer (HCC)    Cancer (HCC) 1996-1997   ovarian   Chronic diarrhea 10/01/2021   Diabetes (HCC) 09/04/2015   Diabetes mellitus without complication (HCC) 07/2015   diagnosed by PCP, Dr. Raiford    Diabetic gastroparesis (HCC) 02/03/2022   Diarrhea post cholecystectomy since 1999 08/13/2014   Dyslipidemia 07/31/2020   Esophageal dysphagia 01/01/2022   Family history of breast cancer 01/06/2012   Family history of malignant neoplasm of ovary 01/06/2012   Fascial hernia 11/29/2020   FHx: colon cancer  father  08/13/2014   Gastroesophageal reflux disease without esophagitis 08/13/2014   GERD (gastroesophageal reflux disease)    Headache, migraine 08/02/2012   Overview:   IMO Load 2016 R1.3   Hyperlipemia    on medication    Increased risk of breast cancer 07/24/2022   Irritable bowel syndrome with diarrhea 01/01/2022   Nausea 12/21/2013   PONV (postoperative nausea and vomiting)    Rectal bleeding 09/20/2015   Rectal pain 09/20/2015   Right shoulder pain 12/18/2016   Right upper quadrant pain 11/03/2006   S/P endometrial ablation 08/13/2014   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   Overview:   In 1996, found while pregnant     In 1996, found while pregnant  Overview:   Overview:   In 1996, found while pregnant   Tachycardia 08/05/2017   Type 2 diabetes mellitus without complication, without long-term current use of insulin (HCC) 07/31/2020   Wellness examination 11/21/2015   Wrist sprain, left, subsequent encounter 08/14/2020    Past Surgical History:  Procedure Laterality Date   APPENDECTOMY     age [age]   BREAST EXCISIONAL BIOPSY Left 08/2019   BREAST LUMPECTOMY Left 2008   left breast   CHOLECYSTECTOMY  2000   ENDOMETRIAL ABLATION  2009   GASTRECTOMY   06/08/2023   LAPAROSCOPIC SALPINGO OOPHERECTOMY Left 1996   with right tubal ligation   ovarian cancer Left 1996   RADIOACTIVE SEED GUIDED EXCISIONAL BREAST BIOPSY Left 09/13/2019   Procedure: RADIOACTIVE SEED GUIDED EXCISIONAL LEFT BREAST BIOPSY;  Surgeon: Ebbie Cough, MD;  Location:  SUR[facility]ERY [facility];  Service: General;  Laterality: Left;   TUBAL LIGATION       Family History  Problem Relation Age of Onset   Diabetes Mother    Colon cancer Father 75   Breast cancer Maternal Grandmother    Breast cancer Maternal Aunt    Ovarian cancer Maternal Aunt    Ovarian cancer Cousin    Colon cancer Cousin    Melanoma Cousin    Cancer Other        breast and ovarian-maternal side    Social History   Socioeconomic History   Marital status: Married    Spouse name: Not on file   Number of children: Not on file   Years of education: Not on file  Highest education level: Master's degree (e.g., MA, MS, MEng, MEd, MSW, MBA)  Occupational History   Not on file  Tobacco Use   Smoking status: Never   Smokeless tobacco: Never   Tobacco comments:    NEVER USED TOBACCO  Vaping Use   Vaping status: Never Used  Substance and Sexual Activity   Alcohol use: Not Currently    Alcohol/week: 1.0 standard drink of alcohol    Types: 1 Standard drinks or equivalent per week   Drug use: No   Sexual activity: Yes    Partners: Male    Birth control/protection: Surgical  Other Topics Concern   Not on file  Social History Narrative   ** Merged History Encounter **       Social Drivers of Health   Financial Resource Strain: Low Risk  (06/01/2024)   Overall Financial Resource Strain (CARDIA)    Difficulty of Paying Living Expenses: Not hard at all  Food Insecurity: No Food Insecurity (06/01/2024)   Hunger Vital Sign    Worried About Running Out of Food in the Last Year: Never true    Ran Out of Food in the Last Year: Never true  Transportation Needs: No Transportation Needs  (06/01/2024)   PRAPARE - Administrator, Civil Service (Medical): No    Lack of Transportation (Non-Medical): No  Physical Activity: Sufficiently Active (06/01/2024)   Exercise Vital Sign    Days of Exercise per Week: 6 days    Minutes of Exercise per Session: 60 min  Stress: No Stress Concern Present (06/01/2024)   [employer] of Occupational Health - Occupational Stress Questionnaire    Feeling of Stress: Not at all  Social Connections: Moderately Integrated (06/01/2024)   Social Connection and Isolation Panel    Frequency of Communication with Friends and Family: More than three times a week    Frequency of Social Gatherings with Friends and Family: Three times a week    Attends Religious Services: More than 4 times per year    Active Member of Clubs or Organizations: No    Attends Banker Meetings: Not on file    Marital Status: Married       ROS All review of systems negative except what is listed in the HPI    Objective     BP (!) 89/69   Pulse 84   Ht 5' 4 (1.626 m)   Wt 110 lb (49.9 kg)   LMP 04/27/2017 Comment: spotting  SpO2 100%   BMI 18.88 kg/m   Physical Exam Vitals reviewed.  Constitutional:      Appearance: Normal appearance.  Cardiovascular:     Rate and Rhythm: Normal rate and regular rhythm.     Heart sounds: Normal heart sounds.  Pulmonary:     Effort: Pulmonary effort is normal.     Breath sounds: Normal breath sounds.  Musculoskeletal:        General: No swelling or deformity.  Skin:    General: Skin is warm and dry.  Neurological:     Mental Status: She is alert and oriented to person, place, and time.  Psychiatric:        Mood and Affect: Mood normal.        Behavior: Behavior normal.        Thought Content: Thought content normal.        Judgment: Judgment normal.        Assessment & Plan:     Problem List Items Addressed  This Visit       Active Problems   Tachycardia   Tachycardia and  hypotension post-bariatric surgery. Dizziness likely related to hypotension. Discussed potential to reduce carvedilol  dosage to manage blood pressure and dizziness. Heart rate has been fine post-surgery.  - Halve carvedilol  dosage. - Monitor blood pressure at home. - Consider reducing carvedilol  to once daily or as needed if heart rate remains controlled. - Ensure adequate hydration. - Previous cardiac workup negative.       Type 2 diabetes mellitus without complication, without long-term current use of insulin (HCC)   Reports hyperglycemia is resolved post-gastric surgery. Will recheck A1c to confirm.       Relevant Orders   Microalbumin / creatinine urine ratio   Lipid panel   Comprehensive metabolic panel with GFR   HgB J8r   DG Tibia/Fibula Right (Completed)   Nausea   Occasionally needs Zofran        Relevant Medications   ondansetron  (ZOFRAN -ODT) 8 MG disintegrating tablet   Elevated liver enzymes   Elevated liver enzymes under GI management. Recent liver biopsy showed no cirrhosis.  - Ordered metabolic panel to check liver enzymes. Will forward results to her specialist. - Continue follow-up with GI specialist.      Other Visit Diagnoses       Encounter for medical examination to establish care          Right leg pain    Right leg pain with history of stress fracture. Differential includes muscle strain versus stress fracture. - Ordered x-ray of right leg. - Advised use of compression sleeves and boot if pain persists. - If x-ray is positive, will refer to specialist.    Relevant Orders   DG Tibia/Fibula Right (Completed)     Immunization due       Relevant Orders   Pneumococcal conjugate vaccine 20-valent (Prevnar 20) (Completed)            Return for physical due April 2026.  Waddell KATHEE Mon, NP  I,Emily Lagle,acting as a scribe for Waddell KATHEE Mon, NP.,have documented all relevant documentation on the behalf of Waddell KATHEE Mon, NP.  I, Waddell KATHEE Mon, NP,  have reviewed all documentation for this visit. The documentation on 06/08/2024 for the exam, diagnosis, procedures, and orders are all accurate and complete.

## 2024-06-08 NOTE — Assessment & Plan Note (Signed)
 Tachycardia and hypotension post-bariatric surgery. Dizziness likely related to hypotension. Discussed potential to reduce carvedilol  dosage to manage blood pressure and dizziness. Heart rate has been fine post-surgery.  - Halve carvedilol  dosage. - Monitor blood pressure at home. - Consider reducing carvedilol  to once daily or as needed if heart rate remains controlled. - Ensure adequate hydration. - Previous cardiac workup negative.

## 2024-06-08 NOTE — Assessment & Plan Note (Signed)
 Reports hyperglycemia is resolved post-gastric surgery. Will recheck A1c to confirm.

## 2024-06-09 LAB — HEMOGLOBIN A1C: Hgb A1c MFr Bld: 6.7 % — ABNORMAL HIGH (ref 4.6–6.5)

## 2024-06-09 LAB — LIPID PANEL
Cholesterol: 182 mg/dL (ref <200)
HDL: 52 mg/dL (ref >=50)
LDL Cholesterol (Calc): 103 mg/dL — ABNORMAL HIGH
Non-HDL Cholesterol (Calc): 130 mg/dL — ABNORMAL HIGH (ref <130)
Total CHOL/HDL Ratio: 3.5 (calc) (ref <5.0)
Triglycerides: 152 mg/dL — ABNORMAL HIGH (ref <150)

## 2024-06-22 LAB — GENECONNECT MOLECULAR SCREEN: Genetic Analysis Overall Interpretation: NEGATIVE

## 2024-06-28 ENCOUNTER — Ambulatory Visit: Admitting: Hematology & Oncology

## 2024-06-28 ENCOUNTER — Inpatient Hospital Stay

## 2024-06-29 ENCOUNTER — Encounter: Payer: Self-pay | Admitting: Family Medicine

## 2024-07-06 ENCOUNTER — Other Ambulatory Visit: Payer: Self-pay | Admitting: Family Medicine

## 2024-07-07 ENCOUNTER — Other Ambulatory Visit: Payer: Self-pay

## 2024-07-07 ENCOUNTER — Inpatient Hospital Stay: Attending: Hematology & Oncology

## 2024-07-07 ENCOUNTER — Encounter: Payer: Self-pay | Admitting: Hematology & Oncology

## 2024-07-07 ENCOUNTER — Inpatient Hospital Stay: Admitting: Hematology & Oncology

## 2024-07-07 ENCOUNTER — Ambulatory Visit: Payer: Self-pay | Admitting: Hematology & Oncology

## 2024-07-07 VITALS — BP 104/70 | HR 85 | Temp 98.7°F | Resp 16 | Ht 64.0 in | Wt 110.0 lb

## 2024-07-07 DIAGNOSIS — K219 Gastro-esophageal reflux disease without esophagitis: Secondary | ICD-10-CM

## 2024-07-07 DIAGNOSIS — M84363S Stress fracture, right fibula, sequela: Secondary | ICD-10-CM

## 2024-07-07 DIAGNOSIS — N6092 Unspecified benign mammary dysplasia of left breast: Secondary | ICD-10-CM | POA: Insufficient documentation

## 2024-07-07 DIAGNOSIS — N62 Hypertrophy of breast: Secondary | ICD-10-CM | POA: Insufficient documentation

## 2024-07-07 DIAGNOSIS — Z794 Long term (current) use of insulin: Secondary | ICD-10-CM

## 2024-07-07 DIAGNOSIS — E119 Type 2 diabetes mellitus without complications: Secondary | ICD-10-CM | POA: Diagnosis not present

## 2024-07-07 DIAGNOSIS — D271 Benign neoplasm of left ovary: Secondary | ICD-10-CM

## 2024-07-07 LAB — CMP (CANCER CENTER ONLY)
ALT: 192 U/L — ABNORMAL HIGH (ref 0–44)
AST: 112 U/L — ABNORMAL HIGH (ref 15–41)
Albumin: 4.3 g/dL (ref 3.5–5.0)
Alkaline Phosphatase: 84 U/L (ref 38–126)
Anion gap: 7 (ref 5–15)
BUN: 18 mg/dL (ref 6–20)
CO2: 32 mmol/L (ref 22–32)
Calcium: 9.6 mg/dL (ref 8.9–10.3)
Chloride: 101 mmol/L (ref 98–111)
Creatinine: 0.83 mg/dL (ref 0.44–1.00)
GFR, Estimated: >60 mL/min (ref >60)
Glucose, Bld: 124 mg/dL — ABNORMAL HIGH (ref 70–99)
Potassium: 4.3 mmol/L (ref 3.5–5.1)
Sodium: 141 mmol/L (ref 135–145)
Total Bilirubin: 0.5 mg/dL (ref 0.0–1.2)
Total Protein: 6.6 g/dL (ref 6.5–8.1)

## 2024-07-07 LAB — CBC WITH DIFFERENTIAL (CANCER CENTER ONLY)
Abs Immature Granulocytes: 0.01 K/uL (ref 0.00–0.07)
Basophils Absolute: 0 K/uL (ref 0.0–0.1)
Basophils Relative: 1 %
Eosinophils Absolute: 0.2 K/uL (ref 0.0–0.5)
Eosinophils Relative: 4 %
HCT: 41.8 % (ref 36.0–46.0)
Hemoglobin: 14.2 g/dL (ref 12.0–15.0)
Immature Granulocytes: 0 %
Lymphocytes Relative: 44 %
Lymphs Abs: 2.4 K/uL (ref 0.7–4.0)
MCH: 30.5 pg (ref 26.0–34.0)
MCHC: 34 g/dL (ref 30.0–36.0)
MCV: 89.7 fL (ref 80.0–100.0)
Monocytes Absolute: 0.4 K/uL (ref 0.1–1.0)
Monocytes Relative: 8 %
Neutro Abs: 2.4 K/uL (ref 1.7–7.7)
Neutrophils Relative %: 43 %
Platelet Count: 218 K/uL (ref 150–400)
RBC: 4.66 MIL/uL (ref 3.87–5.11)
RDW: 13.2 % (ref 11.5–15.5)
WBC Count: 5.4 K/uL (ref 4.0–10.5)
nRBC: 0 % (ref 0.0–0.2)

## 2024-07-07 LAB — IRON AND IRON BINDING CAPACITY (CC-WL,HP ONLY)
Iron: 126 ug/dL (ref 28–170)
Saturation Ratios: 36 % — ABNORMAL HIGH (ref 10.4–31.8)
TIBC: 351 ug/dL (ref 250–450)
UIBC: 225 ug/dL

## 2024-07-07 LAB — LACTATE DEHYDROGENASE: LDH: 178 U/L (ref 105–235)

## 2024-07-07 LAB — FERRITIN: Ferritin: 92 ng/mL (ref 11–307)

## 2024-07-07 NOTE — Progress Notes (Signed)
 Hematology and Oncology Follow Up Visit  Carolyn Wilson [id number] 1970/08/07 53 y.o. 07/07/2024   Principle Diagnosis:  1. History of Sertoli-Leydig tumor of the left ovary  2. Strong family history of breast and ovarian cancer - BRCA 1/2 and p53 negative 3. 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  She she is looking pretty good.  She and her husband just got back from [place].  They are down there for Thanksgiving.  That a wonderful time.  She is still working.  She is quite busy at work.  Back in October, she had a liver biopsy done at [facility], the pathology report did not show anything in the liver biopsy.  There is no cirrhosis.  There is no fibrosis.  There is nothing that looks like hepatic steatosis.  This was all done because she was having some elevated LFTs.  She has had no change in bowel or bladder habits.  She is off diabetic medications.  She has had no problems with rashes.  There is been no bleeding.  Her son got married about 6 weeks ago.  She is very happy about this.  She is now looking forward to having some grandkids in the future.  Overall, I would have to say that her performance status is probably ECOG 1.   .    Medications:  Allergies as of 07/07/2024       Reactions   Lidocaine  Nausea And Vomiting   Peanut-containing Drug Products Anaphylaxis   Statins Other (See Comments)   Unable to tolerate   Sulfa Antibiotics Shortness Of Breath, Rash   Sulfasalazine Shortness Of Breath, Rash   Metformin  And Related Diarrhea   Reglan [metoclopramide] Tinitus   Adhesive [tape] Rash   Ciprofloxacin Rash   Elemental Sulfur Rash        Medication  List        Accurate as of July 07, 2024  8:29 AM. If you have any questions, ask your nurse or doctor.          EPINEPHrine  0.3 mg/0.3 mL Soaj injection Commonly known as: EPI-PEN Inject 0.3 mg into the muscle as needed for anaphylaxis.   multivitamin capsule Take 1 capsule by mouth daily.   ondansetron  8 MG disintegrating tablet Commonly known as: ZOFRAN -ODT Take 1 tablet (8 mg total) by mouth every 8 (eight) hours as needed for nausea or vomiting.        Allergies:  Allergies  Allergen Reactions   Lidocaine  Nausea And Vomiting   Peanut-Containing Drug Products Anaphylaxis   Statins Other (See Comments)    Unable to tolerate   Sulfa Antibiotics Shortness Of Breath and Rash   Sulfasalazine Shortness Of Breath and Rash   Metformin  And Related Diarrhea   Reglan [Metoclopramide] Tinitus   Adhesive [Tape] Rash   Ciprofloxacin Rash   Elemental Sulfur Rash    Past Medical History, Surgical history, Social history, and Family History were reviewed and updated.  Review of Systems: Review of Systems  Constitutional: Negative.   HENT: Negative.    Eyes: Negative.   Respiratory: Negative.    Cardiovascular: Negative.   Gastrointestinal: Negative.   Genitourinary: Negative.  Musculoskeletal: Negative.   Skin: Negative.   Neurological: Negative.   Endo/Heme/Allergies: Negative.   Psychiatric/Behavioral: Negative.     Carolyn Wilson   Physical Exam:  Vital signs show temperature 98.7.  Pulse 85.  Blood pressure 104/70.  Weight is 110 pounds.     Wt Readings from Last 3 Encounters:  07/07/24 110 lb (49.9 kg)  06/08/24 110 lb (49.9 kg)  04/05/24 111 lb (50.3 kg)    Physical Exam Vitals reviewed.  Constitutional:      Comments: Her breast exam shows right breast with no masses, edema or erythema. She has the biopsy site at about the 10 o'clock position on the right breast. There is no erythema or ecchymosis associated with this. There is no right axillary adenopathy.   Left breast shows the lumpectomy scar at the edge of the areola at 3:00.  There is no erythema or warmth.  There is no discharge noted.  There is no tenderness to palpation.  There is no left axillary adenopathy.  HENT:     Head: Normocephalic and atraumatic.  Eyes:     Pupils: Pupils are equal, round, and reactive to light.  Cardiovascular:     Rate and Rhythm: Normal rate and regular rhythm.     Heart sounds: Normal heart sounds.  Pulmonary:     Effort: Pulmonary effort is normal.     Breath sounds: Normal breath sounds.  Abdominal:     General: Bowel sounds are normal.     Palpations: Abdomen is soft.     Comments: Abdominal exam is soft.  She has a surgical scar in the lower abdomen.  She has a special dressing on this.  She has no fluid wave.  There is no guarding or rebound tenderness.  There is no palpable hepatomegaly.  Musculoskeletal:        General: No tenderness or deformity. Normal range of motion.     Cervical back: Normal range of motion.  Lymphadenopathy:     Cervical: No cervical adenopathy.  Skin:    General: Skin is warm and dry.     Findings: No erythema or rash.  Neurological:     Mental Status: She is alert and oriented to person, place, and time.  Psychiatric:        Behavior: Behavior normal.        Thought Content: Thought content normal.        Judgment: Judgment normal.      Lab Results  Component Value Date   WBC 5.4 07/07/2024   HGB 14.2 07/07/2024   HCT 41.8 07/07/2024   MCV 89.7 07/07/2024   PLT 218 07/07/2024   Lab Results  Component Value Date   FERRITIN 38 04/05/2024   IRON 108 04/05/2024   TIBC 337 04/05/2024   UIBC 229 04/05/2024   IRONPCTSAT 32 (H) 04/05/2024   Lab Results  Component Value Date   RETICCTPCT 0.8 04/05/2024   RBC 4.66 07/07/2024   No results found for: KPAFRELGTCHN, LAMBDASER, KAPLAMBRATIO No results found for: IGGSERUM, IGA, IGMSERUM No results found for: STEPHANY CARLOTA BENSON MARKEL EARLA JOANNIE DOC VICK, SPEI   Chemistry      Component Value Date/Time   NA 138 06/08/2024 1032   NA 142 11/01/2019 0902   NA 144 03/11/2017 0822   K 4.4 06/08/2024 1032   K 4.5 03/11/2017 0822   CL 99 06/08/2024 1032   CL 104 03/11/2017 0822   CO2 36 (H) 06/08/2024 1032   CO2 29 03/11/2017  9177   BUN 21 06/08/2024 1032   BUN 14 11/01/2019 0902   BUN 9 03/11/2017 0822   CREATININE 0.80 06/08/2024 1032   CREATININE 0.91 04/05/2024 0740   CREATININE 0.82 04/05/2020 0732      Component Value Date/Time   CALCIUM  9.8 06/08/2024 1032   CALCIUM  9.2 03/11/2017 0822   ALKPHOS 67 06/08/2024 1032   ALKPHOS 65 03/11/2017 0822   AST 65 (H) 06/08/2024 1032   AST 96 (H) 04/05/2024 0740   ALT 108 (H) 06/08/2024 1032   ALT 181 (H) 04/05/2024 0740   ALT 25 03/11/2017 0822   BILITOT 0.5 06/08/2024 1032   BILITOT 0.4 04/05/2024 0740       Impression and Plan: Ms. Charlet is a 53 yo post-menopausal female with a 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   She was diagnosed with stromal hyperplasia of the right breast and ductal hyperplasia of the left breast in December 2020.    She had a left breast lumpectomy with radioactive seed placement in February 2021.   She has had a similar clinical problem in the right breast.  She had a biopsy back in August 2021.    I know that this has been a tough year for her.  Hopefully, 2026 will be much easier on her.  She actually looks quite good.  She had a wonderful time at her son's wedding.  We will go ahead and plan to get her back in 4 months now.  I think we get her through the Winter.    Maude JONELLE Crease, MD 12/18/20258:29 AM

## 2024-07-08 ENCOUNTER — Encounter: Payer: Self-pay | Admitting: *Deleted

## 2024-07-08 ENCOUNTER — Telehealth: Payer: Self-pay | Admitting: Cardiology

## 2024-07-08 NOTE — Telephone Encounter (Signed)
 Pt c/o BP issue: STAT if pt c/o blurred vision, one-sided weakness or slurred speech.  STAT if BP is GREATER than 180/120 TODAY.  STAT if BP is LESS than 90/60 and SYMPTOMATIC TODAY  1. What is your BP concern? Low   2. Have you taken any BP medication today? no  3. What are your last 5 BP readings?  73/54 75/57 64/50  79/63 73/60  4. Are you having any other symptoms (ex. Dizziness, headache, blurred vision, passed out)? Dizziness when standing a few days ago.

## 2024-07-11 NOTE — Telephone Encounter (Signed)
 Spoke with pt who states that her BP remains low and her PCP took her off all heart medications 3 weeks ago but BP remains low. Advised to add salt to diet and increase fluids. Appointment made as requested.

## 2024-08-08 ENCOUNTER — Ambulatory Visit

## 2024-09-14 ENCOUNTER — Ambulatory Visit: Admitting: Cardiology

## 2024-11-04 ENCOUNTER — Inpatient Hospital Stay

## 2024-11-04 ENCOUNTER — Inpatient Hospital Stay: Admitting: Hematology & Oncology
